# Patient Record
Sex: Female | Born: 1945 | Race: Black or African American | Hispanic: No | Marital: Single | Smoking: Never smoker
Health system: Southern US, Community
[De-identification: ages and names within clinical notes are randomized; demographics above are authoritative.]

## PROBLEM LIST (undated history)

## (undated) DIAGNOSIS — F419 Anxiety disorder, unspecified: Secondary | ICD-10-CM

## (undated) DIAGNOSIS — G8929 Other chronic pain: Secondary | ICD-10-CM

## (undated) DIAGNOSIS — C679 Malignant neoplasm of bladder, unspecified: Secondary | ICD-10-CM

## (undated) DIAGNOSIS — I10 Essential (primary) hypertension: Secondary | ICD-10-CM

## (undated) DIAGNOSIS — R519 Headache, unspecified: Secondary | ICD-10-CM

## (undated) DIAGNOSIS — F32A Depression, unspecified: Secondary | ICD-10-CM

## (undated) DIAGNOSIS — E7800 Pure hypercholesterolemia, unspecified: Secondary | ICD-10-CM

## (undated) DIAGNOSIS — H9192 Unspecified hearing loss, left ear: Secondary | ICD-10-CM

## (undated) DIAGNOSIS — L300 Nummular dermatitis: Secondary | ICD-10-CM

## (undated) DIAGNOSIS — J42 Unspecified chronic bronchitis: Secondary | ICD-10-CM

## (undated) DIAGNOSIS — R51 Headache: Secondary | ICD-10-CM

## (undated) DIAGNOSIS — M545 Low back pain, unspecified: Secondary | ICD-10-CM

## (undated) DIAGNOSIS — F329 Major depressive disorder, single episode, unspecified: Secondary | ICD-10-CM

## (undated) DIAGNOSIS — C4490 Unspecified malignant neoplasm of skin, unspecified: Secondary | ICD-10-CM

## (undated) DIAGNOSIS — Z87442 Personal history of urinary calculi: Secondary | ICD-10-CM

## (undated) DIAGNOSIS — Z9889 Other specified postprocedural states: Secondary | ICD-10-CM

## (undated) DIAGNOSIS — I82409 Acute embolism and thrombosis of unspecified deep veins of unspecified lower extremity: Secondary | ICD-10-CM

## (undated) DIAGNOSIS — J45909 Unspecified asthma, uncomplicated: Secondary | ICD-10-CM

## (undated) DIAGNOSIS — E785 Hyperlipidemia, unspecified: Secondary | ICD-10-CM

## (undated) DIAGNOSIS — M1990 Unspecified osteoarthritis, unspecified site: Secondary | ICD-10-CM

## (undated) DIAGNOSIS — K219 Gastro-esophageal reflux disease without esophagitis: Secondary | ICD-10-CM

## (undated) HISTORY — PX: BLADDER TUMOR EXCISION: SHX238

## (undated) HISTORY — PX: REPLACEMENT TOTAL KNEE BILATERAL: SUR1225

## (undated) HISTORY — DX: Other specified postprocedural states: Z98.890

## (undated) HISTORY — PX: APPENDECTOMY: SHX54

## (undated) HISTORY — PX: JOINT REPLACEMENT: SHX530

## (undated) HISTORY — PX: ABDOMINAL HYSTERECTOMY: SHX81

## (undated) HISTORY — PX: KNEE ARTHROSCOPY: SHX127

## (undated) HISTORY — PX: SKIN CANCER EXCISION: SHX779

## (undated) HISTORY — PX: BACK SURGERY: SHX140

## (undated) HISTORY — DX: Hyperlipidemia, unspecified: E78.5

## (undated) HISTORY — PX: BREAST EXCISIONAL BIOPSY: SUR124

## (undated) HISTORY — PX: BREAST BIOPSY: SHX20

## (undated) HISTORY — PX: TRANSTHORACIC ECHOCARDIOGRAM: SHX275

## (undated) HISTORY — PX: LUMBAR DISC SURGERY: SHX700

## (undated) HISTORY — PX: LEFT HEART CATH AND CORONARY ANGIOGRAPHY: CATH118249

## (undated) HISTORY — PX: CATARACT EXTRACTION: SUR2

## (undated) HISTORY — PX: REVISION TOTAL KNEE ARTHROPLASTY: SUR1280

---

## 1998-02-02 ENCOUNTER — Ambulatory Visit (HOSPITAL_COMMUNITY): Admission: RE | Admit: 1998-02-02 | Discharge: 1998-02-02 | Payer: Self-pay | Admitting: Urology

## 1998-04-22 ENCOUNTER — Ambulatory Visit (HOSPITAL_COMMUNITY): Admission: RE | Admit: 1998-04-22 | Discharge: 1998-04-22 | Payer: Self-pay | Admitting: Cardiology

## 1998-06-07 ENCOUNTER — Ambulatory Visit (HOSPITAL_COMMUNITY): Admission: RE | Admit: 1998-06-07 | Discharge: 1998-06-07 | Payer: Self-pay | Admitting: Urology

## 1998-08-04 ENCOUNTER — Encounter: Admission: RE | Admit: 1998-08-04 | Discharge: 1998-08-04 | Payer: Self-pay | Admitting: Family Medicine

## 1998-08-05 ENCOUNTER — Encounter: Admission: RE | Admit: 1998-08-05 | Discharge: 1998-08-05 | Payer: Self-pay | Admitting: Family Medicine

## 1998-08-12 ENCOUNTER — Other Ambulatory Visit: Admission: RE | Admit: 1998-08-12 | Discharge: 1998-08-12 | Payer: Self-pay | Admitting: Nephrology

## 1998-10-08 ENCOUNTER — Encounter: Payer: Self-pay | Admitting: Urology

## 1998-10-12 ENCOUNTER — Ambulatory Visit (HOSPITAL_COMMUNITY): Admission: RE | Admit: 1998-10-12 | Discharge: 1998-10-12 | Payer: Self-pay | Admitting: Urology

## 1999-01-24 ENCOUNTER — Ambulatory Visit (HOSPITAL_COMMUNITY): Admission: RE | Admit: 1999-01-24 | Discharge: 1999-01-24 | Payer: Self-pay | Admitting: Nephrology

## 1999-01-24 ENCOUNTER — Encounter: Payer: Self-pay | Admitting: Nephrology

## 1999-03-29 ENCOUNTER — Ambulatory Visit (HOSPITAL_COMMUNITY): Admission: RE | Admit: 1999-03-29 | Discharge: 1999-03-29 | Payer: Self-pay | Admitting: Gastroenterology

## 1999-04-15 ENCOUNTER — Ambulatory Visit (HOSPITAL_COMMUNITY): Admission: RE | Admit: 1999-04-15 | Discharge: 1999-04-15 | Payer: Self-pay | Admitting: Urology

## 1999-07-15 ENCOUNTER — Encounter (INDEPENDENT_AMBULATORY_CARE_PROVIDER_SITE_OTHER): Payer: Self-pay | Admitting: *Deleted

## 1999-07-15 ENCOUNTER — Ambulatory Visit (HOSPITAL_COMMUNITY): Admission: RE | Admit: 1999-07-15 | Discharge: 1999-07-15 | Payer: Self-pay | Admitting: Urology

## 1999-08-18 ENCOUNTER — Other Ambulatory Visit: Admission: RE | Admit: 1999-08-18 | Discharge: 1999-08-18 | Payer: Self-pay | Admitting: Nephrology

## 1999-08-25 ENCOUNTER — Encounter: Admission: RE | Admit: 1999-08-25 | Discharge: 1999-08-25 | Payer: Self-pay | Admitting: Nephrology

## 1999-08-25 ENCOUNTER — Encounter: Payer: Self-pay | Admitting: Nephrology

## 1999-09-05 HISTORY — PX: MEDIAL PARTIAL KNEE REPLACEMENT: SHX5965

## 1999-09-15 ENCOUNTER — Encounter: Admission: RE | Admit: 1999-09-15 | Discharge: 1999-09-15 | Payer: Self-pay | Admitting: Nephrology

## 1999-09-15 ENCOUNTER — Encounter: Payer: Self-pay | Admitting: Nephrology

## 1999-10-14 ENCOUNTER — Ambulatory Visit: Admission: RE | Admit: 1999-10-14 | Discharge: 1999-10-14 | Payer: Self-pay | Admitting: Urology

## 1999-10-14 ENCOUNTER — Encounter: Payer: Self-pay | Admitting: Urology

## 1999-11-08 ENCOUNTER — Emergency Department (HOSPITAL_COMMUNITY): Admission: EM | Admit: 1999-11-08 | Discharge: 1999-11-08 | Payer: Self-pay | Admitting: Emergency Medicine

## 1999-11-08 ENCOUNTER — Encounter (INDEPENDENT_AMBULATORY_CARE_PROVIDER_SITE_OTHER): Payer: Self-pay | Admitting: Specialist

## 1999-11-08 ENCOUNTER — Ambulatory Visit (HOSPITAL_COMMUNITY): Admission: RE | Admit: 1999-11-08 | Discharge: 1999-11-08 | Payer: Self-pay | Admitting: Urology

## 1999-11-18 ENCOUNTER — Encounter: Admission: RE | Admit: 1999-11-18 | Discharge: 1999-11-18 | Payer: Self-pay | Admitting: Gastroenterology

## 1999-11-18 ENCOUNTER — Encounter: Payer: Self-pay | Admitting: Gastroenterology

## 2000-01-12 ENCOUNTER — Ambulatory Visit (HOSPITAL_COMMUNITY): Admission: RE | Admit: 2000-01-12 | Discharge: 2000-01-12 | Payer: Self-pay | Admitting: Gastroenterology

## 2000-01-12 ENCOUNTER — Encounter: Payer: Self-pay | Admitting: Gastroenterology

## 2000-02-20 ENCOUNTER — Encounter: Payer: Self-pay | Admitting: Orthopedic Surgery

## 2000-02-27 ENCOUNTER — Encounter: Admission: RE | Admit: 2000-02-27 | Discharge: 2000-02-27 | Payer: Self-pay | Admitting: Nephrology

## 2000-02-27 ENCOUNTER — Encounter: Payer: Self-pay | Admitting: Nephrology

## 2000-02-28 ENCOUNTER — Inpatient Hospital Stay (HOSPITAL_COMMUNITY): Admission: RE | Admit: 2000-02-28 | Discharge: 2000-03-04 | Payer: Self-pay | Admitting: Orthopedic Surgery

## 2000-04-02 ENCOUNTER — Encounter: Admission: RE | Admit: 2000-04-02 | Discharge: 2000-07-01 | Payer: Self-pay | Admitting: Orthopedic Surgery

## 2000-05-04 ENCOUNTER — Encounter: Admission: RE | Admit: 2000-05-04 | Discharge: 2000-05-04 | Payer: Self-pay | Admitting: Nephrology

## 2000-05-04 ENCOUNTER — Encounter: Payer: Self-pay | Admitting: Nephrology

## 2000-06-12 ENCOUNTER — Encounter (INDEPENDENT_AMBULATORY_CARE_PROVIDER_SITE_OTHER): Payer: Self-pay | Admitting: *Deleted

## 2000-06-12 ENCOUNTER — Ambulatory Visit (HOSPITAL_COMMUNITY): Admission: RE | Admit: 2000-06-12 | Discharge: 2000-06-12 | Payer: Self-pay | Admitting: Urology

## 2000-06-12 ENCOUNTER — Encounter (INDEPENDENT_AMBULATORY_CARE_PROVIDER_SITE_OTHER): Payer: Self-pay | Admitting: Specialist

## 2000-08-30 ENCOUNTER — Other Ambulatory Visit: Admission: RE | Admit: 2000-08-30 | Discharge: 2000-08-30 | Payer: Self-pay | Admitting: Nephrology

## 2000-09-11 ENCOUNTER — Emergency Department (HOSPITAL_COMMUNITY): Admission: EM | Admit: 2000-09-11 | Discharge: 2000-09-11 | Payer: Self-pay | Admitting: Emergency Medicine

## 2000-09-11 ENCOUNTER — Encounter: Payer: Self-pay | Admitting: Emergency Medicine

## 2001-01-02 ENCOUNTER — Encounter: Payer: Self-pay | Admitting: Emergency Medicine

## 2001-01-02 ENCOUNTER — Emergency Department (HOSPITAL_COMMUNITY): Admission: EM | Admit: 2001-01-02 | Discharge: 2001-01-02 | Payer: Self-pay | Admitting: Emergency Medicine

## 2001-02-27 ENCOUNTER — Encounter: Payer: Self-pay | Admitting: Orthopedic Surgery

## 2001-02-27 ENCOUNTER — Observation Stay (HOSPITAL_COMMUNITY): Admission: RE | Admit: 2001-02-27 | Discharge: 2001-02-28 | Payer: Self-pay | Admitting: Orthopedic Surgery

## 2001-02-27 ENCOUNTER — Encounter (INDEPENDENT_AMBULATORY_CARE_PROVIDER_SITE_OTHER): Payer: Self-pay | Admitting: Specialist

## 2001-04-08 ENCOUNTER — Inpatient Hospital Stay (HOSPITAL_COMMUNITY): Admission: EM | Admit: 2001-04-08 | Discharge: 2001-04-10 | Payer: Self-pay | Admitting: Emergency Medicine

## 2001-04-08 ENCOUNTER — Encounter: Payer: Self-pay | Admitting: Emergency Medicine

## 2001-04-15 ENCOUNTER — Encounter: Payer: Self-pay | Admitting: Urology

## 2001-04-15 ENCOUNTER — Encounter: Admission: RE | Admit: 2001-04-15 | Discharge: 2001-04-15 | Payer: Self-pay | Admitting: Urology

## 2001-05-02 ENCOUNTER — Ambulatory Visit (HOSPITAL_BASED_OUTPATIENT_CLINIC_OR_DEPARTMENT_OTHER): Admission: RE | Admit: 2001-05-02 | Discharge: 2001-05-02 | Payer: Self-pay | Admitting: Urology

## 2001-05-02 ENCOUNTER — Encounter (INDEPENDENT_AMBULATORY_CARE_PROVIDER_SITE_OTHER): Payer: Self-pay | Admitting: *Deleted

## 2001-05-07 ENCOUNTER — Encounter: Payer: Self-pay | Admitting: Nephrology

## 2001-05-07 ENCOUNTER — Encounter: Admission: RE | Admit: 2001-05-07 | Discharge: 2001-05-07 | Payer: Self-pay | Admitting: Nephrology

## 2001-07-30 ENCOUNTER — Encounter: Payer: Self-pay | Admitting: Gastroenterology

## 2001-07-30 ENCOUNTER — Encounter: Admission: RE | Admit: 2001-07-30 | Discharge: 2001-07-30 | Payer: Self-pay | Admitting: Gastroenterology

## 2001-08-15 ENCOUNTER — Encounter: Payer: Self-pay | Admitting: Emergency Medicine

## 2001-08-15 ENCOUNTER — Emergency Department (HOSPITAL_COMMUNITY): Admission: EM | Admit: 2001-08-15 | Discharge: 2001-08-15 | Payer: Self-pay | Admitting: Emergency Medicine

## 2001-10-30 ENCOUNTER — Ambulatory Visit (HOSPITAL_COMMUNITY): Admission: RE | Admit: 2001-10-30 | Discharge: 2001-10-30 | Payer: Self-pay | Admitting: Gastroenterology

## 2001-11-18 ENCOUNTER — Encounter: Admission: RE | Admit: 2001-11-18 | Discharge: 2001-11-18 | Payer: Self-pay | Admitting: Nephrology

## 2001-11-18 ENCOUNTER — Encounter: Payer: Self-pay | Admitting: Nephrology

## 2001-11-21 ENCOUNTER — Other Ambulatory Visit: Admission: RE | Admit: 2001-11-21 | Discharge: 2001-11-21 | Payer: Self-pay | Admitting: Nephrology

## 2002-04-26 ENCOUNTER — Encounter: Payer: Self-pay | Admitting: Emergency Medicine

## 2002-04-26 ENCOUNTER — Emergency Department (HOSPITAL_COMMUNITY): Admission: EM | Admit: 2002-04-26 | Discharge: 2002-04-26 | Payer: Self-pay | Admitting: Emergency Medicine

## 2002-05-08 ENCOUNTER — Encounter: Payer: Self-pay | Admitting: Nephrology

## 2002-05-08 ENCOUNTER — Encounter: Admission: RE | Admit: 2002-05-08 | Discharge: 2002-05-08 | Payer: Self-pay | Admitting: Nephrology

## 2002-05-09 ENCOUNTER — Ambulatory Visit (HOSPITAL_COMMUNITY): Admission: RE | Admit: 2002-05-09 | Discharge: 2002-05-09 | Payer: Self-pay | Admitting: Orthopedic Surgery

## 2002-05-09 ENCOUNTER — Ambulatory Visit (HOSPITAL_COMMUNITY): Admission: RE | Admit: 2002-05-09 | Discharge: 2002-05-09 | Payer: Self-pay | Admitting: Urology

## 2002-05-09 ENCOUNTER — Encounter (INDEPENDENT_AMBULATORY_CARE_PROVIDER_SITE_OTHER): Payer: Self-pay | Admitting: Specialist

## 2002-06-03 ENCOUNTER — Encounter: Payer: Self-pay | Admitting: Gastroenterology

## 2002-06-03 ENCOUNTER — Encounter: Admission: RE | Admit: 2002-06-03 | Discharge: 2002-06-03 | Payer: Self-pay | Admitting: Gastroenterology

## 2002-06-17 ENCOUNTER — Encounter: Payer: Self-pay | Admitting: Orthopedic Surgery

## 2002-06-24 ENCOUNTER — Inpatient Hospital Stay (HOSPITAL_COMMUNITY): Admission: RE | Admit: 2002-06-24 | Discharge: 2002-06-26 | Payer: Self-pay | Admitting: Orthopedic Surgery

## 2002-06-24 ENCOUNTER — Encounter: Payer: Self-pay | Admitting: Orthopedic Surgery

## 2002-07-09 ENCOUNTER — Encounter: Admission: RE | Admit: 2002-07-09 | Discharge: 2002-10-07 | Payer: Self-pay | Admitting: Orthopedic Surgery

## 2003-05-12 ENCOUNTER — Encounter (INDEPENDENT_AMBULATORY_CARE_PROVIDER_SITE_OTHER): Payer: Self-pay

## 2003-05-12 ENCOUNTER — Ambulatory Visit (HOSPITAL_COMMUNITY): Admission: RE | Admit: 2003-05-12 | Discharge: 2003-05-12 | Payer: Self-pay | Admitting: Urology

## 2003-05-18 ENCOUNTER — Encounter: Payer: Self-pay | Admitting: Nephrology

## 2003-05-18 ENCOUNTER — Encounter: Admission: RE | Admit: 2003-05-18 | Discharge: 2003-05-18 | Payer: Self-pay | Admitting: Nephrology

## 2003-06-05 ENCOUNTER — Encounter: Payer: Self-pay | Admitting: Nephrology

## 2003-06-05 ENCOUNTER — Encounter: Admission: RE | Admit: 2003-06-05 | Discharge: 2003-06-05 | Payer: Self-pay | Admitting: Nephrology

## 2003-09-05 DIAGNOSIS — I2510 Atherosclerotic heart disease of native coronary artery without angina pectoris: Secondary | ICD-10-CM | POA: Insufficient documentation

## 2003-09-05 HISTORY — DX: Atherosclerotic heart disease of native coronary artery without angina pectoris: I25.10

## 2003-10-15 ENCOUNTER — Encounter: Admission: RE | Admit: 2003-10-15 | Discharge: 2003-10-15 | Payer: Self-pay | Admitting: Nephrology

## 2004-01-29 ENCOUNTER — Encounter (INDEPENDENT_AMBULATORY_CARE_PROVIDER_SITE_OTHER): Payer: Self-pay | Admitting: Specialist

## 2004-01-29 ENCOUNTER — Ambulatory Visit (HOSPITAL_COMMUNITY): Admission: RE | Admit: 2004-01-29 | Discharge: 2004-01-29 | Payer: Self-pay | Admitting: Urology

## 2004-05-25 ENCOUNTER — Encounter: Admission: RE | Admit: 2004-05-25 | Discharge: 2004-05-25 | Payer: Self-pay | Admitting: Nephrology

## 2004-06-08 ENCOUNTER — Ambulatory Visit (HOSPITAL_COMMUNITY): Admission: RE | Admit: 2004-06-08 | Discharge: 2004-06-08 | Payer: Self-pay | Admitting: Nephrology

## 2004-07-18 ENCOUNTER — Encounter (INDEPENDENT_AMBULATORY_CARE_PROVIDER_SITE_OTHER): Payer: Self-pay | Admitting: *Deleted

## 2004-07-18 ENCOUNTER — Ambulatory Visit (HOSPITAL_COMMUNITY): Admission: RE | Admit: 2004-07-18 | Discharge: 2004-07-18 | Payer: Self-pay | Admitting: Urology

## 2004-08-03 ENCOUNTER — Ambulatory Visit (HOSPITAL_COMMUNITY): Admission: RE | Admit: 2004-08-03 | Discharge: 2004-08-03 | Payer: Self-pay | Admitting: Cardiology

## 2005-01-16 ENCOUNTER — Encounter (INDEPENDENT_AMBULATORY_CARE_PROVIDER_SITE_OTHER): Payer: Self-pay | Admitting: *Deleted

## 2005-01-16 ENCOUNTER — Ambulatory Visit (HOSPITAL_COMMUNITY): Admission: RE | Admit: 2005-01-16 | Discharge: 2005-01-16 | Payer: Self-pay | Admitting: Urology

## 2005-01-19 ENCOUNTER — Encounter: Admission: RE | Admit: 2005-01-19 | Discharge: 2005-01-19 | Payer: Self-pay | Admitting: Nephrology

## 2005-02-21 ENCOUNTER — Encounter: Admission: RE | Admit: 2005-02-21 | Discharge: 2005-02-21 | Payer: Self-pay | Admitting: Nephrology

## 2005-05-14 ENCOUNTER — Emergency Department (HOSPITAL_COMMUNITY): Admission: EM | Admit: 2005-05-14 | Discharge: 2005-05-14 | Payer: Self-pay | Admitting: Emergency Medicine

## 2005-05-18 ENCOUNTER — Encounter: Admission: RE | Admit: 2005-05-18 | Discharge: 2005-05-18 | Payer: Self-pay | Admitting: Nephrology

## 2005-05-19 ENCOUNTER — Encounter: Admission: RE | Admit: 2005-05-19 | Discharge: 2005-05-19 | Payer: Self-pay | Admitting: Nephrology

## 2005-05-30 ENCOUNTER — Encounter: Admission: RE | Admit: 2005-05-30 | Discharge: 2005-05-30 | Payer: Self-pay | Admitting: Nephrology

## 2005-06-03 ENCOUNTER — Emergency Department (HOSPITAL_COMMUNITY): Admission: EM | Admit: 2005-06-03 | Discharge: 2005-06-03 | Payer: Self-pay | Admitting: Emergency Medicine

## 2005-06-08 ENCOUNTER — Emergency Department (HOSPITAL_COMMUNITY): Admission: EM | Admit: 2005-06-08 | Discharge: 2005-06-08 | Payer: Self-pay | Admitting: Emergency Medicine

## 2005-08-02 ENCOUNTER — Ambulatory Visit (HOSPITAL_COMMUNITY): Admission: RE | Admit: 2005-08-02 | Discharge: 2005-08-02 | Payer: Self-pay | Admitting: Neurology

## 2005-10-16 ENCOUNTER — Encounter: Admission: RE | Admit: 2005-10-16 | Discharge: 2005-10-16 | Payer: Self-pay | Admitting: Nephrology

## 2005-11-07 ENCOUNTER — Inpatient Hospital Stay (HOSPITAL_COMMUNITY): Admission: RE | Admit: 2005-11-07 | Discharge: 2005-11-12 | Payer: Self-pay | Admitting: Orthopedic Surgery

## 2005-12-11 ENCOUNTER — Encounter: Admission: RE | Admit: 2005-12-11 | Discharge: 2006-03-11 | Payer: Self-pay | Admitting: Orthopedic Surgery

## 2006-03-12 ENCOUNTER — Encounter: Admission: RE | Admit: 2006-03-12 | Discharge: 2006-06-10 | Payer: Self-pay | Admitting: Orthopedic Surgery

## 2006-04-27 ENCOUNTER — Encounter: Admission: RE | Admit: 2006-04-27 | Discharge: 2006-04-27 | Payer: Self-pay | Admitting: Nephrology

## 2006-06-01 ENCOUNTER — Encounter: Admission: RE | Admit: 2006-06-01 | Discharge: 2006-06-01 | Payer: Self-pay | Admitting: Nephrology

## 2006-06-14 ENCOUNTER — Encounter: Admission: RE | Admit: 2006-06-14 | Discharge: 2006-06-14 | Payer: Self-pay | Admitting: *Deleted

## 2006-06-18 ENCOUNTER — Encounter: Admission: RE | Admit: 2006-06-18 | Discharge: 2006-06-18 | Payer: Self-pay | Admitting: *Deleted

## 2006-06-25 ENCOUNTER — Other Ambulatory Visit: Admission: RE | Admit: 2006-06-25 | Discharge: 2006-06-25 | Payer: Self-pay | Admitting: Nephrology

## 2006-07-23 ENCOUNTER — Encounter: Admission: RE | Admit: 2006-07-23 | Discharge: 2006-07-23 | Payer: Self-pay | Admitting: Orthopedic Surgery

## 2006-08-08 ENCOUNTER — Ambulatory Visit (HOSPITAL_COMMUNITY): Admission: RE | Admit: 2006-08-08 | Discharge: 2006-08-08 | Payer: Self-pay | Admitting: Gastroenterology

## 2006-08-08 ENCOUNTER — Encounter (INDEPENDENT_AMBULATORY_CARE_PROVIDER_SITE_OTHER): Payer: Self-pay | Admitting: Specialist

## 2006-10-22 ENCOUNTER — Encounter (INDEPENDENT_AMBULATORY_CARE_PROVIDER_SITE_OTHER): Payer: Self-pay | Admitting: *Deleted

## 2006-10-22 ENCOUNTER — Ambulatory Visit (HOSPITAL_COMMUNITY): Admission: RE | Admit: 2006-10-22 | Discharge: 2006-10-22 | Payer: Self-pay | Admitting: Urology

## 2007-03-22 ENCOUNTER — Encounter: Admission: RE | Admit: 2007-03-22 | Discharge: 2007-03-22 | Payer: Self-pay | Admitting: Gastroenterology

## 2007-04-01 ENCOUNTER — Ambulatory Visit (HOSPITAL_COMMUNITY): Admission: RE | Admit: 2007-04-01 | Discharge: 2007-04-01 | Payer: Self-pay | Admitting: Gastroenterology

## 2007-06-04 ENCOUNTER — Encounter: Admission: RE | Admit: 2007-06-04 | Discharge: 2007-06-04 | Payer: Self-pay | Admitting: Nephrology

## 2007-07-04 ENCOUNTER — Other Ambulatory Visit: Admission: RE | Admit: 2007-07-04 | Discharge: 2007-07-04 | Payer: Self-pay | Admitting: Nephrology

## 2007-09-05 HISTORY — PX: PARTIAL KNEE ARTHROPLASTY: SHX2174

## 2007-10-11 ENCOUNTER — Encounter: Admission: RE | Admit: 2007-10-11 | Discharge: 2007-10-11 | Payer: Self-pay | Admitting: Nephrology

## 2007-11-14 ENCOUNTER — Ambulatory Visit (HOSPITAL_COMMUNITY): Admission: RE | Admit: 2007-11-14 | Discharge: 2007-11-14 | Payer: Self-pay | Admitting: Gastroenterology

## 2008-02-11 ENCOUNTER — Encounter: Admission: RE | Admit: 2008-02-11 | Discharge: 2008-02-11 | Payer: Self-pay | Admitting: Nephrology

## 2008-05-25 ENCOUNTER — Ambulatory Visit (HOSPITAL_COMMUNITY): Admission: RE | Admit: 2008-05-25 | Discharge: 2008-05-25 | Payer: Self-pay | Admitting: Orthopedic Surgery

## 2008-06-05 ENCOUNTER — Encounter: Admission: RE | Admit: 2008-06-05 | Discharge: 2008-06-05 | Payer: Self-pay | Admitting: Nephrology

## 2008-08-13 ENCOUNTER — Inpatient Hospital Stay (HOSPITAL_COMMUNITY): Admission: RE | Admit: 2008-08-13 | Discharge: 2008-08-15 | Payer: Self-pay | Admitting: Orthopedic Surgery

## 2008-10-26 ENCOUNTER — Encounter: Admission: RE | Admit: 2008-10-26 | Discharge: 2008-10-26 | Payer: Self-pay | Admitting: Nephrology

## 2008-10-27 ENCOUNTER — Encounter: Admission: RE | Admit: 2008-10-27 | Discharge: 2008-10-27 | Payer: Self-pay | Admitting: Nephrology

## 2008-11-20 ENCOUNTER — Encounter (INDEPENDENT_AMBULATORY_CARE_PROVIDER_SITE_OTHER): Payer: Self-pay | Admitting: Urology

## 2008-11-20 ENCOUNTER — Ambulatory Visit (HOSPITAL_COMMUNITY): Admission: RE | Admit: 2008-11-20 | Discharge: 2008-11-20 | Payer: Self-pay | Admitting: Urology

## 2009-02-03 ENCOUNTER — Emergency Department (HOSPITAL_COMMUNITY): Admission: EM | Admit: 2009-02-03 | Discharge: 2009-02-03 | Payer: Self-pay | Admitting: Emergency Medicine

## 2009-02-05 ENCOUNTER — Encounter: Admission: RE | Admit: 2009-02-05 | Discharge: 2009-02-05 | Payer: Self-pay | Admitting: Orthopedic Surgery

## 2009-05-12 ENCOUNTER — Observation Stay (HOSPITAL_COMMUNITY): Admission: AD | Admit: 2009-05-12 | Discharge: 2009-05-15 | Payer: Self-pay | Admitting: Nephrology

## 2009-06-15 ENCOUNTER — Encounter: Admission: RE | Admit: 2009-06-15 | Discharge: 2009-06-15 | Payer: Self-pay | Admitting: Nephrology

## 2009-09-23 ENCOUNTER — Encounter: Admission: RE | Admit: 2009-09-23 | Discharge: 2009-09-23 | Payer: Self-pay | Admitting: Nephrology

## 2009-12-20 ENCOUNTER — Encounter: Admission: RE | Admit: 2009-12-20 | Discharge: 2009-12-20 | Payer: Self-pay | Admitting: Gastroenterology

## 2010-04-14 ENCOUNTER — Encounter: Admission: RE | Admit: 2010-04-14 | Discharge: 2010-04-14 | Payer: Self-pay | Admitting: Nephrology

## 2010-05-13 ENCOUNTER — Emergency Department (HOSPITAL_COMMUNITY): Admission: EM | Admit: 2010-05-13 | Discharge: 2010-05-13 | Payer: Self-pay | Admitting: Emergency Medicine

## 2010-06-17 ENCOUNTER — Encounter: Admission: RE | Admit: 2010-06-17 | Discharge: 2010-06-17 | Payer: Self-pay | Admitting: Nephrology

## 2010-09-25 ENCOUNTER — Encounter: Payer: Self-pay | Admitting: Nephrology

## 2010-10-12 ENCOUNTER — Ambulatory Visit (HOSPITAL_COMMUNITY)
Admission: RE | Admit: 2010-10-12 | Discharge: 2010-10-12 | Disposition: A | Discharge status: Home / Self Care | Payer: Medicare Other | Source: Ambulatory Visit | Attending: Cardiology | Admitting: Cardiology

## 2010-10-12 DIAGNOSIS — R9439 Abnormal result of other cardiovascular function study: Secondary | ICD-10-CM | POA: Insufficient documentation

## 2010-10-12 DIAGNOSIS — R079 Chest pain, unspecified: Secondary | ICD-10-CM | POA: Insufficient documentation

## 2010-10-12 LAB — BASIC METABOLIC PANEL
BUN: 8 mg/dL (ref 6–23)
CO2: 28 mEq/L (ref 19–32)
Chloride: 107 mEq/L (ref 96–112)
Creatinine, Ser: 0.79 mg/dL (ref 0.4–1.2)

## 2010-10-12 LAB — CBC
MCH: 29.6 pg (ref 26.0–34.0)
MCV: 90 fL (ref 78.0–100.0)
Platelets: 335 10*3/uL (ref 150–400)
RDW: 13.6 % (ref 11.5–15.5)

## 2010-10-19 NOTE — Procedures (Signed)
  NAMEAREANNA, GENGLER NO.:  [id number]  MEDICAL RECORD NO.:  [id number]           PATIENT TYPE:  O  LOCATION:  [facility]                         FACILITY:  [facility]  PHYSICIAN:  Ritta Slot, MD     DATE OF BIRTH:  [birth year]  DATE OF PROCEDURE:  10/12/2010 DATE OF DISCHARGE:  10/12/2010                           CARDIAC CATHETERIZATION   The is a left heart catheterization.  INDICATIONS:  [name] is a very pleasant 65 year old African American female who presents with chest discomfort and heaviness in the left chest radiating down the left with an abnormal nuclear perfusion stress test.  She was therefore brought to the cardiac cath lab for further evaluation of her coronary anatomy.  After informed consent, the patient was placed in supine position.  Her right wrist was prepped and draped in the usual sterile fashion.  Using the modified Seldinger technique, the right radial artery was accessed using a 5-French sheath.  She received 1 mg of Versed and 50 mcg of fentanyl and a cocktail of 400 mcg of nitroglycerin, 4 mg of Versed, 2 mL of lidocaine diluted down with 2 mL of saline to 10 mL cocktail. Through the 5-French sheath was placed a JL-3.5 catheter.  JL-3.5 catheter was actually used to engage into both the RCA and LCA without difficulty.  Visualization of the RCA and the LCA was obtained in AP cranial, AP caudal, RAO cranial, RAO caudal views.  The JL-3.5 catheter was then exchanged over exchange wire for a straight pigtail.  The straight pigtail was placed in LV without difficulty.  Visualization of the LV was performed using 36 mL of contrast at 12 mL per second.  FINDINGS:  AO pressure 130/75, mean 97.  LV pressure 160/-5, LVEDP of 12.  LV pullback 155/9, LVEDP of 14.  AO pullback 154/78, mean of 110. There is no gradient across the aortic valve by pullback technique.  The right coronary artery was found to be dominant giving rise to PDA  and PLA.  There is no significant disease.  The left main bifurcated into an LAD and circumflex.  There is no significant disease in the left main LAD territory or left circumflex territory.  Left ventricular function was found to be 65%, no regional wall motion abnormalities.  No mitral regurgitation.  FINAL CONCLUSIONS: 1. Normal coronary arteries. 2. Normal left ventriculogram.  Continue medical therapy, discharge home, follow with me in 2 weeks.     Ritta Slot, MD     HS/MEDQ  D:  10/12/2010  T:  10/13/2010  Job:  [id number]  Electronically Signed by Ritta Slot MD on 10/19/2010 01:47:30 PM

## 2010-12-09 LAB — URINALYSIS, ROUTINE W REFLEX MICROSCOPIC
Bilirubin Urine: NEGATIVE
Bilirubin Urine: NEGATIVE
Glucose, UA: NEGATIVE mg/dL
Specific Gravity, Urine: 1.005 (ref 1.005–1.030)
Specific Gravity, Urine: 1.011 (ref 1.005–1.030)
pH: 6.5 (ref 5.0–8.0)
pH: 7 (ref 5.0–8.0)

## 2010-12-09 LAB — LIPID PANEL
Cholesterol: 128 mg/dL (ref 0–200)
LDL Cholesterol: 69 mg/dL (ref 0–99)
Total CHOL/HDL Ratio: 2.9 RATIO

## 2010-12-09 LAB — URINE MICROSCOPIC-ADD ON

## 2010-12-09 LAB — GLUCOSE, CAPILLARY
Glucose-Capillary: 105 mg/dL — ABNORMAL HIGH (ref 70–99)
Glucose-Capillary: 132 mg/dL — ABNORMAL HIGH (ref 70–99)
Glucose-Capillary: 85 mg/dL (ref 70–99)
Glucose-Capillary: 87 mg/dL (ref 70–99)
Glucose-Capillary: 88 mg/dL (ref 70–99)
Glucose-Capillary: 89 mg/dL (ref 70–99)

## 2010-12-09 LAB — CBC
HCT: 34.4 % — ABNORMAL LOW (ref 36.0–46.0)
Hemoglobin: 11.4 g/dL — ABNORMAL LOW (ref 12.0–15.0)
MCHC: 33.3 g/dL (ref 30.0–36.0)
MCV: 92.2 fL (ref 78.0–100.0)
MCV: 92.6 fL (ref 78.0–100.0)
Platelets: 350 10*3/uL (ref 150–400)
Platelets: 356 10*3/uL (ref 150–400)
RBC: 3.73 MIL/uL — ABNORMAL LOW (ref 3.87–5.11)
WBC: 11.3 10*3/uL — ABNORMAL HIGH (ref 4.0–10.5)
WBC: 9.1 10*3/uL (ref 4.0–10.5)

## 2010-12-09 LAB — DIFFERENTIAL
Eosinophils Absolute: 0.5 10*3/uL (ref 0.0–0.7)
Eosinophils Relative: 4 % (ref 0–5)
Eosinophils Relative: 5 % (ref 0–5)
Lymphocytes Relative: 30 % (ref 12–46)
Lymphocytes Relative: 32 % (ref 12–46)
Lymphocytes Relative: 38 % (ref 12–46)
Lymphs Abs: 2.9 10*3/uL (ref 0.7–4.0)
Lymphs Abs: 3.8 10*3/uL (ref 0.7–4.0)
Lymphs Abs: 4.3 10*3/uL — ABNORMAL HIGH (ref 0.7–4.0)
Monocytes Absolute: 0.7 10*3/uL (ref 0.1–1.0)
Monocytes Relative: 6 % (ref 3–12)
Neutro Abs: 7.6 10*3/uL (ref 1.7–7.7)
Neutrophils Relative %: 53 % (ref 43–77)

## 2010-12-09 LAB — HEMOGLOBIN A1C
Hgb A1c MFr Bld: 5.4 % (ref 4.6–6.1)
Mean Plasma Glucose: 108 mg/dL

## 2010-12-09 LAB — COMPREHENSIVE METABOLIC PANEL
AST: 28 U/L (ref 0–37)
Albumin: 3.7 g/dL (ref 3.5–5.2)
CO2: 28 mEq/L (ref 19–32)
Calcium: 9.1 mg/dL (ref 8.4–10.5)
Creatinine, Ser: 0.78 mg/dL (ref 0.4–1.2)
GFR calc Af Amer: >60 mL/min (ref >60)
GFR calc non Af Amer: >60 mL/min (ref >60)

## 2010-12-09 LAB — IRON AND TIBC
Iron: 32 ug/dL — ABNORMAL LOW (ref 42–135)
Saturation Ratios: 10 % — ABNORMAL LOW (ref 20–55)
TIBC: 308 ug/dL (ref 250–470)
UIBC: 276 ug/dL

## 2010-12-09 LAB — URINE CULTURE

## 2010-12-09 LAB — RENAL FUNCTION PANEL
Albumin: 3.3 g/dL — ABNORMAL LOW (ref 3.5–5.2)
BUN: 6 mg/dL (ref 6–23)
BUN: 6 mg/dL (ref 6–23)
CO2: 29 mEq/L (ref 19–32)
Calcium: 8.6 mg/dL (ref 8.4–10.5)
Chloride: 103 mEq/L (ref 96–112)
Creatinine, Ser: 0.79 mg/dL (ref 0.4–1.2)
Phosphorus: 3.7 mg/dL (ref 2.3–4.6)
Potassium: 2.7 mEq/L — CL (ref 3.5–5.1)
Potassium: 3.1 mEq/L — ABNORMAL LOW (ref 3.5–5.1)

## 2010-12-09 LAB — FERRITIN: Ferritin: 137 ng/mL (ref 10–291)

## 2010-12-09 LAB — APTT: aPTT: 24 seconds (ref 24–37)

## 2010-12-09 LAB — PROTIME-INR
INR: 1 (ref 0.00–1.49)
Prothrombin Time: 13.5 seconds (ref 11.6–15.2)

## 2010-12-15 LAB — BASIC METABOLIC PANEL
CO2: 29 mEq/L (ref 19–32)
CO2: 29 mEq/L (ref 19–32)
Calcium: 9.6 mg/dL (ref 8.4–10.5)
Chloride: 103 mEq/L (ref 96–112)
Creatinine, Ser: 0.66 mg/dL (ref 0.4–1.2)
GFR calc Af Amer: >60 mL/min (ref >60)
GFR calc Af Amer: >60 mL/min (ref >60)
GFR calc non Af Amer: >60 mL/min (ref >60)
Glucose, Bld: 104 mg/dL — ABNORMAL HIGH (ref 70–99)
Potassium: 4.6 mEq/L (ref 3.5–5.1)
Sodium: 141 mEq/L (ref 135–145)
Sodium: 139 mEq/L (ref 135–145)

## 2010-12-15 LAB — HEMOGLOBIN AND HEMATOCRIT, BLOOD
HCT: 37.5 % (ref 36.0–46.0)
HCT: 36.2 % (ref 36.0–46.0)
Hemoglobin: 11.7 g/dL — ABNORMAL LOW (ref 12.0–15.0)

## 2011-01-17 NOTE — Op Note (Signed)
Peggy [name], Peggy NO.:  [id number]   MEDICAL RECORD NO.:  [id number]          PATIENT TYPE:  INP   LOCATION:  5025                         FACILITY:  [facility]   PHYSICIAN:  Madlyn Frankel. Charlann Boxer, M.D.  DATE OF BIRTH:  12-01-1945   DATE OF PROCEDURE:  08/13/2008  DATE OF DISCHARGE:                               OPERATIVE REPORT   PREOPERATIVE DIAGNOSIS:  Failed left total knee replacement.   POSTOPERATIVE DIAGNOSIS:  Failed left total knee replacement.   FINDINGS:  Intraoperatively, her joint fluid appeared to be completely  normal, it was sent off for stat Gram-stain culture as well as cell  count.  At the time of the procedure, Gram stain came back negative with  rear mononucleotide seen.   PROCEDURE:  Revision, left total knee replacement.   COMPONENTS USED:  DePuy knee system with size 2.5 standard femur for the  left with a 4-mm posterior medial augment.  I used a size 3 MBT revision  tray with a 5-mm medial augment.  I used a 15-mm standard posterior  stabilized insert to match the 2.5 femur.   SURGEON:  Madlyn Frankel. Charlann Boxer, MD   ASSISTANT:  Yetta Glassman. Mann, PA   ANESTHESIA:  General plus preoperative femoral regional block.   BLOOD LOSS:  Approximately 300 mL.   TOURNIQUET TIME:  Only 39 minutes at 250 mmHg.  The initial tourniquet  time was complicated by venous tourniquet.  I let the tourniquet down  for the vast majority of the case other than the initial 20 minutes and  then cementing the components into place.   DRAINS:  One Hemovac.   COMPLICATIONS:  None.   INDICATIONS OF PROCEDURE:  [name] is a 66 year old female referred  for surgical consideration of a failed left knee.  She had an index  unicompartmental knee replacement that failed and subsequent revision to  a total knee replacement.  This has subsequently failed with loosening  at the proximal tibia.  Work  up for infection, and the results were  fairly equivocal with a normal sed  rate, slightly elevated CRP, and a  bone scan that indicated concerns for loosening, not infection.  Given  these findings of the persistent pain, she wished to proceed with  arthroplasty.  I reviewed the risks and potential recurrence of  infection, the potential for infection or findings, and conversion into  a resected arthroplasty, and replacement of the joint at a later date  versus the standard risks of DVT, component failure, stiffness, and need  for revision surgery.  Consent was obtained for this procedure.   PROCEDURE IN DETAIL:  The patient was brought to the operative theater.  Once adequate anesthesia and preoperative antibiotics initially  administered, which was clindamycin, the patient was positioned supine.  Proximal thigh tourniquet was placed.  The left lower extremity was then  breached, prepped and draped in a sterile fashion.  Time-out was  performed identifying the patient and the extremity.   The leg was initially exsanguinated and the tourniquet elevated.  Midline incision was made through the patient's old  incision excising  old scar.  Sharp tissue planes created.  Arthrotomy was created.  At  this point, normal joint fluid was encountered and sent it out for  evaluation.   Following initial debridement, examination of the tibial compartment  revealed that it was obviously loose.  At this point, I exposed the  proximal tibia enough for the femoral component in place to make sure I  was going to be able to get it out without difficulty.  I then attended  to the femur.  The femoral component was removed using a combination of  oscillating saw and osteotomes.  Minimal bone  loss was encountered with  this.   Following removal of the femoral component, I subluxated the tibia  anteriorly and removed the tibial component without difficulty.  At this  point, it was noted to have a varus placement of the femur and the  tibia.  Using an oscillating saw, I created a  flatter surface,  identifying a perpendicular plane with a new alignment rod.  This  required use of a 5-mm augment.  The bone was prepared medial to this.  I hand reamed to a 15 cancellous and used a 13-mm stem.   Once I was very comfortable that the cut surface now with a 5-mm augment  and was perpendicular in both planes, I went ahead and placed a trial  tray and drilled for a MBT revision tray.  At this point, I placed an  MBT revision tray with 5-mm medial augment.   Attention was now directed to the femur.  I went ahead and placed an  intramedullary rod with a cylindrical piece with the aperture at the  distal femur.  I checked to make sure there was no bone that need to be  resected at 3 degrees of valgus and there was none.  I then placed the  anterior and posterior cutting block for a size 2.5 femur, as this was  what was removed before.  Based on my determination to maintain external  rotation, I needed a 4-mm augment.  A freshen-up cut was made medially,  none laterally.   Based on this and final box cut, I went ahead and did a trial reduction  and 2.5 femur with this posterior medial augment, a size 3 tibial tray.  It was noted that the knee came out to full extension with 12.5, may be  a little bit hyperextension.  This is important for the final component  choice.  With the knee in full extension, I went ahead and debrided  around the patella maintaining patellar component.   At this point following debridement with trial reduction, I went ahead  and re-exsanguinated the leg and elevated the tourniquet.  We removed  the trial components and irrigated the knee copiously with normal saline  solution.   At that time, we noted that the culture results were negative for any  active or obvious infection.  Final components were opened and cement  mixed.  I cemented the components into position and brought the knee up  in extension with actually 15-mm insert.  Extruded cement  was removed  with cement fully had cured.  I removed the remaining cement throughout  the knee and placed a final 15-mm insert to match the 2.5 femur.  The  patella was noted to track without application of pressure.  Knee came  to extension and had good flexion.  We re-irrigated the knee with normal  saline solution.  I  then reapproximated the extensor mechanism with the  knee in flexion, then  reapproximated remainder of the portion of the wound with 2-0 Vicryl,  and staples on the skin.  Skin was cleaned, dried, and dressed sterilely  with sterile bulky Jones dressing.  She was brought to the recovery room  in stable condition.  Tolerated the procedure well.      Madlyn Frankel Charlann Boxer, M.D.  Electronically Signed     MDO/MEDQ  D:  08/13/2008  T:  08/14/2008  Job:  [id number]

## 2011-01-17 NOTE — H&P (Signed)
Peggy [name], Peggy [name] NO.:  [id number]   MEDICAL RECORD NO.:  [id number]          PATIENT TYPE:  INP   LOCATION:                               FACILITY:  [facility]   PHYSICIAN:  Madlyn Frankel. Charlann Boxer, M.D.  DATE OF BIRTH:  1946/06/10   DATE OF ADMISSION:  08/13/2008  DATE OF DISCHARGE:                              HISTORY & PHYSICAL   PROCEDURE:  Revision resection of the left total knee replacement.   CHIEF COMPLAINTS:  Left knee pain with a failed left total knee  replacement.   HISTORY OF PRESENT ILLNESS:  A 65 year old female with a history of left  knee pain with a failed loose left total knee replacement which has been  refractory to all conservative treat.  There is also high clinical  suspicion for infection.   PAST MEDICAL HISTORY:  Significant for  1. Osteoarthritis.  2. Hypertension.  3. Hypercholesteremia.  4. Reflux disease.  5. Bladder cancer.  6. Skin cancer.  7. Degenerative disk disease.   PREVIOUS SURGERIES:  Right knee replacement 2001, back surgery 2002,  left knee replacement as well as revision surgery in 2003 and then  subsequently 2007.   FAMILY HISTORY:  Heart disease and cancer.   SOCIAL HISTORY:  She is disabled, does have a caregiver lined up  postoperatively in the home.   DRUG ALLERGIES:  No known drug allergies.   PRIMARY CARE PHYSICIAN:  Jarome Matin, MD   OTHER PHYSICIANS:  Lindaann Slough, MD   CURRENT MEDICATIONS:  1. Lipitor 40 mg p.o. daily.  2. Bethanechol 50 mg q.i.d.  3. Mavik 2 mg p.o. daily.  4. Aspirin 81 mg p.o. daily.   REVIEW OF SYSTEMS:  HEENT: She has hearing loss.  She wears dentures.  CARDIOVASCULAR:  Last reported EKG is July 07, 2008.  GENITOURINARY:  She has urinary retention.  Otherwise see HPI.   PHYSICAL EXAMINATION:  VITAL SIGNS:  Pulse 72, respirations 16, blood  pressure 126/68.  GENERAL:  Awake, alert and oriented, well-developed, well-nourished in  no acute distress.  HEENT:  Normocephalic.  NECK:  Supple.  No carotid bruits.  CHEST/LUNGS:  Clear to auscultation bilaterally.  BREASTS:  Deferred.  HEART:  Regular rate and rhythm.  S1-S2 distinct.  ABDOMEN:  Soft, nontender, and nondistended.  Bowel sounds present.  PELVIS:  Stable.  GENITOURINARY:  Deferred.  EXTREMITIES:  Left knee is painful and swollen.  SKIN:  No cellulitis.  NEUROLOGIC:  Intact distal sensibilities.   RADIOLOGY:  Previous bone scan showed abnormal left knee with probable  prosthetic loosening with potential suspicion for infection.   LABORATORY DATA:  Previous sed rates was 31 and CRP was 6.   All other laboratory data is pending presurgical testing.   IMPRESSION:  Failed left total knee replacement with evidence of  loosening and clinical suspicion for infection.   PLAN OF ACTION:  Revision resection left total knee replacement at [facility] by surgeon Dr. Durene Romans.  Risks and complications were  discussed.   Postoperative medications were provided at the time of history  and  physical including aspirin, Robaxin, iron, Colace, MiraLax and Celebrex.  Pain medicines to be dispensed at the time of surgery.     ______________________________  Yetta Glassman [address], [place]      Madlyn Frankel. Charlann Boxer, M.D.  Electronically Signed    BLM/MEDQ  D:  08/12/2008  T:  08/13/2008  Job:  [id number]   cc:   Jarome Matin, M.D.  Lindaann Slough, M.D.

## 2011-01-17 NOTE — Op Note (Signed)
Peggy [name], Peggy [name]                 ACCOUNT NO.:  [id number]   MEDICAL RECORD NO.:  [id number]          PATIENT TYPE:  AMB   LOCATION:  DAY                          FACILITY:  [facility]   PHYSICIAN:  Lindaann Slough, M.D.  DATE OF BIRTH:  Jul 18, 1946   DATE OF PROCEDURE:  11/20/2008  DATE OF DISCHARGE:                               OPERATIVE REPORT   PREOPERATIVE DIAGNOSES:  Rule out recurrent bladder tumor.   POSTPROCEDURE DIAGNOSES:  No recurrent bladder tumor.   PROCEDURE:  Cystoscopy, bladder biopsy.   SURGEON:  Dr. Brunilda Payor.   ANESTHESIA:  General.   INDICATIONS:  The patient is a 65 year old female who had TUR bladder  tumor in 1997, and she has not had any recurrence since.  She last had  cystoscopy in February 2008.  She is scheduled today for cystoscopy and  bladder biopsy.   DESCRIPTION OF PROCEDURE:  The patient was identified by her wrist band  and proper timeout was taken.   Under general anesthesia, she was prepped and draped and placed in the  dorsal lithotomy position.  A panendoscope was inserted in the bladder.  The bladder mucosa is normal.  There is no stone or tumor in the  bladder.  The ureteral orifices are in normal position and shape with  clear efflux.  Cystoscopy was done with the 12 degree and 70 degree  lenses.  Then a random bladder biopsy was done.  The areas of biopsy  where then fulgurated with the Bugbee electrode.  There was no evidence  of bleeding at the end of the procedure.  The cystoscope was removed.  A  #16 Foley catheter was inserted in the bladder.   The patient tolerated the procedure well and left the OR in satisfactory  condition to postanesthesia care unit.      Lindaann Slough, M.D.  Electronically Signed     MN/MEDQ  D:  11/20/2008  T:  11/20/2008  Job:  [id number]   cc:   Jarome Matin, M.D.  Fax: [phone]

## 2011-01-20 NOTE — Discharge Summary (Signed)
[facility]  Patient:    Peggy [name], Peggy [name]                        MRN: [id number] Adm. Date:  [id number] Disc. Date: [id number] Attending:  Skip Mayer Dictator:   Alexzandrew L. Perkins, P.A.-C.                           Discharge Summary  ADMISSION DIAGNOSIS: 1. End-stage osteoarthritis, right knee. 2. Hypercholesterolemia. 3. History of bronchitis. 4. History of migraine headaches. 5. History of kidney stones. 6. Malignant bladder tumors.  DISCHARGE DIAGNOSES: 1. Severe degenerative arthritis, right knee, status post right total knee    replacement arthroplasty. 2. Postoperative hemorrhagic anemia. 3. Status post transfusion without sequelae. 4. Urinary retention postoperatively. 5. Hypercholesterolemia. 6. History of bronchitis. 7. History of migraine headaches. 8. History of kidney stones. 9. Malignant bladder tumors.  PROCEDURE:  The patient was taken to the OR on February 28, 2000, and underwent right total knee replacement arthroplasty.  Surgeon: Dr. Worthy Rancher. Assistant: Dr. Debria Garret.  Components used were Osteonics posterior cruciate sacrificing system.  Sizes were a 26 mm patella, size 9 right posterior stabilizer femoral component with a size 7 tibial tray and a 12 mm tibial bearing insert.  All components were cemented.  Surgery done under general anesthesia.  BRIEF HISTORY:  The patient is a 65 year old female with a longstanding history of bilateral knee pain, right worse than left.  She has undergone conservative treatment in the past with multiple steroid injection, dosepaks, activity modification, as well as arthroscopic debridement. X-rays in the office show severe osteoarthritis of the right knee.  Pain has started to interfere with her daily activities, and she wishes to proceed with surgery at this time.  The risks and benefits of the procedure were discussed with the patient, and she would like to undergo  surgery.  LABORATORY DATA:  CBC on admission: Hemoglobin 9.2, hematocrit 30.5, white cell count 8.6, red cell count 3.46.  Hemoglobin and hematocrit were followed very closely.  Hemoglobin dropped to a level of 8.0, hematocrit to 25.9.  The patient was transfused with 4 units of blood.  Post transfusion hemoglobin was back up to 10.3, hematocrit 29.6.  Differential showed neutrophils 42, lymphs 49, monos 4, eos 3, basophils 1, leukocyte 1.  PT/PTT on admission were 13.8 and 24, respectively, with an INR of 1.1.  Serial pro times were followed up with Coumadin protocol.  Last reported PT/INR 19.0 and 2.1, therapeutic level. Chemistry panel on admission: Slightly low albumin of 3.4, remaining chemistry panel all within normal limits.  Urinalysis on admission showed trace ketones. Followup UA on March 01, 2000, was all negative.  Blood group type A positive.  Knee films taken on February 20, 2000: Medial compartment narrowing consistent with osteoarthritis.  Chest x-ray: Thoracic osteophytosis, no active disease.  I do not see an EKG report on this chart at this time.  HOSPITAL COURSE:  The patient was admitted to [facility], taken to the OR, and underwent the above-stated procedure without complication. Hemovac drain was placed at the time of surgery.  This was pulled on postop day #1 without difficulty.  Hemoglobin and hematocrit were followed very closely.  Postoperative hemoglobin was noted at a level of 8.0.  She was transfused 2 units of packed cells.  Hemoglobin came back up to a level of 8.9, dropped back down to  8.0.  Throughout the hospital course, she was given 2 further units of blood.  Hemoglobin came back up to 10.3, hematocrit 29.6. The patient was placed on PCA analgesics for pain control following surgery. She was weaned off PCA analgesics by postop day #2.  Foley was also discontinued.  She had difficulty voiding and required in and out catheterization.  Urecholine  was started on postop day #3.  PCA and IV analgesics were discontinued.  Physical therapy and occupational therapy were consulted to assist with gait training ambulation. The patient was initially slow to progress with physical therapy, only ambulating 25 feet by postop day #2; however, she started to progress well and started ambulating 160 feet by postop day #3 and greater than 200 feet by postop day #4.  Wound care and wound observation were followed throughout the hospital course.  The incision was healing well.  She was weightbearing as tolerated to the right lower extremity.  She continued to progress.  She did have some mild serosanguineous drainage noted from the incision, however, no acute signs of infection.  She continued to progress well with physical therapy, and by March 04, 2000, she was stable for discharge. Due to the fact that she still had some serous drainage, she was placed on Keflex at time of discharge.  DISCHARGE PLAN:  The patient was discharged home on March 04, 2000.  DISCHARGE DIAGNOSES:  Same as above.  DISCHARGE MEDICATIONS: 1. Percocet p.r.n. pain. 2. Coumadin as per pharmacy protocol. 3. Keflex q.i.d.  DIET:  As tolerated.  ACTIVITY:  Weightbearing as tolerated to the right lower extremity.  Continue with gait training ambulation physical therapy.  FOLLOWUP:  Two weeks from date of surgery.  DISPOSITION:  Home.  CONDITION ON DISCHARGE:  Improving. DD:  03/28/00 TD:  03/30/00 Job: [id number]

## 2011-01-20 NOTE — Op Note (Signed)
[facility]  Patient:    Peggy [name], Peggy [name]                        MRN: [id number] Proc. Date: 02/28/00 Adm. Date:  [id number] Attending:  Skip Mayer Dictator:   Georges Lynch Darrelyn Hillock, M.D. CC:         Georges Lynch. Darrelyn Hillock, M.D.                           Operative Report  SURGEON:  Dr. Darrelyn Hillock.  ASSISTANT:  Dr. Ronnell Guadalajara.  PREOPERATIVE DIAGNOSIS:  Severe degenerative arthritis of the right knee.  POSTOPERATIVE DIAGNOSIS;  Severe degenerative arthritis of the right knee.  OPERATION:  Right total knee arthroplasty utilizing the Osteonics system.  I utilized the posterior cruciate sacrificing system.  The sizes used were as follows:  A 26 patella, a size 9 right posterior stabilized femoral component, a size 7 tibial tray with a size 12 mm thickness posterior stabilized tibial insert.  All three components were cemented.  DESCRIPTION OF PROCEDURE:  Under general anesthesia, routine orthopedic prepping and draping of the right lower extremity was carried out.  The leg was exsanguinated and Esmarch tourniquet was elevated at 350 mmHg.  The patient had 0.5 g of vancomycin intravenously preoperatively.  At this time an incision was made down the anterior aspect of the right leg, bleeders identified and cauterized.  Two flaps were created.  I then carried out a median parapatellar approach, reflected the patella laterally.  The knee was flexed, and I carried out excisions of the anterior and posterior cruciate ligaments.  I also excised the medial and lateral menisci.  I then made the initial drill hole in the intercondylar notch.  A #1 jig was inserted, utilizing the intramedullary guide.  I removed a 12 mm thickness ______ out the distal femur.  We then inserted a #2 jig and carried out anterior and posterior ______ tied to a size 9 right posterior stabilized femoral component.  After this femoral cut was made, we then made a tibial cut.   I removed 4 mm thickness off the affected medial tibial side.  Intramedullary guides were used.  Following this, we then carried out our box cut.  We removed the box cut from the intercondylar groove of the femur.  We then inserted our trial prosthesis.  I utilized a size 9 right femur, and I utilized a size 7 tibial tray with a 12 mm thickness insert, which gave [place] the most stability.  Following this, I went ahead and prepared the patella.  I removed 10 mm thickness from the articular surface of the patella.  Three drill holes were made in the patella.  We then cut our Kiel cut in the tibial metaphysis.  I thoroughly water picked out the knee, dried the knee out, and cemented all three components in simultaneously.  Before inserting our permanent tibial insert I made sure we had no loose pieces of cement.  We removed all of those and irrigated the knee, dried the knee out, and inserted our 12 mm thickness size 7 tibial insert.  After the knee was thoroughly irrigated again we did a lateral release.  Note, the patella was also cemented in.  We utilized the burr to burr down the exterior part of the patella to make it flush with the prosthesis.  We thoroughly irrigated out the knee again, as I  said a lateral release was carried out.  The wound was closed in layers in the usual fashion over a Hemovac drain.  Sterile Neosporin bundle dressings were applied.  The patient left the operating room in satisfactory condition. DD:  02/28/00 TD:  02/29/00 Job: [id number]

## 2011-01-20 NOTE — Op Note (Signed)
   NAME:  Peggy [name], Peggy [name]                           ACCOUNT NO.:  [id number]   MEDICAL RECORD NO.:  [id number]                   PATIENT TYPE:  AMB   LOCATION:  DAY                                  FACILITY:  [facility]   PHYSICIAN:  Lindaann Slough, M.D.               DATE OF BIRTH:  23-Nov-1945   DATE OF PROCEDURE:  05/12/2003  DATE OF DISCHARGE:                                 OPERATIVE REPORT   PREOPERATIVE DIAGNOSIS:  Rule out recurrent bladder tumor.   POSTPROCEDURE DIAGNOSIS:  Rule out recurrent bladder tumor.   PROCEDURE DONE:  Cystoscopy.   INDICATION:  The patient is a 65 year old female who had a TUR bladder tumor  in 1997.  She has not had any recurrence since.  However, she had an  abnormal cytology in 2001.  She is scheduled today for surveillance  cystoscopy.   DESCRIPTION OF PROCEDURE:  Under general anesthesia, the patient was prepped  and draped and placed in the dorsal lithotomy position.  A #22 Wappler  cystoscope was inserted in the bladder.  The bladder mucosa is normal.  There is no stone or tumor in the bladder.  The ureteral orifices are in  normal position and shape with clear efflux.  The bladder was then emptied,  and the bladder was irrigated with normal saline, and bladder washings were  sent for cytology.   The cystoscope was then removed.   The patient tolerated the procedure well and left the OR in satisfactory  condition to postanesthesia care unit.                                               Lindaann Slough, M.D.    MN/MEDQ  D:  05/12/2003  T:  05/12/2003  Job:  [id number]   cc:   Jarome Matin, M.D.  [address] [place]  [place] [address]  Fax: [phone]

## 2011-01-20 NOTE — Op Note (Signed)
NAMETERRIONA, Peggy [name] NO.:  [id number]   MEDICAL RECORD NO.:  [id number]          PATIENT TYPE:  INP   LOCATION:  1513                         FACILITY:  [facility]   PHYSICIAN:  Georges Lynch. Gioffre, M.D.DATE OF BIRTH:  06-Jan-1946   DATE OF PROCEDURE:  11/07/2005  DATE OF DISCHARGE:                                 OPERATIVE REPORT   SURGEON:  Georges Lynch. Darrelyn Hillock, M.D.   ASSISTANT:  Jamelle Rushing, P.A.   PREOP DIAGNOSIS:  Painful loosened Uni-knee arthroplasty left knee.   POSTOP DIAGNOSIS:  Painful loosened Uni-knee arthroplasty left knee.   OPERATION:  1.  Revision of the left total knee arthroplasty.  2.  Removal of the Uni-knee arthroplasty.  3.  Insertion of a left total knee DePuy-type rotating platform. The size      used for the femur was a size 2.5 left femur, tibial tray was a size 3      tibial tray, with a size 2.5 rotating platform insert which measured 10      mm in thickness.   PROCEDURE:  Under general anesthesia the patient first had 600 mg of  clindamycin IV. Following that, sterile prep and drape in the left lower  extremity was carried out. An incision was made over the anterior aspect of  the left knee, utilizing her old incision site. Two flaps were created and  reflected medial laterally. I then carried out a medial patellar approach,  reflected the patella laterally, and then flexed the knee. We then debrided  all the synovium we did a definite synovectomy, at this time, and we removed  the Uni-knee arthroplasty.   Great care was taken to remove the uni-knee without destruction of the bone.  After this was done, we then proceeded with our usual procedure for a total  knee.  A #1 jig was inserted.  We removed 12-mm thickness off the distal  femur. Following this, a #2 jig was inserted for a size 2.5 femur and the  appropriate anterior-posterior chamfer cuts were carried out. We also  excised the anterior and posterior cruciate ligaments and  did medial and  lateral meniscectomies, because he already had a medial meniscectomy.  Following that, we curetted out the cement from the medial tibial plateau  and debrided the area. We then prepared our tibia for a size 3.5 tibial  tray. We first removed 4-mm thickness off of the unaffected side and then,  again, removed another 4-mm thickness until we were down nice and even and  with the medial plateau.   We then cut our keel cut in the usual fashion, in the tibial plateau that  is. Following that, we then went ahead and cut our groove cut out of the  distal femur for a size 2.5 left femur. At this particular time, we then  withdrew the utilizing spacers in flexion and extension to make sure that we  had appropriate tension on our medial and lateral collateral ligaments. We  then prepared our patella in the usual fashion by removing the appropriate  amount of bone from the  patella. We did a resurfacing patella for a size 32  patella and 3 drill holes were made in the patella.   Once the knee was prepared, we thoroughly Waterpiked out the knee, dried the  knee out, and cemented all 3 components in simultaneously. Note, some drill  holes were made in the medial tibial plateau prior to inserting the cement.  After the trial components were inserted; and the cement was dried. We then  removed the tibial trial component and searched for loose pieces of cement  and removed those. After this was done, we then went on and Waterpiked out  the knee again and inserted our permanent 10 mm thickness, size 2.5 rotating  platform insert.  We reduced the knee, took the knee through motion; and we  had excellent stability. We had good flexion and extension.   We then Waterpiked the knee out, again, irrigated it with antibiotic  solution.  We then inserted the Hemovac drain, closed the knee in layers in  the usual fashion. Staples were placed in the skin. Sterile Neosporin  dressings were applied.  The patient left the operating room in satisfactory  condition.           ______________________________  Georges Lynch Darrelyn Hillock, M.D.     RAG/MEDQ  D:  11/07/2005  T:  11/08/2005  Job:  [id number]

## 2011-01-20 NOTE — Op Note (Signed)
Peggy [name], Peggy [name]                 ACCOUNT NO.:  [id number]   MEDICAL RECORD NO.:  [id number]          PATIENT TYPE:  AMB   LOCATION:  ENDO                         FACILITY:  [facility]   PHYSICIAN:  Anselmo Rod, M.D.  DATE OF BIRTH:  [birth year]   DATE OF PROCEDURE:  08/08/2006  DATE OF DISCHARGE:                               OPERATIVE REPORT   PROCEDURE PERFORMED:  Colonoscopy with cold biopsies x1.   ENDOSCOPIST:  Anselmo Rod, M.D.   INSTRUMENT USED:  Olympus video colonoscope.   INDICATIONS FOR PROCEDURE:  65 year old African American female  undergoing screening colonoscopy.  The patient has a history of blood in  stool and a personal history of bladder cancer.  Rule out colonic  polyps, masses, etc.   PREPROCEDURE PREPARATION:  Informed consent was procured from the  patient.  The patient fasted for 4 hours prior to the procedure and  prepped with 12 OsmoPrep pills the morning of and 20 OsmoPrep the night  before the procedure.  The risks and benefits of the procedure including  a 10% miss rate of cancer and polyp were discussed with the patient, as  well.   PREPROCEDURE PHYSICAL:  The patient had stable vital signs.  Neck  supple.  Chest clear to auscultation.  Heart regular.  Abdomen soft with  normal bowel sounds.   DESCRIPTION OF PROCEDURE:  The patient was placed in the left lateral  decubitus position and sedated with 125 mcg of fentanyl and 12.5 mg of  Versed given intravenously in slow incremental doses.  Once the patient  was adequately sedated and maintained on low flow oxygen and continuous  cardiac monitoring, the Olympus video colonoscope was advanced from the  rectum to the cecum.  The patient had a significant amount of residual  stool in the colon.  Multiple washes were done.  A flat polyp was  biopsied from the cecum (cold biopsies x1).  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  These were not ablated as they did not seem to be bleeding.  The  terminal  ileum appeared normal.  Retroflexion in the rectum revealed no  abnormalities.  The patient tolerated the procedure well without  complications.   IMPRESSION:  1. Flat polyp biopsied from the cecum.  2. 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  3. Normal terminal ileum.  4. No evidence of diverticulosis.   RECOMMENDATIONS:  1. Await pathology results.  2. Avoid all nonsteroidals including aspirin for the next two weeks.  3. Repeat colonoscopy depending on pathology results.  4. Outpatient follow up as need arises in the future.      Anselmo Rod, M.D.  Electronically Signed     JNM/MEDQ  D:  08/08/2006  T:  08/09/2006  Job:  [id number]   cc:   Jarome Matin, M.D.

## 2011-01-20 NOTE — Op Note (Signed)
NAMECARLISS, Peggy [name]                 ACCOUNT NO.:  [id number]   MEDICAL RECORD NO.:  [id number]          PATIENT TYPE:  AMB   LOCATION:  DAY                          FACILITY:  [facility]   PHYSICIAN:  Lindaann Slough, M.D.  DATE OF BIRTH:  1945/11/08   DATE OF PROCEDURE:  01/16/2005  DATE OF DISCHARGE:                                 OPERATIVE REPORT   PREOPERATIVE DIAGNOSIS:  Rule out recurrent bladder tumor.   POSTOPERATIVE DIAGNOSIS:  No recurrent bladder tumor.   PROCEDURE:  Cystoscopy and urine cytology.   SURGEON:  Lindaann Slough, M.D.   ANESTHESIA:  General.   INDICATIONS:  The patient is a 65 year old female who had a TUR bladder  tumor in 1997.  She has not had any recurrence since.  She had abnormal  cytology in 2001, and cytology has been negative since.  She is scheduled  today for surveillance cystoscopy.   DESCRIPTION OF PROCEDURE:  Under general anesthesia, the patient was prepped  and draped and placed in the dorsal lithotomy position.  A #22 Wappler  cystoscope was inserted into the bladder.  The bladder mucosa is normal.  There is no stone or tumor in the bladder.  The ureteral orifices are in  normal position and shape with clear efflux.  There was a small  diverticulum on the left lateral wall of the bladder, and there is no stone  or tumor in the diverticulum either.  The bladder has been emptied, and the  bladder was irrigated with normal saline and bladder washings were sent for  cytology.   The cystoscope was removed.   She tolerated the procedure well.      MN/MEDQ  D:  01/16/2005  T:  01/16/2005  Job:  [id number]   cc:   Jarome Matin, M.D.  [address] [place]  [place] [address]  Fax: [phone]

## 2011-01-20 NOTE — Op Note (Signed)
Peggy [name], Peggy [name]                 ACCOUNT NO.:  [id number]   MEDICAL RECORD NO.:  [id number]          PATIENT TYPE:  AMB   LOCATION:  DAY                          FACILITY:  [facility]   PHYSICIAN:  Lindaann Slough, M.D.  DATE OF BIRTH:  21-Sep-1945   DATE OF PROCEDURE:  10/22/2006  DATE OF DISCHARGE:                               OPERATIVE REPORT   PREPROCEDURE DIAGNOSIS:  Rule out recurrent bladder tumor.   POSTPROCEDURE DIAGNOSIS:  No recurrent bladder tumor.   PROCEDURE:  Cystoscopy.   INDICATION:  The patient is a 65 year old female who had TUR bladder  tumor in 1997.  She has not had any recurrence since.  She is now  scheduled for surveillance cystoscopy.   DESCRIPTION OF PROCEDURE:  Under general anesthesia, the patient was  prepped and draped and placed in the dorsal lithotomy position.  A #22  panendoscope was inserted in the bladder.  The bladder mucosa is normal.  There is no stone or tumor in the bladder.  The ureteral orifices are in  normal position and shape with clear efflux.  The bladder was examined  with both Foroblique and a right angle lenses.  The bladder was then  irrigated with normal saline and bladder washings were sent for  cytology.   The patient tolerated the procedure well and left the OR in satisfactory  condition to the postanesthesia care unit.      Lindaann Slough, M.D.  Electronically Signed     MN/MEDQ  D:  10/22/2006  T:  10/22/2006  Job:  [id number]   cc:   Jarome Matin, M.D.  Fax: [phone]

## 2011-01-20 NOTE — Op Note (Signed)
[place]  Patient:    Peggy [name], Peggy [name] Visit Number: [id number] MRN: [id number]          Service Type: DSU Location: [facility] Attending Physician:  Lindaann Slough Proc. Date: 05/02/01 Adm. Date:  05/02/2001   CC:         Jarome Matin, M.D.   Operative Report  PREOPERATIVE DIAGNOSIS:  Rule out recurrent bladder tumor.  POSTOPERATIVE DIAGNOSIS:  No recurrent bladder tumor.  PROCEDURE:  Cystoscopy.  SURGEON:  Lindaann Slough, M.D.  ANESTHESIA:  General.  INDICATIONS:  The patient is a 65 year old female who had a TUR of a bladder tumor in 1997.  She had a positive cytology in 1998.  Cytologies and cystoscopies have been negative since.  She had gross hematuria about 2 months ago.  An IVP showed normal upper tracts.  She is scheduled today for surveillance cystoscopy.  Under general anesthesia the patient was prepped and draped and placed in the dorsal lithotomy position.  A 22 Wappler cystoscope was inserted in the bladder. The bladder mucosa is normal.  There is no stone or tumor in the bladder.  The ureteral orifices are in normal position and shape with clear efflux.  The bladder was then irrigated with normal saline and bladder washings were sent for cytology.  The cystoscope was then removed.  The patient tolerated the procedure well and left the operating room in satisfactory condition to PACU. Attending Physician:  Lindaann Slough DD:  05/02/01 TD:  05/02/01 Job: [id number]

## 2011-01-20 NOTE — Op Note (Signed)
NAME:  Peggy [name], Peggy [name]                           ACCOUNT NO.:  [id number]   MEDICAL RECORD NO.:  [id number]                   PATIENT TYPE:  INP   LOCATION:  0001                                 FACILITY:  [facility]   PHYSICIAN:  Georges Lynch. Darrelyn Hillock, M.D.             DATE OF BIRTH:  Jan 17, 1946   DATE OF PROCEDURE:  06/24/2002  DATE OF DISCHARGE:                                 OPERATIVE REPORT   SURGEON:  Georges Lynch. Darrelyn Hillock, M.D.   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   OPERATION:  Repicci Uni Knee arthroplasty.  The sized used were as follows:  I utilized a 37 mm x 6.5 mm tibial insert and a 45 mm left femoral  component.  Both components were cemented, and I utilized vancomycin  antibiotic mixed in with the cement.   DESCRIPTION OF PROCEDURE:  Under general anesthesia, a routine orthopedic  prep and draping of the left lower extremity was carried out.  The patient's  knee was placed in the knee holder exactly like we do doing knee  arthroscopy.  At this time, the leg was exsanguinated with Esmarch, and the  tourniquet was elevated to 350 mmHg.  An incision was made over the medial  aspect of the patella, carried down to the proximal tibia.  I then carried  the incision down into the knee joint.  I removed a small portion of the fat  pad.  I then undermined the anterior aspect of the patella for about 5 mm  and then utilized the oscillating saw and did an osteectomy of the inner  border of the patella.  Once this was done, we then utilized the bur to even  the area out.  I then flexed the knee, went down and did a medial  meniscectomy and them removed 5 mm thickness of the distal femur off with  the oscillating saw.  At this particular time, the distraction  device was  applied in the usual fashion; the knee was distracted, and I did bur the  articular surface of the tibia for an inlay-type prosthesis.  We did go  through the various trial tibial components and selected the 37 x 6.5 mm  tibial insert.  Once the tibia was prepared, we then prepared the femur in  the usual fashion for a size 45 mm femoral component.  We made our initial  drill hole in the femur and then went through the routine for the femoral  component preparation.  Once this was done, we then went through trials, and  we had excellent stability with the trials in place.  The trials were stable  during flexion and extension, and the knee was stable in lateral medial  plane.  Thoroughly irrigated out the area.  Then the vancomycin was mixed  with the cement; cement was inserted into the tibia and into the femur, and  both components were cemented in place.  First the tibial component then the  femur.  Note, sponges were packed in the suprapatellar pouch as well as  posteriorly to avoid any leakage of cement.  Prior to cement drying, we then  removed the sponges, thoroughly irrigated the knee, and removed all loose  pieces of cement.  We made multiple attempts to make sure there were no  loose pieces of cement present.  The tibia and femoral components were held  in compression.  Then the knee was flexed again, and we then further  searched for cement.  We thoroughly irrigated out the area and then held the  knee in extension until the cement was dry.  Following this, we took the  knee through motion, and knee was stable.  We then thoroughly  irrigated out the knee, inserted a Hemovac, and then we reapproximated our  SNIP that we did on the vastus medialis.  We the closed the remaining part  of the knee in layers in the usual fashion.  Sterile dressings were applied.  The patient left the operating room in satisfactory condition.  Prior to  surgery, she had 500 mg of vancomycin  IV.                                               Ronald A. Darrelyn Hillock, M.D.    RAG/MEDQ  D:  06/24/2002  T:  06/24/2002  Job:  [id number]

## 2011-01-20 NOTE — Op Note (Signed)
NAMEWILLYE, JAVIER                 ACCOUNT NO.:  [id number]   MEDICAL RECORD NO.:  [id number]          PATIENT TYPE:  AMB   LOCATION:  DAY                          FACILITY:  [facility]   PHYSICIAN:  Lindaann Slough, M.D.  DATE OF BIRTH:  10/15/1945   DATE OF PROCEDURE:  07/18/2004  DATE OF DISCHARGE:                                 OPERATIVE REPORT   PREOPERATIVE DIAGNOSIS:  Rule out recurrent bladder tumor.   POSTOPERATIVE DIAGNOSIS:  No recurrent bladder tumor.   PROCEDURE:  Cystoscopy.   INDICATIONS:  The patient is a 65 year old female who had a TUR bladder  tumor in 1997. She has not had any recurrence since; however, in 2001, she  had an abnormal cytology. She is scheduled today for surveillance  cystoscopy.   Under general anesthesia, the patient was prepped and draped and placed in  the dorsal lithotomy position. A #22 Wappler cystoscope was inserted in the  bladder. The bladder mucosa is normal. No stone or tumor in the bladder. The  ureteral orifices are in the normal position and shape with clear efflux.  The bladder was examined with both ___________ oblique and right angle  lenses. Cystoscope was then removed. The bladder was irrigated with normal  saline and bladder washings and was sent for cytology.   The cystoscope was then removed.   The patient tolerated the procedure well and left the OR in satisfactory  condition to post anesthesia care unit.      MN/MEDQ  D:  07/18/2004  T:  07/18/2004  Job:  [id number]   cc:   Jarome Matin, M.D.  [address] [place]  [place] [address]  Fax: [phone]

## 2011-01-20 NOTE — Discharge Summary (Signed)
Peggy [name] NO.:  [id number]   MEDICAL RECORD NO.:  [id number]          PATIENT TYPE:  INP   LOCATION:  5025                         FACILITY:  [facility]   PHYSICIAN:  Madlyn Frankel. Charlann Boxer, M.D.  DATE OF BIRTH:  Dec 30, 1945   DATE OF ADMISSION:  08/13/2008  DATE OF DISCHARGE:  08/15/2008                               DISCHARGE SUMMARY   ADMITTING DIAGNOSIS:  Failed left total knee replacement.   SECONDARY DIAGNOSES:  1. Hypertension.  2. Reflux disease.  3. Chronic back pain.  4. Depression.   PROCEDURES PERFORMED:  On August 13, 2008, the patient underwent  revision of left total knee replacement without complication.  See  dictated operative note for full details of the procedure.   BRIEF ADMITTING HISTORY:  Peggy [name] is a 65 year old female who had  presented to the office for evaluation of her knee.  She had had a  history of partial knee replacement in 2003 that was failed and revised  to a total knee replacement in 2007.  She has had persistent pain and  problems since that time.  We had worked up extensively in the office to  rule out infection.  Given the persistent discomfort that was refractory  and nonoperative measures, she had at this point wished to have a re-  revision of her left knee.  Risks, benefits, and persistent discomfort  were all reviewed prior to admission.   HOSPITAL COURSE:  The patient was admitted on same day surgery on  August 13, 2008, for left total knee revision.  Please see dictated  operative note for the procedure.  She did have a revision knee surgery  and post resection, her workup for infection was negative.   The procedure went without complication.   She was transferred to the orthopedic floor where she remained for just  2 days in the hospital.  Her hospital course was unremarkable.  She was  seen and evaluated by physical therapy and was mobile.  Postop day 2,  she had cleared therapies and recommended  guidelines.  Plan was to be  discharged home.  Arrangements were made for home health therapy.  On  August 15, 2008, postop day #2, her hematocrit was 25.6 and she  remained neurovascularly stable with no evidence of any significant  hypotensive episode.   Her wound was intact.   DISCHARGE INSTRUCTIONS:  The patient was discharged home with home  health physical therapy.  She is to keep her wound dry until followup in  the next 2 weeks.  She will be seen and evaluated by physical therapy on  an outpatient basis to work on strength and range of motion.   DISCHARGE MEDICATIONS:  In the preoperative planning, postoperative  medications were prescribed which include her home medications of  Lipitor, bethanechol, chloride, Mavik, and aspirin every morning.  She  will be discharged with pain medicines which include Norco.  Discharge  medications would include Norco for pain 7.5 mg 1-2 tablets every 4-6  hours as needed, Robaxin 500 mg q.6 h. p.r.n. for pain.  In addition,  medications recommend include Colace over-the-counter b.i.d. for stool  softening, MiraLax 17 g daily for constipation while on pain medicines,  in addition to iron 325 mg t.i.d..   She is noted to contact our office at [phone] to set up a followup  appointment and also to call with any questions and concerns.   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   DISCHARGE DIAGNOSES:  1. 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  2. Acute perioperative blood loss anemia.     ______________________________  Peggy [name]. Peggy [name], [place]      Madlyn Frankel. Charlann Boxer, M.D.  Electronically Signed    BLM/MEDQ  D:  11/24/2008  T:  11/25/2008  Job:  [id number]

## 2011-01-20 NOTE — Procedures (Signed)
[place]  Patient:    Peggy [name], Peggy [name] Visit Number: [id number] MRN: [id number]          Service Type: END Location: ENDO Attending Physician:  Charna Elizabeth Dictated by:   Anselmo Rod, M.D. Proc. Date: 10/30/01 Admit Date:  10/30/2001   CC:         Jarome Matin, M.D.   Procedure Report  DATE OF BIRTH:  November 30, 1945  REFERRING PHYSICIAN:  Jarome Matin, M.D.  PROCEDURE PERFORMED:  Colonoscopy.  ENDOSCOPIST:  Anselmo Rod, M.D.  INSTRUMENT USED:  Olympus video colonoscope.  INDICATIONS FOR PROCEDURE:  Rectal bleeding and a personal history of bladder cancer in a 65 year old African-American female.  Rule out colonic polyps, masses, hemorrhoids, etc.  PREPROCEDURE PREPARATION:  Informed consent was procured from the patient. The patient was fasted for eight hours prior to the procedure and prepped with a bottle of magnesium citrate and a gallon of NuLytely the night prior to the procedure.  PREPROCEDURE PHYSICAL:  The patient had stable vital signs.  Neck supple. Chest clear to auscultation.  S1, S2 regular.  Abdomen soft with normal abdominal bowel sounds.  DESCRIPTION OF PROCEDURE:  The patient was placed in the left lateral decubitus position and sedated with 100 mg of Demerol and 10 mg of Versed intravenously.  Once the patient was adequately sedated and maintained on low-flow oxygen and continuous cardiac monitoring, the Olympus video colonoscope was advanced from the rectum to the cecum with slight difficulty. There was a large amount of residual stool in the colon.  Small internal hemorrhoids were appreciated on retroflexion.  No masses or polyps were seen and the patient tolerated the procedure well without complication.  IMPRESSION: 1. Normal colonoscopy up to the cecum except for a small nonbleeding internal    hemorrhoid. 2. Large amount of residual stool in the colon.  Small lesion could have been    missed.  RECOMMENDATIONS: 1. A high fiber diet has been recommended for the patient with liberal fluid    intake. 2. Further recommendation made in follow-up in the next three weeks.Dictated by:   Anselmo Rod, M.D. Attending Physician:  Charna Elizabeth DD:  10/30/01 TD:  10/30/01 Job: [id number]

## 2011-01-20 NOTE — Cardiovascular Report (Signed)
[name], [name] NO.:  [id number]   MEDICAL RECORD NO.:  [id number]          PATIENT TYPE:  OIB   LOCATION:  2853                         FACILITY:  [facility]   PHYSICIAN:  Peggy [name], M.D.DATE OF BIRTH:  [birth year]   DATE OF PROCEDURE:  08/03/2004  DATE OF DISCHARGE:                              CARDIAC CATHETERIZATION   PROCEDURE PERFORMED:  1.  Selective coronary angiography by Dr. Blondell [name] technique.  2.  Retrograde left heart catheterization.  3.  Left ventricular angiography.  4.  Abdominal aortography.   COMPLICATIONS:  None.   ENTRY SITE:  Right femoral.   DYE USED:  Omnipaque.   CATHETER USED:  6-French Judkins catheter, right femoral AngioSeal, 6-French-  -completed without complications.   PATIENT PROFILE:  The patient is a 65 year old female who is a medical  patient of Dr. Jeri [name] who has diabetes mellitus and substernal and  epigastric chest pain that is worse with exertion. There is also some left  arm pain with it and occasionally some nausea, diaphoresis, and shortness of  breath. The patient does not have nitroglycerin to use for the pain. She is  unsure if anything else helps relieve it. It is typically relieved with  rest. The patient weighs 207 pounds and had a cardiac catheterization 10  years ago that was said to be normal. She has a history of degenerative  joint disease, two back surgeries, a total knee replacement on the right,  and treated hypertension and hyperlipidemia. She entered the cath lab today  based on her symptoms alone. We had not done a Cardiolite stress test on  her. Kidney function showed a creatinine of 0.4, BUN 1, hemoglobin 10.4.   RESULTS:   PRESSURES:  The left ventricular pressure was 180/7, end-diastolic pressure  19, central aortic pressure was 180/80 with a mean of 15. No aortic valve  gradient by pull-back technique.   ANGIOGRAPHIC RESULTS:  The patient had normal coronary arteries with  a right  coronary dominant system and normal LV systolic function. Abdominal aorta  showed no evidence of abdominal aorta pathology and normal renal arteries.   FINAL DIAGNOSES:  1.  Symptoms of classic angina in a diabetic, obese woman with multiple risk      factors.  2.  Angiographic patent coronary arteries.  3.  Normal left ventricular systolic function.   PLAN:  Reassurance, continuation of current medications, and further workup  as deemed necessary by Dr. Jeri [name].       WHG/MEDQ  D:  08/03/2004  T:  08/03/2004  Job:  [id number]   cc:   Peggy [name], M.D.  [address] [place]  [place] [address]  Fax: [phone]   [facility]

## 2011-01-20 NOTE — H&P (Signed)
NAME:  Peggy [name], Peggy [name]                           ACCOUNT NO.:  [id number]   MEDICAL RECORD NO.:  [id number]                   PATIENT TYPE:  INP   LOCATION:  NA                                   FACILITY:  [facility]   PHYSICIAN:  Georges Lynch. Darrelyn Hillock, M.D.             DATE OF BIRTH:  June 17, 1946   DATE OF ADMISSION:  05/21/2002  DATE OF DISCHARGE:                                HISTORY & PHYSICAL   CHIEF COMPLAINT:  Left knee pain.   HISTORY OF PRESENT ILLNESS:  The patient has history of left knee pain over  the past several years which she has previous arthroscope surgery on the  right knee.  The patient has come to see Dr. Darrelyn Hillock in regards to left knee  pain medial joint line, pain with weightbearing activities.  The patient  ambulates with a cane.  All conservative treatment has failed at this point.   ALLERGIES:  CEPHALOSPORIN, ERYTHROMYCIN, SULFA, PENICILLIN, TETRACYCLINE -  these antibiotics cause rash, itching, and swelling.   CURRENT MEDICATIONS:  1. Tarka 4 mg/240 mg once q.d.  2. Triamterine/HCTZ 37.5 mg/25 mg q.d.  3. Ambien 5 mg q.h.s.  4. Diazepam 2 mg t.i.d. as needed.  5. Premarin 0.625 mg.  6. Pravachol 20 mg q.d.  7. Bextra 20 mg q.d.   PAST MEDICAL HISTORY:  1. Bladder cancer.  2. Non-insulin-dependent diabetes.  3. Osteoarthritis.  4. Hypertension.   SURGICAL HISTORY:  1. Right knee arthroscope in 2001.  2. Back surgery in 2002 for herniated disks.  3. Cysto in September 2003.   SOCIAL HISTORY:  The patient is nonsmoker and nonalcohol.  The patient lives  with son in an apartment with four steps leading into the apartment.   FAMILY HISTORY:  Positive for heart disease, hypertension, diabetes, cancer,  and osteoarthritis.   REVIEW OF SYSTEMS:  CNS:  No seizure disorder, paralysis, numbness, double  vision.  The patient is deaf in the left ear.  RESPIRATORY:  No productive  cough, no hemoptysis, no shortness of breath.  CARDIOVASCULAR:  No chest  pain,  no angina, no orthopnea.  GASTROINTESTINAL:  No nausea, vomiting,  melena, or bloody stools.  GENITOURINARY:  No discharge, dysuria, hematuria.  MUSCULOSKELETAL:  Left knee pain with weightbearing activities, passive and  active range of motion, decreased range of motion of left knee secondary to  pain.  Ligaments are stable.  Patella is well-localized in the midline.  The  patient does ambulate with a cane, has a limp secondary to pain.  The  patient also has a positive straight leg raise on the left side.   PHYSICAL EXAMINATION:  GENERAL:  The patient is a well-nourished, well-  developed female, 65 years of age.  HEENT:  The patient is deaf in the left ear.  Pupils are equal, round, and  reactive to light.  NECK:  Supple.  No bruits, no adenopathy.  CHEST:  Bilateral lungs are clear to auscultation.  No wheezes, no rales.  BREASTS:  Not pertinent to procedure, not examined.  HEART:  Regular rate and rhythm, no murmurs.  ABDOMEN:  Nontender to palpation, soft, positive bowel sounds.  GENITOURINARY:  Not pertinent to procedure, not examined.  EXTREMITIES:  As above in history of present illness.  SKIN:  No rashes, warm, dry.   LABORATORY DATA:  X-ray:  On x-ray, shows left knee with collapsed medial  joint space.   IMPRESSIONS:  1. Degenerative arthritis with medial joint space collapsed.  2. Bladder cancer.  3. Non-insulin-dependent diabetes.  4. Osteoarthritis.  5. Hypertension.   PLAN:  Repicci Uniknee.     Shawna Orleans, PA                    Georges Lynch. Darrelyn Hillock, M.D.    MEC/MEDQ  D:  05/14/2002  T:  05/14/2002  Job:  [id number]

## 2011-01-20 NOTE — H&P (Signed)
NAME:  Peggy [name], Peggy [name]                           ACCOUNT NO.:  [id number]   MEDICAL RECORD NO.:  [id number]                   PATIENT TYPE:  INP   LOCATION:  NA                                   [facility]ACILITY:  [facility]   PHYSICIAN:  Georges Lynch. Darrelyn Hillock, M.D.             DATE OF BIRTH:  [birth year]   DATE OF ADMISSION:  06/24/2002  DATE OF DISCHARGE:                                HISTORY & PHYSICAL   CHIEF COMPLAINT:  Left knee pain.   HISTORY OF PRESENT ILLNESS:  The patient is a 65 year old female with a  several year history of left knee pain.  She complains that she has some  occasional locking and swelling in her knee.  She has had previous  arthroscopic surgery on her right knee by Dr. Myrtie Neither in the past.  Conservative treatment has failed and the risks and benefits of the surgery  have been discussed with the patient and the patient wishes to proceed with  left repeat to union knee.   PAST MEDICAL HISTORY:  1. Hypertension.  2. Diabetes mellitus, type 2.  3. Osteoarthritis.  4. Bladder cancer.  5. Migraine headaches.   PAST SURGICAL HISTORY:  1. Right knee scope.  2. Lumbar surgery for herniated nucleus pulposus (diskectomy).  3. Cysto.   MEDICATIONS:  1. Mavik 4 mg one p.o. q.d.  2. Premarin 0.625 mg one p.o. q.d.  3. Pravachol 20 mg one p.o. q.d.  4. Bextra 20 mg one p.o. q.d.   ALLERGIES:  1. CEPHALOSPORINS.  2. ERYTHROMYCIN.  3. SULFA.  4. PENICILLIN.  5. TETRACYCLINE.  All these cause rash, itching, and swelling.   SOCIAL HISTORY:  The patient denies any tobacco or alcohol use.  She lives  in her son in a one story apartment with four steps into the apartment.   FAMILY HISTORY:  Father:  Coronary artery disease with hypertension.  Sisters:  Diabetes mellitus.   REVIEW OF SYSTEMS:  GENERAL:  Denies fevers, chills, night sweats, bleeding  tendencies.  CENTRAL NERVOUS SYSTEM:  Denies blurry or double  , vision,  seizures, headache, paralysis.  RESPIRATORY:   Denies shortness of breath,  productive cough, hemoptysis.  CARDIOVASCULAR:  Denies chest pain.  There is  no orthopnea.  GASTROINTESTINAL:  Denies nausea, vomiting, diarrhea,  constipation, melena, bloody stools.  GENITOURINARY:  Denies dysuria,  hematuria, discharge.  MUSCULOSKELETAL:  Pertinent to HPI.   PHYSICAL EXAMINATION:  VITAL SIGNS:  Blood pressure 138/70, pulse 72,  respirations 16.  GENERAL:  Well-developed, well-nourished, 65 year old female.  HEENT:  Normocephalic, atraumatic.  Pupils equal, round, and reactive to  light.  NECK:  Supple.  No carotid bruit noted.  CHEST:  Clear to auscultation bilaterally.  No wheezes or crackles.  HEART:  Regular rate and rhythm.  No murmurs, rubs, or gallops.  ABDOMEN:  Soft, nontender, nondistended.  Positive bowel sounds x4.  EXTREMITIES:  She has  minimal knee fusion and there is painful range of  motion and she walks with antalgic gait and uses a cane.  SKIN:  No rashes or lesions.   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  Extensive degeneration of the mid body  and posterior horn of the medial meniscus with an oblique tear of the  posterior horn extending to the undersurface.   IMPRESSION:  1. Osteoarthritis, left knee.  2. Hypertension.  3. Diabetes mellitus, type 2.  4. Osteoarthritis.  5. Bladder cancer.  6. Migraine headaches.   PLAN:  The patient will be admitted to the [facility] on June 24, 2002, and undergo repeat to union knee on the left by Dr. Ranee Gosselin.        Clarene Reamer, P.A.-C.                   Ronald A. Darrelyn Hillock, M.D.    SW/MEDQ  D:  06/17/2002  T:  06/17/2002  Job:  [id number]

## 2011-01-20 NOTE — Op Note (Signed)
[facility]  Patient:    Peggy [name], Peggy [name]                        MRN: [id number] Proc. Date: 02/27/01 Adm. Date:  [id number] Disc. 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 Note that she had previous microdiskectomy at this level in [place] in 1980 by another Careers adviser.  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 Note that she had previous microdiskectomy at this level in [place] in 1980 by another Careers adviser.  OPERATION: 1. Facetectomy of the inferior facet at L4-5. 2. Foraminotomy at L4-5 on the left. 3. Lateral recess decompression for recess stenosis at L4-5 on the left. 4. Microdiskectomy at L4-5 on the left.  SURGEON:  Georges Lynch. Darrelyn Hillock, M.D.  ASSISTANT:  Javier Docker, M.D.  ANESTHESIA:  General.  DESCRIPTION OF PROCEDURE:  Under general anesthesia, routine orthopedic prep and drape of lower back carried out.  She had 500 mg of vancomycin preop.  At this time, two needles were placed in the back for localization purposes. X-ray was taken to verify the position.  We then carried the incision down to the L4-5 interspace.  Another x-ray was taken to further verify our position. We then went down and noted there was a marked amount of scar tissue at L4-5. She had marked overgrowth of bone in that area.  We had to utilize the bur to bur down the lateral recess as well as the inferior and superior laminectomy site.  After this was done, we curetted out the scar tissue.  We then carried out our hemilaminectomy in the usual fashion.  In fact, we removed the entire lamina of below because a fragment did migrate distally.  So actually, the lamina of L5 was removed on the left, but there was a very small amount of it left in the first place from her previous surgery.  We then did a  partial facetectomy, went out lateral, decompressed the lateral recess.  She had a marked amount of scar tissue there.  Once this was decompressed, we easily identified the root at 4-5 and then went down and removed a large piece of disk that was actually up under the roof.  It migrated from the space caudalward.  We removed that.  The root now was quite free.  We were able to examine the root both proximally and distally.  We were able to easily get up under the root.  We did a foraminotomy as well and freed the root.  We thoroughly irrigated out the area.  Good hemostasis was maintained with thrombin-soaked Gelfoam.  The wound was closed in layers in the usual fashion. Sterile dressings were applied. DD:  02/27/01 TD:  02/27/01 Job: [id number]

## 2011-01-20 NOTE — Discharge Summary (Signed)
NAMEMARGIE, [name] NO.:  [id number]   MEDICAL RECORD NO.:  [id number]          PATIENT TYPE:  INP   LOCATION:  1513                         [facility]   PHYSICIAN:  Georges Lynch. Gioffre, M.D.DATE OF BIRTH:  Dec 23, 1945   DATE OF ADMISSION:  11/07/2005  DATE OF DISCHARGE:  11/12/2005                                 DISCHARGE SUMMARY   DISPOSITION:  To home.   ADMISSION DIAGNOSES:  1.  Failed left medial unicompartmental arthroplasty.  2.  Hypertension.  3.  Diet-controlled diabetes.  4.  Reflux disease.  5.  History of chronic bronchitis.  6.  History of migraines.  7.  History of anxiety.  8.  History of urinary retention, status post bladder and skin cancer.   DISCHARGE DIAGNOSES:  1.  Revision of left medial unicompartmental arthroplasty to a total knee      arthroplasty.  2.  Postoperative blood loss anemia, asymptomatic, allowed to self-correct      with oral supplements, tolerating it well.  3.  History of hypertension.  4.  History of diet-controlled diabetes.  5.  History of reflux disease.  6.  History of chronic bronchitis.  7.  History of migraines.  8.  History of anxiety.  9.  History of urinary retention.   HISTORY OF PRESENT ILLNESS:  The patient is a 65 year old female who had a  left knee unicompartmental medial arthroplasty in 2003 and is having  continued pain and question whether the arthroplasty is loose.  The patient  and Dr. Darrelyn Hillock, after evaluation and treatment, elected to proceed with  revision to a total knee arthroplasty.   ALLERGIES:  PENICILLIN, SULFA, TETRACYCLINE, ERYTHROMYCIN, CEPHALOSPORINS.   CURRENT MEDICATIONS:  1.  Bethanechol chloride 50 mg one every six hours p.r.n.  2.  Pyridium 150 mg three times a day p.r.n.  3.  Pravachol 40 mg a day.  4.  Ambien CR one tablet nightly p.r.n.  5.  Mavik 2 mg a day.  6.  Nexium one tablet a day.  7.  Tylenol Arthritis p.r.n.   SURGICAL PROCEDURE:  On November 07, 2005, the patient was taken to the  operating room by Dr. Ranee Gosselin, assisted by Oneida Alar, P.A.-C.  Under  general anesthesia, the patient underwent removal of the left knee medial  unicompartmental arthroplasty and converted to a left total knee  arthroplasty with DePuy components.  The patient had the following  components implanted:  A size 2.5 femoral component, a size 3 keel tibial  tray, a size 32-mm 3-peg patella, a size 10-mm 2.5 polyethylene components  and all components were implanted with polymethyl methacrylate with  vancomycin mixed in.  The patient tolerated the procedure well and was  transferred to the recovery room and then to the orthopedic floor in good  condition on intravenous pain medicines, antibiotics and heparin subcu and  starting Coumadin for deep venous thrombosis prophylaxis.   HOSPITAL CONSULTS:  Routine OT, PT and Case Management consults.   HOSPITAL COURSE:  On November 07, 2005, the patient was admitted to [facility]  under the care of Dr. Ranee Gosselin.  The patient was taken to the  OR, where a left knee unicompartmental arthroplasty was converted to a total  knee arthroplasty without any complications.  The patient tolerated this  procedure well and was transferred to the recovery room and then to the  orthopedic floor in good condition.  The patient had some postoperative  blood loss anemia; the patient tolerated it well with physical therapy, so  this was allowed to self-correct with just p.o. supplement replacement.  The  patient was able to transition from the IV medications to p.o. medicines  well without any issues.  The patient worked well with Physical Therapy on a  daily basis.  The patient's wound remained benign for any signs of  infection.  She did have a moderate amount of ecchymosis about the wound.  There were no complications, so it was felt on postop day #5 the patient was  ready for discharge home for a routine outpatient  total knee protocol and  she was discharged in good condition.   LABORATORY DATA:  CBC on November 12, 2005:  WBC 12.5, down from a high of 15.2  with just routine postop antibiotics, hemoglobin 8.4, hematocrit 24.7,  platelets at 320,000.  The patient tolerated the hemoglobin level at that  level as well with Physical Therapy without any complications.  INR on November 12, 2005 was 1.4, on Coumadin.  Routine chemistries on November 10, 2005:  Sodium of 138, potassium of 3.1, glucose of 102, BUN 5, creatinine 1.0.  Routine urinalysis on admission was normal.   EKG on admission was normal sinus rhythm with sinus arrhythmia and/or PAC at  81 beats per minute.   MEDICATIONS UPON DISCHARGE FROM ORTHOPEDIC FLOOR:  1.  The patient received clindamycin IV during her postop stay.  2.  Coumadin 7.5 mg a day.  3.  Heparin subcu 5000 units b.i.d. until date of discharge.  4.  The patient was followed on routine insulin sliding-scale protocol.  5.  Percocet one or two tablets every four to six hours p.r.n. pain.  6.  Reglan 10 mg p.o. q.8 h. p.r.n.  7.  Phenergan 25 mg p.o. q.6 h. p.r.n.  8.  Robaxin 500 mg p.o. q.6 h. p.r.n.  9.  Urecholine 50 mg q.6 h. p.r.n.  10. Ferrous sulfate 325 mg p.o. t.i.d.  11. Pyridium 20 mg p.o. t.i.d. p.r.n.  12. Zocor 20 mg a day.  13. Mavik 2 mg a day.  14. Ambien 10 mg p.o. nightly p.r.n.  15. Esomeprazole 40 mg a day.  16. Tylenol 650 mg p.o. q.6 h. p.r.n.  17. Dulcolax suppository p.r.n.   DISCHARGE INSTRUCTIONS:   DIET:  No restrictions.   ACTIVITY:  The patient is to walk with the assistance of a walker as  directed by Physical Therapy.   WOUND CARE:  The patient is to change dressing daily.   MEDICATIONS:  The patient is to continue his medications as dispensed on  orthopedic floor with the inclusion of:  1.  Coumadin 5 mg one and a half tablets a day until changed by the pharmacy  2.  Trinsicon for iron as directed. 3.  Percocet as written one or two  tablets every four to six hours for pain.  4.  Robaxin 500 mg one tablet every eight hours for muscle spasms if needed.   FOLLOWUP:  1.  Follow up with Dr. Darrelyn Hillock in 2 weeks; please call [phone] for a  followup appointment.  2.  Home health physical therapy provided by [facility] per routine protocol.   CONDITION UPON DISCHARGE:  The patient's condition upon discharge to home is  listed as improved and good.      Jamelle Rushing, P.A.    ______________________________  Georges Lynch Darrelyn Hillock, M.D.    RWK/MEDQ  D:  12/19/2005  T:  12/20/2005  Job:  [id number]

## 2011-01-20 NOTE — H&P (Signed)
Peggy [name], Peggy [name]                 ACCOUNT NO.:  [id number]   MEDICAL RECORD NO.:  [id number]          PATIENT TYPE:  INP   LOCATION:  NA                           FACILITY:  [facility]   PHYSICIAN:  Georges Lynch. Gioffre, M.D.DATE OF BIRTH:  1946/05/24   DATE OF ADMISSION:  DATE OF DISCHARGE:                                HISTORY & PHYSICAL   CHIEF COMPLAINT:  Painful left knee.   HISTORY OF PRESENT ILLNESS:  The patient is a 65 year old female who had a  left knee unicompartmental arthroplasty medial compartment in 2003, is  having continued pain in that knee. The patient has been evaluated x-rays.  There is a question of loosening of the component. Dr. Darrelyn Hillock and the  patient discussed this further and the patient would like to proceed with  having this revised to a total knee arthroplasty.   ALLERGIES:  1.  PENICILLIN.  2.  SULFAS.  3.  TETRACYCLINE.  4.  ERYTHROMYCIN.  5.  CEPHALOSPORINS.   CURRENT MEDICATIONS:  1.  Bethanechol chloride 50 mg one every 6 hours p.r.n.  2.  Pyridium 150 mg three times a day p.r.n.  3.  Pravachol 40 mg a day.  4.  Ambien CR one tablet q.h.s. p.r.n.  5.  Mavik 2 mg a day.  6.  Nexium one tablet a day.  7.  Tylenol Arthritis p.r.n.   PAST MEDICAL HISTORY:  1.  Migraines.  2.  Anxiety, for which the patient reports taking diazepam, unknown dose.  3.  History of chronic bronchitis.  4.  Hypertension.  5.  Reflux disease.  6.  Urinary retention status post a bladder cancer.  7.  Diet-controlled diabetes.   PAST SURGICAL HISTORY:  1.  Right total knee arthroplasty in 2001.  2.  Multiple back surgeries in the past.  3.  Left knee unicompartmental arthroplasty in 2003.   The patient denies any complications with the above-mentioned surgical  procedures.   FAMILY MEDICAL HISTORY:  Mother, father, and sisters all have heart disease.  Sisters also have hypertension and diabetes.   SOCIAL HISTORY:  The patient is single and she is disabled. She  does not  smoke or use alcohol. She has got four children. She is going to have her  sister take care of her after surgery. She lives in a townhouse.   PHYSICAL EXAMINATION:  GENERAL:  The patient is a 65 year old black female,  heavyset, ambulates with a cane, has difficulty getting on and off the exam  table.  VITAL SIGNS:  Height is 5 feet 4 inches, weight is 215 pounds, blood  pressure is 152/78, pulse of 84, respirations 12, the patient is afebrile.  HEENT:  Head was normocephalic. Pupils equal, round, and reactive.  Extraocular motion intact.  NECK:  Supple. No palpable lymphadenopathy, good range of motion.  CHEST:  Lung sounds were clear and equal bilaterally. No wheezes, rales, or  rhonchi.  ABDOMEN:  Soft, nontender. Bowel sounds present. She did have some CVA  region soreness she attributes to her chronic back pain on the right.  EXTREMITIES:  Upper  extremities were symmetrically sized and shaped with  full range of motion of her shoulders, elbows, and writs.  LOWER EXTREMITIES:  Right and left hip had full extension, flexion up to 120  degrees. Internal and external rotation did cause posterior buttocks pain on  the right, no pain on the left. Right knee had a well-healed midline  incision. She had full extension, flexion back to 100 degrees. No  instability about the knee. The calf was nontender. Left knee had a medial  short incision across the anterior part of the knee, no effusion. She was  generally sore throughout. She was able to fully extend, flex it back to 100  degrees. The calf was soft and nontender. The ankles were symmetric with  good dorsi-plantar flexion.  PERIPHERAL VASCULAR:  Carotid pulses were 2+, no bruits. Radial pulses were  2+. Dorsalis pedis pulses were not palpable. Posterior tibial pulses were 2+  on the right, 1+ on the left, and no lower extremity edema.  NEUROLOGIC:  The patient was conscious, alert, and appropriate, held an easy  conversation  with the examiner. Cranial nerves II-XII were grossly intact.  She had no gross neurologic defects noted.  BREAST, RECTAL, GENITOURINARY:  Deferred at this time.   IMPRESSION:  1.  Failed left medial compartment unicompartmental arthroplasty,      questionable loosening, with chronic pain.  2.  Hypertension.  3.  Diet-controlled diabetes.  4.  Reflux disease.  5.  History of chronic bronchitis.  6.  History of migraines.  7.  History of anxiety.  8.  History of urinary retention status post bladder and skin cancer.   PLAN:  The patient will undergo all routine labs and tests prior to having  revision of her left knee medial compartment unicompartmental arthroplasty  to a left total knee arthroplasty by Dr. Darrelyn Hillock at [facility] on  November 07, 2005. The patient has been evaluated by Dr. Bascom Levels and is cleared  for this procedure.      Jamelle Rushing, P.A.    ______________________________  Georges Lynch Darrelyn Hillock, M.D.    RWK/MEDQ  D:  11/02/2005  T:  11/02/2005  Job:  [id number]

## 2011-01-20 NOTE — Discharge Summary (Signed)
[place]  Patient:    Peggy [name], Peggy [name] Visit Number: [id number] MRN: [id number]          Service Type: Attending:  Jarome Matin, M.D. Dictated by:   Jarome Matin, M.D. Adm. Date:  04/08/01 Disc. Date: 04/10/01                             Discharge Summary  ADMISSION DIAGNOSIS:  Chest pain.  DISCHARGE DIAGNOSES: 1. Chest pain, probably secondary to reflux esophagitis. 2. Knee replacement. 3. Type 2 diabetes. 4. Allergic rhinitis. 5. Hyperlipidemia. 6. Some depression.  PAST SURGICAL HISTORY:  History of surgery for bladder cancer.  HISTORY OF PRESENT ILLNESS:  This is a 65 year old African-American female with multiple medical problems.  She states that she has been having chest pain off and on for the past two weeks.  She has had surgery for bladder cancer in 1997.  The patient has also had spinal stenosis at L3 and 4, degenerative joint disease at all three levels.  She has also had a knee replacement.  Complains of a swollen left knee.  She has recurrent allergies, rhinitis.  The patient has GERD.  She is on Nexium 40 mg a day.  She started having chest tightness.  She thought it might be her GI tract.  However, the pressure and pain got worse.  She had some sweating, so she sat up all night before admission in a chair.  Some shortness of breath for which she came to the [facility] ER, and we admitted her.  PHYSICAL EXAMINATION:  VITAL SIGNS:  Temperature 99.6, pulse 90, respirations 20, blood pressure 160/90.  GENERAL:  Alert, lying in bed, stating that the pain had improved but still some pressure.  CHEST:  Clear to auscultation and percussion.  HEART:  Regular sinus rhythm.  ABDOMEN:  Soft.  No masses, no organomegaly.  Active bowel sounds.  EXTREMITIES:  She could move all of her extremities.  She had minimal ankle edema.  She had a painful, swollen left knee that was a replaced knee.  She was to see the orthopedic  surgeon, Dr. Darrelyn Hillock, this week.  LABORATORY DATA:  EKG was normal.  Hemoglobin 11.4, hematocrit 34.5.  CK was 633, CK-MB was 7.0, troponin I was 0.58.  Chest x-ray was negative.  CT of the chest was negative.  Extremities were negative for deep vein thrombosis or pulmonary embolus.  Right knee prosthesis was noted.  ALLERGIES:  She has many allergies.  Allergic to ERYTHROMYCIN, PENICILLIN, SULFA, TETRACYCLINE.  MEDICATIONS:  The patient is on Premarin, Pravachol, Nexium, Prozac, Depakote, Indocin.  She had chest pain at 1889, and she had catheterization by Dr. Lavonne Chick. Dr. Brunilda Payor did the bladder surgery, and Dr. Darrelyn Hillock did her knee at that time. The patient also is obese, and we have been trying to get her to lose weight. Last month in the office she weighed 204 pounds.  HOSPITAL COURSE:  Cardiology saw the patient and thought she was stable from a cardiac point of view.  She had noncardiac cause of chest pain so we decided to continue to treat her GERD by adding Reglan to the Nexium which seemed to improve things.  The patient was discharged home and will use Nexium as an outpatient and Protonix as an inpatient.  Put on Reglan 10 mg a.c. and h.s. Dr. Brunilda Payor wants to see her in his office for follow-up of her bladder  and any bladder problems.  I will see her in about two weeks.  The patient was fairly stable on her medications. Dictated by:   Jarome Matin, M.D. Attending:  Jarome Matin, M.D. DD:  05/09/01 TD:  05/10/01 Job: [id number]

## 2011-01-20 NOTE — Op Note (Signed)
NAME:  Peggy [name], Peggy [name]                           ACCOUNT NO.:  [id number]   MEDICAL RECORD NO.:  [id number]                   PATIENT TYPE:  AMB   LOCATION:  DAY                                  FACILITY:  [facility]   PHYSICIAN:  Lindaann Slough, M.D.               DATE OF BIRTH:  1946-08-02   DATE OF PROCEDURE:  01/29/2004  DATE OF DISCHARGE:                                 OPERATIVE REPORT   PREOPERATIVE DIAGNOSIS:  Rule out recurrent bladder tumor.   POSTOPERATIVE DIAGNOSIS:  No recurrent bladder tumor.   PROCEDURE:  Surveillance cystoscopy, bladder biopsy.   SURGEON:  Lindaann Slough, M.D.   ANESTHESIA:  General.   INDICATIONS FOR PROCEDURE:  The patient is a 65 year old female who had a  TUR of bladder tumor in 1997.  She has not had any recurrence since.  However, she had abnormal cytology in 2001.  Her last cystoscopy in 9/04 was  negative.  She is scheduled today for surveillance cystoscopy and bladder  biopsy.   DESCRIPTION OF PROCEDURE:  Under general anesthesia, the patient was prepped  and draped and placed in the dorsal lithotomy position.  A #22 Wappler  cystoscope was inserted in the bladder.  The bladder mucosa is normal.  There is no stone or tumor in the bladder.  The ureteral orifices are in  normal position and shape with clear efflux.  In bladder biopsy was done.  Then the area of biopsy was fulgurated with Bugbee electrode.  There was no  evidence of bleeding at the end of the procedure.  Then, the bladder was  irrigated with normal saline and bladder washings were sent to cytology.   The patient tolerated the procedure well and left the OR in satisfactory  condition to the post-anesthesia care unit.                                               Lindaann Slough, M.D.    MN/MEDQ  D:  01/29/2004  T:  01/30/2004  Job:  [id number]   cc:   Jarome Matin, M.D.  [address] [place]  [place] [address]  Fax: [phone]

## 2011-01-20 NOTE — Op Note (Signed)
[place]  Patient:    Peggy [name], Peggy [name]                        MRN: [id number] Proc. Date: 11/08/99 Adm. Date:  [id number] Attending:  Sandi Raveling CC:         Jarome Matin, M.D.                           Operative Report  PREOPERATIVE DIAGNOSIS:  Rule out recurrent bladder tumor.  POSTOPERATIVE DIAGNOSIS:  No recurrent bladder tumor.  PROCEDURE PERFORMED:  Cystoscopy and bladder biopsy.  SURGEON:  Lindaann Slough, M.D.  ANESTHESIA:  General.  INDICATIONS:  The patient is a 65 year old female who had a TUR of bladder tumor done about four years ago.  She has not had any recurrence since.  However, her  urine cytology showed atypical cells three months ago.  She is scheduled today or cystoscopy and bladder biopsy.  DESCRIPTION OF PROCEDURE:  Under general anesthesia, the patient was prepped and draped, and placed in the dorsal lithotomy position.  A #23 Wappler cystoscope as then inserted into the bladder.  The bladder mucosa is normal.  There is no stone or tumor in the bladder.  The ureteral orifices are in normal position and shape with clear efflux.  Then, a random bladder biopsy was done.  The area of the biopsy was then fulgurated with the Bugbee electrode.  There was no evidence of bleeding at the end of the procedure.  The bladder was then irrigated with normal saline, and bladder washings were sent for cytology.  The patient tolerated the procedure well and left the OR in satisfactory condition to the postanesthesia care unit. DD:  11/08/99 TD:  11/09/99 Job: [id number]

## 2011-01-20 NOTE — Op Note (Signed)
   Peggy [name], Peggy [name]                          ACCOUNT NO.:  [id number]   MEDICAL RECORD NO.:  [id number]                   PATIENT TYPE:  AMB   LOCATION:  DAY                                  FACILITY:  [facility]   PHYSICIAN:  Lindaann Slough, M.D.               DATE OF BIRTH:  Jan 03, 1946   DATE OF PROCEDURE:  05/09/2002  DATE OF DISCHARGE:                                 OPERATIVE REPORT   PREOPERATIVE DIAGNOSIS:  Rule out recurrent bladder tumor.   POSTOPERATIVE DIAGNOSIS:  No recurrent bladder tumor.   PROCEDURE DONE:  Cystoscopy.   SURGEON:  Lindaann Slough, M.D.   INDICATIONS:  The patient is a 65 year old female, who had a TUR bladder  tumor in October 1997.  She had a positive cytology in 1998.  Cystoscopy and  cytology have been negative since.  CT scan of the abdomen and pelvis  December 2002, showed no renal tumor and no filling defect in the bladder.  She is scheduled today for surveillance cystoscopy.   DESCRIPTION OF PROCEDURE:  Under general anesthesia, the patient was prepped  and draped and placed in the dorsal lithotomy position.  A #22 Wappler  cystoscope was inserted in the bladder.  There is no stone or tumor in the  bladder.  The ureteral orifices are in normal position and shape with clear  efflux.  The bladder was then irrigated with normal saline, and bladder  washings were sent for cytology.   The cystoscope was then removed.  A #16 Foley catheter was then inserted in  the bladder.   The patient tolerated the procedure well and left the OR in satisfactory  condition to the postanesthesia care unit.                                               Lindaann Slough, M.D.    MN/MEDQ  D:  05/09/2002  T:  05/09/2002  Job:  [id number]   cc:   Jarome Matin, M.D.  [address] [place]  [place] [address]  Fax: [phone]

## 2011-01-20 NOTE — Op Note (Signed)
[place]  Patient:    Peggy [name], Peggy [name]                        MRN: [id number] Proc. Date: 06/12/00 Adm. Date:  [id number] Disc. Date: [id number] Attending:  Lindaann Slough CC:         Jarome Matin, M.D.   Operative Report  PREOPERATIVE DIAGNOSIS:  Rule out recurrent bladder tumor.  POSTOPERATIVE DIAGNOSIS:  No recurrent bladder tumor.  OPERATION PERFORMED:  Cystoscopy, bladder biopsy.  SURGEON:  Lindaann Slough, M.D.  ANESTHESIA:  General.  INDICATIONS FOR PROCEDURE:  The patient is a 65 year old female who had a transurethral resection of bladder tumor in January of 1997.  She has not had any recurrence of the bladder tumor.  However, she had had abnormal urine cytology in November 2000.  She is scheduled today for surveillance cystoscopy.  DESCRIPTION OF PROCEDURE:  Under general anesthesia, the patient was prepped and draped and placed in the dorsal lithotomy position.  A #21 Wappler cystoscope was inserted in the bladder.  The bladder mucosa is normal.  There is no stone or tumor in the bladder.  The ureteral orifices in normal position and shape with clear efflux.  A random bladder biopsy was done.  The areas of biopsy were fulgurated with a Bugbee electrode.  Then the bladder was irrigated with normal saline and bladder washings were sent for cytology.  The cystoscope was then removed.  A #16 Foley catheter was then inserted in the bladder.  The patient tolerated the procedure well and left the operating room in satisfactory condition to post anesthesia care unit. DD:  06/12/00 TD:  06/13/00 Job: [id number]

## 2011-03-01 ENCOUNTER — Other Ambulatory Visit: Payer: Self-pay | Admitting: Nephrology

## 2011-03-01 ENCOUNTER — Ambulatory Visit
Admission: RE | Admit: 2011-03-01 | Discharge: 2011-03-01 | Disposition: A | Discharge status: Home / Self Care | Payer: Medicare Other | Source: Ambulatory Visit | Attending: Nephrology | Admitting: Nephrology

## 2011-03-01 DIAGNOSIS — R05 Cough: Secondary | ICD-10-CM

## 2011-03-01 DIAGNOSIS — R059 Cough, unspecified: Secondary | ICD-10-CM

## 2011-05-17 ENCOUNTER — Other Ambulatory Visit: Payer: Self-pay | Admitting: Nephrology

## 2011-05-17 DIAGNOSIS — Z1231 Encounter for screening mammogram for malignant neoplasm of breast: Secondary | ICD-10-CM

## 2011-05-22 ENCOUNTER — Emergency Department (HOSPITAL_COMMUNITY): Payer: Medicare Other

## 2011-05-22 ENCOUNTER — Emergency Department (HOSPITAL_COMMUNITY)
Admission: EM | Admit: 2011-05-22 | Discharge: 2011-05-22 | Disposition: A | Discharge status: Home / Self Care | Payer: Medicare Other | Attending: Emergency Medicine | Admitting: Emergency Medicine

## 2011-05-22 DIAGNOSIS — I10 Essential (primary) hypertension: Secondary | ICD-10-CM | POA: Insufficient documentation

## 2011-05-22 DIAGNOSIS — R0789 Other chest pain: Secondary | ICD-10-CM | POA: Insufficient documentation

## 2011-05-22 DIAGNOSIS — E7800 Pure hypercholesterolemia, unspecified: Secondary | ICD-10-CM | POA: Insufficient documentation

## 2011-05-22 LAB — COMPREHENSIVE METABOLIC PANEL
AST: 19 U/L (ref 0–37)
Albumin: 3.6 g/dL (ref 3.5–5.2)
Alkaline Phosphatase: 132 U/L — ABNORMAL HIGH (ref 39–117)
Chloride: 101 mEq/L (ref 96–112)
Potassium: 3.4 mEq/L — ABNORMAL LOW (ref 3.5–5.1)
Total Bilirubin: 0.5 mg/dL (ref 0.3–1.2)

## 2011-05-22 LAB — DIFFERENTIAL
Basophils Absolute: 0.1 10*3/uL (ref 0.0–0.1)
Lymphocytes Relative: 46 % (ref 12–46)
Lymphs Abs: 3.9 10*3/uL (ref 0.7–4.0)
Monocytes Absolute: 0.5 10*3/uL (ref 0.1–1.0)
Neutro Abs: 3.6 10*3/uL (ref 1.7–7.7)

## 2011-05-22 LAB — CK TOTAL AND CKMB (NOT AT ARMC)
CK, MB: 3.8 ng/mL (ref 0.3–4.0)
Relative Index: 1.3 (ref 0.0–2.5)

## 2011-05-22 LAB — POCT I-STAT TROPONIN I: Troponin i, poc: 0 ng/mL (ref 0.00–0.08)

## 2011-05-22 LAB — CBC
HCT: 37.1 % (ref 36.0–46.0)
Hemoglobin: 12.6 g/dL (ref 12.0–15.0)
MCHC: 34 g/dL (ref 30.0–36.0)
MCV: 89.8 fL (ref 78.0–100.0)

## 2011-06-09 LAB — BASIC METABOLIC PANEL
BUN: 12 mg/dL (ref 6–23)
CO2: 27 mEq/L (ref 19–32)
CO2: 30 mEq/L (ref 19–32)
CO2: 30 mEq/L (ref 19–32)
Calcium: 7.9 mg/dL — ABNORMAL LOW (ref 8.4–10.5)
Calcium: 9.9 mg/dL (ref 8.4–10.5)
Chloride: 104 mEq/L (ref 96–112)
Chloride: 99 mEq/L (ref 96–112)
Creatinine, Ser: 0.81 mg/dL (ref 0.4–1.2)
Creatinine, Ser: 0.84 mg/dL (ref 0.4–1.2)
GFR calc Af Amer: >60 mL/min (ref >60)
Glucose, Bld: 115 mg/dL — ABNORMAL HIGH (ref 70–99)
Glucose, Bld: 88 mg/dL (ref 70–99)
Sodium: 132 mEq/L — ABNORMAL LOW (ref 135–145)
Sodium: 137 mEq/L (ref 135–145)

## 2011-06-09 LAB — CBC
HCT: 25.6 % — ABNORMAL LOW (ref 36.0–46.0)
Hemoglobin: 8.8 g/dL — ABNORMAL LOW (ref 12.0–15.0)
MCHC: 32.7 g/dL (ref 30.0–36.0)
MCHC: 33.4 g/dL (ref 30.0–36.0)
MCV: 90.6 fL (ref 78.0–100.0)
Platelets: 184 10*3/uL (ref 150–400)
RBC: 2.91 MIL/uL — ABNORMAL LOW (ref 3.87–5.11)
RDW: 13.9 % (ref 11.5–15.5)
RDW: 13.9 % (ref 11.5–15.5)
WBC: 10.5 10*3/uL (ref 4.0–10.5)

## 2011-06-09 LAB — SYNOVIAL CELL COUNT + DIFF, W/ CRYSTALS
Eosinophils-Synovial: 0 % (ref 0–1)
Lymphocytes-Synovial Fld: 14 % (ref 0–20)
Monocyte-Macrophage-Synovial Fluid: 84 % (ref 50–90)
Neutrophil, Synovial: 2 % (ref 0–25)
WBC, Synovial: 18 /mm3 (ref 0–200)

## 2011-06-09 LAB — DIFFERENTIAL
Basophils Absolute: 0.1 10*3/uL (ref 0.0–0.1)
Basophils Relative: 1 % (ref 0–1)
Monocytes Absolute: 0.7 10*3/uL (ref 0.1–1.0)
Neutro Abs: 4.7 10*3/uL (ref 1.7–7.7)
Neutrophils Relative %: 51 % (ref 43–77)

## 2011-06-09 LAB — URINALYSIS, ROUTINE W REFLEX MICROSCOPIC
Bilirubin Urine: NEGATIVE
Ketones, ur: NEGATIVE mg/dL
Nitrite: NEGATIVE
pH: 6.5 (ref 5.0–8.0)

## 2011-06-09 LAB — GRAM STAIN

## 2011-06-09 LAB — BODY FLUID CULTURE: Culture: NO GROWTH

## 2011-06-09 LAB — TYPE AND SCREEN: Antibody Screen: NEGATIVE

## 2011-06-09 LAB — ABO/RH: ABO/RH(D): A POS

## 2011-06-09 LAB — URINE MICROSCOPIC-ADD ON

## 2011-06-09 LAB — PROTIME-INR: INR: 1 (ref 0.00–1.49)

## 2011-06-09 LAB — ANAEROBIC CULTURE

## 2011-06-09 LAB — APTT: aPTT: 24 seconds (ref 24–37)

## 2011-06-21 ENCOUNTER — Ambulatory Visit
Admission: RE | Admit: 2011-06-21 | Discharge: 2011-06-21 | Disposition: A | Discharge status: Home / Self Care | Payer: Medicare Other | Source: Ambulatory Visit | Attending: Nephrology | Admitting: Nephrology

## 2011-06-21 DIAGNOSIS — Z1231 Encounter for screening mammogram for malignant neoplasm of breast: Secondary | ICD-10-CM

## 2011-08-25 ENCOUNTER — Other Ambulatory Visit (HOSPITAL_COMMUNITY): Payer: Self-pay | Admitting: Orthopedic Surgery

## 2011-08-25 DIAGNOSIS — Z96659 Presence of unspecified artificial knee joint: Secondary | ICD-10-CM

## 2011-08-25 DIAGNOSIS — M25562 Pain in left knee: Secondary | ICD-10-CM

## 2011-08-25 DIAGNOSIS — T84038A Mechanical loosening of other internal prosthetic joint, initial encounter: Secondary | ICD-10-CM

## 2011-09-07 ENCOUNTER — Ambulatory Visit (HOSPITAL_COMMUNITY)
Admission: RE | Admit: 2011-09-07 | Discharge: 2011-09-07 | Disposition: A | Discharge status: Home / Self Care | Payer: Medicare Other | Source: Ambulatory Visit | Attending: Orthopedic Surgery | Admitting: Orthopedic Surgery

## 2011-09-07 ENCOUNTER — Encounter (HOSPITAL_COMMUNITY)
Admission: RE | Admit: 2011-09-07 | Discharge: 2011-09-07 | Disposition: A | Discharge status: Home / Self Care | Payer: Medicare Other | Source: Ambulatory Visit | Attending: Orthopedic Surgery | Admitting: Orthopedic Surgery

## 2011-09-07 DIAGNOSIS — Z96659 Presence of unspecified artificial knee joint: Secondary | ICD-10-CM

## 2011-09-07 DIAGNOSIS — M25562 Pain in left knee: Secondary | ICD-10-CM

## 2011-09-07 DIAGNOSIS — T84038A Mechanical loosening of other internal prosthetic joint, initial encounter: Secondary | ICD-10-CM

## 2011-09-07 DIAGNOSIS — M25569 Pain in unspecified knee: Secondary | ICD-10-CM | POA: Insufficient documentation

## 2011-09-07 MED ORDER — TECHNETIUM TC 99M MEDRONATE IV KIT
25.0000 | PACK | Freq: Once | INTRAVENOUS | Status: AC | PRN
  Administered 2011-09-07: 25 via INTRAVENOUS

## 2011-12-15 ENCOUNTER — Other Ambulatory Visit: Payer: Self-pay | Admitting: Nephrology

## 2011-12-15 ENCOUNTER — Ambulatory Visit
Admission: RE | Admit: 2011-12-15 | Discharge: 2011-12-15 | Disposition: A | Discharge status: Home / Self Care | Payer: Medicare Other | Source: Ambulatory Visit | Attending: Nephrology | Admitting: Nephrology

## 2011-12-15 DIAGNOSIS — R519 Headache, unspecified: Secondary | ICD-10-CM

## 2012-04-12 ENCOUNTER — Ambulatory Visit
Admission: RE | Admit: 2012-04-12 | Discharge: 2012-04-12 | Disposition: A | Discharge status: Home / Self Care | Payer: Medicare Other | Source: Ambulatory Visit | Attending: Nephrology | Admitting: Nephrology

## 2012-04-12 ENCOUNTER — Other Ambulatory Visit: Payer: Self-pay | Admitting: Nephrology

## 2012-04-12 DIAGNOSIS — R059 Cough, unspecified: Secondary | ICD-10-CM

## 2012-04-12 DIAGNOSIS — R05 Cough: Secondary | ICD-10-CM

## 2012-04-15 ENCOUNTER — Emergency Department (HOSPITAL_COMMUNITY): Payer: Medicare Other

## 2012-04-15 ENCOUNTER — Emergency Department (HOSPITAL_COMMUNITY)
Admit: 2012-04-15 | Discharge: 2012-04-15 | Disposition: A | Discharge status: Home / Self Care | Payer: Medicare Other | Attending: Emergency Medicine | Admitting: Emergency Medicine

## 2012-04-15 ENCOUNTER — Encounter (HOSPITAL_COMMUNITY): Payer: Self-pay | Admitting: Emergency Medicine

## 2012-04-15 DIAGNOSIS — R0602 Shortness of breath: Secondary | ICD-10-CM | POA: Insufficient documentation

## 2012-04-15 DIAGNOSIS — IMO0001 Reserved for inherently not codable concepts without codable children: Secondary | ICD-10-CM | POA: Insufficient documentation

## 2012-04-15 DIAGNOSIS — R509 Fever, unspecified: Secondary | ICD-10-CM | POA: Insufficient documentation

## 2012-04-15 DIAGNOSIS — R079 Chest pain, unspecified: Secondary | ICD-10-CM | POA: Insufficient documentation

## 2012-04-15 HISTORY — DX: Essential (primary) hypertension: I10

## 2012-04-15 HISTORY — DX: Pure hypercholesterolemia, unspecified: E78.00

## 2012-04-15 LAB — CBC
Hemoglobin: 12.2 g/dL (ref 12.0–15.0)
MCHC: 32.4 g/dL (ref 30.0–36.0)
RDW: 15.6 % — ABNORMAL HIGH (ref 11.5–15.5)

## 2012-04-15 LAB — COMPREHENSIVE METABOLIC PANEL
ALT: 16 U/L (ref 0–35)
AST: 22 U/L (ref 0–37)
Albumin: 4 g/dL (ref 3.5–5.2)
Alkaline Phosphatase: 131 U/L — ABNORMAL HIGH (ref 39–117)
Glucose, Bld: 94 mg/dL (ref 70–99)
Potassium: 4 mEq/L (ref 3.5–5.1)
Sodium: 140 mEq/L (ref 135–145)
Total Protein: 8 g/dL (ref 6.0–8.3)

## 2012-04-15 NOTE — ED Notes (Addendum)
Pt c/o generalized CP that started on right side and moved across starting today with SOB; pt c/o generalized aches with fever x 1 week

## 2012-05-21 ENCOUNTER — Other Ambulatory Visit: Payer: Self-pay | Admitting: Nephrology

## 2012-05-21 DIAGNOSIS — Z1231 Encounter for screening mammogram for malignant neoplasm of breast: Secondary | ICD-10-CM

## 2012-06-05 ENCOUNTER — Ambulatory Visit
Admission: RE | Admit: 2012-06-05 | Discharge: 2012-06-05 | Disposition: A | Discharge status: Home / Self Care | Payer: Medicare Other | Source: Ambulatory Visit | Attending: Nephrology | Admitting: Nephrology

## 2012-06-05 ENCOUNTER — Other Ambulatory Visit: Payer: Self-pay | Admitting: Nephrology

## 2012-06-05 DIAGNOSIS — R52 Pain, unspecified: Secondary | ICD-10-CM

## 2012-06-24 ENCOUNTER — Ambulatory Visit
Admission: RE | Admit: 2012-06-24 | Discharge: 2012-06-24 | Disposition: A | Discharge status: Home / Self Care | Payer: Medicare Other | Source: Ambulatory Visit | Attending: Nephrology | Admitting: Nephrology

## 2012-06-24 DIAGNOSIS — Z1231 Encounter for screening mammogram for malignant neoplasm of breast: Secondary | ICD-10-CM

## 2012-11-13 ENCOUNTER — Emergency Department (HOSPITAL_COMMUNITY)
Admission: EM | Admit: 2012-11-13 | Discharge: 2012-11-13 | Disposition: A | Discharge status: Home / Self Care | Payer: Medicare Other | Attending: Emergency Medicine | Admitting: Emergency Medicine

## 2012-11-13 ENCOUNTER — Encounter (HOSPITAL_COMMUNITY): Payer: Self-pay | Admitting: *Deleted

## 2012-11-13 ENCOUNTER — Emergency Department (HOSPITAL_COMMUNITY): Payer: Medicare Other

## 2012-11-13 DIAGNOSIS — R509 Fever, unspecified: Secondary | ICD-10-CM | POA: Insufficient documentation

## 2012-11-13 DIAGNOSIS — Z7982 Long term (current) use of aspirin: Secondary | ICD-10-CM | POA: Insufficient documentation

## 2012-11-13 DIAGNOSIS — N390 Urinary tract infection, site not specified: Secondary | ICD-10-CM | POA: Insufficient documentation

## 2012-11-13 DIAGNOSIS — Z79899 Other long term (current) drug therapy: Secondary | ICD-10-CM | POA: Insufficient documentation

## 2012-11-13 DIAGNOSIS — Z9889 Other specified postprocedural states: Secondary | ICD-10-CM | POA: Insufficient documentation

## 2012-11-13 DIAGNOSIS — E7800 Pure hypercholesterolemia, unspecified: Secondary | ICD-10-CM | POA: Insufficient documentation

## 2012-11-13 DIAGNOSIS — M549 Dorsalgia, unspecified: Secondary | ICD-10-CM

## 2012-11-13 DIAGNOSIS — I10 Essential (primary) hypertension: Secondary | ICD-10-CM | POA: Insufficient documentation

## 2012-11-13 DIAGNOSIS — R3915 Urgency of urination: Secondary | ICD-10-CM | POA: Insufficient documentation

## 2012-11-13 DIAGNOSIS — M545 Low back pain, unspecified: Secondary | ICD-10-CM | POA: Insufficient documentation

## 2012-11-13 DIAGNOSIS — R109 Unspecified abdominal pain: Secondary | ICD-10-CM | POA: Insufficient documentation

## 2012-11-13 DIAGNOSIS — R000 Tachycardia, unspecified: Secondary | ICD-10-CM | POA: Insufficient documentation

## 2012-11-13 DIAGNOSIS — R300 Dysuria: Secondary | ICD-10-CM | POA: Insufficient documentation

## 2012-11-13 DIAGNOSIS — R350 Frequency of micturition: Secondary | ICD-10-CM | POA: Insufficient documentation

## 2012-11-13 DIAGNOSIS — R209 Unspecified disturbances of skin sensation: Secondary | ICD-10-CM | POA: Insufficient documentation

## 2012-11-13 LAB — URINALYSIS, ROUTINE W REFLEX MICROSCOPIC
Hgb urine dipstick: NEGATIVE
Protein, ur: NEGATIVE mg/dL
Specific Gravity, Urine: 1.026 (ref 1.005–1.030)
Urobilinogen, UA: 0.2 mg/dL (ref 0.0–1.0)

## 2012-11-13 LAB — COMPREHENSIVE METABOLIC PANEL
AST: 24 U/L (ref 0–37)
Albumin: 3.6 g/dL (ref 3.5–5.2)
BUN: 13 mg/dL (ref 6–23)
CO2: 25 mEq/L (ref 19–32)
Calcium: 9.5 mg/dL (ref 8.4–10.5)
Creatinine, Ser: 0.79 mg/dL (ref 0.50–1.10)
GFR calc non Af Amer: 83 mL/min — ABNORMAL LOW (ref >90)
Total Bilirubin: 0.4 mg/dL (ref 0.3–1.2)

## 2012-11-13 LAB — CBC WITH DIFFERENTIAL/PLATELET
Basophils Absolute: 0.1 10*3/uL (ref 0.0–0.1)
Basophils Relative: 2 % — ABNORMAL HIGH (ref 0–1)
Eosinophils Relative: 2 % (ref 0–5)
HCT: 33.3 % — ABNORMAL LOW (ref 36.0–46.0)
Hemoglobin: 11 g/dL — ABNORMAL LOW (ref 12.0–15.0)
MCHC: 33 g/dL (ref 30.0–36.0)
MCV: 86.3 fL (ref 78.0–100.0)
Monocytes Absolute: 0.4 10*3/uL (ref 0.1–1.0)
Monocytes Relative: 5 % (ref 3–12)
RDW: 14.9 % (ref 11.5–15.5)

## 2012-11-13 LAB — URINE MICROSCOPIC-ADD ON

## 2012-11-13 MED ORDER — CIPROFLOXACIN HCL 500 MG PO TABS: 500.0000 mg | ORAL_TABLET | Freq: Two times a day (BID) | ORAL | Status: DC

## 2012-11-13 MED ORDER — DIAZEPAM 5 MG PO TABS: 5.0000 mg | ORAL_TABLET | Freq: Four times a day (QID) | ORAL | Status: DC | PRN

## 2012-11-13 MED ORDER — ONDANSETRON HCL 4 MG/2ML IJ SOLN
4.0000 mg | Freq: Once | INTRAMUSCULAR | Status: AC
  Administered 2012-11-13: 4 mg via INTRAVENOUS
  Filled 2012-11-13: qty 2

## 2012-11-13 MED ORDER — SODIUM CHLORIDE 0.9 % IV BOLUS (SEPSIS)
1000.0000 mL | Freq: Once | INTRAVENOUS | Status: AC
  Administered 2012-11-13: 1000 mL via INTRAVENOUS

## 2012-11-13 MED ORDER — MORPHINE SULFATE 4 MG/ML IJ SOLN
4.0000 mg | Freq: Once | INTRAMUSCULAR | Status: AC
  Administered 2012-11-13: 4 mg via INTRAVENOUS
  Filled 2012-11-13: qty 1

## 2012-11-13 MED ORDER — HYDROCODONE-ACETAMINOPHEN 5-325 MG PO TABS: 0.5000 | ORAL_TABLET | Freq: Four times a day (QID) | ORAL | Status: DC | PRN

## 2012-11-13 NOTE — ED Provider Notes (Signed)
History     CSN: [id number]  Arrival date & time 11/13/12  1056   First MD Initiated Contact with Patient 11/13/12 1150      Chief Complaint  Patient presents with  . Back Pain    (Consider location/radiation/quality/duration/timing/severity/associated sxs/prior treatment) Patient is a 67 y.o. female presenting with back pain. The history is provided by the patient.  Back Pain Location:  Lumbar spine Quality:  Aching, cramping and shooting Radiates to: was radiating down the left leg but that has gone away. Pain severity:  Severe Pain is:  Same all the time Onset quality:  Gradual Duration:  1 week Timing:  Constant Progression:  Worsening Chronicity:  New Context comment:  States started this last week while her power was out Relieved by:  Nothing Worsened by:  Twisting, urination, movement and coughing Ineffective treatments:  Ibuprofen and OTC medications Associated symptoms: abdominal pain, dysuria, fever, leg pain and numbness   Associated symptoms: no bladder incontinence, no bowel incontinence, no paresthesias, no perianal numbness and no weakness   Associated symptoms comment:  Recent urinary urgency, frequency and foul smell to urine.  Intermittent N/V which ceased on Sunday. Risk factors: no recent surgery   Risk factors comment:  Prior hx of back surgery.  also bladder reconstruction and ongoing bladder issues.   Past Medical History  Diagnosis Date  . Hypertension   . Hypercholesteremia     Past Surgical History  Procedure Laterality Date  . Back surgery    . Replacement total knee bilateral      History reviewed. No pertinent family history.  History  Substance Use Topics  . Smoking status: Never Smoker   . Smokeless tobacco: Not on file  . Alcohol Use: No    OB History   Grav Para Term Preterm Abortions TAB SAB Ect Mult Living                  Review of Systems  Constitutional: Positive for fever.  Gastrointestinal: Positive for  abdominal pain. Negative for bowel incontinence.  Genitourinary: Positive for dysuria. Negative for bladder incontinence.  Musculoskeletal: Positive for back pain.  Neurological: Positive for numbness. Negative for weakness and paresthesias.  All other systems reviewed and are negative.    Allergies  Clindamycin/lincomycin; Erythromycin base; Keflex; Penicillins; Sulfa antibiotics; and Tetracyclines & related  Home Medications   Current Outpatient Rx  Name  Route  Sig  Dispense  Refill  . aspirin 325 MG tablet   Oral   Take 325 mg by mouth daily as needed. For chest pain         . divalproex (DEPAKOTE ER) 500 MG 24 hr tablet   Oral   Take 500 mg by mouth at bedtime.         Marland Kitchen FLUoxetine (PROZAC) 40 MG capsule   Oral   Take 40 mg by mouth daily.         . simvastatin (ZOCOR) 80 MG tablet   Oral   Take 80 mg by mouth at bedtime.           BP 163/102  Pulse 111  Temp(Src) 98.4 F (36.9 C) (Oral)  Resp 16  SpO2 99%  Physical Exam  Nursing note and vitals reviewed. Constitutional: She is oriented to person, place, and time. She appears well-developed and well-nourished. No distress.  HENT:  Head: Normocephalic and atraumatic.  Mouth/Throat: Oropharynx is clear and moist.  Eyes: Conjunctivae and EOM are normal. Pupils are equal, round, and reactive  to light.  Neck: Normal range of motion. Neck supple.  Cardiovascular: Regular rhythm and intact distal pulses.  Tachycardia present.   No murmur heard. Pulmonary/Chest: Effort normal and breath sounds normal. No respiratory distress. She has no wheezes. She has no rales.  Abdominal: Soft. Normal appearance. She exhibits no distension. There is tenderness in the suprapubic area. There is CVA tenderness. There is no rebound and no guarding.  Musculoskeletal: Normal range of motion. She exhibits no edema and no tenderness.       Lumbar back: She exhibits pain and spasm.       Back:  Neurological: She is alert and  oriented to person, place, and time. She has normal strength. No sensory deficit.  Reflex Scores:      Patellar reflexes are 2+ on the right side and 2+ on the left side. Skin: Skin is warm and dry. No rash noted. No erythema.  Psychiatric: She has a normal mood and affect. Her behavior is normal.    ED Course  Procedures (including critical care time)  Labs Reviewed  CBC WITH DIFFERENTIAL - Abnormal; Notable for the following:    RBC 3.86 (*)    Hemoglobin 11.0 (*)    HCT 33.3 (*)    Basophils Relative 2 (*)    All other components within normal limits  COMPREHENSIVE METABOLIC PANEL - Abnormal; Notable for the following:    GFR calc non Af Amer 83 (*)    All other components within normal limits  URINALYSIS, ROUTINE W REFLEX MICROSCOPIC - Abnormal; Notable for the following:    APPearance HAZY (*)    Bilirubin Urine SMALL (*)    Ketones, ur 15 (*)    Leukocytes, UA SMALL (*)    All other components within normal limits  URINE MICROSCOPIC-ADD ON - Abnormal; Notable for the following:    Casts HYALINE CASTS (*)    All other components within normal limits   Dg Lumbar Spine Complete  11/13/2012  *RADIOLOGY REPORT*  Clinical Data: Back pain with radiation into legs. No trauma history submitted.  LUMBAR SPINE - COMPLETE 4+ VIEW  Comparison: 06/05/2012  Findings: Five lumbar type vertebral bodies.  Sacroiliac joints are symmetric.  Maintenance of vertebral body height and alignment. Multilevel spondylosis again identified.  Degenerative disc disease is most marked at the L5-S1 level.  There is also lower thoracic spondylosis.  IMPRESSION: Multilevel spondylosis, without acute osseous finding.   Original Report Authenticated By: Jeronimo Greaves, M.D.      No diagnosis found.    MDM   Patient complaining of bilateral back pain in addition dysuria, fever, nausea and intermittent diarrhea. She has a history of back surgery and prior lumbar issues and earlier was having pain radiating down  the left leg which has resolved. She has no bladder or bowel incontinence. She has a long history of urinary issues do to a bladder reconstruction. She was seen by Dr. Brunilda Payor last week and had a urine done which was normal. Today she is mildly tachycardic but tender in the CVA region as well as the suprapubic region. She is neurovascularly intact and feel this is more urinary related versus lumbar radiculopathy. CBC, CMP, UA and lumbar films pending. Patient denied any trauma prior to this occurring.  Pt able to ambulate without difficulty with her cane.   2:24 PM Labs wnl except for mild UTI and given hx will treat with cipro.  Plain film unremarkable.  Will d/c home with pain control.  Gwyneth Sprout, MD 11/13/12 1442

## 2012-11-13 NOTE — ED Notes (Signed)
Pt reports having back pain radiating down bilateral legs that started Thursday and has worsening since that time.  Pt denies fall, injury.  Denies incontinence.  Pt reports having hx of ruptured disc and states that it feels the same.  Pt ambulatory with a cane.

## 2012-11-29 ENCOUNTER — Encounter (HOSPITAL_COMMUNITY): Payer: Self-pay | Admitting: *Deleted

## 2012-11-29 ENCOUNTER — Emergency Department (INDEPENDENT_AMBULATORY_CARE_PROVIDER_SITE_OTHER)
Admission: EM | Admit: 2012-11-29 | Discharge: 2012-11-29 | Disposition: A | Discharge status: Home / Self Care | Payer: Medicaid Other | Source: Home / Self Care | Attending: Family Medicine | Admitting: Family Medicine

## 2012-11-29 ENCOUNTER — Emergency Department (INDEPENDENT_AMBULATORY_CARE_PROVIDER_SITE_OTHER): Payer: Medicaid Other

## 2012-11-29 DIAGNOSIS — S7010XA Contusion of unspecified thigh, initial encounter: Secondary | ICD-10-CM

## 2012-11-29 DIAGNOSIS — S7002XA Contusion of left hip, initial encounter: Secondary | ICD-10-CM

## 2012-11-29 MED ORDER — TRAMADOL HCL 50 MG PO TABS: 50.0000 mg | ORAL_TABLET | Freq: Four times a day (QID) | ORAL | Status: DC | PRN

## 2012-11-29 NOTE — ED Provider Notes (Signed)
History     CSN: [id number]  Arrival date & time 11/29/12  1131   First MD Initiated Contact with Patient 11/29/12 1144      Chief Complaint  Patient presents with  . Fall    (Consider location/radiation/quality/duration/timing/severity/associated sxs/prior treatment) Patient is a 67 y.o. female presenting with fall. The history is provided by the patient.  Fall The accident occurred 3 to 5 hours ago. The fall occurred while walking (pt reports falling in house this am , pain in left leg, was seen 3/12 for back pain with x-rays done, spondylosis., walking with cane.). She landed on carpet. The point of impact was the left hip. The pain is present in the left hip. The pain is mild. She was not ambulatory at the scene. Pertinent negatives include no loss of consciousness. The symptoms are aggravated by ambulation.    Past Medical History  Diagnosis Date  . Hypertension   . Hypercholesteremia     Past Surgical History  Procedure Laterality Date  . Back surgery    . Replacement total knee bilateral      No family history on file.  History  Substance Use Topics  . Smoking status: Never Smoker   . Smokeless tobacco: Not on file  . Alcohol Use: No    OB History   Grav Para Term Preterm Abortions TAB SAB Ect Mult Living                  Review of Systems  Constitutional: Negative.   Gastrointestinal: Negative.   Genitourinary: Negative.   Musculoskeletal: Positive for back pain and gait problem. Negative for joint swelling.  Skin: Negative.   Neurological: Negative for loss of consciousness.    Allergies  Clindamycin/lincomycin; Erythromycin base; Keflex; Penicillins; Sulfa antibiotics; and Tetracyclines & related  Home Medications   Current Outpatient Rx  Name  Route  Sig  Dispense  Refill  . aspirin 325 MG tablet   Oral   Take 325 mg by mouth daily as needed. For chest pain         . ciprofloxacin (CIPRO) 500 MG tablet   Oral   Take 1 tablet (500 mg  total) by mouth every 12 (twelve) hours.   10 tablet   0   . divalproex (DEPAKOTE ER) 500 MG 24 hr tablet   Oral   Take 1,000 mg by mouth at bedtime.          Marland Kitchen FLUoxetine (PROZAC) 40 MG capsule   Oral   Take 40 mg by mouth daily.         Marland Kitchen omeprazole (PRILOSEC) 40 MG capsule   Oral   Take 40 mg by mouth daily.         . simvastatin (ZOCOR) 80 MG tablet   Oral   Take 80 mg by mouth at bedtime.         . traMADol (ULTRAM) 50 MG tablet   Oral   Take 1 tablet (50 mg total) by mouth every 6 (six) hours as needed for pain.   15 tablet   0   . trandolapril (MAVIK) 2 MG tablet   Oral   Take 2 mg by mouth daily.           BP 140/64  Pulse 76  Temp(Src) 98.3 F (36.8 C) (Oral)  Resp 16  SpO2 98%  Physical Exam  Nursing note and vitals reviewed. Constitutional: She is oriented to person, place, and time. She appears well-developed and well-nourished.  Abdominal: Soft. Bowel sounds are normal.  Musculoskeletal: She exhibits tenderness.       Left hip: She exhibits tenderness. She exhibits no swelling and no deformity.       Legs: Neurological: She is alert and oriented to person, place, and time.  Skin: Skin is warm and dry.  No visible trauma,    ED Course  Procedures (including critical care time)  Labs Reviewed - No data to display Dg Hip Complete Left  11/29/2012  *RADIOLOGY REPORT*  Clinical Data: Fall, left hip pain  LEFT HIP - COMPLETE 2+ VIEW  Comparison: None.  Findings: Left hip appears intact.  No displaced fracture or malalignment.  Trabecular pattern symmetric.  Femoral head contours are unremarkable.  Degenerative changes of the lower lumbar spine. Bony pelvis appears intact.  IMPRESSION: No acute osseous finding   Original Report Authenticated By: Judie Petit. Shick, M.D.      1. Contusion, hip and thigh, left, initial encounter       MDM  X-rays reviewed and report per radiologist.        Linna Hoff, MD 11/29/12 [phone]

## 2012-11-29 NOTE — ED Notes (Signed)
Pt  Reports   She  Larey Seat  This  Am    She  inj  Her  l  Hip  From  The  Hip  Down       She  Ambulated  To  Room  Slowly  With  The  Help  Of a  Bobby Rumpf e reports  Pain is  Worse  On  movement She  Did  Not  Black    Out

## 2012-12-10 ENCOUNTER — Emergency Department (INDEPENDENT_AMBULATORY_CARE_PROVIDER_SITE_OTHER)
Admission: EM | Admit: 2012-12-10 | Discharge: 2012-12-10 | Disposition: A | Discharge status: Home / Self Care | Payer: Medicare Other | Source: Home / Self Care | Attending: Family Medicine | Admitting: Family Medicine

## 2012-12-10 ENCOUNTER — Encounter (HOSPITAL_COMMUNITY): Payer: Self-pay

## 2012-12-10 DIAGNOSIS — I10 Essential (primary) hypertension: Secondary | ICD-10-CM | POA: Diagnosis present

## 2012-12-10 DIAGNOSIS — M545 Low back pain, unspecified: Secondary | ICD-10-CM | POA: Diagnosis present

## 2012-12-10 DIAGNOSIS — M4800 Spinal stenosis, site unspecified: Secondary | ICD-10-CM | POA: Diagnosis present

## 2012-12-10 DIAGNOSIS — S7010XA Contusion of unspecified thigh, initial encounter: Secondary | ICD-10-CM

## 2012-12-10 DIAGNOSIS — M5430 Sciatica, unspecified side: Secondary | ICD-10-CM | POA: Diagnosis present

## 2012-12-10 DIAGNOSIS — E785 Hyperlipidemia, unspecified: Secondary | ICD-10-CM

## 2012-12-10 DIAGNOSIS — M51369 Other intervertebral disc degeneration, lumbar region without mention of lumbar back pain or lower extremity pain: Secondary | ICD-10-CM | POA: Diagnosis present

## 2012-12-10 DIAGNOSIS — M5136 Other intervertebral disc degeneration, lumbar region: Secondary | ICD-10-CM | POA: Diagnosis present

## 2012-12-10 DIAGNOSIS — M5432 Sciatica, left side: Secondary | ICD-10-CM

## 2012-12-10 HISTORY — DX: Gastro-esophageal reflux disease without esophagitis: K21.9

## 2012-12-10 HISTORY — DX: Nummular dermatitis: L30.0

## 2012-12-10 HISTORY — DX: Malignant neoplasm of bladder, unspecified: C67.9

## 2012-12-10 HISTORY — DX: Sciatica, unspecified side: M54.30

## 2012-12-10 HISTORY — DX: Unspecified malignant neoplasm of skin, unspecified: C44.90

## 2012-12-10 LAB — COMPREHENSIVE METABOLIC PANEL
Albumin: 4 g/dL (ref 3.5–5.2)
Alkaline Phosphatase: 129 U/L — ABNORMAL HIGH (ref 39–117)
BUN: 12 mg/dL (ref 6–23)
Calcium: 9.9 mg/dL (ref 8.4–10.5)
Creatinine, Ser: 0.74 mg/dL (ref 0.50–1.10)
GFR calc Af Amer: >90 mL/min (ref >90)
Glucose, Bld: 87 mg/dL (ref 70–99)
Total Protein: 8.4 g/dL — ABNORMAL HIGH (ref 6.0–8.3)

## 2012-12-10 LAB — LIPID PANEL
Cholesterol: 245 mg/dL — ABNORMAL HIGH (ref 0–200)
HDL: 58 mg/dL (ref >39)
Total CHOL/HDL Ratio: 4.2 RATIO
Triglycerides: 173 mg/dL — ABNORMAL HIGH (ref <150)
VLDL: 35 mg/dL (ref 0–40)

## 2012-12-10 LAB — CBC
HCT: 35.8 % — ABNORMAL LOW (ref 36.0–46.0)
Hemoglobin: 11.7 g/dL — ABNORMAL LOW (ref 12.0–15.0)
MCH: 28.3 pg (ref 26.0–34.0)
MCHC: 32.7 g/dL (ref 30.0–36.0)
MCV: 86.7 fL (ref 78.0–100.0)
RDW: 15.3 % (ref 11.5–15.5)

## 2012-12-10 LAB — HEMOGLOBIN A1C: Mean Plasma Glucose: 97 mg/dL (ref <117)

## 2012-12-10 MED ORDER — METHYLPREDNISOLONE SODIUM SUCC 125 MG IJ SOLR
125.0000 mg | Freq: Once | INTRAMUSCULAR | Status: AC
  Administered 2012-12-10: 125 mg via INTRAMUSCULAR

## 2012-12-10 MED ORDER — METHYLPREDNISOLONE SODIUM SUCC 125 MG IJ SOLR
INTRAMUSCULAR | Status: AC
  Filled 2012-12-10: qty 2

## 2012-12-10 NOTE — ED Notes (Signed)
Left leg and left hip pain.

## 2012-12-10 NOTE — ED Provider Notes (Addendum)
History     CSN: [id number]  Arrival date & time 12/10/12  1014   First MD Initiated Contact with Patient 12/10/12 1052     CC:  Establish   HPI Pt says that she is here to establish for primary care.  She has HTN and HLE and reports stable depression and chronic bronchitis.  She has GERD that has been stable. . She says that she is having controlled GERD symptoms.  Pt says that she had only been getting labs annually and I explained to her that she needed to have it done more often on   Past Medical History  Diagnosis Date  . Hypertension   . Hypercholesteremia        Arthritis       Chronic Bronchitis       Depression       GERD   Past Surgical History  Procedure Laterality Date  . Back surgery    . Replacement total knee bilateral      History reviewed. No pertinent family history.  History  Substance Use Topics  . Smoking status: Never Smoker   . Smokeless tobacco: Not on file  . Alcohol Use: No    OB History   Grav Para Term Preterm Abortions TAB SAB Ect Mult Living                 Review of Systems Constitutional: Negative.  HENT: Negative.  Respiratory: Negative.  Cardiovascular: Negative.  Gastrointestinal: Negative.  Endocrine: Negative.  Genitourinary: Negative.  Musculoskeletal: Negative.  Skin: Negative.  Allergic/Immunologic: Negative.  Neurological: Negative.  Hematological: Negative.  Psychiatric/Behavioral: Negative.  All other systems reviewed and are negative   Allergies  Clindamycin/lincomycin; Erythromycin base; Keflex; Penicillins; Sulfa antibiotics; and Tetracyclines & related  Home Medications   Current Outpatient Rx  Name  Route  Sig  Dispense  Refill  . aspirin 325 MG tablet   Oral   Take 325 mg by mouth daily as needed. For chest pain         . ciprofloxacin (CIPRO) 500 MG tablet   Oral   Take 1 tablet (500 mg total) by mouth every 12 (twelve) hours.   10 tablet   0   . divalproex (DEPAKOTE ER) 500 MG 24 hr tablet    Oral   Take 1,000 mg by mouth at bedtime.          Marland Kitchen FLUoxetine (PROZAC) 40 MG capsule   Oral   Take 40 mg by mouth daily.         Marland Kitchen omeprazole (PRILOSEC) 40 MG capsule   Oral   Take 40 mg by mouth daily.         . simvastatin (ZOCOR) 80 MG tablet   Oral   Take 80 mg by mouth at bedtime.         . traMADol (ULTRAM) 50 MG tablet   Oral   Take 1 tablet (50 mg total) by mouth every 6 (six) hours as needed for pain.   15 tablet   0   . trandolapril (MAVIK) 2 MG tablet   Oral   Take 2 mg by mouth daily.           BP 139/69  Pulse 82  Temp(Src) 99 F (37.2 C) (Oral)  Resp 18  Physical Exam Nursing note and vitals reviewed.  Constitutional: She is oriented to person, place, and time. She appears well-developed and well-nourished. No distress.  HENT:  Head: Normocephalic and atraumatic.  Eyes: Conjunctivae and EOM are normal. Pupils are equal, round, and reactive to light.  Neck: Normal range of motion. Neck supple. No JVD present. No tracheal deviation present. No thyromegaly present.  Cardiovascular: Normal rate, regular rhythm and normal heart sounds.  Pulmonary/Chest: Effort normal and breath sounds normal. No respiratory distress. She has no wheezes.  Abdominal: Soft. Bowel sounds are normal.  Musculoskeletal: pronounced limp noted.   She exhibits tenderness of left buttock and lleft hip joint and knee joint.    Lymphadenopathy:  She has no cervical adenopathy.  Neurological: She is alert and oriented to person, place, and time. She has normal reflexes.  Skin: Skin is warm and dry.  Psychiatric: She has a normal mood and affect. Her behavior is normal. Judgment and thought content normal.    ED Course  Procedures (including critical care time)  Labs Reviewed - No data to display No results found.  No diagnosis found.  MDM  IMPRESSION  Hypertension  Hyperlipidemia  Osteoarthritis  Hip Pain left   Sciatica on left   RECOMMENDATIONS /  PLAN Pt doesn't want to take any more pills. Trial of solumedrol 125 mg IM x 1 dose  CHeck labs today Send for MRI L- spine  Follow up - will need referral to neurosurg if indicated  FOLLOW UP 2 weeks   The patient was given clear instructions to go to ER or return to medical center if symptoms don't improve, worsen or new problems develop.  The patient verbalized understanding.  The patient was told to call to get lab results if they haven't heard anything in the next week.            Cleora Fleet, MD 12/10/12 1131  Marna Weniger Cyndie Mull, MD 12/10/12 1134

## 2012-12-11 ENCOUNTER — Telehealth (HOSPITAL_COMMUNITY): Payer: Self-pay

## 2012-12-11 NOTE — ED Notes (Signed)
Patient returned our call- lab results given Also gave her her son allens lab results - she is his guardian

## 2012-12-11 NOTE — Progress Notes (Signed)
Quick Note:  Please inform patient that her cholesterol levels were elevated. Recommend low fat low cholesterol diet, her vit D was a little low, Recommend that she start taking OTC calcium and Vit D supplement daily, her hemoglobin was a little low, Please ask her if she has had a colonoscopy done? I recommend that she get a screening colonoscopy if she has not had one done. Also, need to recheck labs in 4 months.   Rodney Langton, MD, CDE, FAAFP [facility] [place], [place]   ______

## 2012-12-13 ENCOUNTER — Telehealth (HOSPITAL_COMMUNITY): Payer: Self-pay

## 2012-12-13 NOTE — ED Notes (Signed)
Spoke with patient - has an appt for MRI Monday 12/16/12 at 10 am Pt is aware and she is to arrive at 9:45 am [facility]

## 2012-12-16 ENCOUNTER — Ambulatory Visit (HOSPITAL_COMMUNITY)
Admission: RE | Admit: 2012-12-16 | Discharge: 2012-12-16 | Disposition: A | Discharge status: Home / Self Care | Payer: Medicare Other | Source: Ambulatory Visit | Attending: Family Medicine | Admitting: Family Medicine

## 2012-12-16 DIAGNOSIS — M4310 Spondylolisthesis, site unspecified: Secondary | ICD-10-CM | POA: Insufficient documentation

## 2012-12-16 DIAGNOSIS — M5126 Other intervertebral disc displacement, lumbar region: Secondary | ICD-10-CM | POA: Insufficient documentation

## 2012-12-16 MED ORDER — GADOBENATE DIMEGLUMINE 529 MG/ML IV SOLN
20.0000 mL | Freq: Once | INTRAVENOUS | Status: AC
  Administered 2012-12-16: 20 mL via INTRAVENOUS

## 2012-12-24 ENCOUNTER — Emergency Department (INDEPENDENT_AMBULATORY_CARE_PROVIDER_SITE_OTHER)
Admission: EM | Admit: 2012-12-24 | Discharge: 2012-12-24 | Disposition: A | Discharge status: Home Health | Payer: Medicare Other | Source: Home / Self Care

## 2012-12-24 ENCOUNTER — Encounter (HOSPITAL_COMMUNITY): Payer: Self-pay

## 2012-12-24 DIAGNOSIS — M4800 Spinal stenosis, site unspecified: Secondary | ICD-10-CM

## 2012-12-24 DIAGNOSIS — I10 Essential (primary) hypertension: Secondary | ICD-10-CM

## 2012-12-24 DIAGNOSIS — M5432 Sciatica, left side: Secondary | ICD-10-CM

## 2012-12-24 DIAGNOSIS — M545 Low back pain, unspecified: Secondary | ICD-10-CM

## 2012-12-24 DIAGNOSIS — M5430 Sciatica, unspecified side: Secondary | ICD-10-CM

## 2012-12-24 DIAGNOSIS — M533 Sacrococcygeal disorders, not elsewhere classified: Secondary | ICD-10-CM

## 2012-12-24 MED ORDER — TRAMADOL HCL 50 MG PO TABS: 50.0000 mg | ORAL_TABLET | Freq: Four times a day (QID) | ORAL | Status: DC | PRN

## 2012-12-24 NOTE — ED Provider Notes (Signed)
History     CSN: [id number]  Arrival date & time 12/24/12  1204   First MD Initiated Contact with Patient 12/24/12 1221      Chief Complaint  Patient presents with  . Follow-up     HPI 67 Y/O Obese female with hx of HTN and recent fall over left hip with pain over left hip which radiates to the left thigh. She also informs occasional urinary incontinence. She was seen in the clinic few weeks back to establish care and an MRI of LS spine was ordered. She comes for a follow up and informs severe pain over left hip worsens with minimal movement and pressure over the side.also informs of occasional urinary incontinence since the event. She has hx of choronic back and knee  Pain with hx of knee sx  and uses cane . Denies radiation of pain to left thigh prior to recent fall.   x ray of left hip recently was negative for fracture. MRI lumbar spine commented as showeed severe/ critical L4-L5 spinal stenosis. Denies any bowel symptoms. Denies tingling or numbness of extremities.  on reviewing her imaging dated back to 2001 she has severe L4-L5 stenosis with disc bulging which seems to have unchanged on recent MRI.  Vision denies any chest pain, palpitations, shortness of breath, bone pain, nausea, vomiting, bowel symptoms.  Past Medical History  Diagnosis Date  . Hypertension   . Hypercholesteremia   . Bladder cancer   . Skin cancer   . GERD (gastroesophageal reflux disease)   . Nummular eczema     Past Surgical History  Procedure Laterality Date  . Back surgery    . Replacement total knee bilateral    . Bladder tumor excision    . Lumpectomy right      Family History  Problem Relation Age of Onset  . Heart failure Mother   . Cancer Mother   . Hypertension Mother   . Hypertension Father     History  Substance Use Topics  . Smoking status: Never Smoker   . Smokeless tobacco: Not on file  . Alcohol Use: No    OB History   Grav Para Term Preterm Abortions TAB SAB Ect Mult  Living                  Review of Systems Outlined in history of present illness Allergies  Clindamycin/lincomycin; Erythromycin base; Keflex; Penicillins; Sulfa antibiotics; and Tetracyclines & related  Home Medications   Current Outpatient Rx  Name  Route  Sig  Dispense  Refill  . aspirin 325 MG tablet   Oral   Take 325 mg by mouth daily as needed. For chest pain         . divalproex (DEPAKOTE ER) 500 MG 24 hr tablet   Oral   Take 1,000 mg by mouth at bedtime.          Marland Kitchen FLUoxetine (PROZAC) 40 MG capsule   Oral   Take 40 mg by mouth daily.         Marland Kitchen omeprazole (PRILOSEC) 40 MG capsule   Oral   Take 40 mg by mouth daily.         . simvastatin (ZOCOR) 80 MG tablet   Oral   Take 80 mg by mouth at bedtime.         . traMADol (ULTRAM) 50 MG tablet   Oral   Take 1 tablet (50 mg total) by mouth every 6 (six) hours as needed for  pain.   15 tablet   0   . trandolapril (MAVIK) 2 MG tablet   Oral   Take 2 mg by mouth daily.           BP 131/58  Pulse 85  Temp(Src) 98.4 F (36.9 C) (Oral)  Resp 18  SpO2 96%  Physical Exam Elderly female in no acute distress HEENT: No pallor, stool mucosa Chest: Clear to auscultation bilaterally, no added sounds CVS: Normal S1-S2, no murmur rub or gallop Abdomen: Soft, nontender, nondistended, bowel sounds present Extremities: Warm, no edema, CVA tenderness to palpation over the lower back wit positive SLTR, normal sensations CNS: AAO x3 ED Course  Procedures (including critical care time)  Labs Reviewed - No data to display No results found.   No diagnosis found. Assessment/plan  Followup on low back pain A recent MRI showing severe stenosis at the level of L4/L5 secondary to disc bulging. On reviewing previous MRIs dated on the back of 2001 it appears that she has chronic L4-L5 stenosis secondary to anterior disc bulging. She is on tramadol off and on and I will give her a prescription for this. I will make a  referral to neurosurgery clinic for evaluation.  Hypertension Depression stable and not on any medication.  Hyperlipidemia Continue simvastatin   MDM  Follow up in 1 month        Letzy Gullickson, MD 12/24/12 1328

## 2012-12-24 NOTE — ED Notes (Signed)
Follow up- back pain Would like results of MRI that she recently had done

## 2012-12-31 NOTE — ED Notes (Signed)
Referral faxed to [facility]

## 2013-05-20 ENCOUNTER — Ambulatory Visit
Admission: RE | Admit: 2013-05-20 | Discharge: 2013-05-20 | Disposition: A | Discharge status: Home / Self Care | Payer: Medicare Other | Source: Ambulatory Visit | Attending: Nurse Practitioner | Admitting: Nurse Practitioner

## 2013-05-20 ENCOUNTER — Other Ambulatory Visit: Payer: Self-pay | Admitting: Nurse Practitioner

## 2013-05-20 DIAGNOSIS — T1490XA Injury, unspecified, initial encounter: Secondary | ICD-10-CM

## 2013-05-20 DIAGNOSIS — R609 Edema, unspecified: Secondary | ICD-10-CM

## 2013-05-20 DIAGNOSIS — R52 Pain, unspecified: Secondary | ICD-10-CM

## 2013-05-21 ENCOUNTER — Other Ambulatory Visit: Payer: Self-pay

## 2013-05-21 DIAGNOSIS — Z1231 Encounter for screening mammogram for malignant neoplasm of breast: Secondary | ICD-10-CM

## 2013-06-26 ENCOUNTER — Ambulatory Visit
Admission: RE | Admit: 2013-06-26 | Discharge: 2013-06-26 | Disposition: A | Discharge status: Home / Self Care | Payer: Medicare Other | Source: Ambulatory Visit

## 2013-06-26 DIAGNOSIS — Z1231 Encounter for screening mammogram for malignant neoplasm of breast: Secondary | ICD-10-CM

## 2014-05-26 ENCOUNTER — Other Ambulatory Visit: Payer: Self-pay

## 2014-05-26 DIAGNOSIS — Z1231 Encounter for screening mammogram for malignant neoplasm of breast: Secondary | ICD-10-CM

## 2014-06-29 ENCOUNTER — Ambulatory Visit
Admission: RE | Admit: 2014-06-29 | Discharge: 2014-06-29 | Disposition: A | Discharge status: Home / Self Care | Payer: Medicare Other | Source: Ambulatory Visit

## 2014-06-29 ENCOUNTER — Encounter (INDEPENDENT_AMBULATORY_CARE_PROVIDER_SITE_OTHER): Payer: Self-pay

## 2014-06-29 DIAGNOSIS — Z1231 Encounter for screening mammogram for malignant neoplasm of breast: Secondary | ICD-10-CM

## 2015-02-03 HISTORY — PX: NM MYOVIEW LTD: HXRAD82

## 2015-02-18 ENCOUNTER — Observation Stay (HOSPITAL_COMMUNITY)
Admission: EM | Admit: 2015-02-18 | Discharge: 2015-02-20 | Disposition: A | Discharge status: Home / Self Care | Payer: Medicare Other | Attending: Internal Medicine | Admitting: Internal Medicine

## 2015-02-18 ENCOUNTER — Emergency Department (HOSPITAL_COMMUNITY): Payer: Medicare Other

## 2015-02-18 ENCOUNTER — Encounter (HOSPITAL_COMMUNITY): Payer: Self-pay

## 2015-02-18 DIAGNOSIS — R0602 Shortness of breath: Secondary | ICD-10-CM | POA: Insufficient documentation

## 2015-02-18 DIAGNOSIS — E785 Hyperlipidemia, unspecified: Secondary | ICD-10-CM | POA: Insufficient documentation

## 2015-02-18 DIAGNOSIS — I2510 Atherosclerotic heart disease of native coronary artery without angina pectoris: Secondary | ICD-10-CM | POA: Diagnosis not present

## 2015-02-18 DIAGNOSIS — J069 Acute upper respiratory infection, unspecified: Secondary | ICD-10-CM | POA: Diagnosis not present

## 2015-02-18 DIAGNOSIS — E669 Obesity, unspecified: Secondary | ICD-10-CM | POA: Diagnosis not present

## 2015-02-18 DIAGNOSIS — L300 Nummular dermatitis: Secondary | ICD-10-CM | POA: Diagnosis not present

## 2015-02-18 DIAGNOSIS — R079 Chest pain, unspecified: Secondary | ICD-10-CM

## 2015-02-18 DIAGNOSIS — R0789 Other chest pain: Secondary | ICD-10-CM | POA: Diagnosis not present

## 2015-02-18 DIAGNOSIS — Z79899 Other long term (current) drug therapy: Secondary | ICD-10-CM | POA: Insufficient documentation

## 2015-02-18 DIAGNOSIS — I10 Essential (primary) hypertension: Secondary | ICD-10-CM | POA: Insufficient documentation

## 2015-02-18 DIAGNOSIS — K625 Hemorrhage of anus and rectum: Secondary | ICD-10-CM | POA: Insufficient documentation

## 2015-02-18 DIAGNOSIS — Z7952 Long term (current) use of systemic steroids: Secondary | ICD-10-CM | POA: Diagnosis not present

## 2015-02-18 DIAGNOSIS — Z6836 Body mass index (BMI) 36.0-36.9, adult: Secondary | ICD-10-CM | POA: Diagnosis not present

## 2015-02-18 DIAGNOSIS — R000 Tachycardia, unspecified: Secondary | ICD-10-CM | POA: Insufficient documentation

## 2015-02-18 DIAGNOSIS — D649 Anemia, unspecified: Secondary | ICD-10-CM | POA: Diagnosis not present

## 2015-02-18 DIAGNOSIS — E782 Mixed hyperlipidemia: Secondary | ICD-10-CM | POA: Diagnosis present

## 2015-02-18 DIAGNOSIS — K219 Gastro-esophageal reflux disease without esophagitis: Secondary | ICD-10-CM | POA: Insufficient documentation

## 2015-02-18 DIAGNOSIS — Z86718 Personal history of other venous thrombosis and embolism: Secondary | ICD-10-CM | POA: Diagnosis not present

## 2015-02-18 DIAGNOSIS — E780 Pure hypercholesterolemia: Secondary | ICD-10-CM | POA: Insufficient documentation

## 2015-02-18 DIAGNOSIS — F329 Major depressive disorder, single episode, unspecified: Secondary | ICD-10-CM | POA: Diagnosis not present

## 2015-02-18 DIAGNOSIS — Z8551 Personal history of malignant neoplasm of bladder: Secondary | ICD-10-CM | POA: Insufficient documentation

## 2015-02-18 DIAGNOSIS — I2584 Coronary atherosclerosis due to calcified coronary lesion: Secondary | ICD-10-CM | POA: Diagnosis not present

## 2015-02-18 DIAGNOSIS — R042 Hemoptysis: Secondary | ICD-10-CM | POA: Diagnosis not present

## 2015-02-18 DIAGNOSIS — D509 Iron deficiency anemia, unspecified: Secondary | ICD-10-CM | POA: Insufficient documentation

## 2015-02-18 DIAGNOSIS — Z86711 Personal history of pulmonary embolism: Secondary | ICD-10-CM | POA: Diagnosis not present

## 2015-02-18 HISTORY — DX: Unspecified hearing loss, left ear: H91.92

## 2015-02-18 HISTORY — DX: Headache: R51

## 2015-02-18 HISTORY — DX: Headache, unspecified: R51.9

## 2015-02-18 HISTORY — DX: Low back pain, unspecified: M54.50

## 2015-02-18 HISTORY — DX: Depression, unspecified: F32.A

## 2015-02-18 HISTORY — DX: Unspecified osteoarthritis, unspecified site: M19.90

## 2015-02-18 HISTORY — DX: Unspecified chronic bronchitis: J42

## 2015-02-18 HISTORY — DX: Low back pain: M54.5

## 2015-02-18 HISTORY — DX: Acute embolism and thrombosis of unspecified deep veins of unspecified lower extremity: I82.409

## 2015-02-18 HISTORY — DX: Chest pain, unspecified: R07.9

## 2015-02-18 HISTORY — DX: Anxiety disorder, unspecified: F41.9

## 2015-02-18 HISTORY — DX: Other chronic pain: G89.29

## 2015-02-18 HISTORY — DX: Major depressive disorder, single episode, unspecified: F32.9

## 2015-02-18 LAB — IRON AND TIBC
IRON: 19 ug/dL — AB (ref 28–170)
Saturation Ratios: 3 % — ABNORMAL LOW (ref 10.4–31.8)
TIBC: 545 ug/dL — ABNORMAL HIGH (ref 250–450)
UIBC: 526 ug/dL

## 2015-02-18 LAB — TROPONIN I

## 2015-02-18 LAB — TSH: TSH: 1.256 u[IU]/mL (ref 0.350–4.500)

## 2015-02-18 LAB — CBC
HEMATOCRIT: 30.4 % — AB (ref 36.0–46.0)
HEMATOCRIT: 30.8 % — AB (ref 36.0–46.0)
HEMOGLOBIN: 8.8 g/dL — AB (ref 12.0–15.0)
HEMOGLOBIN: 8.8 g/dL — AB (ref 12.0–15.0)
MCH: 20.9 pg — AB (ref 26.0–34.0)
MCH: 20.9 pg — ABNORMAL LOW (ref 26.0–34.0)
MCHC: 28.9 g/dL — ABNORMAL LOW (ref 30.0–36.0)
MCHC: 28.6 g/dL — ABNORMAL LOW (ref 30.0–36.0)
MCV: 72 fL — AB (ref 78.0–100.0)
MCV: 73 fL — ABNORMAL LOW (ref 78.0–100.0)
Platelets: 409 10*3/uL — ABNORMAL HIGH (ref 150–400)
Platelets: 421 10*3/uL — ABNORMAL HIGH (ref 150–400)
RBC: 4.22 MIL/uL (ref 3.87–5.11)
RBC: 4.22 MIL/uL (ref 3.87–5.11)
RDW: 19.5 % — AB (ref 11.5–15.5)
RDW: 19.6 % — ABNORMAL HIGH (ref 11.5–15.5)
WBC: 11.1 10*3/uL — ABNORMAL HIGH (ref 4.0–10.5)
WBC: 11 10*3/uL — AB (ref 4.0–10.5)

## 2015-02-18 LAB — BASIC METABOLIC PANEL
ANION GAP: 8 (ref 5–15)
BUN: 6 mg/dL (ref 6–20)
CO2: 26 mmol/L (ref 22–32)
Calcium: 9 mg/dL (ref 8.9–10.3)
Chloride: 107 mmol/L (ref 101–111)
Creatinine, Ser: 0.83 mg/dL (ref 0.44–1.00)
GFR calc non Af Amer: >60 mL/min (ref >60)
Glucose, Bld: 90 mg/dL (ref 65–99)
POTASSIUM: 3.8 mmol/L (ref 3.5–5.1)
Sodium: 141 mmol/L (ref 135–145)

## 2015-02-18 LAB — PROTIME-INR
INR: 1.06 (ref 0.00–1.49)
Prothrombin Time: 14 seconds (ref 11.6–15.2)

## 2015-02-18 LAB — I-STAT TROPONIN, ED: TROPONIN I, POC: 0 ng/mL (ref 0.00–0.08)

## 2015-02-18 LAB — VITAMIN B12: Vitamin B-12: 337 pg/mL (ref 180–914)

## 2015-02-18 LAB — APTT: aPTT: 27 seconds (ref 24–37)

## 2015-02-18 LAB — BRAIN NATRIURETIC PEPTIDE: B NATRIURETIC PEPTIDE 5: 54 pg/mL (ref 0.0–100.0)

## 2015-02-18 LAB — POC OCCULT BLOOD, ED: FECAL OCCULT BLD: NEGATIVE

## 2015-02-18 MED ORDER — ACETAMINOPHEN 325 MG PO TABS
650.0000 mg | ORAL_TABLET | ORAL | Status: DC | PRN
  Administered 2015-02-19 – 2015-02-20 (×2): 650 mg via ORAL
  Filled 2015-02-18 (×2): qty 2

## 2015-02-18 MED ORDER — METOPROLOL TARTRATE 12.5 MG HALF TABLET
12.5000 mg | ORAL_TABLET | Freq: Two times a day (BID) | ORAL | Status: DC
  Filled 2015-02-18: qty 1

## 2015-02-18 MED ORDER — IOHEXOL 350 MG/ML SOLN
80.0000 mL | Freq: Once | INTRAVENOUS | Status: AC | PRN
  Administered 2015-02-18: 80 mL via INTRAVENOUS

## 2015-02-18 MED ORDER — DIAZEPAM 5 MG PO TABS: 5.0000 mg | ORAL_TABLET | Freq: Four times a day (QID) | ORAL | Status: DC | PRN

## 2015-02-18 MED ORDER — ATORVASTATIN CALCIUM 40 MG PO TABS
40.0000 mg | ORAL_TABLET | Freq: Every day | ORAL | Status: DC
  Filled 2015-02-18: qty 1

## 2015-02-18 MED ORDER — ASPIRIN EC 325 MG PO TBEC
325.0000 mg | DELAYED_RELEASE_TABLET | Freq: Every day | ORAL | Status: DC
  Administered 2015-02-18 – 2015-02-20 (×3): 325 mg via ORAL
  Filled 2015-02-18 (×3): qty 1

## 2015-02-18 MED ORDER — NITROGLYCERIN 0.4 MG SL SUBL: 0.4000 mg | SUBLINGUAL_TABLET | SUBLINGUAL | Status: DC | PRN

## 2015-02-18 MED ORDER — TRANDOLAPRIL 2 MG PO TABS
2.0000 mg | ORAL_TABLET | Freq: Every day | ORAL | Status: DC
  Administered 2015-02-18 – 2015-02-20 (×3): 2 mg via ORAL
  Filled 2015-02-18 (×4): qty 1

## 2015-02-18 MED ORDER — SODIUM CHLORIDE 0.9 % IV BOLUS (SEPSIS)
1000.0000 mL | Freq: Once | INTRAVENOUS | Status: AC
  Administered 2015-02-18: 1000 mL via INTRAVENOUS

## 2015-02-18 MED ORDER — DIVALPROEX SODIUM ER 500 MG PO TB24
1000.0000 mg | ORAL_TABLET | Freq: Every day | ORAL | Status: DC
  Filled 2015-02-18 (×3): qty 2

## 2015-02-18 MED ORDER — VALACYCLOVIR HCL 500 MG PO TABS
1000.0000 mg | ORAL_TABLET | Freq: Two times a day (BID) | ORAL | Status: DC
  Filled 2015-02-18: qty 2

## 2015-02-18 MED ORDER — ENSURE ENLIVE PO LIQD
237.0000 mL | Freq: Two times a day (BID) | ORAL | Status: DC
  Administered 2015-02-18 – 2015-02-20 (×4): 237 mL via ORAL

## 2015-02-18 MED ORDER — FLUOXETINE HCL 20 MG PO CAPS
40.0000 mg | ORAL_CAPSULE | Freq: Every day | ORAL | Status: DC
  Administered 2015-02-19: 40 mg via ORAL
  Filled 2015-02-18 (×4): qty 2

## 2015-02-18 MED ORDER — ONDANSETRON HCL 4 MG/2ML IJ SOLN: 4.0000 mg | Freq: Four times a day (QID) | INTRAMUSCULAR | Status: DC | PRN

## 2015-02-18 NOTE — ED Provider Notes (Signed)
CSN: [id number]     Arrival date & time 02/18/15  1159 History   First MD Initiated Contact with Patient 02/18/15 1205     Chief Complaint  Patient presents with  . Shortness of Breath  . Chest Pain     (Consider location/radiation/quality/duration/timing/severity/associated sxs/prior Treatment) Patient is a 69 y.o. female presenting with shortness of breath and chest pain. The history is provided by the patient.  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 Hypertension   . Hypercholesteremia   . Bladder cancer   . Skin cancer   . GERD (gastroesophageal reflux disease)   . Nummular eczema    Past Surgical History  Procedure Laterality Date  . Back surgery    . Replacement total knee bilateral    . Bladder tumor excision    . Lumpectomy right     Family History  Problem Relation Age of Onset  . Heart failure Mother   . Cancer Mother   . Hypertension Mother   . Hypertension Father    History  Substance Use Topics  . Smoking status: Never Smoker   . Smokeless tobacco: Not on file  . Alcohol Use: No   OB History    No data available     Review of Systems  Constitutional: Negative for fever.  Respiratory: Positive for cough, hemoptysis, sputum production and shortness of breath. Negative for wheezing.   Cardiovascular: Positive for chest pain.  All other systems reviewed and are negative.     Allergies  Clindamycin/lincomycin;  Erythromycin base; Keflex; Penicillins; Sulfa antibiotics; and Tetracyclines & related  Home Medications   Prior to Admission medications   Medication Sig Start Date End Date Taking? Authorizing Provider  aspirin 325 MG tablet Take 325 mg by mouth daily as needed. For chest pain   Yes Historical Provider, MD  diazepam (VALIUM) 5 MG tablet Take 5 mg by mouth every 6 (six) hours as needed for anxiety.  02/05/15  Yes Historical Provider, MD  divalproex (DEPAKOTE ER) 500 MG 24 hr tablet Take 1,000 mg by mouth at bedtime.    Yes Historical Provider, MD  FLUoxetine (PROZAC) 40 MG capsule Take 40 mg by mouth daily.   Yes Historical Provider, MD  simvastatin (ZOCOR) 80 MG tablet Take 80 mg by mouth at bedtime.   Yes Historical Provider, MD  trandolapril (MAVIK) 2 MG tablet Take 2 mg by mouth daily.   Yes Historical Provider, MD  triamcinolone cream (KENALOG) 0.1 % Apply 1 application topically as needed (eczema).   Yes Historical Provider, MD  valACYclovir (VALTREX) 1000 MG tablet Take 1 g by mouth 2 (two) times daily. 02/04/15  Yes Historical Provider, MD   BP 130/51 mmHg  Pulse 91  Temp(Src) 98.6 F (37 C) (Oral)  Resp 17  Ht 5\' 4"  (1.626 m)  Wt 209 lb 8 oz (95.029 kg)  BMI 35.94 kg/m2  SpO2 100% Physical Exam  Constitutional: She is oriented to person,  place, and time. She appears well-developed and well-nourished. No distress.  HENT:  Head: Normocephalic.  Eyes: Conjunctivae are normal.  Neck: Neck supple. No tracheal deviation present.  Cardiovascular: Regular rhythm and normal heart sounds.  Tachycardia present.   Pulmonary/Chest: Effort normal and breath sounds normal. No respiratory distress. She has no wheezes. She has no rales. She exhibits no tenderness.  Abdominal: Soft. She exhibits no distension. There is no tenderness.  Neurological: She is alert and oriented to person, place, and time.  Skin: Skin is warm and dry.  Psychiatric: She has a normal mood and affect.    ED Course   Procedures (including critical care time) Labs Review Labs Reviewed  CBC - Abnormal; Notable for the following:    WBC 11.1 (*)    Hemoglobin 8.8 (*)    HCT 30.4 (*)    MCV 72.0 (*)    MCH 20.9 (*)    MCHC 28.9 (*)    RDW 19.5 (*)    Platelets 409 (*)    All other components within normal limits  BASIC METABOLIC PANEL  BRAIN NATRIURETIC PEPTIDE  APTT  PROTIME-INR  I-STAT TROPOININ, ED  POC OCCULT BLOOD, ED    Imaging Review Ct Angio Chest Pe W/cm &/or Wo Cm  02/18/2015   CLINICAL DATA:  Chest pain and shortness of breath for 2 days  EXAM: CT ANGIOGRAPHY CHEST WITH CONTRAST  TECHNIQUE: Multidetector CT imaging of the chest was performed using the standard protocol during bolus administration of intravenous contrast. Multiplanar CT image reconstructions and MIPs were obtained to evaluate the vascular anatomy.  CONTRAST:  52mL OMNIPAQUE IOHEXOL 350 MG/ML SOLN  COMPARISON:  Chest radiograph April 15, 2012  FINDINGS: There is no demonstrable pulmonary embolus. There is no thoracic aortic aneurysm or dissection.  There is patchy atelectasis in inferior lingula as well as in the posterior segment of the right lower lobe. There is minimal scarring in the anterior right base. The lungs elsewhere are clear.  Thyroid appears unremarkable. There is no appreciable thoracic adenopathy. The pericardium is not appreciably thickened.  There are foci of coronary artery calcification.  In the visualized upper abdomen, there are small apparent cysts in the inferior liver. There is a cyst arising from the upper pole of the left kidney posteriorly measuring 1.8 x 1.3 cm. The visualized upper abdominal structures otherwise appear unremarkable.  There is degenerative change in the thoracic spine. There are no blastic or lytic bone lesions.  Review of the MIP images confirms the above findings.  IMPRESSION: No demonstrable pulmonary embolus. There is mild atelectasis in the inferior lingula and posterior right  base. No frank consolidation. No apparent adenopathy. There are several areas of coronary artery calcification, primarily in the left anterior descending coronary artery.   Electronically Signed   By: Lowella Grip III M.D.   On: 02/18/2015 15:00     EKG Interpretation   Date/Time:  Thursday February 18 2015 12:04:37 EDT Ventricular Rate:  111 PR Interval:  152 QRS Duration: 70 QT Interval:  354 QTC Calculation: 481 R Axis:   74 Text Interpretation:  Sinus tachycardia Otherwise normal ECG no  significant change since 2013 Confirmed by GOLDSTON  MD, SCOTT ([id number]) on  02/18/2015 12:34:43 PM      MDM   Final diagnoses:  Hemoptysis  Anemia, unspecified anemia type   69 year old feel presents from her primary care physician office concerned for chest pain with shortness of breath and cough that is been ongoing for the  last 2 days. She has a low-grade temperature arrival without fever, she is tachycardic on arrival. She has an S1 every 3 T3 pattern on her EKG and has had some small volume hemoptysis. 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 CT scan for PE was ordered for evaluation of thromboembolism.  Patient is high risk for ACS by heart score of 6 and is anemic compared to her last blood draw here. Unclear if tachycardia and symptoms related to anemia versus atypical ACS presentation so the hospitalist was consulted for evaluation of this patient in the emergency department for admission.    Leo Grosser, MD 02/18/15 [id number]  Sherwood Gambler, MD 02/20/15 [phone]

## 2015-02-18 NOTE — ED Notes (Signed)
Pt placed back on monitor, continuous pulse oximetry and blood pressure cuff

## 2015-02-18 NOTE — ED Notes (Signed)
Pt reports central chest pressure with SOB x 2 days progressively worsening. Worse with exertion and laying flat. Pt also reports NP cough x 2 days with subjective fever/chills. NAD.

## 2015-02-18 NOTE — Progress Notes (Signed)
Admitting MD discussed need for stress test with Dr. Burt Knack.  No consult unless + troponins.  We will plan to do nuc in AM.   He will need to be NPO after MN

## 2015-02-18 NOTE — H&P (Addendum)
[facility] History and Physical  Peggy [name] IRC:[id number] DOB: February 11, 1946 DOA: 02/18/2015  Referring physician:ED PCP: Irwin Brakeman, MD   Chief Complaint:  Chest pain at rest x 2 days  HPI:  69 year old female with history of hypertension, hyperlipidemia and chronic back pain presented to the ED with diffuse anterior chest wall pain for the past 2 days. Patient reports having pressure-like pain over the anterior chest occasionally involving the left shoulder lasting for a few minutes and subsiding on its own. She has pain 2-3 times a day without any aggravating or relieving factors. The pain is 8/10 in severity with associated shortness of breath. Denies lifting heavy weights or any chest trauma. Denies similar chest pain symptoms in the past. She has history of GERD works symptoms to be unrelated to this. Patient also reports shortness of breath for past several weeks worsened with exertion and occasional cough with hemoptysis. She denies any orthopnea, PND or palpitations. Denied any recent illness, sick contacts or recent travel. She has also noticed some leg swellings for an unclear duration. She also reports occasional bright red blood mixed with stool (last episode was 1 week back). Denies use of NSAIDs. Reports having a colonoscopy within the past 5 years (done by Dr. Collene Mares)  Patient denies headache, dizziness, fever, chills, nausea , vomiting,, abdominal pain,  urinary symptoms. Denies change in weight or appetite.  Course in the ED patient had low normal blood pressure on presentation. She was tachycardic and low 100. Remaining vitals were stable. Blood work done showed a specific 11.1, improvement of 8.8 (dropped from her baseline of around 11), normal platelets. Chemistry was unremarkable. Initial troponin was negative. EKG shows sinus tachycardia at 111 without ST-T changes. A CT angiogram of the chest was done given history of DVT and was negative for PE but did show  several areas of coronary calcifications.  Review of Systems:  As outlined in history of present illness, all was 12 point review of systems unremarkable.   Past Medical History  Diagnosis Date  . Hypertension   . Hypercholesteremia   . Bladder cancer   . Skin cancer   . GERD (gastroesophageal reflux disease)   . Nummular eczema    Past Surgical History  Procedure Laterality Date  . Back surgery    . Replacement total knee bilateral    . Bladder tumor excision    . Lumpectomy right     Social History:  reports that she has never smoked. She does not have any smokeless tobacco history on file. She reports that she does not drink alcohol or use illicit drugs. fairly independent at baseline and uses cane for ambulation.  Allergies  Allergen Reactions  . Clindamycin/Lincomycin     hives  . Erythromycin Base     itching  . Keflex [Cephalexin]     rash  . Penicillins     Swelling,rash  . Sulfa Antibiotics     rash  . Tetracyclines & Related     rash    Family History  Problem Relation Age of Onset  . Heart failure Mother   . Cancer Mother   . Hypertension Mother   . Hypertension Father     Prior to Admission medications   Medication Sig Start Date End Date Taking? Authorizing Provider  aspirin 325 MG tablet Take 325 mg by mouth daily as needed. For chest pain   Yes Historical Provider, MD  diazepam (VALIUM) 5 MG tablet Take 5 mg by mouth every 6 (  six) hours as needed for anxiety.  02/05/15  Yes Historical Provider, MD  divalproex (DEPAKOTE ER) 500 MG 24 hr tablet Take 1,000 mg by mouth at bedtime.    Yes Historical Provider, MD  FLUoxetine (PROZAC) 40 MG capsule Take 40 mg by mouth daily.   Yes Historical Provider, MD  simvastatin (ZOCOR) 80 MG tablet Take 80 mg by mouth at bedtime.   Yes Historical Provider, MD  trandolapril (MAVIK) 2 MG tablet Take 2 mg by mouth daily.   Yes Historical Provider, MD  triamcinolone cream (KENALOG) 0.1 % Apply 1 application topically as  needed (eczema).   Yes Historical Provider, MD  valACYclovir (VALTREX) 1000 MG tablet Take 1 g by mouth 2 (two) times daily. 02/04/15  Yes Historical Provider, MD     Physical Exam:  Filed Vitals:   02/18/15 1400 02/18/15 1559 02/18/15 1615 02/18/15 1630  BP: 130/51 169/63 143/66 146/74  Pulse: 91 105 98 102  Temp:      TempSrc:      Resp: 17 18 23 23   Height:      Weight:      SpO2: 100% 96% 99% 97%    Constitutional: Vital signs reviewed.  Elderly female in no acute distress HEENT: Pallor present, no icterus, moist oral mucosa, no cervical lymphadenopathy, no JVD Cardiovascular: RRR, S1 normal, S2 normal, no MRG, pain reproducible upon pressure over anterior chest. Chest: CTAB, no wheezes, rales, or rhonchi Abdominal: Soft. Non-tender, non-distended, bowel sounds are normal, no masses, organomegaly, Ext: warm, trace bilateral pitting edema Neurological: Alert and oriented, non focal  Labs on Admission:  Basic Metabolic Panel:  Recent Labs Lab 02/18/15 1218  NA 141  K 3.8  CL 107  CO2 26  GLUCOSE 90  BUN 6  CREATININE 0.83  CALCIUM 9.0   Liver Function Tests: No results for input(s): AST, ALT, ALKPHOS, BILITOT, PROT, ALBUMIN in the last 168 hours. No results for input(s): LIPASE, AMYLASE in the last 168 hours. No results for input(s): AMMONIA in the last 168 hours. CBC:  Recent Labs Lab 02/18/15 1218  WBC 11.1*  HGB 8.8*  HCT 30.4*  MCV 72.0*  PLT 409*   Cardiac Enzymes: No results for input(s): CKTOTAL, CKMB, CKMBINDEX, TROPONINI in the last 168 hours. BNP: Invalid input(s): POCBNP CBG: No results for input(s): GLUCAP in the last 168 hours.  Radiological Exams on Admission: Ct Angio Chest Pe W/cm &/or Wo Cm  02/18/2015   CLINICAL DATA:  Chest pain and shortness of breath for 2 days  EXAM: CT ANGIOGRAPHY CHEST WITH CONTRAST  TECHNIQUE: Multidetector CT imaging of the chest was performed using the standard protocol during bolus administration of  intravenous contrast. Multiplanar CT image reconstructions and MIPs were obtained to evaluate the vascular anatomy.  CONTRAST:  33mL OMNIPAQUE IOHEXOL 350 MG/ML SOLN  COMPARISON:  Chest radiograph April 15, 2012  FINDINGS: There is no demonstrable pulmonary embolus. There is no thoracic aortic aneurysm or dissection.  There is patchy atelectasis in inferior lingula as well as in the posterior segment of the right lower lobe. There is minimal scarring in the anterior right base. The lungs elsewhere are clear.  Thyroid appears unremarkable. There is no appreciable thoracic adenopathy. The pericardium is not appreciably thickened.  There are foci of coronary artery calcification.  In the visualized upper abdomen, there are small apparent cysts in the inferior liver. There is a cyst arising from the upper pole of the left kidney posteriorly measuring 1.8 x 1.3 cm. The  visualized upper abdominal structures otherwise appear unremarkable.  There is degenerative change in the thoracic spine. There are no blastic or lytic bone lesions.  Review of the MIP images confirms the above findings.  IMPRESSION: No demonstrable pulmonary embolus. There is mild atelectasis in the inferior lingula and posterior right base. No frank consolidation. No apparent adenopathy. There are several areas of coronary artery calcification, primarily in the left anterior descending coronary artery.   Electronically Signed   By: Lowella Grip III M.D.   On: 02/18/2015 15:00    EKG: sinus tachycardia @111 , No ST-T changes  Assessment/Plan   Principal Problem:   Chest pain at rest Admit to telemetry under observation Patient has both typical and atypical symptoms with some reproducible pain on exam. Her heart score is 4-5. She does have worsening anemia that could be contributing to her chest discomfort and shortness of breath. -Rule out ACS with serial cardiac enzymes. -Continue daily aspirin (on full dose aspirin at home). Place on  sublingual nitroglycerin when necessary for chest pain. Will add low dose beta blocker. Place on Lipitor (on 80 mg simvastatin daily at home). -Check 2-D echo to evaluate EF and rule out wall motion abnormality (patient has bilateral pitting edema as well) -If ruled out for ACS will plan on nuclear stress test for tomorrow. Patient has significant coronary calcification on chest CT.( Discussed with Dr Burt Knack  and he agreees with plan.) Cardiology consult as needed.  Active Problems:  Anemia No recent baseline in the system. Was 11-12 about 2 years back. Patient reports occasional bright red blood per rectum. Check stool for occult blood, iron panel, TSH and B12. Reports having colonoscopy (Dr. Collene Mares) in the past 5 years. Consult GI if workup abnormal has persistent anemia. -Monitor H&H in a.m. -Place on daily Protonix.    Hypertension Normal blood pressure on presentation. Currently stable after receiving one later normal saline bolus. Will place on low-dose beta blocker. Resume ARB.     Hyperlipidemia Continue statin  History of depression Resume home medication.     Diet:cardiac. Nothing by mouth after midnight.  DVT prophylaxis: SCD   Code Status: full code Family Communication: None at bedside Disposition Plan: home possibly in 24 hrs if ruled out for ACS and  tress test normal  Raeden Schippers, [place] [facility] Pager [phone]  Total time spent on admission :55 minutes  If 7PM-7AM, please contact night-coverage [email or web address] Password [facility] 02/18/2015, 5:21 PM

## 2015-02-19 ENCOUNTER — Observation Stay (HOSPITAL_COMMUNITY): Payer: Medicare Other

## 2015-02-19 DIAGNOSIS — K625 Hemorrhage of anus and rectum: Secondary | ICD-10-CM | POA: Diagnosis not present

## 2015-02-19 DIAGNOSIS — R0789 Other chest pain: Secondary | ICD-10-CM | POA: Diagnosis not present

## 2015-02-19 DIAGNOSIS — R079 Chest pain, unspecified: Secondary | ICD-10-CM | POA: Diagnosis not present

## 2015-02-19 DIAGNOSIS — R042 Hemoptysis: Secondary | ICD-10-CM | POA: Diagnosis not present

## 2015-02-19 DIAGNOSIS — D509 Iron deficiency anemia, unspecified: Secondary | ICD-10-CM | POA: Diagnosis not present

## 2015-02-19 DIAGNOSIS — R0602 Shortness of breath: Secondary | ICD-10-CM | POA: Diagnosis not present

## 2015-02-19 LAB — CBC
HCT: 26.7 % — ABNORMAL LOW (ref 36.0–46.0)
Hemoglobin: 7.8 g/dL — ABNORMAL LOW (ref 12.0–15.0)
MCH: 21 pg — ABNORMAL LOW (ref 26.0–34.0)
MCHC: 29.2 g/dL — ABNORMAL LOW (ref 30.0–36.0)
MCV: 71.8 fL — ABNORMAL LOW (ref 78.0–100.0)
Platelets: 331 10*3/uL (ref 150–400)
RBC: 3.72 MIL/uL — ABNORMAL LOW (ref 3.87–5.11)
RDW: 19.8 % — ABNORMAL HIGH (ref 11.5–15.5)
WBC: 9.6 10*3/uL (ref 4.0–10.5)

## 2015-02-19 LAB — TROPONIN I: Troponin I: <0.03 ng/mL (ref <0.031)

## 2015-02-19 LAB — PREPARE RBC (CROSSMATCH)

## 2015-02-19 MED ORDER — SODIUM CHLORIDE 0.9 % IV SOLN
Freq: Once | INTRAVENOUS | Status: AC
  Administered 2015-02-19: via INTRAVENOUS

## 2015-02-19 MED ORDER — REGADENOSON 0.4 MG/5ML IV SOLN
INTRAVENOUS | Status: AC
  Administered 2015-02-19: 0.4 mg via INTRAVENOUS
  Filled 2015-02-19: qty 5

## 2015-02-19 MED ORDER — TECHNETIUM TC 99M SESTAMIBI GENERIC - CARDIOLITE
30.0000 | Freq: Once | INTRAVENOUS | Status: AC | PRN
  Administered 2015-02-19: 30 via INTRAVENOUS

## 2015-02-19 MED ORDER — DIPHENHYDRAMINE HCL 25 MG PO CAPS
25.0000 mg | ORAL_CAPSULE | Freq: Once | ORAL | Status: DC
  Filled 2015-02-19: qty 1

## 2015-02-19 MED ORDER — REGADENOSON 0.4 MG/5ML IV SOLN
0.4000 mg | Freq: Once | INTRAVENOUS | Status: AC
  Administered 2015-02-19: 0.4 mg via INTRAVENOUS
  Filled 2015-02-19: qty 5

## 2015-02-19 MED ORDER — TECHNETIUM TC 99M SESTAMIBI GENERIC - CARDIOLITE
10.0000 | Freq: Once | INTRAVENOUS | Status: AC | PRN
  Administered 2015-02-19: 10 via INTRAVENOUS

## 2015-02-19 MED ORDER — PANTOPRAZOLE SODIUM 40 MG IV SOLR
40.0000 mg | Freq: Two times a day (BID) | INTRAVENOUS | Status: DC
  Filled 2015-02-19: qty 40

## 2015-02-19 MED ORDER — DIPHENHYDRAMINE HCL 50 MG/ML IJ SOLN: 12.5000 mg | Freq: Three times a day (TID) | INTRAMUSCULAR | Status: DC | PRN

## 2015-02-19 NOTE — Progress Notes (Signed)
Nutrition Brief Note  Patient identified on the Malnutrition Screening Tool (MST) Report. Pt states her usual wt is 208 - 215 Lb, appetite has been good, admits to eating "more that I should". No recent wt loss. Pt received breakfast tray at time of visit, eating while talking to RD.   Wt Readings from Last 15 Encounters:  02/19/15 210 lb 3.2 oz (95.346 kg)    Body mass index is 36.06 kg/(m^2). Patient meets criteria for obese based on current BMI.   Current diet order is Heart Healthy. Labs and medications reviewed.    No nutrition interventions warranted at this time. If nutrition issues arise, please consult RD.   Zayon Trulson A. [facility] Dietetic Intern Pager: [phone] 02/19/2015 11:15 AM

## 2015-02-19 NOTE — Consult Note (Signed)
Reason for Consult: Iron deficiency anemia. Referring Physician: THP  [name] is an 69 y.o. female.  HPI: 69 year old black female admitted with a 3 day history of chest pain and a cough found to have anemia with a hemoglobin of 7.8 gm/dl on admission. She gives a history of rectal bleeding for the last 1 month. She has a longstanding history of constipation. She has been on PPi for reflux and had esophageal dysmotility on a barium swallow in 2011. At times, she has had lower abdominal pain with constipation but denies having any other GI issues. Her last colonoscopy was done in May 2011, when she was found to have a few scattered sigmoid diverticulosis and a non-bleeding cecal AVM. There was a lot of residual stool in the colon. She takes an Aspirin everyday but denies any NSAID use or abuse.   Past Medical History  Diagnosis Date  . Hypertension   . Hypercholesteremia   . GERD (gastroesophageal reflux disease)   . Nummular eczema   . Kidney stones   . Bladder cancer   . Skin cancer     "right abdomen"  . DVT (deep venous thrombosis) 2009/2010    "probably left leg"   . Chronic bronchitis     'get it q yr"  . Daily headache   . Arthritis     "all over"  . Chronic lower back pain   . Anxiety   . Depression   . Deafness in left ear    Past Surgical History  Procedure Laterality Date  . Back surgery    . Replacement total knee bilateral Bilateral   . Bladder tumor excision    . Lumbar disc surgery  1980's X 1; ~ 2002  . Joint replacement    . Appendectomy Right   . Breast biopsy Right   . Breast lumpectomy    . Knee arthroscopy Right ~ 2000  . Medial partial knee replacement Right 2001  . Partial knee arthroplasty Left 2009  . Revision total knee arthroplasty Left   . Abdominal hysterectomy  1980's  . Skin cancer excision Right     "abdomen"   Family History  Problem Relation Age of Onset  . Heart failure Mother   . Cancer Mother   . Hypertension Mother   .  Hypertension Father    Social History:  reports that she has never smoked. She has never used smokeless tobacco. She reports that she does not drink alcohol or use illicit drugs.  Allergies:  Allergies  Allergen Reactions  . Clindamycin/Lincomycin     hives  . Erythromycin Base     itching  . Keflex [Cephalexin]     rash  . Penicillins     Swelling,rash  . Sulfa Antibiotics     rash  . Tetracyclines & Related     rash   Medications: I have reviewed the patient's current medications.  Results for orders placed or performed during the hospital encounter of 02/18/15 (from the past 48 hour(s))  CBC     Status: Abnormal   Collection Time: 02/18/15 12:18 PM  Result Value Ref Range   WBC 11.1 (H) 4.0 - 10.5 K/uL   RBC 4.22 3.87 - 5.11 MIL/uL   Hemoglobin 8.8 (L) 12.0 - 15.0 g/dL   HCT 30.4 (L) 36.0 - 46.0 %   MCV 72.0 (L) 78.0 - 100.0 fL   MCH 20.9 (L) 26.0 - 34.0 pg   MCHC 28.9 (L) 30.0 - 36.0 g/dL  RDW 19.5 (H) 11.5 - 15.5 %   Platelets 409 (H) 150 - 400 K/uL  Basic metabolic panel     Status: None   Collection Time: 02/18/15 12:18 PM  Result Value Ref Range   Sodium 141 135 - 145 mmol/L   Potassium 3.8 3.5 - 5.1 mmol/L   Chloride 107 101 - 111 mmol/L   CO2 26 22 - 32 mmol/L   Glucose, Bld 90 65 - 99 mg/dL   BUN 6 6 - 20 mg/dL   Creatinine, Ser 0.83 0.44 - 1.00 mg/dL   Calcium 9.0 8.9 - 10.3 mg/dL   GFR calc non Af Amer >60 >60 mL/min   GFR calc Af Amer >60 >60 mL/min    Comment: (NOTE) The eGFR has been calculated using the CKD EPI equation. This calculation has not been validated in all clinical situations. eGFR's persistently <60 mL/min signify possible Chronic Kidney Disease.    Anion gap 8 5 - 15  BNP (order ONLY if patient complains of dyspnea/SOB AND you have documented it for THIS visit)     Status: None   Collection Time: 02/18/15 12:18 PM  Result Value Ref Range   B Natriuretic Peptide 54.0 0.0 - 100.0 pg/mL  I-stat troponin, ED  (not at [facility], [facility])      Status: None   Collection Time: 02/18/15 12:23 PM  Result Value Ref Range   Troponin i, poc 0.00 0.00 - 0.08 ng/mL   Comment 3            Comment: Due to the release kinetics of cTnI, a negative result within the first hours of the onset of symptoms does not rule out myocardial infarction with certainty. If myocardial infarction is still suspected, repeat the test at appropriate intervals.   APTT     Status: None   Collection Time: 02/18/15 12:30 PM  Result Value Ref Range   aPTT 27 24 - 37 seconds  Protime-INR     Status: None   Collection Time: 02/18/15 12:30 PM  Result Value Ref Range   Prothrombin Time 14.0 11.6 - 15.2 seconds   INR 1.06 0.00 - 1.49  POC occult blood, ED RN will collect     Status: None   Collection Time: 02/18/15  3:35 PM  Result Value Ref Range   Fecal Occult Bld NEGATIVE NEGATIVE  Iron and TIBC     Status: Abnormal   Collection Time: 02/18/15  7:00 PM  Result Value Ref Range   Iron 19 (L) 28 - 170 ug/dL   TIBC 545 (H) 250 - 450 ug/dL   Saturation Ratios 3 (L) 10.4 - 31.8 %   UIBC 526 ug/dL  TSH     Status: None   Collection Time: 02/18/15  7:00 PM  Result Value Ref Range   TSH 1.256 0.350 - 4.500 uIU/mL  Vitamin B12     Status: None   Collection Time: 02/18/15  7:00 PM  Result Value Ref Range   Vitamin B-12 337 180 - 914 pg/mL    Comment: (NOTE) This assay is not validated for testing neonatal or myeloproliferative syndrome specimens for Vitamin B12 levels.   CBC     Status: Abnormal   Collection Time: 02/18/15  7:00 PM  Result Value Ref Range   WBC 11.0 (H) 4.0 - 10.5 K/uL   RBC 4.22 3.87 - 5.11 MIL/uL   Hemoglobin 8.8 (L) 12.0 - 15.0 g/dL   HCT 30.8 (L) 36.0 - 46.0 %   MCV  73.0 (L) 78.0 - 100.0 fL   MCH 20.9 (L) 26.0 - 34.0 pg   MCHC 28.6 (L) 30.0 - 36.0 g/dL   RDW 19.6 (H) 11.5 - 15.5 %   Platelets 421 (H) 150 - 400 K/uL  Troponin I     Status: None   Collection Time: 02/18/15  9:55 PM  Result Value Ref Range   Troponin I <0.03  <0.031 ng/mL    Comment:        NO INDICATION OF MYOCARDIAL INJURY.   Troponin I     Status: None   Collection Time: 02/19/15  2:22 AM  Result Value Ref Range   Troponin I <0.03 <0.031 ng/mL    Comment:        NO INDICATION OF MYOCARDIAL INJURY.   Troponin I (q 6hr x 3)     Status: None   Collection Time: 02/19/15  5:35 AM  Result Value Ref Range   Troponin I <0.03 <0.031 ng/mL    Comment:        NO INDICATION OF MYOCARDIAL INJURY.   CBC     Status: Abnormal   Collection Time: 02/19/15  5:35 AM  Result Value Ref Range   WBC 9.6 4.0 - 10.5 K/uL   RBC 3.72 (L) 3.87 - 5.11 MIL/uL   Hemoglobin 7.8 (L) 12.0 - 15.0 g/dL   HCT 26.7 (L) 36.0 - 46.0 %   MCV 71.8 (L) 78.0 - 100.0 fL   MCH 21.0 (L) 26.0 - 34.0 pg   MCHC 29.2 (L) 30.0 - 36.0 g/dL   RDW 19.8 (H) 11.5 - 15.5 %   Platelets 331 150 - 400 K/uL    Comment: REPEATED TO VERIFY PLATELET COUNT CONFIRMED BY SMEAR    Ct Angio Chest Pe W/cm &/or Wo Cm  02/18/2015   CLINICAL DATA:  Chest pain and shortness of breath for 2 days  EXAM: CT ANGIOGRAPHY CHEST WITH CONTRAST  TECHNIQUE: Multidetector CT imaging of the chest was performed using the standard protocol during bolus administration of intravenous contrast. Multiplanar CT image reconstructions and MIPs were obtained to evaluate the vascular anatomy.  CONTRAST:  70m OMNIPAQUE IOHEXOL 350 MG/ML SOLN  COMPARISON:  Chest radiograph April 15, 2012  FINDINGS: There is no demonstrable pulmonary embolus. There is no thoracic aortic aneurysm or dissection.  There is patchy atelectasis in inferior lingula as well as in the posterior segment of the right lower lobe. There is minimal scarring in the anterior right base. The lungs elsewhere are clear.  Thyroid appears unremarkable. There is no appreciable thoracic adenopathy. The pericardium is not appreciably thickened.  There are foci of coronary artery calcification.  In the visualized upper abdomen, there are small apparent cysts in the  inferior liver. There is a cyst arising from the upper pole of the left kidney posteriorly measuring 1.8 x 1.3 cm. The visualized upper abdominal structures otherwise appear unremarkable.  There is degenerative change in the thoracic spine. There are no blastic or lytic bone lesions.  Review of the MIP images confirms the above findings.  IMPRESSION: No demonstrable pulmonary embolus. There is mild atelectasis in the inferior lingula and posterior right base. No frank consolidation. No apparent adenopathy. There are several areas of coronary artery calcification, primarily in the left anterior descending coronary artery.   Electronically Signed   By: WLowella GripIII M.D.   On: 02/18/2015 15:00    Review of Systems  Constitutional: Positive for malaise/fatigue. Negative for fever and chills.  HENT:  Negative.   Eyes: Negative.   Respiratory: Positive for shortness of breath.   Cardiovascular: Positive for chest pain.  Gastrointestinal: Positive for heartburn, abdominal pain, constipation and blood in stool. Negative for nausea, vomiting and diarrhea.  Musculoskeletal: Positive for back pain.  Skin: Negative.   Neurological: Negative.   Endo/Heme/Allergies: Negative.   Psychiatric/Behavioral: Negative.    Blood pressure 154/66, pulse 95, temperature 99.1 F (37.3 C), temperature source Oral, resp. rate 18, height _0  (1.626 m), weight 95.346 kg (210 lb 3.2 oz), SpO2 97 %. Physical Exam  Constitutional: She is oriented to person, place, and time. She appears well-developed and well-nourished.  HENT:  Head: Normocephalic and atraumatic.  Eyes: Conjunctivae and EOM are normal. Pupils are equal, round, and reactive to light.  Neck: Normal range of motion. Neck supple.  Cardiovascular: Normal rate and regular rhythm.   Respiratory: Effort normal and breath sounds normal.  GI: Soft. Bowel sounds are normal.  Musculoskeletal: Normal range of motion.  Neurological: She is alert and oriented  to person, place, and time.  Skin: Skin is warm and dry.  Psychiatric: She has a normal mood and affect. Her behavior is normal. Judgment and thought content normal.   Assessment/Plan: 1) Iron deficiency anemia: will plan an EGD tomorrow. Patient wants to wait and have her colonoscopy on an OP basis after her cardiac workup has been completed.  2) Rectal bleeding x 1 month with occasional constipation.  3) GERD : On PPI. 4) Sigmoid diverticulosis.  5) Cecal AVM.  6) Chest pain-being worked up.  7) Hypertension/Hyperlipidemia.  8) Back pain/spinal stenosis/DDD.  Peggy [name] 02/19/2015, 9:05 AM

## 2015-02-19 NOTE — Progress Notes (Addendum)
[facility] PROGRESS NOTE  Peggy [name] XKG:[id number] DOB: 1946/01/26 DOA: 02/18/2015 PCP: Maximino Greenland, MD     Assessment/Plan: Principal Problem:  Chest pain at rest Serial cardiac enzymes negative. repeat EKG normal. Stable on telemetry. Coronary calcifications noted on CT chest done on admission. Stress and 2D echo results pending. -Continue daily aspirin   sublingual nitroglycerin when necessary for chest pain. added low dose beta blocker. Placed on Lipitor (on 80 mg simvastatin daily at home).   Active Problems:  Anemia No recent baseline in the system. Was 11-12 about 2 years back. Patient reports occasional bright red blood per rectum. Check stool for occult blood negative. , iron panel suggests iron deficiency. , TSH and B12 normal. . Last hb checked at PCP office was 8.7 in September 2015. Reports having colonoscopy (Dr. Collene Mares) in the past 5 years. Consulted GI . Plan on EGD tomorrow ( as per pt.) will follow up with recommendations. -Placed on BID IV Protonix.   Hypertension Added  low-dose beta blocker. Resume ARB.    Hyperlipidemia Continue statin  History of depression Resumed home medication.  Code Status: full code Family Communication: none at bedside Disposition Plan: EGD planned in am. pending stress test results. Possibly home tomorrow if w/up completed.   Consultants:  GI ( Dr Collene Mares)  Procedures:  Echo  stress test  Antibiotics:  none  HPI/Subjective: Seen and examined after returning from stress. Denies further chest pain. No hematochezia. Reports occasional SOB on exertion.  Objective: Filed Vitals:   02/19/15 0859  BP: 154/66  Pulse:   Temp:   Resp:     Intake/Output Summary (Last 24 hours) at 02/19/15 1301 Last data filed at 02/19/15 0800  Gross per 24 hour  Intake    480 ml  Output    700 ml  Net   -220 ml   Filed Weights   02/18/15 1226 02/18/15 1751 02/19/15 0400  Weight: 95.029 kg (209 lb 8 oz) 95.301 kg  (210 lb 1.6 oz) 95.346 kg (210 lb 3.2 oz)    Exam:   General:  elderly female in NAD   HEENT: pallor+, moist mucosa  Chest: clear b/l, no added sounds  CVS: NS1&S2, no murmurs  GI: soft, NT, ND, BS+  Musculoskeletal: warm, trace edema  CNS: alert and oriented.    Data Reviewed: Basic Metabolic Panel:  Recent Labs Lab 02/18/15 1218  NA 141  K 3.8  CL 107  CO2 26  GLUCOSE 90  BUN 6  CREATININE 0.83  CALCIUM 9.0   Liver Function Tests: No results for input(s): AST, ALT, ALKPHOS, BILITOT, PROT, ALBUMIN in the last 168 hours. No results for input(s): LIPASE, AMYLASE in the last 168 hours. No results for input(s): AMMONIA in the last 168 hours. CBC:  Recent Labs Lab 02/18/15 1218 02/18/15 1900 02/19/15 0535  WBC 11.1* 11.0* 9.6  HGB 8.8* 8.8* 7.8*  HCT 30.4* 30.8* 26.7*  MCV 72.0* 73.0* 71.8*  PLT 409* 421* 331   Cardiac Enzymes:  Recent Labs Lab 02/18/15 2155 02/19/15 0222 02/19/15 0535  TROPONINI <0.03 <0.03 <0.03   BNP (last 3 results)  Recent Labs  02/18/15 1218  BNP 54.0    ProBNP (last 3 results) No results for input(s): PROBNP in the last 8760 hours.  CBG: No results for input(s): GLUCAP in the last 168 hours.  No results found for this or any previous visit (from the past 240 hour(s)).   Studies: Ct Angio Chest Pe W/cm &/or Wo Cm  02/18/2015   CLINICAL DATA:  Chest pain and shortness of breath for 2 days  EXAM: CT ANGIOGRAPHY CHEST WITH CONTRAST  TECHNIQUE: Multidetector CT imaging of the chest was performed using the standard protocol during bolus administration of intravenous contrast. Multiplanar CT image reconstructions and MIPs were obtained to evaluate the vascular anatomy.  CONTRAST:  74mL OMNIPAQUE IOHEXOL 350 MG/ML SOLN  COMPARISON:  Chest radiograph April 15, 2012  FINDINGS: There is no demonstrable pulmonary embolus. There is no thoracic aortic aneurysm or dissection.  There is patchy atelectasis in inferior lingula as  well as in the posterior segment of the right lower lobe. There is minimal scarring in the anterior right base. The lungs elsewhere are clear.  Thyroid appears unremarkable. There is no appreciable thoracic adenopathy. The pericardium is not appreciably thickened.  There are foci of coronary artery calcification.  In the visualized upper abdomen, there are small apparent cysts in the inferior liver. There is a cyst arising from the upper pole of the left kidney posteriorly measuring 1.8 x 1.3 cm. The visualized upper abdominal structures otherwise appear unremarkable.  There is degenerative change in the thoracic spine. There are no blastic or lytic bone lesions.  Review of the MIP images confirms the above findings.  IMPRESSION: No demonstrable pulmonary embolus. There is mild atelectasis in the inferior lingula and posterior right base. No frank consolidation. No apparent adenopathy. There are several areas of coronary artery calcification, primarily in the left anterior descending coronary artery.   Electronically Signed   By: Lowella Grip III M.D.   On: 02/18/2015 15:00    Scheduled Meds: . aspirin EC  325 mg Oral Daily  . atorvastatin  40 mg Oral q1800  . divalproex  1,000 mg Oral QHS  . feeding supplement (ENSURE ENLIVE)  237 mL Oral BID BM  . FLUoxetine  40 mg Oral Daily  . metoprolol tartrate  12.5 mg Oral BID  . pantoprazole (PROTONIX) IV  40 mg Intravenous Q12H  . trandolapril  2 mg Oral Daily  . valACYclovir  1,000 mg Oral BID   Continuous Infusions:    Time spent: 25 minutes    Louellen Molder  [facility] Pager [phone] If 7PM-7AM, please contact night-coverage at [email or web address], password [facility] 02/19/2015, 1:01 PM

## 2015-02-19 NOTE — Progress Notes (Signed)
  Echocardiogram 2D Echocardiogram has been performed.  Peggy [name] 02/19/2015, 12:55 PM

## 2015-02-20 ENCOUNTER — Encounter (HOSPITAL_COMMUNITY): Payer: Self-pay | Admitting: *Deleted

## 2015-02-20 ENCOUNTER — Encounter (HOSPITAL_COMMUNITY)
Admission: EM | Disposition: A | Discharge status: Still In Hospital | Payer: Medicare Other | Source: Home / Self Care | Attending: Emergency Medicine

## 2015-02-20 DIAGNOSIS — R0789 Other chest pain: Secondary | ICD-10-CM | POA: Diagnosis not present

## 2015-02-20 DIAGNOSIS — R079 Chest pain, unspecified: Secondary | ICD-10-CM

## 2015-02-20 DIAGNOSIS — D509 Iron deficiency anemia, unspecified: Secondary | ICD-10-CM | POA: Diagnosis present

## 2015-02-20 HISTORY — PX: ESOPHAGOGASTRODUODENOSCOPY: SHX5428

## 2015-02-20 SURGERY — EGD (ESOPHAGOGASTRODUODENOSCOPY)
Anesthesia: Moderate Sedation | Laterality: Left

## 2015-02-20 MED ORDER — MIDAZOLAM HCL 10 MG/2ML IJ SOLN
INTRAMUSCULAR | Status: DC | PRN
  Administered 2015-02-20 (×2): 2 mg via INTRAVENOUS

## 2015-02-20 MED ORDER — DIPHENHYDRAMINE HCL 50 MG/ML IJ SOLN
INTRAMUSCULAR | Status: AC
  Filled 2015-02-20: qty 1

## 2015-02-20 MED ORDER — SODIUM CHLORIDE 0.9 % IV SOLN: INTRAVENOUS | Status: DC

## 2015-02-20 MED ORDER — BUTAMBEN-TETRACAINE-BENZOCAINE 2-2-14 % EX AERO
INHALATION_SPRAY | CUTANEOUS | Status: DC | PRN
  Administered 2015-02-20: 2 via TOPICAL

## 2015-02-20 MED ORDER — MIDAZOLAM HCL 5 MG/ML IJ SOLN
INTRAMUSCULAR | Status: AC
  Filled 2015-02-20: qty 2

## 2015-02-20 MED ORDER — FERROUS SULFATE 325 (65 FE) MG PO TABS: 325.0000 mg | ORAL_TABLET | Freq: Three times a day (TID) | ORAL | Status: DC

## 2015-02-20 MED ORDER — FENTANYL CITRATE (PF) 100 MCG/2ML IJ SOLN
INTRAMUSCULAR | Status: AC
  Filled 2015-02-20: qty 2

## 2015-02-20 MED ORDER — ENSURE ENLIVE PO LIQD: 237.0000 mL | Freq: Two times a day (BID) | ORAL | Status: DC

## 2015-02-20 MED ORDER — FENTANYL CITRATE (PF) 100 MCG/2ML IJ SOLN
INTRAMUSCULAR | Status: DC | PRN
  Administered 2015-02-20: 25 ug via INTRAVENOUS

## 2015-02-20 MED ORDER — PANTOPRAZOLE SODIUM 40 MG PO TBEC: 40.0000 mg | DELAYED_RELEASE_TABLET | Freq: Every day | ORAL | Status: DC

## 2015-02-20 NOTE — Op Note (Signed)
[facility] [place], [id number]   ENDOSCOPY PROCEDURE REPORT  PATIENT: Peggy [name], Peggy [name]  MR#: [id number] BIRTHDATE: [birth year] , 69  yrs. old GENDER: female ENDOSCOPIST: Milus Banister, MD REFERRED BY:  Juanita Craver, M.D. PROCEDURE DATE:  02/20/2015 PROCEDURE:  EGD, diagnostic ASA CLASS:     Class II INDICATIONS:  chest pain, anemia. MEDICATIONS: Fentanyl 25 mcg IV and Versed 4 mg IV TOPICAL ANESTHETIC: none  DESCRIPTION OF PROCEDURE: After the risks benefits and alternatives of the procedure were thoroughly explained, informed consent was obtained.  The Pentax Gastroscope [id number] endoscope was introduced through the mouth and advanced to the second portion of the duodenum , Without limitations.  The instrument was slowly withdrawn as the mucosa was fully examined.   EXAM: The esophagus and gastroesophageal junction were completely normal in appearance.  The stomach was entered and closely examined.The antrum, angularis, and lesser curvature were well visualized, including a retroflexed view of the cardia and fundus. The stomach wall was normally distensable.  The scope passed easily through the pylorus into the duodenum.  Retroflexed views revealed no abnormalities.     The scope was then withdrawn from the patient and the procedure completed. COMPLICATIONS: There were no immediate complications.  ENDOSCOPIC IMPRESSION: Normal appearing esophagus and GE junction, the stomach was well visualized and normal in appearance, normal appearing duodenum  RECOMMENDATIONS: OK to discharge today.  She should contact Dr.  Lorie Apley office about timing of her upcoming outpatient colonoscopy.  eSigned:  Milus Banister, MD 02/20/2015 10:55 AM

## 2015-02-20 NOTE — Discharge Summary (Signed)
Physician Discharge Summary  CRESCENT GOTHAM PFX:[id number] DOB: Aug 18, 1946 DOA: 02/18/2015  PCP: Maximino Greenland, MD  Admit date: 02/18/2015 Discharge date: 02/20/2015  Time spent: <30 minutes  Recommendations for Outpatient Follow-up:  1. Discharge home with outpatient follow-up with PCP in 1-2 weeks. Please recheck hemoglobin during outpatient visit 2. Follow-up with GI Dr. Collene Mares in 2 weeks for colonoscopy  Discharge Diagnoses:  Principal Problem:   Musculoskeletal chest pain  Active Problems:   Hypertension   Chest pain at rest   Hyperlipidemia   Anemia, iron deficiency   Discharge Condition: Fair  Diet recommendation: Low sodium  Filed Weights   02/18/15 1751 02/19/15 0400 02/20/15 0515  Weight: 95.301 kg (210 lb 1.6 oz) 95.346 kg (210 lb 3.2 oz) 95.482 kg (210 lb 8 oz)    History of present illness:  Refer to admission H&P for details, 69 year old female with history of hypertension, hyperlipidemia and chronic back pain presented to the ED with diffuse anterior chest wall pain for the past 2 days. Patient reports having pressure-like pain over the anterior chest occasionally involving the left shoulder lasting for a few minutes and subsiding on its own. She has pain 2-3 times a day without any aggravating or relieving factors. The pain is 8/10 in severity with associated shortness of breath. Denies lifting heavy weights or any chest trauma. Denies similar chest pain symptoms in the past. She has history of GERD works symptoms to be unrelated to this. Patient also reports shortness of breath for past several weeks worsened with exertion and occasional cough with hemoptysis. She denies any orthopnea, PND or palpitations. Denied any recent illness, sick contacts or recent travel. She has also noticed some leg swellings for an unclear duration. She also reports occasional bright red blood mixed with stool (last episode was 1 week back). Denies use of NSAIDs. Reported having a  colonoscopy within the past 5 years (done by Dr. Collene Mares)    Course in the ED  patient had low normal blood pressure on presentation. She was tachycardic and low 100. Remaining vitals were stable. Blood work done showed a specific 11.1, improvement of 8.8 (dropped from her baseline of around 11), normal platelets. Chemistry was unremarkable. Initial troponin was negative. EKG shows sinus tachycardia at 111 without ST-T changes. A CT angiogram of the chest was done given history of DVT and was negative for PE but did show several areas of coronary calcifications. Patient admitted to hospital service on telemetry  Hospital Course:  Chest pain at rest  musculoskeletal pain vs associated exertional  symptoms with underlying anemia.  Serial cardiac enzymes negative.  Stable on telemetry. Coronary calcifications noted on CT chest done on admission 2D echo with normal EF no wall motion abnormality. Lexiscan Myoview negative for ischemia.  -Continued aspirin and statin. Symptoms have now resolved.    Active Problems:  Anemia -  Last hb checked at PCP office was 8.7 in September 2015. -Stool  for occult blood negative.  , iron panel suggests iron deficiency. , TSH and B12 normal. had colonoscopy (Dr. Collene Mares) in the past 5 years. Consulted GI EGD done today which was unremarkable. Patient wishes to have colonoscopy done as outpatient . She was given 2 units PRBC this morning. Will check repeat H&H and discharge her home. She should follow-up with Dr. Collene Mares to schedule colonoscopy.   Hypertension stable.  Resume ARB.    Hyperlipidemia Continue statin  History of depression Resumed home medication.  Code Status: full code  Family Communication:  none at bedside  Disposition :Home    Procedures:  2 D echo  Nuclear stress test  EGD  Consultations:  GI (Dr. Collene Mares)  Discharge Exam: Filed Vitals:   02/20/15 1100  BP: 153/63  Pulse: 88  Temp:   Resp: 19    General: Elderly female  in no acute distress HEENT: Pallor present, moist oral mucosa, supple neck  chest: Clear to auscultation bilaterally, no added sounds CVS: Normal S1 and S2, no murmurs rub or gallop  GI: Soft, nondistended, nontender, bowel sounds present Musculoskeletal: Warm, no edema CNS: Alert and oriented   Discharge Instructions    Current Discharge Medication List    START taking these medications   Details  feeding supplement, ENSURE ENLIVE, (ENSURE ENLIVE) LIQD Take 237 mLs by mouth 2 (two) times daily between meals. Qty: 237 mL, Refills: 12    ferrous sulfate 325 (65 FE) MG tablet Take 1 tablet (325 mg total) by mouth 3 (three) times daily with meals. Qty: 90 tablet, Refills: 0      CONTINUE these medications which have NOT CHANGED   Details  aspirin 325 MG tablet Take 325 mg by mouth daily as needed. For chest pain    diazepam (VALIUM) 5 MG tablet Take 5 mg by mouth every 6 (six) hours as needed for anxiety.  Refills: 0    divalproex (DEPAKOTE ER) 500 MG 24 hr tablet Take 1,000 mg by mouth at bedtime.     FLUoxetine (PROZAC) 40 MG capsule Take 40 mg by mouth daily.    simvastatin (ZOCOR) 80 MG tablet Take 80 mg by mouth at bedtime.    trandolapril (MAVIK) 2 MG tablet Take 2 mg by mouth daily.    triamcinolone cream (KENALOG) 0.1 % Apply 1 application topically as needed (eczema).    valACYclovir (VALTREX) 1000 MG tablet Take 1 g by mouth 2 (two) times daily. Refills: 0       Allergies  Allergen Reactions  . Clindamycin/Lincomycin     hives  . Erythromycin Base     itching  . Keflex [Cephalexin]     rash  . Penicillins     Swelling,rash  . Sulfa Antibiotics     rash  . Tetracyclines & Related     rash   Follow-up Information    Follow up with Maximino Greenland, MD. Schedule an appointment as soon as possible for a visit in 1 week.   Specialty:  Internal Medicine   Contact information:   [address] [place] [address] [phone]        Follow up with MANN,JYOTHI, MD. Schedule an appointment as soon as possible for a visit in 2 weeks.   Specialty:  Gastroenterology   Contact information:   [address] [place] [address] [phone]        The results of significant diagnostics from this hospitalization (including imaging, microbiology, ancillary and laboratory) are listed below for reference.    Significant Diagnostic Studies: Ct Angio Chest Pe W/cm &/or Wo Cm  02/18/2015   CLINICAL DATA:  Chest pain and shortness of breath for 2 days  EXAM: CT ANGIOGRAPHY CHEST WITH CONTRAST  TECHNIQUE: Multidetector CT imaging of the chest was performed using the standard protocol during bolus administration of intravenous contrast. Multiplanar CT image reconstructions and MIPs were obtained to evaluate the vascular anatomy.  CONTRAST:  11mL OMNIPAQUE IOHEXOL 350 MG/ML SOLN  COMPARISON:  Chest radiograph April 15, 2012  FINDINGS: There is no  demonstrable pulmonary embolus. There is no thoracic aortic aneurysm or dissection.  There is patchy atelectasis in inferior lingula as well as in the posterior segment of the right lower lobe. There is minimal scarring in the anterior right base. The lungs elsewhere are clear.  Thyroid appears unremarkable. There is no appreciable thoracic adenopathy. The pericardium is not appreciably thickened.  There are foci of coronary artery calcification.  In the visualized upper abdomen, there are small apparent cysts in the inferior liver. There is a cyst arising from the upper pole of the left kidney posteriorly measuring 1.8 x 1.3 cm. The visualized upper abdominal structures otherwise appear unremarkable.  There is degenerative change in the thoracic spine. There are no blastic or lytic bone lesions.  Review of the MIP images confirms the above findings.  IMPRESSION: No demonstrable pulmonary embolus. There is mild atelectasis in the inferior lingula and posterior right base. No frank  consolidation. No apparent adenopathy. There are several areas of coronary artery calcification, primarily in the left anterior descending coronary artery.   Electronically Signed   By: Lowella Grip III M.D.   On: 02/18/2015 15:00   Nm Myocar Multi W/spect W/wall Motion / Ef  02/19/2015   CLINICAL DATA:  Chest pain.  Hypertension.  EXAM: MYOCARDIAL IMAGING WITH SPECT (REST AND PHARMACOLOGIC-STRESS)  GATED LEFT VENTRICULAR WALL MOTION STUDY  LEFT VENTRICULAR EJECTION FRACTION  TECHNIQUE: Standard myocardial SPECT imaging was performed after resting intravenous injection of 10 mCi Tc-55m sestamibi. Subsequently, intravenous infusion of Lexiscan was performed under the supervision of the Cardiology staff. At peak effect of the drug, 30 mCi Tc-63m sestamibi was injected intravenously and standard myocardial SPECT imaging was performed. Quantitative gated imaging was also performed to evaluate left ventricular wall motion, and estimate left ventricular ejection fraction.  COMPARISON:  None.  FINDINGS: Perfusion: Mild severity medium sized defect the cardiac apex and anterior septal wall near the apex noted on stress and rest images. No inducible ischemia.  Wall Motion: Normal left ventricular wall motion. No left ventricular dilation.  Left Ventricular Ejection Fraction: 65 %  End diastolic volume 95 ml  End systolic volume 33 ml  IMPRESSION: 1. No reversible ischemia. Suspected scar along the cardiac apex and anteroseptal wall.  2. Normal left ventricular wall motion.  3. Left ventricular ejection fraction 65%  4. Low-risk stress test findings*.  *2012 Appropriate Use Criteria for Coronary Revascularization Focused Update: J Am Coll Cardiol. 1610;96(0):454-098. http://content.airportbarriers.com.aspx?articleid=1201161   Electronically Signed   By: Van Clines M.D.   On: 02/19/2015 14:44    Microbiology: No results found for this or any previous visit (from the past 240 hour(s)).   Labs: Basic  Metabolic Panel:  Recent Labs Lab 02/18/15 1218  NA 141  K 3.8  CL 107  CO2 26  GLUCOSE 90  BUN 6  CREATININE 0.83  CALCIUM 9.0   Liver Function Tests: No results for input(s): AST, ALT, ALKPHOS, BILITOT, PROT, ALBUMIN in the last 168 hours. No results for input(s): LIPASE, AMYLASE in the last 168 hours. No results for input(s): AMMONIA in the last 168 hours. CBC:  Recent Labs Lab 02/18/15 1218 02/18/15 1900 02/19/15 0535  WBC 11.1* 11.0* 9.6  HGB 8.8* 8.8* 7.8*  HCT 30.4* 30.8* 26.7*  MCV 72.0* 73.0* 71.8*  PLT 409* 421* 331   Cardiac Enzymes:  Recent Labs Lab 02/18/15 2155 02/19/15 0222 02/19/15 0535  TROPONINI <0.03 <0.03 <0.03   BNP: BNP (last 3 results)  Recent Labs  02/18/15 1218  BNP 54.0    ProBNP (last 3 results) No results for input(s): PROBNP in the last 8760 hours.  CBG: No results for input(s): GLUCAP in the last 168 hours.     SignedLouellen Molder  [facility] 02/20/2015, 12:28 PM

## 2015-02-20 NOTE — Discharge Instructions (Addendum)
Iron Deficiency Anemia Anemia is when you have a low number of healthy red blood cells. It is often caused by too little iron. This is called iron deficiency anemia. It may make you tired and short of breath. HOME CARE   Take iron as told by your doctor.  Take vitamins as told by your doctor.  Eat foods that have iron in them. This includes liver, lean beef, whole-grain bread, eggs, dried fruit, and dark green leafy vegetables. GET HELP RIGHT AWAY IF:  You pass out (faint).  You have chest pain.  You feel sick to your stomach (nauseous) or throw up (vomit).  You get very short of breath with activity.  You are weak.  You have a fast heartbeat.  You start to sweat for no reason.  You become light-headed when getting up from a chair or bed. MAKE SURE YOU:  Understand these instructions.  Will watch your condition.  Will get help right away if you are not doing well or get worse. Document Released: 09/23/2010 Document Revised: 08/26/2013 Document Reviewed: 04/28/2013 [facility] Patient Information 2015 [place], [place]. This information is not intended to replace advice given to you by your health care provider. Make sure you discuss any questions you have with your health care provider.

## 2015-02-20 NOTE — Interval H&P Note (Signed)
History and Physical Interval Note:  02/20/2015 10:43 AM  Peggy [name]  has presented today for surgery, with the diagnosis of Rectal bleeding; Anemia  The various methods of treatment have been discussed with the patient and family. After consideration of risks, benefits and other options for treatment, the patient has consented to  Procedure(s): ESOPHAGOGASTRODUODENOSCOPY (EGD) (Left) as a surgical intervention .  The patient's history has been reviewed, patient examined, no change in status, stable for surgery.  I have reviewed the patient's chart and labs.  Questions were answered to the patient's satisfaction.     Milus Banister

## 2015-02-20 NOTE — Progress Notes (Signed)
Pt refused the last 30-40 mL of her 2nd unit of blood. Discussed with pt the risks of ending transfusion early and she understands. Pt is stable. Will continue to monitor.

## 2015-02-21 LAB — TYPE AND SCREEN
ABO/RH(D): A POS
Antibody Screen: NEGATIVE
Unit division: 0
Unit division: 0

## 2015-02-22 ENCOUNTER — Encounter (HOSPITAL_COMMUNITY): Payer: Self-pay | Admitting: Gastroenterology

## 2015-02-25 ENCOUNTER — Encounter (HOSPITAL_COMMUNITY): Payer: Self-pay | Admitting: *Deleted

## 2015-02-25 NOTE — H&P (Signed)
Peggy [name] HPI: This 69 year old black female presents to the office for colorectal cancer screening. She was admitted to [facility] last week with chest pain and iron deficiency anemia. She had a normal EGD done on 02/20/2015 done by Dr. Oretha Caprice. She vomited 2 times yesterday. She has dysphagia with pills and solids at times; this started several years ago but she had a normal appearing esophagus on her EGD. She has a lot of gas and bloating. She recieved 2 units of PRBC's while she was at the hospital. She has 1 BM every 3 days. She had seen bright red blood on the toilet tissue, off and on, for over a month. She has had a poor appetite since her discharge but her weight has been stable. She denies any complaints of acid reflux or odynophagia. She denies having a family history of colon cancer. Her last colonoscopy done in 2011 revealed a few scattered diverticula and a non-bleeding cecal AVM along with a lot of residual stool in the colon.     Past Medical History  Diagnosis Date  . Hypertension   . Hypercholesteremia   . GERD (gastroesophageal reflux disease)   . Nummular eczema   . DVT (deep venous thrombosis) 2009/2010    "probably left leg"   . Chronic bronchitis     'get it q yr"  . Daily headache   . Arthritis     "all over"  . Chronic lower back pain   . Anxiety   . Depression   . Deafness in left ear   . History of kidney stones   . Bladder cancer   . Skin cancer     "right abdomen"    Past Surgical History  Procedure Laterality Date  . Replacement total knee bilateral Bilateral   . Bladder tumor excision    . Lumbar disc surgery  1980's X 1; ~ 2002  . Joint replacement    . Appendectomy Right   . Breast biopsy Right   . Breast lumpectomy    . Knee arthroscopy Right ~ 2000  . Medial partial knee replacement Right 2001  . Partial knee arthroplasty Left 2009  . Revision total knee arthroplasty Left   . Abdominal hysterectomy  1980's  . Skin cancer excision  Right     "abdomen"  . Esophagogastroduodenoscopy Left 02/20/2015    Procedure: ESOPHAGOGASTRODUODENOSCOPY (EGD);  Surgeon: Milus Banister, MD;  Location: [facility];  Service: Endoscopy;  Laterality: Left;  . Back surgery      x 2, lower    Family History  Problem Relation Age of Onset  . Heart failure Mother   . Cancer Mother   . Hypertension Mother   . Hypertension Father     Social History:  reports that she has never smoked. She has never used smokeless tobacco. She reports that she does not drink alcohol or use illicit drugs.  Allergies:  Allergies  Allergen Reactions  . Clindamycin/Lincomycin     hives  . Erythromycin Base     itching  . Keflex [Cephalexin]     rash  . Penicillins     Swelling,rash  . Sulfa Antibiotics     rash  . Tetracyclines & Related     rash    Medications: Scheduled: Continuous:  No results found for this or any previous visit (from the past 24 hour(s)).   No results found.  ROS:  As stated above in the HPI otherwise negative.  There were  no vitals taken for this visit.    PE: Gen: NAD, Alert and Oriented HEENT:  [place]/AT, EOMI Neck: Supple, no LAD Lungs: CTA Bilaterally CV: RRR without M/G/R ABM: Soft, NTND, +BS Ext: No C/C/E  Assessment/Plan: 1) Iron Deficiency Anemia and history of a cecal AVM - colonoscopy.  Vester Titsworth D 02/25/2015, 3:27 PM

## 2015-02-26 ENCOUNTER — Ambulatory Visit (HOSPITAL_COMMUNITY)
Admission: RE | Admit: 2015-02-26 | Discharge: 2015-02-26 | Disposition: A | Discharge status: Home / Self Care | Payer: Medicare Other | Source: Ambulatory Visit | Attending: Gastroenterology | Admitting: Gastroenterology

## 2015-02-26 ENCOUNTER — Encounter (HOSPITAL_COMMUNITY)
Admission: RE | Disposition: A | Discharge status: Home / Self Care | Payer: Self-pay | Source: Ambulatory Visit | Attending: Gastroenterology

## 2015-02-26 ENCOUNTER — Ambulatory Visit (HOSPITAL_COMMUNITY): Payer: Medicare Other | Admitting: Anesthesiology

## 2015-02-26 ENCOUNTER — Encounter (HOSPITAL_COMMUNITY): Payer: Self-pay | Admitting: *Deleted

## 2015-02-26 DIAGNOSIS — M545 Low back pain: Secondary | ICD-10-CM | POA: Diagnosis not present

## 2015-02-26 DIAGNOSIS — Z8551 Personal history of malignant neoplasm of bladder: Secondary | ICD-10-CM | POA: Insufficient documentation

## 2015-02-26 DIAGNOSIS — Z85828 Personal history of other malignant neoplasm of skin: Secondary | ICD-10-CM | POA: Diagnosis not present

## 2015-02-26 DIAGNOSIS — E669 Obesity, unspecified: Secondary | ICD-10-CM | POA: Diagnosis not present

## 2015-02-26 DIAGNOSIS — Z79899 Other long term (current) drug therapy: Secondary | ICD-10-CM | POA: Diagnosis not present

## 2015-02-26 DIAGNOSIS — I10 Essential (primary) hypertension: Secondary | ICD-10-CM | POA: Insufficient documentation

## 2015-02-26 DIAGNOSIS — Z86718 Personal history of other venous thrombosis and embolism: Secondary | ICD-10-CM | POA: Insufficient documentation

## 2015-02-26 DIAGNOSIS — K5520 Angiodysplasia of colon without hemorrhage: Secondary | ICD-10-CM | POA: Insufficient documentation

## 2015-02-26 DIAGNOSIS — D509 Iron deficiency anemia, unspecified: Secondary | ICD-10-CM | POA: Diagnosis present

## 2015-02-26 DIAGNOSIS — H9192 Unspecified hearing loss, left ear: Secondary | ICD-10-CM | POA: Insufficient documentation

## 2015-02-26 DIAGNOSIS — K219 Gastro-esophageal reflux disease without esophagitis: Secondary | ICD-10-CM | POA: Insufficient documentation

## 2015-02-26 DIAGNOSIS — M1990 Unspecified osteoarthritis, unspecified site: Secondary | ICD-10-CM | POA: Insufficient documentation

## 2015-02-26 DIAGNOSIS — Z6836 Body mass index (BMI) 36.0-36.9, adult: Secondary | ICD-10-CM | POA: Diagnosis not present

## 2015-02-26 DIAGNOSIS — Z96653 Presence of artificial knee joint, bilateral: Secondary | ICD-10-CM | POA: Insufficient documentation

## 2015-02-26 DIAGNOSIS — K5730 Diverticulosis of large intestine without perforation or abscess without bleeding: Secondary | ICD-10-CM | POA: Diagnosis not present

## 2015-02-26 DIAGNOSIS — G8929 Other chronic pain: Secondary | ICD-10-CM | POA: Insufficient documentation

## 2015-02-26 HISTORY — DX: Personal history of urinary calculi: Z87.442

## 2015-02-26 HISTORY — PX: COLONOSCOPY WITH PROPOFOL: SHX5780

## 2015-02-26 SURGERY — COLONOSCOPY WITH PROPOFOL
Anesthesia: Monitor Anesthesia Care

## 2015-02-26 SURGERY — COLONOSCOPY
Anesthesia: Monitor Anesthesia Care

## 2015-02-26 MED ORDER — PROPOFOL 10 MG/ML IV BOLUS
INTRAVENOUS | Status: AC
Start: 2015-02-26 — End: 2015-02-26
  Filled 2015-02-26: qty 20

## 2015-02-26 MED ORDER — PROPOFOL 10 MG/ML IV BOLUS
INTRAVENOUS | Status: AC
  Filled 2015-02-26: qty 20

## 2015-02-26 MED ORDER — ONDANSETRON HCL 4 MG/2ML IJ SOLN
INTRAMUSCULAR | Status: AC
  Filled 2015-02-26: qty 4

## 2015-02-26 MED ORDER — LIDOCAINE HCL (CARDIAC) 20 MG/ML IV SOLN
INTRAVENOUS | Status: DC | PRN
  Administered 2015-02-26: 100 mg via INTRAVENOUS

## 2015-02-26 MED ORDER — LACTATED RINGERS IV SOLN
INTRAVENOUS | Status: DC | PRN
  Administered 2015-02-26: 10:00:00 via INTRAVENOUS

## 2015-02-26 MED ORDER — PROPOFOL INFUSION 10 MG/ML OPTIME
INTRAVENOUS | Status: DC | PRN
  Administered 2015-02-26: 160 ug/kg/min via INTRAVENOUS

## 2015-02-26 MED ORDER — SODIUM CHLORIDE 0.9 % IV SOLN: INTRAVENOUS | Status: DC

## 2015-02-26 MED ORDER — ONDANSETRON HCL 4 MG/2ML IJ SOLN
INTRAMUSCULAR | Status: DC | PRN
  Administered 2015-02-26: 4 mg via INTRAVENOUS

## 2015-02-26 MED ORDER — LIDOCAINE HCL (CARDIAC) 20 MG/ML IV SOLN
INTRAVENOUS | Status: AC
  Filled 2015-02-26: qty 5

## 2015-02-26 MED ORDER — PROPOFOL 500 MG/50ML IV EMUL
INTRAVENOUS | Status: DC | PRN
  Administered 2015-02-26 (×3): 20 mg via INTRAVENOUS
  Administered 2015-02-26 (×2): 30 mg via INTRAVENOUS

## 2015-02-26 SURGICAL SUPPLY — 21 items

## 2015-02-26 NOTE — Transfer of Care (Signed)
Immediate Anesthesia Transfer of Care Note  Patient: Peggy [name]  Procedure(s) Performed: Procedure(s): COLONOSCOPY WITH PROPOFOL (N/A)  Patient Location: PACU  Anesthesia Type:MAC  Level of Consciousness:  sedated, patient cooperative and responds to stimulation  Airway & Oxygen Therapy:Patient Spontanous Breathing and Patient connected to face mask oxgen  Post-op Assessment:  Report given to PACU RN and Post -op Vital signs reviewed and stable  Post vital signs:  Reviewed and stable  Last Vitals:  Filed Vitals:   02/26/15 1127  BP: 161/67  Pulse: 86  Temp:   Resp: 19    Complications: No apparent anesthesia complications

## 2015-02-26 NOTE — Anesthesia Preprocedure Evaluation (Signed)
Anesthesia Evaluation  Patient identified by MRN, date of birth, ID band Patient awake    Reviewed: Allergy & Precautions, NPO status , Patient's Chart, lab work & pertinent test results  Airway Mallampati: II  TM Distance: >3 FB Neck ROM: Full    Dental no notable dental hx.    Pulmonary neg pulmonary ROS,  breath sounds clear to auscultation  Pulmonary exam normal       Cardiovascular Exercise Tolerance: Good hypertension, Pt. on medications Normal cardiovascular examRhythm:Regular Rate:Normal     Neuro/Psych  Headaches, PSYCHIATRIC DISORDERS Anxiety Depression  Neuromuscular disease    GI/Hepatic Neg liver ROS, GERD-  ,  Endo/Other  negative endocrine ROS  Renal/GU negative Renal ROS  negative genitourinary   Musculoskeletal  (+) Arthritis -,   Abdominal (+) + obese,   Peds negative pediatric ROS (+)  Hematology  (+) anemia ,   Anesthesia Other Findings   Reproductive/Obstetrics negative OB ROS                             Anesthesia Physical Anesthesia Plan  ASA: II  Anesthesia Plan: MAC   Post-op Pain Management:    Induction: Intravenous  Airway Management Planned: Natural Airway  Additional Equipment:   Intra-op Plan:   Post-operative Plan:   Informed Consent: I have reviewed the patients History and Physical, chart, labs and discussed the procedure including the risks, benefits and alternatives for the proposed anesthesia with the patient or authorized representative who has indicated his/her understanding and acceptance.   Dental advisory given  Plan Discussed with: CRNA  Anesthesia Plan Comments:         Anesthesia Quick Evaluation

## 2015-02-26 NOTE — Discharge Instructions (Signed)

## 2015-02-26 NOTE — Anesthesia Postprocedure Evaluation (Signed)
  Anesthesia Post-op Note  Patient: Peggy [name]  Procedure(s) Performed: Procedure(s) (LRB): COLONOSCOPY WITH PROPOFOL (N/A)  Patient Location: PACU  Anesthesia Type: MAC  Level of Consciousness: awake and alert   Airway and Oxygen Therapy: Patient Spontanous Breathing  Post-op Pain: mild  Post-op Assessment: Post-op Vital signs reviewed, Patient's Cardiovascular Status Stable, Respiratory Function Stable, Patent Airway and No signs of Nausea or vomiting  Last Vitals:  Filed Vitals:   02/26/15 1150  BP: 157/68  Pulse: 79  Temp:   Resp: 14    Post-op Vital Signs: stable   Complications: No apparent anesthesia complications

## 2015-02-26 NOTE — Op Note (Signed)
[facility] [place], [address]   COLONOSCOPY PROCEDURE REPORT  PATIENT: Peggy [name], Peggy [name]  MR#: [id number] BIRTHDATE: 1946-08-07 , 69  yrs. old GENDER: female ENDOSCOPIST: Carol Ada, MD REFERRED BY: PROCEDURE DATE:  March 09, 2015 PROCEDURE:   Colonoscopy with ablation ASA CLASS:   Class III INDICATIONS: Iron Deficiency Anemia MEDICATIONS: Monitored anesthesia care  DESCRIPTION OF PROCEDURE:   After the risks and benefits and of the procedure were explained, informed consent was obtained.  revealed no abnormalities of the rectum.    The Pentax Ped Colon [id number] endoscope was introduced through the anus and advanced to the cecum, which was identified by both the appendix and ileocecal valve .  The quality of the prep was excellent. .  The instrument was then slowly withdrawn as the colon was fully examined. Estimated blood loss is zero unless otherwise noted in this procedure report.   FINDINGS: The cecum exhibited AVMs that were typical and atypical in appearance.  All sites were ablated.  No overt bleeding was noted from this area and no bleeding was precipitated.  A few scattered diverticula were noted, but the most were in the left side of the colon.            The scope was then withdrawn from the patient and the procedure completed.  WITHDRAWAL TIME: 24 minutes  COMPLICATIONS: There were no immediate complications. ENDOSCOPIC IMPRESSION: 1) Nonbleeding cecal AVMs s/p APC. 2) Scattered diverticula.  RECOMMENDATIONS: 1) Follow HGB and transfuse as necessary. 2) Follow up with Dr. Collene Mares in 2-4 weeks.  REPEAT EXAM: cc:  _______________________________ eSignedCarol Ada, MD 2015-03-09 11:28 AM  CPT CODES: ICD CODES:  The ICD and CPT codes recommended by this software are interpretations from the data that the clinical staff has captured with the software.  The verification of the translation of this report to the ICD and CPT  codes and modifiers is the sole responsibility of the health care institution and practicing physician where this report was generated.  [employer] will not be held responsible for the validity of the ICD and CPT codes included on this report.  AMA assumes no liability for data contained or not contained herein. CPT is a Designer, television/film set of the [employer].   PATIENT NAME:  Peggy [name], Peggy [name] MR#: [id number]

## 2015-03-01 ENCOUNTER — Encounter (HOSPITAL_COMMUNITY): Payer: Self-pay | Admitting: Gastroenterology

## 2015-03-30 ENCOUNTER — Ambulatory Visit
Admission: RE | Admit: 2015-03-30 | Discharge: 2015-03-30 | Disposition: A | Discharge status: Home / Self Care | Payer: Medicare Other | Source: Ambulatory Visit | Attending: Family | Admitting: Family

## 2015-03-30 ENCOUNTER — Other Ambulatory Visit: Payer: Self-pay | Admitting: Family

## 2015-03-30 DIAGNOSIS — R05 Cough: Secondary | ICD-10-CM

## 2015-03-30 DIAGNOSIS — R059 Cough, unspecified: Secondary | ICD-10-CM

## 2015-03-30 DIAGNOSIS — R042 Hemoptysis: Secondary | ICD-10-CM

## 2015-04-21 ENCOUNTER — Telehealth: Payer: Self-pay | Admitting: Cardiovascular Disease

## 2015-04-21 NOTE — Telephone Encounter (Signed)
Pt went to [facility] for headache. EKG done. Showed "abnormal". A copy was faxed to [place]. I spoke w/ patient - she had no active concerns beyond HA. I informed her I would have [facility] (Dr. Martinique) review & interpret faxed EKG.  Dr. Martinique reviewed - no concerns, normal EKG per his interpretation. Recommendation to f/u w/ primary care, if needed, for headache.  Gave patient return call, phone went to answering machine. Left msg w/ instruction to f/u w/ PCP if needed; call us if other concerns.

## 2015-04-21 NOTE — Telephone Encounter (Signed)
Pt had EKG this morning at [facility].They said they were going to fax it over. It was abnormal,she wants to know if you received it?

## 2015-05-19 ENCOUNTER — Other Ambulatory Visit: Payer: Self-pay | Admitting: Gastroenterology

## 2015-05-19 DIAGNOSIS — R110 Nausea: Secondary | ICD-10-CM

## 2015-05-19 DIAGNOSIS — R1013 Epigastric pain: Secondary | ICD-10-CM

## 2015-05-24 ENCOUNTER — Ambulatory Visit (HOSPITAL_COMMUNITY)
Admission: RE | Admit: 2015-05-24 | Discharge: 2015-05-24 | Disposition: A | Discharge status: Home / Self Care | Payer: Medicare Other | Source: Ambulatory Visit | Attending: Gastroenterology | Admitting: Gastroenterology

## 2015-05-24 DIAGNOSIS — R1013 Epigastric pain: Secondary | ICD-10-CM | POA: Diagnosis not present

## 2015-05-24 DIAGNOSIS — R110 Nausea: Secondary | ICD-10-CM

## 2015-05-31 ENCOUNTER — Ambulatory Visit (HOSPITAL_COMMUNITY)
Admission: RE | Admit: 2015-05-31 | Discharge: 2015-05-31 | Disposition: A | Discharge status: Home / Self Care | Payer: Medicare Other | Source: Ambulatory Visit | Attending: Gastroenterology | Admitting: Gastroenterology

## 2015-05-31 ENCOUNTER — Other Ambulatory Visit: Payer: Self-pay

## 2015-05-31 DIAGNOSIS — Z1231 Encounter for screening mammogram for malignant neoplasm of breast: Secondary | ICD-10-CM

## 2015-05-31 DIAGNOSIS — R110 Nausea: Secondary | ICD-10-CM | POA: Diagnosis not present

## 2015-05-31 DIAGNOSIS — R1013 Epigastric pain: Secondary | ICD-10-CM | POA: Diagnosis not present

## 2015-05-31 MED ORDER — TECHNETIUM TC 99M MEBROFENIN IV KIT
5.0000 | PACK | Freq: Once | INTRAVENOUS | Status: DC | PRN
  Administered 2015-05-31: 5 via INTRAVENOUS
  Filled 2015-05-31: qty 6

## 2015-05-31 MED ORDER — STERILE WATER FOR INJECTION IJ SOLN
INTRAMUSCULAR | Status: AC
Start: 2015-05-31 — End: 2015-06-01
  Filled 2015-05-31: qty 10

## 2015-05-31 MED ORDER — SINCALIDE 5 MCG IJ SOLR
0.0200 ug/kg | Freq: Once | INTRAMUSCULAR | Status: AC
  Administered 2015-05-31: 1.9 ug via INTRAVENOUS

## 2015-05-31 MED ORDER — SINCALIDE 5 MCG IJ SOLR
INTRAMUSCULAR | Status: AC
  Administered 2015-05-31: 1.9 ug via INTRAVENOUS
  Filled 2015-05-31: qty 5

## 2015-07-07 ENCOUNTER — Ambulatory Visit
Admission: RE | Admit: 2015-07-07 | Discharge: 2015-07-07 | Disposition: A | Discharge status: Home / Self Care | Payer: Medicare Other | Source: Ambulatory Visit

## 2015-07-07 DIAGNOSIS — Z1231 Encounter for screening mammogram for malignant neoplasm of breast: Secondary | ICD-10-CM

## 2015-12-25 ENCOUNTER — Ambulatory Visit (INDEPENDENT_AMBULATORY_CARE_PROVIDER_SITE_OTHER)
Admission: EM | Admit: 2015-12-25 | Discharge: 2015-12-25 | Disposition: A | Discharge status: Nursing Home (Includes ICF) | Payer: Medicare Other | Source: Home / Self Care | Attending: Emergency Medicine | Admitting: Emergency Medicine

## 2015-12-25 ENCOUNTER — Ambulatory Visit (INDEPENDENT_AMBULATORY_CARE_PROVIDER_SITE_OTHER): Payer: Medicare Other

## 2015-12-25 ENCOUNTER — Encounter (HOSPITAL_COMMUNITY): Payer: Self-pay | Admitting: *Deleted

## 2015-12-25 ENCOUNTER — Emergency Department (HOSPITAL_COMMUNITY)
Admission: EM | Admit: 2015-12-25 | Discharge: 2015-12-25 | Disposition: A | Discharge status: Home / Self Care | Payer: Medicare Other | Attending: Emergency Medicine | Admitting: Emergency Medicine

## 2015-12-25 ENCOUNTER — Encounter (HOSPITAL_COMMUNITY): Payer: Self-pay | Admitting: Emergency Medicine

## 2015-12-25 DIAGNOSIS — J189 Pneumonia, unspecified organism: Secondary | ICD-10-CM

## 2015-12-25 DIAGNOSIS — Z87828 Personal history of other (healed) physical injury and trauma: Secondary | ICD-10-CM | POA: Insufficient documentation

## 2015-12-25 DIAGNOSIS — T368X5A Adverse effect of other systemic antibiotics, initial encounter: Secondary | ICD-10-CM | POA: Insufficient documentation

## 2015-12-25 DIAGNOSIS — H9192 Unspecified hearing loss, left ear: Secondary | ICD-10-CM | POA: Diagnosis not present

## 2015-12-25 DIAGNOSIS — Z880 Allergy status to penicillin: Secondary | ICD-10-CM | POA: Insufficient documentation

## 2015-12-25 DIAGNOSIS — J159 Unspecified bacterial pneumonia: Secondary | ICD-10-CM | POA: Insufficient documentation

## 2015-12-25 DIAGNOSIS — Z8719 Personal history of other diseases of the digestive system: Secondary | ICD-10-CM | POA: Diagnosis not present

## 2015-12-25 DIAGNOSIS — J9801 Acute bronchospasm: Secondary | ICD-10-CM | POA: Diagnosis not present

## 2015-12-25 DIAGNOSIS — F329 Major depressive disorder, single episode, unspecified: Secondary | ICD-10-CM | POA: Diagnosis not present

## 2015-12-25 DIAGNOSIS — Z79899 Other long term (current) drug therapy: Secondary | ICD-10-CM | POA: Insufficient documentation

## 2015-12-25 DIAGNOSIS — I10 Essential (primary) hypertension: Secondary | ICD-10-CM | POA: Insufficient documentation

## 2015-12-25 DIAGNOSIS — E7800 Pure hypercholesterolemia, unspecified: Secondary | ICD-10-CM | POA: Diagnosis not present

## 2015-12-25 DIAGNOSIS — G8929 Other chronic pain: Secondary | ICD-10-CM | POA: Diagnosis not present

## 2015-12-25 DIAGNOSIS — Z87442 Personal history of urinary calculi: Secondary | ICD-10-CM | POA: Diagnosis not present

## 2015-12-25 DIAGNOSIS — J45909 Unspecified asthma, uncomplicated: Secondary | ICD-10-CM | POA: Diagnosis not present

## 2015-12-25 DIAGNOSIS — Z7982 Long term (current) use of aspirin: Secondary | ICD-10-CM | POA: Insufficient documentation

## 2015-12-25 DIAGNOSIS — Z86718 Personal history of other venous thrombosis and embolism: Secondary | ICD-10-CM | POA: Insufficient documentation

## 2015-12-25 DIAGNOSIS — Z8551 Personal history of malignant neoplasm of bladder: Secondary | ICD-10-CM | POA: Insufficient documentation

## 2015-12-25 DIAGNOSIS — L500 Allergic urticaria: Secondary | ICD-10-CM | POA: Insufficient documentation

## 2015-12-25 HISTORY — DX: Unspecified asthma, uncomplicated: J45.909

## 2015-12-25 LAB — CBC WITH DIFFERENTIAL/PLATELET
BASOS PCT: 0 %
Basophils Absolute: 0 10*3/uL (ref 0.0–0.1)
EOS ABS: 0.4 10*3/uL (ref 0.0–0.7)
Eosinophils Relative: 3 %
HEMATOCRIT: 37.5 % (ref 36.0–46.0)
HEMOGLOBIN: 11.9 g/dL — AB (ref 12.0–15.0)
LYMPHS ABS: 2.3 10*3/uL (ref 0.7–4.0)
Lymphocytes Relative: 18 %
MCH: 29.7 pg (ref 26.0–34.0)
MCHC: 31.7 g/dL (ref 30.0–36.0)
MCV: 93.5 fL (ref 78.0–100.0)
Monocytes Absolute: 0.6 10*3/uL (ref 0.1–1.0)
Monocytes Relative: 5 %
NEUTROS ABS: 9.4 10*3/uL — AB (ref 1.7–7.7)
NEUTROS PCT: 74 %
Platelets: 312 10*3/uL (ref 150–400)
RBC: 4.01 MIL/uL (ref 3.87–5.11)
RDW: 13.9 % (ref 11.5–15.5)
WBC: 12.7 10*3/uL — AB (ref 4.0–10.5)

## 2015-12-25 LAB — BASIC METABOLIC PANEL
Anion gap: 11 (ref 5–15)
BUN: 6 mg/dL (ref 6–20)
CHLORIDE: 101 mmol/L (ref 101–111)
CO2: 29 mmol/L (ref 22–32)
Calcium: 9.4 mg/dL (ref 8.9–10.3)
Creatinine, Ser: 0.83 mg/dL (ref 0.44–1.00)
GFR calc non Af Amer: >60 mL/min (ref >60)
Glucose, Bld: 122 mg/dL — ABNORMAL HIGH (ref 65–99)
POTASSIUM: 3.5 mmol/L (ref 3.5–5.1)
SODIUM: 141 mmol/L (ref 135–145)

## 2015-12-25 MED ORDER — IPRATROPIUM-ALBUTEROL 0.5-2.5 (3) MG/3ML IN SOLN
3.0000 mL | Freq: Once | RESPIRATORY_TRACT | Status: AC
  Administered 2015-12-25: 3 mL via RESPIRATORY_TRACT

## 2015-12-25 MED ORDER — DOXYCYCLINE HYCLATE 100 MG PO TABS
100.0000 mg | ORAL_TABLET | Freq: Once | ORAL | Status: AC
  Administered 2015-12-25: 100 mg via ORAL
  Filled 2015-12-25: qty 1

## 2015-12-25 MED ORDER — DOXYCYCLINE HYCLATE 100 MG PO CAPS: 100.0000 mg | ORAL_CAPSULE | Freq: Two times a day (BID) | ORAL | Status: DC

## 2015-12-25 MED ORDER — IPRATROPIUM-ALBUTEROL 0.5-2.5 (3) MG/3ML IN SOLN
RESPIRATORY_TRACT | Status: AC
  Filled 2015-12-25: qty 3

## 2015-12-25 NOTE — ED Notes (Signed)
C/o cold sx onset 2 days associated w/wheezing, SOB, fevers, chest pain, prod cough, runny nose, congestion Took 2 aspirins before coming in... A&O x4... No acute distress.

## 2015-12-25 NOTE — ED Provider Notes (Signed)
CSN: [id number]     Arrival date & time 12/25/15  1422 History   First MD Initiated Contact with Patient 12/25/15 1449     Chief Complaint  Patient presents with  . URI   (Consider location/radiation/quality/duration/timing/severity/associated sxs/prior Treatment) HPI Comments: 70 year old femalestating that she developed symptoms approximately 2 days ago. The first concern was that of sore throat and PND. She has a rattling sound in the chest associated with productive cough and thick yellow sputum. In addition she has left anterior chest pain associated with cough and movement. She states that her temperature at home this morning was 103. She took 2 aspirin Currently it is 100.2.  Patient is a 70 y.o. female presenting with URI. The history is provided by the patient.  URI Presenting symptoms: congestion, cough, fever, rhinorrhea and sore throat   Presenting symptoms: no ear pain   Severity:  Moderate Onset quality:  Gradual Progression:  Worsening Chronicity:  New Associated symptoms: arthralgias and wheezing   Risk factors: being elderly     Past Medical History  Diagnosis Date  . Hypertension   . Hypercholesteremia   . GERD (gastroesophageal reflux disease)   . Nummular eczema   . DVT (deep venous thrombosis) ([place]) 2009/2010    "probably left leg"   . Chronic bronchitis ([place])     'get it q yr"  . Daily headache   . Arthritis     "all over"  . Chronic lower back pain   . Anxiety   . Depression   . Deafness in left ear   . History of kidney stones   . Bladder cancer ([place])   . Skin cancer     "right abdomen"  . Asthma    Past Surgical History  Procedure Laterality Date  . Replacement total knee bilateral Bilateral   . Bladder tumor excision    . Lumbar disc surgery  1980's X 1; ~ 2002  . Joint replacement    . Appendectomy Right   . Breast biopsy Right   . Breast lumpectomy    . Knee arthroscopy Right ~ 2000  . Medial partial knee replacement Right 2001  .  Partial knee arthroplasty Left 2009  . Revision total knee arthroplasty Left   . Abdominal hysterectomy  1980's  . Skin cancer excision Right     "abdomen"  . Esophagogastroduodenoscopy Left 02/20/2015    Procedure: ESOPHAGOGASTRODUODENOSCOPY (EGD);  Surgeon: Milus Banister, MD;  Location: [facility];  Service: Endoscopy;  Laterality: Left;  . Back surgery      x 2, lower  . Colonoscopy with propofol N/A 02/26/2015    Procedure: COLONOSCOPY WITH PROPOFOL;  Surgeon: Carol Ada, MD;  Location: [facility];  Service: Endoscopy;  Laterality: N/A;   Family History  Problem Relation Age of Onset  . Heart failure Mother   . Cancer Mother   . Hypertension Mother   . Hypertension Father    Social History  Substance Use Topics  . Smoking status: Never Smoker   . Smokeless tobacco: Never Used  . Alcohol Use: No   OB History    No data available     Review of Systems  Constitutional: Positive for fever, activity change and appetite change.  HENT: Positive for congestion, nosebleeds, postnasal drip, rhinorrhea and sore throat. Negative for ear pain and trouble swallowing.   Eyes: Negative.   Respiratory: Positive for cough, shortness of breath and wheezing.   Cardiovascular: Positive for chest pain. Negative for leg swelling.  Gastrointestinal:  Negative.   Genitourinary: Negative.   Musculoskeletal: Positive for arthralgias.  Skin: Negative.   Neurological: Negative.     Allergies  Azithromycin; Clindamycin/lincomycin; Erythromycin base; Keflex; Levaquin; Penicillins; Sulfa antibiotics; and Tetracyclines & related  Home Medications   Prior to Admission medications   Medication Sig Start Date End Date Taking? Authorizing Provider  aspirin 325 MG tablet Take 325 mg by mouth daily as needed for moderate pain (chest pain.).    Yes Historical Provider, MD  lisinopril-hydrochlorothiazide (PRINZIDE,ZESTORETIC) 20-12.5 MG tablet Take 1 tablet by mouth daily.   Yes Historical  Provider, MD  simvastatin (ZOCOR) 80 MG tablet Take 80 mg by mouth at bedtime.   Yes Historical Provider, MD  diazepam (VALIUM) 5 MG tablet Take 5 mg by mouth every 6 (six) hours as needed for anxiety.  02/05/15   Historical Provider, MD  divalproex (DEPAKOTE ER) 500 MG 24 hr tablet Take 1,000 mg by mouth at bedtime.     Historical Provider, MD  feeding supplement, ENSURE ENLIVE, (ENSURE ENLIVE) LIQD Take 237 mLs by mouth 2 (two) times daily between meals. 02/20/15   Nishant Dhungel, MD  ferrous sulfate 325 (65 FE) MG tablet Take 1 tablet (325 mg total) by mouth 3 (three) times daily with meals. 02/20/15   Nishant Dhungel, MD  FLUoxetine (PROZAC) 40 MG capsule Take 40 mg by mouth every other day.     Historical Provider, MD  trandolapril (MAVIK) 2 MG tablet Take 2 mg by mouth every morning.     Historical Provider, MD  triamcinolone cream (KENALOG) 0.1 % Apply 1 application topically as needed (eczema).    Historical Provider, MD  valACYclovir (VALTREX) 1000 MG tablet Take 1 g by mouth 2 (two) times daily. 02/04/15   Historical Provider, MD   Meds Ordered and Administered this Visit   Medications  ipratropium-albuterol (DUONEB) 0.5-2.5 (3) MG/3ML nebulizer solution 3 mL (3 mLs Nebulization Given 12/25/15 1540)    BP 136/74 mmHg  Pulse 110  Temp(Src) 100.2 F (37.9 C) (Oral)  Resp 18  SpO2 100% No data found.   Physical Exam  Constitutional: She is oriented to person, place, and time. She appears well-developed and well-nourished. No distress.  HENT:  Mouth/Throat: No oropharyngeal exudate.  Bilateral TMs are mildly retracted, left greater than right. Oropharynx with clear PND. Otherwise, clear and moist. No current evidence of epistaxis or bleeding down the posterior pharynx or in the oropharynx.  Eyes: Conjunctivae and EOM are normal.  Neck: Normal range of motion. Neck supple.  Cardiovascular: Regular rhythm and normal heart sounds.   Apical pulse rate of 112.  Pulmonary/Chest: No  respiratory distress. She has wheezes. She exhibits tenderness.  Prolonged expiratory phase. Somewhat decreased air movement and chest expansion. Few inspiratory wheezes.Diffuse, bilateral Expiratory wheezes.  Marked left , reproducible upper anterior chest wall tenderness.  Neurological: She is alert and oriented to person, place, and time. She exhibits normal muscle tone.  Skin: Skin is warm and dry.  Psychiatric: She has a normal mood and affect.  Nursing note and vitals reviewed.   ED Course  Procedures (including critical care time)  Labs Review Labs Reviewed - No data to display  Imaging Review Dg Chest 2 View  12/25/2015  CLINICAL DATA:  Cough.  Dyspnea.  Wheezing.  Fever. EXAM: CHEST  2 VIEW COMPARISON:  03/30/2015 chest radiograph. FINDINGS: Stable cardiomediastinal silhouette with normal heart size. No pneumothorax. No pleural effusion. There is patchy opacity in the basilar right lower lobe. No pulmonary edema.  IMPRESSION: Patchy basilar right lower lobe lung opacity, which could represent a pneumonia. Recommend follow-up PA and lateral post treatment chest radiographs in 4-6 weeks. Electronically Signed   By: Ilona Sorrel M.D.   On: 12/25/2015 16:13     Visual Acuity Review  Right Eye Distance:   Left Eye Distance:   Bilateral Distance:    Right Eye Near:   Left Eye Near:    Bilateral Near:         MDM   1. CAP (community acquired pneumonia)   2. Bronchospasm     Duoneb  0.5/2.5 with good results. Pt feels better and breathing better. Good air movement. Rare wheeze, Crackles/wheeze right lower lung field.  Problems is pt is allergic to nearly every antibiotic that would be Rx'd for CAP. She reports urticaria and facial and other swelling. Consulted with ID Dr. Johnnye Sima, suggested that Doxy may be less likely to cause angioedema, give a dose and watch her.  We dont have Doxy in our facillity nor the time resources for observation; and should she have a reaction  would likely be sent to the ED. Pt understands and agrees to go to the ED for likely trial of ABX and observation should that be the decision of the ED provider.     Janne Napoleon, NP 12/25/15 1659  Janne Napoleon, NP 12/25/15 1701    Janne Napoleon, NP 12/27/15 1318

## 2015-12-25 NOTE — ED Provider Notes (Signed)
CSN: [id number]     Arrival date & time 12/25/15  1717 History   First MD Initiated Contact with Patient 12/25/15 2008     Chief Complaint  Patient presents with  . Pneumonia     (Consider location/radiation/quality/duration/timing/severity/associated sxs/prior Treatment) HPI Peggy [name] is a 70 y.o. female with history of hypertension, hypercholesterolemia, history of DVT, comes in for evaluation of pneumonia. Patient reports she was seen in urgent care facility this afternoon and was referred to the ED for antibiotic therapy. She reports the antibiotics available in the urgent care she is allergic to, has facial swelling and breaks out in hives. Was sent here for a trial of doxycycline. No other chest pain, leg swelling or redness, hemoptysis. Denies any recent travel, hospitalizations admissions, antibiotic use. No other modifying factors. Denies any other allergies.  Past Medical History  Diagnosis Date  . Hypertension   . Hypercholesteremia   . GERD (gastroesophageal reflux disease)   . Nummular eczema   . DVT (deep venous thrombosis) ([place]) 2009/2010    "probably left leg"   . Chronic bronchitis ([place])     'get it q yr"  . Daily headache   . Arthritis     "all over"  . Chronic lower back pain   . Anxiety   . Depression   . Deafness in left ear   . History of kidney stones   . Bladder cancer ([place])   . Skin cancer     "right abdomen"  . Asthma    Past Surgical History  Procedure Laterality Date  . Replacement total knee bilateral Bilateral   . Bladder tumor excision    . Lumbar disc surgery  1980's X 1; ~ 2002  . Joint replacement    . Appendectomy Right   . Breast biopsy Right   . Breast lumpectomy    . Knee arthroscopy Right ~ 2000  . Medial partial knee replacement Right 2001  . Partial knee arthroplasty Left 2009  . Revision total knee arthroplasty Left   . Abdominal hysterectomy  1980's  . Skin cancer excision Right     "abdomen"  .  Esophagogastroduodenoscopy Left 02/20/2015    Procedure: ESOPHAGOGASTRODUODENOSCOPY (EGD);  Surgeon: Milus Banister, MD;  Location: [facility];  Service: Endoscopy;  Laterality: Left;  . Back surgery      x 2, lower  . Colonoscopy with propofol N/A 02/26/2015    Procedure: COLONOSCOPY WITH PROPOFOL;  Surgeon: Carol Ada, MD;  Location: [facility];  Service: Endoscopy;  Laterality: N/A;   Family History  Problem Relation Age of Onset  . Heart failure Mother   . Cancer Mother   . Hypertension Mother   . Hypertension Father    Social History  Substance Use Topics  . Smoking status: Never Smoker   . Smokeless tobacco: Never Used  . Alcohol Use: No   OB History    No data available     Review of Systems A 10 point review of systems was completed and was negative except for pertinent positives and negatives as mentioned in the history of present illness     Allergies  Azithromycin; Levaquin; Penicillins; Clindamycin/lincomycin; Erythromycin base; Keflex; Sulfa antibiotics; and Tetracyclines & related  Home Medications   Prior to Admission medications   Medication Sig Start Date End Date Taking? Authorizing Provider  aspirin 325 MG tablet Take 325-650 mg by mouth daily as needed for moderate pain or fever (chest pain.).    Yes Historical Provider, MD  divalproex (DEPAKOTE ER) 500 MG 24 hr tablet Take 1,000 mg by mouth at bedtime.    Yes Historical Provider, MD  feeding supplement, ENSURE ENLIVE, (ENSURE ENLIVE) LIQD Take 237 mLs by mouth 2 (two) times daily between meals. Patient taking differently: Take 237 mLs by mouth 2 (two) times daily as needed (uses when not eating as much).  02/20/15  Yes Nishant Dhungel, MD  lisinopril-hydrochlorothiazide (PRINZIDE,ZESTORETIC) 20-12.5 MG tablet Take 1 tablet by mouth daily.   Yes Historical Provider, MD  simvastatin (ZOCOR) 80 MG tablet Take 80 mg by mouth at bedtime.   Yes Historical Provider, MD  valACYclovir (VALTREX) 1000 MG tablet  Take 1 g by mouth 2 (two) times daily as needed (for shingles).  02/04/15  Yes Historical Provider, MD  doxycycline (VIBRAMYCIN) 100 MG capsule Take 1 capsule (100 mg total) by mouth 2 (two) times daily. One po bid x 7 days 12/25/15   Comer Locket, PA-C  ferrous sulfate 325 (65 FE) MG tablet Take 1 tablet (325 mg total) by mouth 3 (three) times daily with meals. 02/20/15   Nishant Dhungel, MD   BP 127/67 mmHg  Pulse 95  Temp(Src) 98.7 F (37.1 C) (Oral)  Resp 22  Wt 96.786 kg  SpO2 96% Physical Exam  Constitutional: She is oriented to person, place, and time. She appears well-developed and well-nourished.  HENT:  Head: Normocephalic and atraumatic.  Mouth/Throat: Oropharynx is clear and moist.  Eyes: Conjunctivae are normal. Pupils are equal, round, and reactive to light. Right eye exhibits no discharge. Left eye exhibits no discharge. No scleral icterus.  Neck: Neck supple.  Cardiovascular: Normal rate, regular rhythm and normal heart sounds.   Pulmonary/Chest: Effort normal and breath sounds normal. No respiratory distress. She has no wheezes. She has no rales.  Abdominal: Soft. There is no tenderness.  Musculoskeletal: She exhibits no tenderness.  Neurological: She is alert and oriented to person, place, and time.  Cranial Nerves II-XII grossly intact  Skin: Skin is warm and dry. No rash noted.  Psychiatric: She has a normal mood and affect.  Nursing note and vitals reviewed.   ED Course  Procedures (including critical care time) Labs Review Labs Reviewed  CBC WITH DIFFERENTIAL/PLATELET - Abnormal; Notable for the following:    WBC 12.7 (*)    Hemoglobin 11.9 (*)    Neutro Abs 9.4 (*)    All other components within normal limits  BASIC METABOLIC PANEL - Abnormal; Notable for the following:    Glucose, Bld 122 (*)    All other components within normal limits    Imaging Review Dg Chest 2 View  12/25/2015  CLINICAL DATA:  Cough.  Dyspnea.  Wheezing.  Fever. EXAM: CHEST  2  VIEW COMPARISON:  03/30/2015 chest radiograph. FINDINGS: Stable cardiomediastinal silhouette with normal heart size. No pneumothorax. No pleural effusion. There is patchy opacity in the basilar right lower lobe. No pulmonary edema. IMPRESSION: Patchy basilar right lower lobe lung opacity, which could represent a pneumonia. Recommend follow-up PA and lateral post treatment chest radiographs in 4-6 weeks. Electronically Signed   By: Ilona Sorrel M.D.   On: 12/25/2015 16:13   I have personally reviewed and evaluated these images and lab results as part of my medical decision-making.   EKG Interpretation None      Meds given in ED:  Medications  doxycycline (VIBRA-TABS) tablet 100 mg (100 mg Oral Given 12/25/15 2125)    Discharge Medication List as of 12/25/2015 11:02 PM    START  taking these medications   Details  doxycycline (VIBRAMYCIN) 100 MG capsule Take 1 capsule (100 mg total) by mouth 2 (two) times daily. One po bid x 7 days, Starting 12/25/2015, Until Discontinued, Print       Filed Vitals:   12/25/15 2115 12/25/15 2145 12/25/15 2230 12/25/15 2309  BP: 133/76 130/70 127/67   Pulse: 107 96 95   Temp:    98.7 F (37.1 C)  TempSrc:    Oral  Resp: 23 26 22    Weight:      SpO2: 96% 97% 96%      MDM  Peggy [name] is a 70 y.o. female presents for evaluation of previously diagnosed pneumonia earlier today at urgent care. Patient expressed concern for allergies related to pneumonia antibiotics. Sent here for doxycycline trial and observation. She was given doxycycline in emergent department she tolerated this treatment well. We'll discharge with outpatient doxycycline and instructions to follow-up with PCP next week for reevaluation.Low suspicion at this time for other emergent cardiopulmonary pathology such as PE. Prior to patient discharge, I discussed and reviewed this case with Dr.Knott who also saw and evaluated the patient and agrees with above plan.  Final diagnoses:  CAP  (community acquired pneumonia)        Comer Locket, PA-C 12/26/15 [id number]  Leo Grosser, MD 12/26/15 [phone]

## 2015-12-25 NOTE — ED Notes (Signed)
Pt was sent here from ucc, was diagnosed with pneumonia. Was sent here due being allergic to multiple antibiotics. No acute distress noted.

## 2015-12-25 NOTE — Discharge Instructions (Signed)
Due to antibiotic allergies associated with fever, bronchospasm and right lung pneumonia go to the ED now for management . Bronchospasm, Adult A bronchospasm is when the tubes that carry air in and out of your lungs (airways) spasm or tighten. During a bronchospasm it is hard to breathe. This is because the airways get smaller. A bronchospasm can be triggered by:  Allergies. These may be to animals, pollen, food, or mold.  Infection. This is a common cause of bronchospasm.  Exercise.  Irritants. These include pollution, cigarette smoke, strong odors, aerosol sprays, and paint fumes.  Weather changes.  Stress.  Being emotional. HOME CARE   Always have a plan for getting help. Know when to call your doctor and local emergency services (911 in the [place] Know where you can get emergency care.  Only take medicines as told by your doctor.  If you were prescribed an inhaler or nebulizer machine, ask your doctor how to use it correctly. Always use a spacer with your inhaler if you were given one.  Stay calm during an attack. Try to relax and breathe more slowly.  Control your home environment:  Change your heating and air conditioning filter at least once a month.  Limit your use of fireplaces and wood stoves.  Do not  smoke. Do not  allow smoking in your home.  Avoid perfumes and fragrances.  Get rid of pests (such as roaches and mice) and their droppings.  Throw away plants if you see mold on them.  Keep your house clean and dust free.  Replace carpet with wood, tile, or vinyl flooring. Carpet can trap dander and dust.  Use allergy-proof pillows, mattress covers, and box spring covers.  Wash bed sheets and blankets every week in hot water. Dry them in a dryer.  Use blankets that are made of polyester or cotton.  Wash hands frequently. GET HELP IF:  You have muscle aches.  You have chest pain.  The thick spit you spit or cough up (sputum) changes from clear or  white to yellow, green, gray, or bloody.  The thick spit you spit or cough up gets thicker.  There are problems that may be related to the medicine you are given such as:  A rash.  Itching.  Swelling.  Trouble breathing. GET HELP RIGHT AWAY IF:  You feel you cannot breathe or catch your breath.  You cannot stop coughing.  Your treatment is not helping you breathe better.  You have very bad chest pain. MAKE SURE YOU:   Understand these instructions.  Will watch your condition.  Will get help right away if you are not doing well or get worse.   This information is not intended to replace advice given to you by your health care provider. Make sure you discuss any questions you have with your health care provider.   Document Released: 06/18/2009 Document Revised: 09/11/2014 Document Reviewed: 02/11/2013 Elsevier Interactive Patient Education 2016 [place] Pneumonia, Adult Pneumonia is an infection of the lungs. One type of pneumonia can happen while a person is in a hospital. A different type can happen when a person is not in a hospital (community-acquired pneumonia). It is easy for this kind to spread from person to person. It can spread to you if you breathe near an infected person who coughs or sneezes. Some symptoms include:  A dry cough.  A wet (productive) cough.  Fever.  Sweating.  Chest pain. HOME CARE  Take over-the-counter and prescription medicines  only as told by your doctor.  Only take cough medicine if you are losing sleep.  If you were prescribed an antibiotic medicine, take it as told by your doctor. Do not stop taking the antibiotic even if you start to feel better.  Sleep with your head and neck raised (elevated). You can do this by putting a few pillows under your head, or you can sleep in a recliner.  Do not use tobacco products. These include cigarettes, chewing tobacco, and e-cigarettes. If you need help quitting,  ask your doctor.  Drink enough water to keep your pee (urine) clear or pale yellow. A shot (vaccine) can help prevent pneumonia. Shots are often suggested for:  People older than 72 years of age.  People older than 72 years of age:  Who are having cancer treatment.  Who have long-term (chronic) lung disease.  Who have problems with their body's defense system (immune system). You may also prevent pneumonia if you take these actions:  Get the flu (influenza) shot every year.  Go to the dentist as often as told.  Wash your hands often. If soap and water are not available, use hand sanitizer. GET HELP IF:  You have a fever.  You lose sleep because your cough medicine does not help. GET HELP RIGHT AWAY IF:  You are short of breath and it gets worse.  You have more chest pain.  Your sickness gets worse. This is very serious if:  You are an older adult.  Your body's defense system is weak.  You cough up blood.   This information is not intended to replace advice given to you by your health care provider. Make sure you discuss any questions you have with your health care provider.   Document Released: 02/07/2008 Document Revised: 05/12/2015 Document Reviewed: 12/16/2014 Elsevier Interactive Patient Education [employer].

## 2015-12-25 NOTE — Discharge Instructions (Signed)
You were diagnosed with pneumonia. You'll be treated for this with antibiotics. Follow-up with your doctor in the next 2-3 days for reevaluation. Return to ED for new or worsening symptoms as we discussed.  Community-Acquired Pneumonia, Adult Pneumonia is an infection of the lungs. One type of pneumonia can happen while a person is in a hospital. A different type can happen when a person is not in a hospital (community-acquired pneumonia). It is easy for this kind to spread from person to person. It can spread to you if you breathe near an infected person who coughs or sneezes. Some symptoms include:  A dry cough.  A wet (productive) cough.  Fever.  Sweating.  Chest pain. HOME CARE  Take over-the-counter and prescription medicines only as told by your doctor.  Only take cough medicine if you are losing sleep.  If you were prescribed an antibiotic medicine, take it as told by your doctor. Do not stop taking the antibiotic even if you start to feel better.  Sleep with your head and neck raised (elevated). You can do this by putting a few pillows under your head, or you can sleep in a recliner.  Do not use tobacco products. These include cigarettes, chewing tobacco, and e-cigarettes. If you need help quitting, ask your doctor.  Drink enough water to keep your pee (urine) clear or pale yellow. A shot (vaccine) can help prevent pneumonia. Shots are often suggested for:  People older than 72 years of age.  People older than 72 years of age:  Who are having cancer treatment.  Who have long-term (chronic) lung disease.  Who have problems with their body's defense system (immune system). You may also prevent pneumonia if you take these actions:  Get the flu (influenza) shot every year.  Go to the dentist as often as told.  Wash your hands often. If soap and water are not available, use hand sanitizer. GET HELP IF:  You have a fever.  You lose sleep because your cough medicine  does not help. GET HELP RIGHT AWAY IF:  You are short of breath and it gets worse.  You have more chest pain.  Your sickness gets worse. This is very serious if:  You are an older adult.  Your body's defense system is weak.  You cough up blood.   This information is not intended to replace advice given to you by your health care provider. Make sure you discuss any questions you have with your health care provider.   Document Released: 02/07/2008 Document Revised: 05/12/2015 Document Reviewed: 12/16/2014 Elsevier Interactive Patient Education [employer].

## 2016-02-07 ENCOUNTER — Other Ambulatory Visit (HOSPITAL_COMMUNITY): Payer: Self-pay | Admitting: Family Medicine

## 2016-02-07 DIAGNOSIS — M25561 Pain in right knee: Secondary | ICD-10-CM

## 2016-02-07 DIAGNOSIS — Z96653 Presence of artificial knee joint, bilateral: Secondary | ICD-10-CM

## 2016-02-07 DIAGNOSIS — M25562 Pain in left knee: Secondary | ICD-10-CM

## 2016-02-14 ENCOUNTER — Encounter (HOSPITAL_COMMUNITY)
Admission: RE | Admit: 2016-02-14 | Discharge: 2016-02-14 | Disposition: A | Discharge status: Home / Self Care | Payer: Medicare Other | Source: Ambulatory Visit | Attending: Family Medicine | Admitting: Family Medicine

## 2016-02-14 DIAGNOSIS — Z96653 Presence of artificial knee joint, bilateral: Secondary | ICD-10-CM | POA: Diagnosis present

## 2016-02-14 DIAGNOSIS — M25562 Pain in left knee: Secondary | ICD-10-CM | POA: Diagnosis present

## 2016-02-14 DIAGNOSIS — M25561 Pain in right knee: Secondary | ICD-10-CM | POA: Diagnosis present

## 2016-02-14 MED ORDER — TECHNETIUM TC 99M MEDRONATE IV KIT
25.4000 | PACK | Freq: Once | INTRAVENOUS | Status: AC | PRN
  Administered 2016-02-14: 25.4 via INTRAVENOUS

## 2016-05-31 MED FILL — valACYclovir HCL 1 GM TABS: 1 | 60 days supply | Qty: 30 | Fill #0

## 2016-05-31 MED FILL — diazePAM 5 MG TABS: 5 | 8 days supply | Qty: 30 | Fill #0

## 2016-05-31 MED FILL — DIVALPROEX SOD ER 500 MG TA: 500 | 30 days supply | Qty: 60 | Fill #0

## 2016-05-31 MED FILL — FLUoxetine HCL 40 MG CAPS: 40 | 30 days supply | Qty: 30 | Fill #0

## 2016-06-02 ENCOUNTER — Other Ambulatory Visit: Payer: Self-pay | Admitting: Internal Medicine

## 2016-06-02 DIAGNOSIS — Z1231 Encounter for screening mammogram for malignant neoplasm of breast: Secondary | ICD-10-CM

## 2016-06-16 MED FILL — SIMVASTATIN 80 MG TABLET: 80 | 90 days supply | Qty: 45 | Fill #0

## 2016-06-16 MED FILL — TRANDOLAPRIL 2 MG TABLET: 2 | 30 days supply | Qty: 30 | Fill #0

## 2016-07-03 MED FILL — DIVALPROEX SOD ER 500 MG TA: 500 | 30 days supply | Qty: 60 | Fill #1

## 2016-07-03 MED FILL — FLUoxetine HCL 40 MG CAPS: 40 | 30 days supply | Qty: 30 | Fill #1

## 2016-07-03 MED FILL — diazePAM 5 MG TABS: 5 | 8 days supply | Qty: 30 | Fill #1

## 2016-07-10 ENCOUNTER — Ambulatory Visit: Payer: Medicare Other

## 2016-08-01 MED FILL — DIVALPROEX SOD ER 500 MG TA: 500 | 30 days supply | Qty: 60 | Fill #2

## 2016-08-01 MED FILL — valACYclovir HCL 1 GM TABS: 1 | 60 days supply | Qty: 30 | Fill #1

## 2016-08-01 MED FILL — FLUoxetine HCL 40 MG CAPS: 40 | 30 days supply | Qty: 30 | Fill #2

## 2016-08-01 MED FILL — TRANDOLAPRIL 2 MG TABLET: 2 | 30 days supply | Qty: 30 | Fill #1

## 2016-08-02 MED FILL — diazePAM 5 MG TABS: 5 | 8 days supply | Qty: 30 | Fill #0

## 2016-09-05 MED FILL — DIVALPROEX SOD ER 500 MG TA: 500 | 30 days supply | Qty: 60 | Fill #0

## 2016-09-05 MED FILL — FLUoxetine HCL 40 MG CAPS: 40 | 30 days supply | Qty: 30 | Fill #3

## 2016-09-05 MED FILL — TRANDOLAPRIL 2 MG TABLET: 2 | 30 days supply | Qty: 30 | Fill #2

## 2016-09-28 MED FILL — SIMVASTATIN 80 MG TABLET: 80 | 90 days supply | Qty: 45 | Fill #1

## 2016-10-05 MED FILL — DIVALPROEX SOD ER 500 MG TA: 500 | 30 days supply | Qty: 60 | Fill #1

## 2016-10-05 MED FILL — FLUoxetine HCL 40 MG CAPS: 40 | 30 days supply | Qty: 30 | Fill #4

## 2016-10-05 MED FILL — diazePAM 5 MG TABS: 5 | 8 days supply | Qty: 30 | Fill #1

## 2016-10-06 MED FILL — TRANDOLAPRIL 2 MG TABLET: 2 | 30 days supply | Qty: 30 | Fill #0

## 2016-11-01 MED FILL — FLUoxetine HCL 40 MG CAPS: 40 | 30 days supply | Qty: 30 | Fill #0

## 2016-11-01 MED FILL — TRANDOLAPRIL 2 MG TABLET: 2 | 30 days supply | Qty: 30 | Fill #1

## 2016-11-01 MED FILL — DIVALPROEX SOD ER 500 MG TA: 500 | 30 days supply | Qty: 60 | Fill #2

## 2016-11-17 MED FILL — valACYclovir HCL 1 GM TABS: 1 | 30 days supply | Qty: 45 | Fill #0

## 2016-11-17 MED FILL — VENTOLIN HFA 90 MCG INHALER: 108 (90 BAS | 25 days supply | Qty: 18 | Fill #0

## 2016-11-28 MED FILL — ACETAMINOPHEN/COD #3 TABLET: 300-30 | 15 days supply | Qty: 30 | Fill #0

## 2016-12-04 MED FILL — FLUoxetine HCL 40 MG CAPS: 40 | 30 days supply | Qty: 30 | Fill #1

## 2016-12-04 MED FILL — DIVALPROEX SOD ER 500 MG TA: 500 | 30 days supply | Qty: 60 | Fill #3

## 2016-12-04 MED FILL — TRANDOLAPRIL 2 MG TAB: 2 | 30 days supply | Qty: 30 | Fill #0

## 2017-01-03 MED FILL — DIVALPROEX SOD ER 500 MG TA: 500 | 30 days supply | Qty: 60 | Fill #4

## 2017-01-03 MED FILL — TRANDOLAPRIL 2 MG TAB: 2 | 30 days supply | Qty: 30 | Fill #1

## 2017-01-03 MED FILL — FLUoxetine HCL 40 MG CAPS: 40 | 30 days supply | Qty: 30 | Fill #2

## 2017-01-03 MED FILL — valACYclovir HCL 1 GM TABS: 1 | 30 days supply | Qty: 45 | Fill #1

## 2017-01-03 MED FILL — SIMVASTATIN 80 MG TABLET: 80 | 90 days supply | Qty: 45 | Fill #2

## 2017-01-03 MED FILL — diazePAM 5 MG TABS: 5 | 8 days supply | Qty: 30 | Fill #2

## 2017-01-23 ENCOUNTER — Ambulatory Visit
Admission: RE | Admit: 2017-01-23 | Discharge: 2017-01-23 | Disposition: A | Discharge status: Home / Self Care | Payer: Medicare Other | Source: Ambulatory Visit | Attending: Nurse Practitioner | Admitting: Nurse Practitioner

## 2017-01-23 ENCOUNTER — Other Ambulatory Visit: Payer: Self-pay | Admitting: Nurse Practitioner

## 2017-01-23 DIAGNOSIS — R062 Wheezing: Secondary | ICD-10-CM

## 2017-01-23 DIAGNOSIS — R05 Cough: Secondary | ICD-10-CM

## 2017-01-23 DIAGNOSIS — R059 Cough, unspecified: Secondary | ICD-10-CM

## 2017-02-01 MED FILL — FLUoxetine HCL 40 MG CAPS: 40 | 30 days supply | Qty: 30 | Fill #3

## 2017-02-01 MED FILL — TRANDOLAPRIL 2 MG TAB: 2 | 30 days supply | Qty: 30 | Fill #2

## 2017-02-01 MED FILL — DIVALPROEX SOD ER 500 MG TA: 500 | 30 days supply | Qty: 60 | Fill #5

## 2017-02-05 ENCOUNTER — Encounter: Payer: Self-pay | Admitting: Podiatry

## 2017-02-05 ENCOUNTER — Ambulatory Visit (INDEPENDENT_AMBULATORY_CARE_PROVIDER_SITE_OTHER): Payer: Medicare Other | Admitting: Podiatry

## 2017-02-05 VITALS — BP 131/71 | HR 92 | Ht 64.0 in | Wt 205.0 lb

## 2017-02-05 DIAGNOSIS — S99911A Unspecified injury of right ankle, initial encounter: Secondary | ICD-10-CM

## 2017-02-05 DIAGNOSIS — M659 Synovitis and tenosynovitis, unspecified: Secondary | ICD-10-CM | POA: Diagnosis not present

## 2017-02-05 DIAGNOSIS — M216X1 Other acquired deformities of right foot: Secondary | ICD-10-CM | POA: Diagnosis not present

## 2017-02-05 NOTE — Patient Instructions (Signed)
Seen for injured left ankle. No acute changes noted in X-ray. Left foot reveal multiple heel spur with elevated first metatarsal with flat arch. May use ankle brace as needed. Return if pain continues.

## 2017-02-05 NOTE — Progress Notes (Signed)
SUBJECTIVE: 71 y.o. year old female presents stating that she twisted left ankle the other day and is hurting to walk. Patient points medial malleoli area being the source of pain.  Patient also request nails and calluses trimmed. Patient uses cane due to back and knee problem.   REVIEW OF SYSTEMS: A comprehensive review of systems was negative except for: Has had bleeding episode 2 years ago and was hospitalized. Had 2 back surgeries many years ago and the last surgery was in 2002. Positive history of bilateral knee surgery.   OBJECTIVE: DERMATOLOGIC EXAMINATION: Nails: mild hypertrophic nails x 10. Mild plantar ball callus right foot.   VASCULAR EXAMINATION OF LOWER LIMBS: All pedal pulses are palpable with normal pulsation.  Capillary Filling times within 3 seconds in all digits.  No visible edema or erythema noted. Temperature gradient from tibial crest to dorsum of foot is within normal bilateral.  NEUROLOGIC EXAMINATION OF THE LOWER LIMBS: All epicritic and tactile sensations grossly intact. Sharp and Dull discriminatory sensations at the plantar ball of hallux is intact bilateral.  Excess STJ hyperpronation bilateral.  MUSCULOSKELETAL EXAMINATION: Positive of forefoot varus, elevated first ray bilateral, ankle equinus bilateral, flexible flat foot bilateral.  RADIOGRAPHIC STUDY OF THE RIGHT FOOT AND ANKE:  Flattening mid foot with low calcaneal pitch. Elevated first ray right. AP and lateral of ankle view is normal.  No acute changes noted.   ASSESSMENT: Tenosynovitis right ankle. Injury right ankle. STJ hyperpronation bilateral. Ankle equinus bilateral.   PLAN: Reviewed clinical findings and available treatment options. Reviewed proper shoe gear. Ankle brace placed in right ankle. Patient is to use the brace till pain subside.

## 2017-02-15 ENCOUNTER — Other Ambulatory Visit: Payer: Self-pay | Admitting: Gastroenterology

## 2017-02-15 DIAGNOSIS — R110 Nausea: Secondary | ICD-10-CM

## 2017-02-15 DIAGNOSIS — R1011 Right upper quadrant pain: Secondary | ICD-10-CM

## 2017-02-22 ENCOUNTER — Encounter (HOSPITAL_COMMUNITY)
Admission: RE | Admit: 2017-02-22 | Discharge: 2017-02-22 | Disposition: A | Discharge status: Home / Self Care | Payer: Medicare Other | Source: Ambulatory Visit | Attending: Gastroenterology | Admitting: Gastroenterology

## 2017-02-22 ENCOUNTER — Ambulatory Visit (HOSPITAL_COMMUNITY)
Admission: RE | Admit: 2017-02-22 | Discharge: 2017-02-22 | Disposition: A | Discharge status: Home / Self Care | Payer: Medicare Other | Source: Ambulatory Visit | Attending: Gastroenterology | Admitting: Gastroenterology

## 2017-02-22 ENCOUNTER — Encounter (HOSPITAL_COMMUNITY): Payer: Self-pay

## 2017-02-22 DIAGNOSIS — R110 Nausea: Secondary | ICD-10-CM

## 2017-02-22 DIAGNOSIS — K7689 Other specified diseases of liver: Secondary | ICD-10-CM | POA: Insufficient documentation

## 2017-02-22 DIAGNOSIS — N281 Cyst of kidney, acquired: Secondary | ICD-10-CM | POA: Diagnosis not present

## 2017-02-22 DIAGNOSIS — E7800 Pure hypercholesterolemia, unspecified: Secondary | ICD-10-CM | POA: Diagnosis not present

## 2017-02-22 DIAGNOSIS — R1011 Right upper quadrant pain: Secondary | ICD-10-CM | POA: Insufficient documentation

## 2017-03-05 MED FILL — FLUoxetine HCL 40 MG CAPS: 40 | 30 days supply | Qty: 30 | Fill #4

## 2017-03-05 MED FILL — DIVALPROEX SOD ER 500 MG TA: 500 | 30 days supply | Qty: 60 | Fill #0

## 2017-03-05 MED FILL — diazePAM 5 MG TABS: 5 | 8 days supply | Qty: 30 | Fill #0

## 2017-03-08 MED FILL — TRANDOLAPRIL 2 MG TAB: 2 | 30 days supply | Qty: 30 | Fill #0

## 2017-04-05 MED FILL — diazePAM 5 MG TABS: 5 | 8 days supply | Qty: 30 | Fill #1

## 2017-04-05 MED FILL — TRANDOLAPRIL 2 MG TAB: 2 | 30 days supply | Qty: 30 | Fill #1

## 2017-04-05 MED FILL — DIVALPROEX SOD ER 500 MG TA: 500 | 30 days supply | Qty: 60 | Fill #1

## 2017-04-05 MED FILL — FLUoxetine HCL 40 MG CAPS: 40 | 30 days supply | Qty: 30 | Fill #5

## 2017-05-04 MED FILL — diazePAM 5 MG TABS: 5 | 8 days supply | Qty: 30 | Fill #2

## 2017-05-04 MED FILL — DIVALPROEX SOD ER 500 MG TA: 500 | 30 days supply | Qty: 60 | Fill #2

## 2017-05-04 MED FILL — FLUoxetine HCL 40 MG CAPS: 40 | 30 days supply | Qty: 30 | Fill #0

## 2017-05-04 MED FILL — valACYclovir HCL 1 GM TABS: 1 | 30 days supply | Qty: 45 | Fill #2

## 2017-05-04 MED FILL — TRANDOLAPRIL 2 MG TAB: 2 | 30 days supply | Qty: 30 | Fill #2

## 2017-05-21 MED FILL — predniSONE 10 MG TABS: 10 | 6 days supply | Qty: 21 | Fill #0

## 2017-05-21 MED FILL — traMADol HCL 50 MG TABS: 50 | 5 days supply | Qty: 20 | Fill #0

## 2017-05-21 MED FILL — CIPROFLOXACIN HCL 500 MG TA: 500 | 8 days supply | Qty: 16 | Fill #0

## 2017-05-29 MED FILL — valACYclovir HCL 1 GM TABS: 1 | 90 days supply | Qty: 90 | Fill #0

## 2017-05-29 MED FILL — TRANDOLAPRIL 2 MG TAB: 2 | 90 days supply | Qty: 90 | Fill #0

## 2017-06-04 MED FILL — FLUoxetine HCL 40 MG CAPS: 40 | 30 days supply | Qty: 30 | Fill #1

## 2017-06-04 MED FILL — DIVALPROEX SOD ER 500 MG TA: 500 | 30 days supply | Qty: 60 | Fill #3

## 2017-06-19 MED FILL — SIMVASTATIN 80 MG TABLET: 80 | 90 days supply | Qty: 45 | Fill #0

## 2017-07-05 MED FILL — FLUoxetine HCL 40 MG CAPS: 40 | 30 days supply | Qty: 30 | Fill #2

## 2017-07-05 MED FILL — DIVALPROEX SOD ER 500 MG TA: 500 | 30 days supply | Qty: 60 | Fill #4

## 2017-07-10 DIAGNOSIS — F411 Generalized anxiety disorder: Secondary | ICD-10-CM | POA: Diagnosis not present

## 2017-07-10 DIAGNOSIS — F330 Major depressive disorder, recurrent, mild: Secondary | ICD-10-CM | POA: Diagnosis not present

## 2017-08-02 MED FILL — diazePAM 5 MG TABS: 5 | 8 days supply | Qty: 30 | Fill #0

## 2017-08-02 MED FILL — DIVALPROEX SOD ER 500 MG TA: 500 | 30 days supply | Qty: 60 | Fill #5

## 2017-08-02 MED FILL — FLUoxetine HCL 40 MG CAPS: 40 | 30 days supply | Qty: 30 | Fill #3

## 2017-08-30 DIAGNOSIS — I10 Essential (primary) hypertension: Secondary | ICD-10-CM | POA: Diagnosis not present

## 2017-08-30 DIAGNOSIS — Z1389 Encounter for screening for other disorder: Secondary | ICD-10-CM | POA: Diagnosis not present

## 2017-08-30 MED FILL — DIVALPROEX SOD ER 500 MG TA: 500 | 30 days supply | Qty: 60 | Fill #0

## 2017-08-30 MED FILL — TRANDOLAPRIL 2 MG TAB: 2 | 90 days supply | Qty: 90 | Fill #1

## 2017-08-30 MED FILL — traMADol HCL 50 MG TABS: 50 | 7 days supply | Qty: 20 | Fill #0

## 2017-08-30 MED FILL — VENTOLIN HFA 90 MCG INHALER: 108 (90 BAS | 16 days supply | Qty: 18 | Fill #0

## 2017-08-30 MED FILL — FLUoxetine HCL 40 MG CAPS: 40 | 30 days supply | Qty: 30 | Fill #4

## 2017-08-30 MED FILL — diazePAM 5 MG TABS: 5 | 8 days supply | Qty: 30 | Fill #1

## 2017-08-30 MED FILL — valACYclovir HCL 1 GM TABS: 1 | 90 days supply | Qty: 90 | Fill #1

## 2017-09-04 ENCOUNTER — Emergency Department (HOSPITAL_COMMUNITY)
Admission: EM | Admit: 2017-09-04 | Discharge: 2017-09-04 | Disposition: A | Discharge status: Home / Self Care | Payer: Medicare HMO | Attending: Emergency Medicine | Admitting: Emergency Medicine

## 2017-09-04 ENCOUNTER — Encounter (HOSPITAL_COMMUNITY): Payer: Self-pay

## 2017-09-04 ENCOUNTER — Emergency Department (HOSPITAL_COMMUNITY): Payer: Medicare HMO

## 2017-09-04 DIAGNOSIS — I10 Essential (primary) hypertension: Secondary | ICD-10-CM | POA: Diagnosis not present

## 2017-09-04 DIAGNOSIS — J45909 Unspecified asthma, uncomplicated: Secondary | ICD-10-CM | POA: Insufficient documentation

## 2017-09-04 DIAGNOSIS — R079 Chest pain, unspecified: Secondary | ICD-10-CM

## 2017-09-04 DIAGNOSIS — Z7982 Long term (current) use of aspirin: Secondary | ICD-10-CM | POA: Insufficient documentation

## 2017-09-04 DIAGNOSIS — R0789 Other chest pain: Secondary | ICD-10-CM | POA: Diagnosis not present

## 2017-09-04 DIAGNOSIS — R202 Paresthesia of skin: Secondary | ICD-10-CM | POA: Diagnosis not present

## 2017-09-04 DIAGNOSIS — R200 Anesthesia of skin: Secondary | ICD-10-CM | POA: Insufficient documentation

## 2017-09-04 DIAGNOSIS — M79602 Pain in left arm: Secondary | ICD-10-CM | POA: Diagnosis not present

## 2017-09-04 DIAGNOSIS — M25512 Pain in left shoulder: Secondary | ICD-10-CM | POA: Diagnosis not present

## 2017-09-04 DIAGNOSIS — Z85828 Personal history of other malignant neoplasm of skin: Secondary | ICD-10-CM | POA: Insufficient documentation

## 2017-09-04 DIAGNOSIS — Z96653 Presence of artificial knee joint, bilateral: Secondary | ICD-10-CM | POA: Insufficient documentation

## 2017-09-04 LAB — BASIC METABOLIC PANEL
ANION GAP: 7 (ref 5–15)
BUN: 10 mg/dL (ref 6–20)
CO2: 25 mmol/L (ref 22–32)
CREATININE: 1.01 mg/dL — AB (ref 0.44–1.00)
Calcium: 9 mg/dL (ref 8.9–10.3)
Chloride: 108 mmol/L (ref 101–111)
GFR calc Af Amer: >60 mL/min (ref >60)
GFR calc non Af Amer: 54 mL/min — ABNORMAL LOW (ref >60)
GLUCOSE: 89 mg/dL (ref 65–99)
Potassium: 3.9 mmol/L (ref 3.5–5.1)
Sodium: 140 mmol/L (ref 135–145)

## 2017-09-04 LAB — CBC
HCT: 32.6 % — ABNORMAL LOW (ref 36.0–46.0)
HEMOGLOBIN: 9.7 g/dL — AB (ref 12.0–15.0)
MCH: 24.6 pg — AB (ref 26.0–34.0)
MCHC: 29.8 g/dL — AB (ref 30.0–36.0)
MCV: 82.5 fL (ref 78.0–100.0)
Platelets: 371 10*3/uL (ref 150–400)
RBC: 3.95 MIL/uL (ref 3.87–5.11)
RDW: 17.6 % — AB (ref 11.5–15.5)
WBC: 9.7 10*3/uL (ref 4.0–10.5)

## 2017-09-04 LAB — I-STAT TROPONIN, ED: Troponin i, poc: 0 ng/mL (ref 0.00–0.08)

## 2017-09-04 NOTE — Discharge Instructions (Signed)
We have advised that you stay for work up . Please return to the ER at anytime if your symptoms worsen or do not go away.  Your caregiver has diagnosed you as having chest pain that is not specific for one problem, but does not require admission.  You are at low risk for an acute heart condition or other serious illness. Chest pain comes from many different causes.  SEEK IMMEDIATE MEDICAL ATTENTION IF: You have severe chest pain, especially if the pain is crushing or pressure-like and spreads to the arms, back, neck, or jaw, or if you have sweating, nausea (feeling sick to your stomach), or shortness of breath. THIS IS AN EMERGENCY. Don't wait to see if the pain will go away. Get medical help at once. Call 911 or 0 (operator). DO NOT drive yourself to the hospital.  Your chest pain gets worse and does not go away with rest.  You have an attack of chest pain lasting longer than usual, despite rest and treatment with the medications your caregiver has prescribed.  You wake from sleep with chest pain or shortness of breath.  You feel dizzy or faint.  You have chest pain not typical of your usual pain for which you originally saw your caregiver.

## 2017-09-04 NOTE — ED Triage Notes (Signed)
Onset last night around 8pm left chest pain, feels like cramp, constant.  Pt got short of breath, more than her usual shortness of breath.  Talking in complete sentences, breathing unlabored.  Prior to onset of chest pain pt reports dizziness.  Pt was unable to lay flat or on sides d/t chest pain, sat up in chair all night.  No nausea, dizziness, or diaphoresis at this time.

## 2017-09-04 NOTE — ED Provider Notes (Signed)
[facility] EMERGENCY DEPARTMENT Provider Note   CSN: [id number] Arrival date & time: 09/04/17  1115     History   Chief Complaint Chief Complaint  Patient presents with  . Chest Pain    HPI Peggy [name] is a 72 y.o. female.  Who presents the emergency department chief complaint of left-sided chest pain.  Patient states that around 8 PM last night she had onset of a cramping pain on the left side of her chest.  She states that this occurred while she is watching television.  She had associated pain in the left shoulder blade and pain down the left arm with associated numbness and tingling.  She states that last about 20 minutes and then went away.  She has had persistent left-sided chest pain since that time.  She denies shortness of breath, nausea, vomiting, diaphoresis.  She has no known cardiac disease.  She has a history of hypertension hyperlipidemia.  She denies a history of smoking, or diabetes.  Patient states that she does not have pain at this time.  She did not take any medications prior to arrival.  HPI  Past Medical History:  Diagnosis Date  . Anxiety   . Arthritis    "all over"  . Asthma   . Bladder cancer ([place])   . Chronic bronchitis ([place])    'get it q yr"  . Chronic lower back pain   . Daily headache   . Deafness in left ear   . Depression   . DVT (deep venous thrombosis) ([place]) 2009/2010   "probably left leg"   . GERD (gastroesophageal reflux disease)   . History of kidney stones   . Hypercholesteremia   . Hypertension   . Nummular eczema   . Skin cancer    "right abdomen"    Patient Active Problem List   Diagnosis Date Noted  . Anemia, iron deficiency 02/20/2015  . Musculoskeletal chest pain 02/20/2015  . Chest pain at rest 02/18/2015  . Hyperlipidemia 02/18/2015  . Sciatica 12/10/2012  . Back pain, lumbosacral 12/10/2012  . Hypertension 12/10/2012  . Spinal stenosis 12/10/2012  . DDD (degenerative disc disease), lumbar  12/10/2012    Past Surgical History:  Procedure Laterality Date  . ABDOMINAL HYSTERECTOMY  1980's  . APPENDECTOMY Right   . BACK SURGERY     x 2, lower  . BLADDER TUMOR EXCISION    . BREAST BIOPSY Right   . BREAST LUMPECTOMY    . COLONOSCOPY WITH PROPOFOL N/A 02/26/2015   Procedure: COLONOSCOPY WITH PROPOFOL;  Surgeon: Carol Ada, MD;  Location: [facility];  Service: Endoscopy;  Laterality: N/A;  . ESOPHAGOGASTRODUODENOSCOPY Left 02/20/2015   Procedure: ESOPHAGOGASTRODUODENOSCOPY (EGD);  Surgeon: Milus Banister, MD;  Location: [facility];  Service: Endoscopy;  Laterality: Left;  . JOINT REPLACEMENT    . KNEE ARTHROSCOPY Right ~ 2000  . LUMBAR DISC SURGERY  1980's X 1; ~ 2002  . MEDIAL PARTIAL KNEE REPLACEMENT Right 2001  . PARTIAL KNEE ARTHROPLASTY Left 2009  . REPLACEMENT TOTAL KNEE BILATERAL Bilateral   . REVISION TOTAL KNEE ARTHROPLASTY Left   . SKIN CANCER EXCISION Right    "abdomen"    OB History    No data available       Home Medications    Prior to Admission medications   Medication Sig Start Date End Date Taking? Authorizing Provider  aspirin 325 MG tablet Take 325-650 mg by mouth daily as needed for moderate pain or fever (chest  pain.).     [provider]  divalproex (DEPAKOTE ER) 500 MG 24 hr tablet Take 1,000 mg by mouth at bedtime.     [provider]  doxycycline (VIBRAMYCIN) 100 MG capsule Take 1 capsule (100 mg total) by mouth 2 (two) times daily. One po bid x 7 days 12/25/15   Comer Locket, PA-C  feeding supplement, ENSURE ENLIVE, (ENSURE ENLIVE) LIQD Take 237 mLs by mouth 2 (two) times daily between meals. Patient taking differently: Take 237 mLs by mouth 2 (two) times daily as needed (uses when not eating as much).  02/20/15   Dhungel, Flonnie Overman, MD  ferrous sulfate 325 (65 FE) MG tablet Take 1 tablet (325 mg total) by mouth 3 (three) times daily with meals. 02/20/15   Dhungel, Flonnie Overman, MD  lisinopril-hydrochlorothiazide  (PRINZIDE,ZESTORETIC) 20-12.5 MG tablet Take 1 tablet by mouth daily.    [provider]  simvastatin (ZOCOR) 80 MG tablet Take 80 mg by mouth at bedtime.    [provider]  valACYclovir (VALTREX) 1000 MG tablet Take 1 g by mouth 2 (two) times daily as needed (for shingles).  02/04/15   [provider]    Family History Family History  Problem Relation Age of Onset  . Heart failure Mother   . Cancer Mother   . Hypertension Mother   . Hypertension Father     Social History Social History   Tobacco Use  . Smoking status: Never Smoker  . Smokeless tobacco: Never Used  Substance Use Topics  . Alcohol use: No  . Drug use: No     Allergies   Azithromycin; Levaquin [levofloxacin in d5w]; Penicillins; Clindamycin/lincomycin; Erythromycin base; Keflex [cephalexin]; Sulfa antibiotics; and Tetracyclines & related   Review of Systems Review of Systems  Ten systems reviewed and are negative for acute change, except as noted in the HPI.   Physical Exam Updated Vital Signs BP (!) 141/65   Pulse 78   Temp 98.4 F (36.9 C) (Oral)   Resp (!) 21   SpO2 100%   Physical Exam  Constitutional: She is oriented to person, place, and time. She appears well-developed and well-nourished. No distress.  HENT:  Head: Normocephalic and atraumatic.  Eyes: Conjunctivae are normal. No scleral icterus.  Neck: Normal range of motion.  Cardiovascular: Normal rate, regular rhythm, normal heart sounds, intact distal pulses and normal pulses. Exam reveals no gallop and no friction rub.  No murmur heard. Pulmonary/Chest: Effort normal and breath sounds normal. No respiratory distress.  Abdominal: Soft. Bowel sounds are normal. She exhibits no distension and no mass. There is no tenderness. There is no guarding.  Neurological: She is alert and oriented to person, place, and time.  Skin: Skin is warm and dry. She is not diaphoretic.  Psychiatric: Her behavior is normal.    Nursing note and vitals reviewed.    ED Treatments / Results  Labs (all labs ordered are listed, but only abnormal results are displayed) Labs Reviewed  BASIC METABOLIC PANEL - Abnormal; Notable for the following components:      Result Value   Creatinine, Ser 1.01 (*)    GFR calc non Af Amer 54 (*)    All other components within normal limits  CBC - Abnormal; Notable for the following components:   Hemoglobin 9.7 (*)    HCT 32.6 (*)    MCH 24.6 (*)    MCHC 29.8 (*)    RDW 17.6 (*)    All other components within normal limits  I-STAT TROPONIN, ED    EKG  EKG Interpretation  Date/Time:  Tuesday September 04 2017 17:53:50 EST Ventricular Rate:  78 PR Interval:    QRS Duration: 88 QT Interval:  379 QTC Calculation: 432 R Axis:   47 Text Interpretation:  Sinus rhythm Atrial premature complex Low voltage, precordial leads Borderline T abnormalities, anterior leads since last tracing no significant change Confirmed by Noemi Chapel [phone]) on 09/04/2017 6:07:32 PM       Radiology Dg Chest 2 View  Result Date: 09/04/2017 CLINICAL DATA:  Chest pain EXAM: CHEST  2 VIEW COMPARISON:  01/23/2017 FINDINGS: Cardiac shadow is mildly enlarged but stable. The lungs are clear bilaterally. Degenerative changes of the thoracic spine are noted. IMPRESSION: No active cardiopulmonary disease. Electronically Signed   By: Inez Catalina M.D.   On: 09/04/2017 12:15    Procedures Procedures (including critical care time)  Medications Ordered in ED Medications - No data to display   Initial Impression / Assessment and Plan / ED Course  I have reviewed the triage vital signs and the nursing notes.  Pertinent labs & imaging results that were available during my care of the patient were reviewed by me and considered in my medical decision making (see chart for details).  Clinical Course as of Sep 05 1831  Tue Sep 04, 2017  1806 Hemoglobin: Marland Kitchen 9.7 [AH]    Clinical Course User Index [AH]  Margarita Mail, PA-C    Patient without abnormal vital signs.  Patient declines any further workup.  We advised the patient that although she is feeling improved she does have some risk factors which would at least warrant a second troponin if not admission for cardiac workup however patient declines at this time.  She does appear stable and her risk factors are low to moderate.  We have advised the patient that should she should return to the emergency department immediately should she have any worsening or changes in her symptoms.  The patient appears competent to make this decision.  She appears safe for discharge at this time with strong return precautions  Final Clinical Impressions(s) / ED Diagnoses   Final diagnoses:  None    ED Discharge Orders    None       Margarita Mail, PA-C 09/04/17 1843    Noemi Chapel, MD 09/10/17 [phone]

## 2017-09-04 NOTE — ED Provider Notes (Signed)
  The patient is a 72 year old female, known history of hypertension, hypercholesterolemia, she does have a family history of heart disease though it is been in older years.  She presents to the hospital with a complaint of chest discomfort, there was some radiation and some abnormal feelings in the arm, she states that on my exam she has absolutely no symptoms, she has no numbness or weakness no chest pain or shortness of breath and feels back to normal.  She reports having a normal heart catheterization in the past, she has not followed up with a cardiologist in many years.  On exam the patient has clear heart and lung sounds without murmurs rubs gallops wheezing rhonchi or rales, soft abdomen, no edema and is well-appearing.  Her EKG is unremarkable as is her lab work and she requests discharge home.  I have had a discussion with the patient at length that given the length of time she had symptoms and her risk factors that she would benefit from an observational admission but she declines although she does agree to come back should her symptoms worsen.  I have tried to encourage her to stay but she has refused, at this time the patient will go home and come back if her symptoms worsen.   EKG Interpretation  Date/Time:  Tuesday September 04 2017 17:53:50 EST Ventricular Rate:  78 PR Interval:    QRS Duration: 88 QT Interval:  379 QTC Calculation: 432 R Axis:   47 Text Interpretation:  Sinus rhythm Atrial premature complex Low voltage, precordial leads Borderline T abnormalities, anterior leads since last tracing no significant change Confirmed by Noemi Chapel [phone]) on 09/04/2017 6:07:32 PM       Medical screening examination/treatment/procedure(s) were conducted as a shared visit with non-physician practitioner(s) and myself.  I personally evaluated the patient during the encounter.  Clinical Impression:   Final diagnoses:  None         Noemi Chapel, MD 09/10/17 [phone]

## 2017-09-07 DIAGNOSIS — R0600 Dyspnea, unspecified: Secondary | ICD-10-CM | POA: Diagnosis not present

## 2017-09-07 DIAGNOSIS — D649 Anemia, unspecified: Secondary | ICD-10-CM | POA: Diagnosis not present

## 2017-09-07 DIAGNOSIS — Z09 Encounter for follow-up examination after completed treatment for conditions other than malignant neoplasm: Secondary | ICD-10-CM | POA: Diagnosis not present

## 2017-09-07 DIAGNOSIS — R079 Chest pain, unspecified: Secondary | ICD-10-CM | POA: Diagnosis not present

## 2017-09-07 MED FILL — SIMVASTATIN 80 MG TABLET: 80 | 90 days supply | Qty: 90 | Fill #0

## 2017-10-01 MED FILL — FLUoxetine HCL 40 MG CAPS: 40 | 30 days supply | Qty: 30 | Fill #5

## 2017-10-01 MED FILL — DIVALPROEX SOD ER 500 MG TA: 500 | 30 days supply | Qty: 60 | Fill #1

## 2017-10-05 HISTORY — PX: OTHER SURGICAL HISTORY: SHX169

## 2017-10-11 ENCOUNTER — Encounter: Payer: Self-pay | Admitting: Cardiology

## 2017-10-11 ENCOUNTER — Ambulatory Visit (INDEPENDENT_AMBULATORY_CARE_PROVIDER_SITE_OTHER): Payer: Medicare HMO | Admitting: Cardiology

## 2017-10-11 VITALS — BP 130/62 | HR 88 | Ht 64.5 in | Wt 214.0 lb

## 2017-10-11 DIAGNOSIS — E785 Hyperlipidemia, unspecified: Secondary | ICD-10-CM | POA: Diagnosis not present

## 2017-10-11 DIAGNOSIS — R0989 Other specified symptoms and signs involving the circulatory and respiratory systems: Secondary | ICD-10-CM

## 2017-10-11 DIAGNOSIS — Z01818 Encounter for other preprocedural examination: Secondary | ICD-10-CM

## 2017-10-11 DIAGNOSIS — R079 Chest pain, unspecified: Secondary | ICD-10-CM | POA: Diagnosis not present

## 2017-10-11 DIAGNOSIS — I10 Essential (primary) hypertension: Secondary | ICD-10-CM

## 2017-10-11 DIAGNOSIS — R011 Cardiac murmur, unspecified: Secondary | ICD-10-CM

## 2017-10-11 DIAGNOSIS — G8929 Other chronic pain: Secondary | ICD-10-CM | POA: Insufficient documentation

## 2017-10-11 DIAGNOSIS — I2510 Atherosclerotic heart disease of native coronary artery without angina pectoris: Secondary | ICD-10-CM | POA: Diagnosis not present

## 2017-10-11 MED ORDER — METOPROLOL TARTRATE 50 MG PO TABS: 50.0000 mg | ORAL_TABLET | Freq: Once | ORAL | 0 refills | Status: DC

## 2017-10-11 MED FILL — METOPROLOL TARTRATE 50 MG T: 50 | 1 days supply | Qty: 1 | Fill #0

## 2017-10-11 NOTE — Patient Instructions (Addendum)
NO MEDICATION CHANGES AT PRESENT   SCHEDULE   TESTS AT [place] Your physician has requested that you have a carotid duplex. This test is an ultrasound of the carotid arteries in your neck. It looks at blood flow through these arteries that supply the brain with blood. Allow one hour for this exam. There are no restrictions or special instructions.   AT [place] has requested that you have an echocardiogram. Echocardiography is a painless test that uses sound waves to create images of your heart. It provides your doctor with information about the size and shape of your heart and how well your heart's chambers and valves are working. This procedure takes approximately one hour. There are no restrictions for this procedure.  [place] Your physician has requested that you have cardiac CT. Cardiac computed tomography (CT) is a painless test that uses an x-ray machine to take clear, detailed pictures of your heart. For further information please visit HugeFiesta.[email or web address]. Please follow instruction sheet as given.     INSTRUCTIONS FOR  CORONARY CTA    Please arrive at the [facility] main entrance of [facility] at (30-45 minutes prior to test start time)  [facility] [place], [place] [address] [phone]  Proceed to the [facility] Radiology Department (First Floor).  Please follow these instructions carefully (unless otherwise directed):  PLEASE HAVE LABS - BMP  AT LEAST ONE WEEK PRIOR TO TEST  On the Night Before the Test: . Drink plenty of water. . Do not consume any caffeinated/decaffeinated beverages or chocolate 12 hours prior to your test. . Do not take any antihistamines 12 hours prior to your test.    On the Day of the Test: . Drink plenty of water. Do not drink any water within one hour of the test. . Do not eat any food 4 hours prior to the  test. . You may take your regular medications prior to the test. . ITake 50 mg of lopressor (metoprolol) one hour before the test.   After the Test: . Drink plenty of water. . After receiving IV contrast, you may experience a mild flushed feeling. This is normal. . On occasion, you may experience a mild rash up to 24 hours after the test. This is not dangerous. If this occurs, you can take Benadryl 25 mg and increase your fluid intake. . If you experience trouble breathing, this can be serious. If it is severe call 911 IMMEDIATELY. If it is mild, please call our office.

## 2017-10-11 NOTE — Progress Notes (Signed)
PCP: Glendale Chard, MD ; [name], FNP  Clinic Note: Chief Complaint  Patient presents with  . New Patient (Initial Visit)    Prior Cardiac Evaulations - NON-ischemic  . Chest Pain    HPI: Peggy [name] is a 72 y.o. female with a PMH below --  notable for hypertension and hyperlipidemia as well as family history of CAD (although not premature) with prior negative ischemic evaluation to including 2 cardiac catheterizations with normal coronary arteries -- who is being seen today for the evaluation of chest pain and dyspnea at the request of [name], [place].  Peggy [name] was seen on January 26 as part of a ER visit follow-up for evaluation of chest pain.  Initial episode of chest pain led to ER visit on January 1.  She then had another episode several days prior to seeing Ms. [name], NP. Left-sided chest pain without dyspnea, fatigue, nausea or palpitations.  Also noted exertional and resting dyspnea.  (Patient asked for referral to see me).  Recent Hospitalizations:   ER visit September 04, 2017 -she presented with chest discomfort with some radiation to the left arm.  Per her report, her blood pressure had gone up and had some chest pain that lasted about 10 hours.  She said it was starting in the front and went around to the back started below her breasts.  However by the time she was evaluated by the ER physician, symptoms had a abated.  She ruled out for MI.  Studies Personally Reviewed - (if available, images/films reviewed: From Epic Chart or Care Everywhere)  Cardiac Catheterization November 2005: Angiographically normal coronary arteries.  Cardiac Catheterization February 2012 (Dr. Eben Burow): Angiographically normal coronary arteries with normal LV Gram. -Admitted for observation for chest pain in June 2016 -> no formal consultation  2 D Echo June 2016:: Mod Conc LVH. EF 60-65%.  No RWMA. GR 1 D. Mild Ao Stenosis. Mild LA dilation.  Myoview Stress Test June 2016:  EF 65%.  No reversible ischemia.  Possible scar along the cardiac apex and anteroseptal wall.  (Mild severity, medium-sized defect in the apex anteroseptal wall noted on both resting and stress images).  No inducible ischemia.  Read as LOW RISK  Interval History: Peggy [name] presents here today to follow-up her chest pain evaluate lesions in the emergency room and with a new PCP.  She pretty much says she is short of breath all the time and is currently having frequent coughing spells.  She tells me that the episode prior to her ER visit lasted about 10 hours.  She noted her blood pressure was high and the chest discomfort was localized in the epigastric area radiating across the chest/diaphragm area.  She then had an episode prior to seeing her PCP which she described as being a left upper chest discomfort where she felt it after shopping at [place].  She had a similar episode Tuesday of this past week where she had pain beginning in the left upper chest as a sharp stabbing discomfort.  This occurred with sitting down and not doing any activity.  Prior to that she had an episode similar to the previous episode where it radiated across the chest after again shopping --this discomfort was not the sharp stabbing type pain, but more of a cramp 10 sensation..  When she went to go sit down in her car, she felt the symptom.  This episode did not last as long as the preceding 2 episodes. She also  notes that she is been coughing quite a bit off and on and has been having more notable dyspnea.  When she has the chest discomfort, especially the last 2 episodes, she noted her heart rate going up quite a bit.  She also notes something about having pain in the back of her neck radiating up her head and is concerned about carotid disease. Interestingly, none of the chest pain episodes were associated with exertion besides being after exerting herself on one occasion. In addition to the chest discomfort she also notes about 3  times a week having sensation of feeling her heart racing that is not always associated with her chest discomfort.  She oftentimes notes it when she is feeling somewhat tired.  When this happens, she says she feels a little dizzy and lightheaded,, but has not had any syncope or near syncope.  What she notes on occasion also is that she has a sensation of feeling a pounding pulsation in her neck and can hear her heartbeat.   Remainder of cardiac review of symptoms:  No PND, orthopnea or edema. No palpitations, lightheadedness, dizziness, weakness or syncope/near syncope. No TIA/amaurosis fugax symptoms. No claudication.  ROS: A comprehensive was performed. Review of Systems  Constitutional: Negative for malaise/fatigue.  HENT: Positive for congestion. Negative for nosebleeds.   Respiratory: Positive for cough, sputum production (Only whitish sputum.  Nothing like green or yellow) and shortness of breath.   Gastrointestinal: Positive for abdominal pain (Pain described with hand motion is actually epigastric radiating to both sides.). Negative for blood in stool and melena.  Genitourinary: Negative for hematuria.  Musculoskeletal: Positive for joint pain.  Neurological: Positive for dizziness.  Psychiatric/Behavioral: Negative.   All other systems reviewed and are negative.   I have reviewed and (if needed) personally updated the patient's problem list, medications, allergies, past medical and surgical history, social and family history.   Past Medical History:  Diagnosis Date  . Anxiety   . Arthritis    "all over"  . Asthma   . Bladder cancer ([place])   . Chronic bronchitis ([place])    'get it q yr"  . Chronic lower back pain   . Daily headache   . Deafness in left ear   . Depression   . DVT (deep venous thrombosis) ([place]) 2009/2010   "probably left leg"   . GERD (gastroesophageal reflux disease)   . History of cardiac catheterization 2005, 2012   Both catheterizations for "anginal  symptoms "showed angiographically normal coronary arteries. -->  Negative Myoview June 2016.  Coronary calcification noted on chest CT  . History of kidney stones   . Hypercholesteremia   . Hypertension   . Nummular eczema   . Skin cancer    "right abdomen"    Past Surgical History:  Procedure Laterality Date  . ABDOMINAL HYSTERECTOMY  1980's  . APPENDECTOMY Right   . BACK SURGERY     x 2, lower  . BLADDER TUMOR EXCISION    . BREAST BIOPSY Right   . BREAST LUMPECTOMY    . CARDIAC CATHETERIZATION  11/'05; 2/'12   Angiographically normal coronary arteries  . COLONOSCOPY WITH PROPOFOL N/A 02/26/2015   Procedure: COLONOSCOPY WITH PROPOFOL;  Surgeon: Carol Ada, MD;  Location: [facility];  Service: Endoscopy;  Laterality: N/A;  . ESOPHAGOGASTRODUODENOSCOPY Left 02/20/2015   Procedure: ESOPHAGOGASTRODUODENOSCOPY (EGD);  Surgeon: Milus Banister, MD;  Location: [facility];  Service: Endoscopy;  Laterality: Left;  . JOINT REPLACEMENT    . KNEE  ARTHROSCOPY Right ~ 2000  . LUMBAR DISC SURGERY  1980's X 1; ~ 2002  . MEDIAL PARTIAL KNEE REPLACEMENT Right 2001  . NM MYOVIEW LTD  02/2015   EF 65%.  No reversible ischemia.  Possible scar along the cardiac apex and anteroseptal wall.  (Mild severity, medium-sized defect in the apex anteroseptal wall noted on both resting and stress images).  No inducible ischemia.  Read as LOW RISK  . PARTIAL KNEE ARTHROPLASTY Left 2009  . REPLACEMENT TOTAL KNEE BILATERAL Bilateral   . REVISION TOTAL KNEE ARTHROPLASTY Left   . SKIN CANCER EXCISION Right    "abdomen"  . TRANSTHORACIC ECHOCARDIOGRAM  02/2015    Mod Conc LVH. EF 60-65%.  No RWMA. GR 1 D. Mild Ao Stenosis. Mild LA dilation.    Current Meds  Medication Sig  . aspirin 325 MG tablet Take 325-650 mg by mouth daily as needed for moderate pain or fever (chest pain.).   Marland Kitchen divalproex (DEPAKOTE ER) 500 MG 24 hr tablet Take 1,000 mg by mouth at bedtime.   Marland Kitchen doxycycline (VIBRAMYCIN) 100 MG capsule  Take 1 capsule (100 mg total) by mouth 2 (two) times daily. One po bid x 7 days  . feeding supplement, ENSURE ENLIVE, (ENSURE ENLIVE) LIQD Take 237 mLs by mouth 2 (two) times daily between meals. (Patient taking differently: Take 237 mLs by mouth 2 (two) times daily as needed (uses when not eating as much). )  . ferrous sulfate 325 (65 FE) MG tablet Take 1 tablet (325 mg total) by mouth 3 (three) times daily with meals.  Marland Kitchen lisinopril-hydrochlorothiazide (PRINZIDE,ZESTORETIC) 20-12.5 MG tablet Take 1 tablet by mouth daily.  . simvastatin (ZOCOR) 80 MG tablet Take 80 mg by mouth at bedtime.  . valACYclovir (VALTREX) 1000 MG tablet Take 1 g by mouth 2 (two) times daily as needed (for shingles).     Allergies  Allergen Reactions  . Azithromycin Itching, Swelling and Rash    Rash and swelling   . Levaquin [Levofloxacin In D5w] Hives, Itching, Swelling and Rash  . Penicillins Itching, Swelling and Rash  . Clindamycin/Lincomycin Hives  . Erythromycin Base Itching  . Keflex [Cephalexin] Itching and Rash  . Sulfa Antibiotics Itching and Rash  . Tetracyclines & Related Itching and Rash    Social History   Tobacco Use  . Smoking status: Never Smoker  . Smokeless tobacco: Never Used  Substance Use Topics  . Alcohol use: No  . Drug use: No   Social History   Social History Narrative   Relocated from [place]   She is currently "'.  She lives with her son (who is autistic).  She herself is disabled, and not working.   She never smoked and never drank alcohol.   She enjoys walking 7 days a week.    family history includes Cancer in her mother and sister; Heart attack in her father, sister, and sister; Heart failure in her mother, sister, and sister; Hypertension in her father and mother.  Wt Readings from Last 3 Encounters:  10/11/17 214 lb (97.1 kg)  02/05/17 205 lb (93 kg)  12/25/15 213 lb 6 oz (96.8 kg)    PHYSICAL EXAM BP 130/62   Pulse 88   Ht 5' 4.5" (1.638 m)   Wt 214 lb  (97.1 kg)   SpO2 99%   BMI 36.17 kg/m  Physical Exam  Constitutional: She is oriented to person, place, and time. She appears well-developed and well-nourished. No distress.  Moderately obese  HENT:  Head: Normocephalic and atraumatic.  Mouth/Throat: No oropharyngeal exudate.  Eyes: Conjunctivae and EOM are normal. No scleral icterus.  Neck: Normal range of motion. Neck supple. No hepatojugular reflux and no JVD present. Carotid bruit is present (Left bruit). No thyromegaly present.  Cardiovascular: Normal rate, regular rhythm, intact distal pulses and normal pulses.  No extrasystoles are present. PMI is not displaced. Exam reveals no gallop and no friction rub.  Murmur heard.  Medium-pitched harsh crescendo-decrescendo midsystolic murmur is present with a grade of 1/6 at the upper right sternal border radiating to the neck. Pulmonary/Chest: Effort normal and breath sounds normal. No respiratory distress. She has no wheezes. She has no rales (Rhonchi noted.  Cleared with cough). She exhibits tenderness (Left upper chest along left sternal border).  Several coughing spells during exam  Abdominal: Soft. Bowel sounds are normal. She exhibits no distension. There is tenderness (Epigastric). There is no rebound and no guarding.  Musculoskeletal: Normal range of motion. She exhibits no edema.  Neurological: She is alert and oriented to person, place, and time. No cranial nerve deficit.  Skin: Skin is warm and dry.  Psychiatric: She has a normal mood and affect. Her behavior is normal. Judgment and thought content normal.  Nursing note and vitals reviewed.   Adult ECG Report Not checked   Other studies Reviewed: Additional studies/ records that were reviewed today include:  Recent Labs:   Lab Results  Component Value Date   CHOL 245 (H) 12/10/2012   HDL 58 12/10/2012   LDLCALC 152 (H) 12/10/2012   TRIG 173 (H) 12/10/2012   CHOLHDL 4.2 12/10/2012   Lab Results  Component Value Date    CREATININE 1.01 (H) 09/04/2017   BUN 10 09/04/2017   NA 140 09/04/2017   K 3.9 09/04/2017   CL 108 09/04/2017   CO2 25 09/04/2017     ASSESSMENT / PLAN: Problem List Items Addressed This Visit    Chest pain with moderate risk for cardiac etiology - Primary (Chronic)    Several different types of chest discomfort, however she does have risk factors of obesity, hypertension and dyslipidemia with by report from which I am not sure 2 sisters having CAD (she did not but that in her intake form here)  She has had negative ischemic evaluations with 2 cardiac catheterizations and then a Myoview showing no ST questionable inferior infarct.  This may very well of been an artifact and not true infarct. To avoid confusion and to give a more definitive diagnosis, we will evaluate with Coronary Artery CT Angiogram provides anatomic evaluation as well as possible physiologic evaluation with FFR.      Relevant Orders   Basic metabolic panel   CT CORONARY MORPH W/CTA COR W/SCORE W/CA W/CM &/OR WO/CM   CT CORONARY FRACTIONAL FLOW RESERVE DATA PREP   ECHOCARDIOGRAM COMPLETE   Coronary artery calcification seen on CAT scan (Chronic)    She has had a long standing evidence of coronary calcification, but has had 2- cardiac catheterizations and a normal/nonischemic Myoview back in 2016 (this did comment on a possible prior infarct -but was read as low risk) --> with now having several episodes of recurrent chest pain that have some typical and otherwise atypical features, I think we can evaluate for significant coronary disease with a noninvasive method that would not lead to the same confusion seen on nuclear stress test 3 years ago.  Plan: Coronary CT Angiogram with possible CT FFR For now continue aspirin, statin and  ACE inhibitor --> probably has a diagnosis of COPD, therefore we will hold off on beta-blocker until we see for sure that she has evidence of true CAD.      Relevant Orders   Basic metabolic  panel   CT CORONARY MORPH W/CTA COR W/SCORE W/CA W/CM &/OR WO/CM   CT CORONARY FRACTIONAL FLOW RESERVE DATA PREP   CT CORONARY FRACTIONAL FLOW RESERVE FLUID ANALYSIS   ECHOCARDIOGRAM COMPLETE   Essential hypertension (Chronic)    Controlled on ACE inhibitor-HCTZ      Hyperlipidemia with target LDL less than 100 (Chronic)    Last labs in [facility] health system were from 2014.  She remains on high-dose simvastatin, tolerating it relatively well. --Depending upon coronary CT angiogram evaluation, may require closer monitoring of lipids.  For now we will just simply continue on statin.      Left carotid bruit (Chronic)    This may well be the radiated left ear murmur, however cannot exclude carotid disease. Plan: Carotid Dopplers      Relevant Orders   VAS US CAROTID   Systolic ejection murmur (Chronic)    Previously documented mild aortic stenosis.  With chronic dyspnea, will evaluate for any progression of disease with a follow-up echocardiogram.      Relevant Orders   ECHOCARDIOGRAM COMPLETE    Other Visit Diagnoses    Pre-op testing       Relevant Orders   Basic metabolic panel      Current medicines are reviewed at length with the patient today. (+/- concerns) none The following changes have been made:None  Patient Instructions  NO MEDICATION CHANGES AT PRESENT   SCHEDULE   TESTS AT [place] has requested that you have a carotid duplex. This test is an ultrasound of the carotid arteries in your neck. It looks at blood flow through these arteries that supply the brain with blood. Allow one hour for this exam. There are no restrictions or special instructions.   AT [place] has requested that you have an echocardiogram. Echocardiography is a painless test that uses sound waves to create images of your heart. It provides your doctor with information about the size and shape of your heart and how  well your heart's chambers and valves are working. This procedure takes approximately one hour. There are no restrictions for this procedure.  [place] Your physician has requested that you have cardiac CT. Cardiac computed tomography (CT) is a painless test that uses an x-ray machine to take clear, detailed pictures of your heart. For further information please visit [facility]ugeFiesta[facility][email or web address]. Please follow instruction sheet as given.   Studies Ordered:   Orders Placed This Encounter  Procedures  . CT CORONARY MORPH W/CTA COR W/SCORE W/CA W/CM &/OR WO/CM  . CT CORONARY FRACTIONAL FLOW RESERVE DATA PREP  . CT CORONARY FRACTIONAL FLOW RESERVE FLUID ANALYSIS  . Basic metabolic panel  . ECHOCARDIOGRAM COMPLETE      Glenetta Hew, M.D., M.S. Interventional Cardiologist   Pager # [phone] Phone # [phone] [address] [place], [place] [address]   Thank you for choosing Heart[facility] at [facility]!!

## 2017-10-13 ENCOUNTER — Encounter: Payer: Self-pay | Admitting: Cardiology

## 2017-10-13 NOTE — Assessment & Plan Note (Signed)
Several different types of chest discomfort, however she does have risk factors of obesity, hypertension and dyslipidemia with by report from which I am not sure 2 sisters having CAD (she did not but that in her intake form here)  She has had negative ischemic evaluations with 2 cardiac catheterizations and then a Myoview showing no ST questionable inferior infarct.  This may very well of been an artifact and not true infarct. To avoid confusion and to give a more definitive diagnosis, we will evaluate with Coronary Artery CT Angiogram provides anatomic evaluation as well as possible physiologic evaluation with FFR.

## 2017-10-13 NOTE — Assessment & Plan Note (Signed)
Previously documented mild aortic stenosis.  With chronic dyspnea, will evaluate for any progression of disease with a follow-up echocardiogram.

## 2017-10-13 NOTE — Assessment & Plan Note (Signed)
This may well be the radiated left ear murmur, however cannot exclude carotid disease. Plan: Carotid Dopplers

## 2017-10-13 NOTE — Assessment & Plan Note (Signed)
Last labs in [facility] health system were from 2014.  She remains on high-dose simvastatin, tolerating it relatively well. --Depending upon coronary CT angiogram evaluation, may require closer monitoring of lipids.  For now we will just simply continue on statin.

## 2017-10-13 NOTE — Assessment & Plan Note (Signed)
She has had a long standing evidence of coronary calcification, but has had 2- cardiac catheterizations and a normal/nonischemic Myoview back in 2016 (this did comment on a possible prior infarct -but was read as low risk) --> with now having several episodes of recurrent chest pain that have some typical and otherwise atypical features, I think we can evaluate for significant coronary disease with a noninvasive method that would not lead to the same confusion seen on nuclear stress test 3 years ago.  Plan: Coronary CT Angiogram with possible CT FFR For now continue aspirin, statin and ACE inhibitor --> probably has a diagnosis of COPD, therefore we will hold off on beta-blocker until we see for sure that she has evidence of true CAD.

## 2017-10-13 NOTE — Assessment & Plan Note (Signed)
Controlled on ACE inhibitor-HCTZ

## 2017-10-16 DIAGNOSIS — F411 Generalized anxiety disorder: Secondary | ICD-10-CM | POA: Diagnosis not present

## 2017-10-16 DIAGNOSIS — F330 Major depressive disorder, recurrent, mild: Secondary | ICD-10-CM | POA: Diagnosis not present

## 2017-10-18 ENCOUNTER — Ambulatory Visit (HOSPITAL_COMMUNITY)
Admission: RE | Admit: 2017-10-18 | Discharge: 2017-10-18 | Disposition: A | Discharge status: Home / Self Care | Payer: Medicare HMO | Source: Ambulatory Visit | Attending: Cardiology | Admitting: Cardiology

## 2017-10-18 DIAGNOSIS — Z01818 Encounter for other preprocedural examination: Secondary | ICD-10-CM | POA: Diagnosis not present

## 2017-10-18 DIAGNOSIS — R0989 Other specified symptoms and signs involving the circulatory and respiratory systems: Secondary | ICD-10-CM | POA: Diagnosis not present

## 2017-10-18 DIAGNOSIS — R079 Chest pain, unspecified: Secondary | ICD-10-CM | POA: Diagnosis not present

## 2017-10-18 DIAGNOSIS — I6523 Occlusion and stenosis of bilateral carotid arteries: Secondary | ICD-10-CM | POA: Diagnosis not present

## 2017-10-18 DIAGNOSIS — I2510 Atherosclerotic heart disease of native coronary artery without angina pectoris: Secondary | ICD-10-CM | POA: Diagnosis not present

## 2017-10-18 LAB — BASIC METABOLIC PANEL
BUN / CREAT RATIO: 11 — AB (ref 12–28)
BUN: 9 mg/dL (ref 8–27)
CO2: 25 mmol/L (ref 20–29)
Calcium: 9.2 mg/dL (ref 8.7–10.3)
Chloride: 105 mmol/L (ref 96–106)
Creatinine, Ser: 0.81 mg/dL (ref 0.57–1.00)
GFR calc non Af Amer: 72 mL/min/{1.73_m2} (ref >59)
GFR, EST AFRICAN AMERICAN: 83 mL/min/{1.73_m2} (ref >59)
Glucose: 78 mg/dL (ref 65–99)
Potassium: 4.8 mmol/L (ref 3.5–5.2)
SODIUM: 143 mmol/L (ref 134–144)

## 2017-10-19 ENCOUNTER — Encounter (HOSPITAL_COMMUNITY): Payer: Medicare HMO

## 2017-10-19 ENCOUNTER — Ambulatory Visit (HOSPITAL_COMMUNITY): Payer: Medicare HMO | Attending: Cardiology

## 2017-10-19 ENCOUNTER — Other Ambulatory Visit: Payer: Self-pay

## 2017-10-19 DIAGNOSIS — I2510 Atherosclerotic heart disease of native coronary artery without angina pectoris: Secondary | ICD-10-CM | POA: Diagnosis not present

## 2017-10-19 DIAGNOSIS — R011 Cardiac murmur, unspecified: Secondary | ICD-10-CM | POA: Diagnosis not present

## 2017-10-19 DIAGNOSIS — I5030 Unspecified diastolic (congestive) heart failure: Secondary | ICD-10-CM | POA: Diagnosis not present

## 2017-10-19 DIAGNOSIS — R079 Chest pain, unspecified: Secondary | ICD-10-CM | POA: Diagnosis not present

## 2017-10-21 NOTE — Progress Notes (Signed)
Echo results: Good news: Essentially normal echocardiogram and normal pump function and normal valve function. Nothing notable to explain a murmur. EF:60-65%. No regional wall motion abnormalities = No signs to suggest heart attack.  Glenetta Hew, MD  pls fwd to PCP: Glendale Chard, MD

## 2017-10-22 ENCOUNTER — Telehealth: Payer: Self-pay | Admitting: *Deleted

## 2017-10-22 DIAGNOSIS — R0989 Other specified symptoms and signs involving the circulatory and respiratory systems: Secondary | ICD-10-CM

## 2017-10-22 NOTE — Telephone Encounter (Signed)
-----   Message from Leonie Man, MD sent at 10/21/2017  3:34 PM EST ----- Echo results: Good news: Essentially normal echocardiogram and normal pump function and normal valve function. Nothing notable to explain a murmur. EF:60-65%. No regional wall motion abnormalities = No signs to suggest heart attack.  Glenetta Hew, MD  pls fwd to PCP: Glendale Chard, MD

## 2017-10-22 NOTE — Telephone Encounter (Signed)
-----   Message from Leonie Man, MD sent at 10/21/2017  3:50 PM EST ----- Carotid Dopplers: 40-59% stenosis on the right internal carotid, less than 40% stenosis in the left.  Both vertebral arteries show normal flow.  Left subclavian artery is normal, but there is some flow disturbance in the right simply renal artery.  -Both the right subclavian artery flow disturbance and the moderate disease in the right internal carotid could be explanation for the bruit.  Follow-up in 1 year.  Glenetta Hew, MD   pls fwd to PCP: Glendale Chard, MD

## 2017-10-22 NOTE — Telephone Encounter (Signed)
Spoke to patient.  Carotid,ECHO,LABS,Result given . Verbalized understanding. ROUTED results to Dr Baird Cancer' office.  Placed order for carotid for 2020 to be schedule.

## 2017-10-23 ENCOUNTER — Other Ambulatory Visit: Payer: Self-pay | Admitting: Gastroenterology

## 2017-10-23 DIAGNOSIS — D509 Iron deficiency anemia, unspecified: Secondary | ICD-10-CM | POA: Diagnosis not present

## 2017-10-23 DIAGNOSIS — R140 Abdominal distension (gaseous): Secondary | ICD-10-CM | POA: Diagnosis not present

## 2017-10-23 DIAGNOSIS — K5730 Diverticulosis of large intestine without perforation or abscess without bleeding: Secondary | ICD-10-CM | POA: Diagnosis not present

## 2017-10-23 DIAGNOSIS — K5900 Constipation, unspecified: Secondary | ICD-10-CM | POA: Diagnosis not present

## 2017-10-23 DIAGNOSIS — Z86010 Personal history of colonic polyps: Secondary | ICD-10-CM | POA: Diagnosis not present

## 2017-10-30 MED FILL — FLUoxetine HCL 40 MG CAPS: 40 | 60 days supply | Qty: 60 | Fill #0

## 2017-10-30 MED FILL — DIVALPROEX SOD ER 500 MG TA: 500 | 30 days supply | Qty: 60 | Fill #2

## 2017-10-31 MED FILL — GAVILYTE-G SOLUTION: 236 | 1 days supply | Qty: 4000 | Fill #0

## 2017-10-31 MED FILL — diazePAM 5 MG TABS: 5 | 8 days supply | Qty: 30 | Fill #2

## 2017-11-02 HISTORY — PX: OTHER SURGICAL HISTORY: SHX169

## 2017-11-06 ENCOUNTER — Encounter (HOSPITAL_COMMUNITY): Payer: Self-pay | Admitting: Emergency Medicine

## 2017-11-06 ENCOUNTER — Other Ambulatory Visit: Payer: Self-pay

## 2017-11-12 ENCOUNTER — Ambulatory Visit (HOSPITAL_COMMUNITY)
Admission: RE | Admit: 2017-11-12 | Discharge: 2017-11-12 | Disposition: A | Discharge status: Home / Self Care | Payer: Medicare HMO | Source: Ambulatory Visit | Attending: Cardiology | Admitting: Cardiology

## 2017-11-12 ENCOUNTER — Ambulatory Visit (HOSPITAL_COMMUNITY): Payer: Medicare HMO

## 2017-11-12 ENCOUNTER — Encounter (HOSPITAL_COMMUNITY): Payer: Self-pay

## 2017-11-12 DIAGNOSIS — R079 Chest pain, unspecified: Secondary | ICD-10-CM | POA: Diagnosis not present

## 2017-11-12 DIAGNOSIS — I2510 Atherosclerotic heart disease of native coronary artery without angina pectoris: Secondary | ICD-10-CM | POA: Diagnosis not present

## 2017-11-12 MED ORDER — NITROGLYCERIN 0.4 MG SL SUBL
SUBLINGUAL_TABLET | SUBLINGUAL | Status: AC
  Administered 2017-11-12: 0.8 mg via SUBLINGUAL
  Filled 2017-11-12: qty 2

## 2017-11-12 MED ORDER — METOPROLOL TARTRATE 5 MG/5ML IV SOLN
INTRAVENOUS | Status: AC
  Administered 2017-11-12: 5 mg via INTRAVENOUS
  Filled 2017-11-12: qty 15

## 2017-11-12 MED ORDER — IOPAMIDOL (ISOVUE-370) INJECTION 76%
INTRAVENOUS | Status: AC
  Administered 2017-11-12: 100 mL
  Filled 2017-11-12: qty 100

## 2017-11-12 MED ORDER — NITROGLYCERIN 0.4 MG SL SUBL
0.8000 mg | SUBLINGUAL_TABLET | Freq: Once | SUBLINGUAL | Status: AC
  Administered 2017-11-12: 0.8 mg via SUBLINGUAL

## 2017-11-12 MED ORDER — METOPROLOL TARTRATE 5 MG/5ML IV SOLN
5.0000 mg | INTRAVENOUS | Status: DC | PRN
  Administered 2017-11-12 (×2): 5 mg via INTRAVENOUS

## 2017-11-16 DIAGNOSIS — E782 Mixed hyperlipidemia: Secondary | ICD-10-CM | POA: Diagnosis not present

## 2017-11-16 DIAGNOSIS — I10 Essential (primary) hypertension: Secondary | ICD-10-CM | POA: Diagnosis not present

## 2017-11-16 DIAGNOSIS — D649 Anemia, unspecified: Secondary | ICD-10-CM | POA: Diagnosis not present

## 2017-11-16 DIAGNOSIS — F329 Major depressive disorder, single episode, unspecified: Secondary | ICD-10-CM | POA: Diagnosis not present

## 2017-11-16 MED FILL — BOOSTRIX VACCINE SYRINGE: 5-2.5-18.5 | 1 days supply | Qty: 1 | Fill #0

## 2017-11-28 MED FILL — DIVALPROEX SOD ER 500 MG TA: 500 | 30 days supply | Qty: 60 | Fill #3

## 2017-11-28 MED FILL — TRANDOLAPRIL 2 MG TAB: 2 | 90 days supply | Qty: 90 | Fill #0

## 2017-12-05 NOTE — Anesthesia Preprocedure Evaluation (Addendum)
Anesthesia Evaluation  Patient identified by MRN, date of birth, ID band Patient awake    Reviewed: Allergy & Precautions, NPO status , Patient's Chart, lab work & pertinent test results, reviewed documented beta blocker date and time   Airway Mallampati: II  TM Distance: >3 FB Neck ROM: Full    Dental  (+) Partial Lower, Partial Upper   Pulmonary asthma ,    Pulmonary exam normal breath sounds clear to auscultation       Cardiovascular hypertension, Pt. on medications + CAD and + Peripheral Vascular Disease  Normal cardiovascular exam+ Valvular Problems/Murmurs MR  Rhythm:Regular Rate:Normal  Hx/o DVT   Neuro/Psych  Headaches, PSYCHIATRIC DISORDERS Anxiety Depression  Neuromuscular disease    GI/Hepatic Neg liver ROS, GERD  Medicated and Controlled,  Endo/Other  negative endocrine ROS  Renal/GU negative Renal ROSHx/o renal calculi   Hx/o Bladder Ca    Musculoskeletal  (+) Arthritis , Osteoarthritis,  Hx/o Nummular eczema   Abdominal (+) + obese,   Peds  Hematology  (+) anemia , Hyperlipidemia   Anesthesia Other Findings   Reproductive/Obstetrics                           Anesthesia Physical Anesthesia Plan  ASA: III  Anesthesia Plan: MAC   Post-op Pain Management:    Induction: Intravenous  PONV Risk Score and Plan: 2 and Propofol infusion, Treatment may vary due to age or medical condition and Ondansetron  Airway Management Planned: Natural Airway, Nasal Cannula and Simple Face Mask  Additional Equipment:   Intra-op Plan:   Post-operative Plan:   Informed Consent: I have reviewed the patients History and Physical, chart, labs and discussed the procedure including the risks, benefits and alternatives for the proposed anesthesia with the patient or authorized representative who has indicated his/her understanding and acceptance.   Dental advisory given  Plan Discussed with:  CRNA, Anesthesiologist and Surgeon  Anesthesia Plan Comments:        Anesthesia Quick Evaluation

## 2017-12-06 ENCOUNTER — Encounter (HOSPITAL_COMMUNITY)
Admission: RE | Disposition: A | Discharge status: Home / Self Care | Payer: Self-pay | Source: Ambulatory Visit | Attending: Gastroenterology

## 2017-12-06 ENCOUNTER — Ambulatory Visit (HOSPITAL_COMMUNITY)
Admission: RE | Admit: 2017-12-06 | Discharge: 2017-12-06 | Disposition: A | Discharge status: Home / Self Care | Payer: Medicare HMO | Source: Ambulatory Visit | Attending: Gastroenterology | Admitting: Gastroenterology

## 2017-12-06 ENCOUNTER — Ambulatory Visit (HOSPITAL_COMMUNITY): Payer: Medicare HMO | Admitting: Anesthesiology

## 2017-12-06 ENCOUNTER — Other Ambulatory Visit: Payer: Self-pay

## 2017-12-06 ENCOUNTER — Encounter (HOSPITAL_COMMUNITY): Payer: Self-pay | Admitting: *Deleted

## 2017-12-06 DIAGNOSIS — I10 Essential (primary) hypertension: Secondary | ICD-10-CM | POA: Insufficient documentation

## 2017-12-06 DIAGNOSIS — Z8551 Personal history of malignant neoplasm of bladder: Secondary | ICD-10-CM | POA: Diagnosis not present

## 2017-12-06 DIAGNOSIS — K31811 Angiodysplasia of stomach and duodenum with bleeding: Secondary | ICD-10-CM | POA: Diagnosis not present

## 2017-12-06 DIAGNOSIS — Z86010 Personal history of colonic polyps: Secondary | ICD-10-CM | POA: Insufficient documentation

## 2017-12-06 DIAGNOSIS — Z880 Allergy status to penicillin: Secondary | ICD-10-CM | POA: Diagnosis not present

## 2017-12-06 DIAGNOSIS — D509 Iron deficiency anemia, unspecified: Secondary | ICD-10-CM | POA: Diagnosis not present

## 2017-12-06 DIAGNOSIS — Z79899 Other long term (current) drug therapy: Secondary | ICD-10-CM | POA: Diagnosis not present

## 2017-12-06 DIAGNOSIS — E7800 Pure hypercholesterolemia, unspecified: Secondary | ICD-10-CM | POA: Diagnosis not present

## 2017-12-06 DIAGNOSIS — F419 Anxiety disorder, unspecified: Secondary | ICD-10-CM | POA: Insufficient documentation

## 2017-12-06 DIAGNOSIS — Z7982 Long term (current) use of aspirin: Secondary | ICD-10-CM | POA: Diagnosis not present

## 2017-12-06 DIAGNOSIS — K219 Gastro-esophageal reflux disease without esophagitis: Secondary | ICD-10-CM | POA: Insufficient documentation

## 2017-12-06 DIAGNOSIS — Z87442 Personal history of urinary calculi: Secondary | ICD-10-CM | POA: Diagnosis not present

## 2017-12-06 DIAGNOSIS — K921 Melena: Secondary | ICD-10-CM | POA: Diagnosis not present

## 2017-12-06 DIAGNOSIS — Z882 Allergy status to sulfonamides status: Secondary | ICD-10-CM | POA: Insufficient documentation

## 2017-12-06 DIAGNOSIS — D500 Iron deficiency anemia secondary to blood loss (chronic): Secondary | ICD-10-CM | POA: Diagnosis not present

## 2017-12-06 DIAGNOSIS — Z881 Allergy status to other antibiotic agents status: Secondary | ICD-10-CM | POA: Diagnosis not present

## 2017-12-06 DIAGNOSIS — Z1211 Encounter for screening for malignant neoplasm of colon: Secondary | ICD-10-CM | POA: Diagnosis not present

## 2017-12-06 DIAGNOSIS — K5730 Diverticulosis of large intestine without perforation or abscess without bleeding: Secondary | ICD-10-CM | POA: Insufficient documentation

## 2017-12-06 DIAGNOSIS — Q2733 Arteriovenous malformation of digestive system vessel: Secondary | ICD-10-CM | POA: Diagnosis not present

## 2017-12-06 HISTORY — PX: COLONOSCOPY WITH PROPOFOL: SHX5780

## 2017-12-06 HISTORY — PX: ESOPHAGOGASTRODUODENOSCOPY (EGD) WITH PROPOFOL: SHX5813

## 2017-12-06 SURGERY — ESOPHAGOGASTRODUODENOSCOPY (EGD) WITH PROPOFOL
Anesthesia: Monitor Anesthesia Care

## 2017-12-06 MED ORDER — LACTATED RINGERS IV SOLN
INTRAVENOUS | Status: DC
  Administered 2017-12-06: 1000 mL via INTRAVENOUS

## 2017-12-06 MED ORDER — PROPOFOL 10 MG/ML IV BOLUS
INTRAVENOUS | Status: AC
  Filled 2017-12-06: qty 20

## 2017-12-06 MED ORDER — ESMOLOL HCL 100 MG/10ML IV SOLN
INTRAVENOUS | Status: DC | PRN
  Administered 2017-12-06: 20 mg via INTRAVENOUS

## 2017-12-06 MED ORDER — ONDANSETRON HCL 4 MG/2ML IJ SOLN
INTRAMUSCULAR | Status: DC | PRN
  Administered 2017-12-06: 4 mg via INTRAVENOUS

## 2017-12-06 MED ORDER — PROPOFOL 500 MG/50ML IV EMUL
INTRAVENOUS | Status: DC | PRN
  Administered 2017-12-06 (×3): 10 mg via INTRAVENOUS
  Administered 2017-12-06: 50 mg via INTRAVENOUS

## 2017-12-06 MED ORDER — PROPOFOL 10 MG/ML IV BOLUS
INTRAVENOUS | Status: AC
  Filled 2017-12-06: qty 40

## 2017-12-06 MED ORDER — PROPOFOL 500 MG/50ML IV EMUL
INTRAVENOUS | Status: DC | PRN
  Administered 2017-12-06: 125 ug/kg/min via INTRAVENOUS

## 2017-12-06 MED ORDER — SODIUM CHLORIDE 0.9 % IV SOLN: INTRAVENOUS | Status: DC

## 2017-12-06 SURGICAL SUPPLY — 24 items

## 2017-12-06 NOTE — Anesthesia Postprocedure Evaluation (Signed)
Anesthesia Post Note  Patient: Peggy [name]  Procedure(s) Performed: ESOPHAGOGASTRODUODENOSCOPY (EGD) WITH PROPOFOL (N/A ) COLONOSCOPY WITH PROPOFOL (N/A )     Patient location during evaluation: PACU Anesthesia Type: MAC Level of consciousness: awake and alert Pain management: pain level controlled Vital Signs Assessment: post-procedure vital signs reviewed and stable Respiratory status: spontaneous breathing, nonlabored ventilation and respiratory function stable Cardiovascular status: stable and blood pressure returned to baseline Postop Assessment: no apparent nausea or vomiting Anesthetic complications: no    Last Vitals:  Vitals:   12/06/17 0637 12/06/17 0805  BP: (!) 155/71 (!) 127/41  Pulse: 100 85  Resp: 20 20  Temp: 37 C 36.8 C  SpO2: 99% 100%    Last Pain:  Vitals:   12/06/17 0805  TempSrc: Oral  PainSc: 0-No pain                 Caddie Randle A.

## 2017-12-06 NOTE — H&P (Signed)
Peggy [name] is an 72 y.o. female.   Chief Complaint: Iron deficiency anemia. HPI: 72 year old black female here for an EGD/Colonoscopy. See office notes for details.  Past Medical History:  Diagnosis Date  . Anxiety   . Arthritis    "all over"  . Asthma   . Bladder cancer ([place])   . Chronic bronchitis ([place])    'get it q yr"  . Chronic lower back pain   . Daily headache   . Deafness in left ear   . Depression   . DVT (deep venous thrombosis) ([place]) 2009/2010   "probably left leg"   . GERD (gastroesophageal reflux disease)   . History of cardiac catheterization 2005, 2012   Both catheterizations for "anginal symptoms "showed angiographically normal coronary arteries. -->  Negative Myoview June 2016.  Coronary calcification noted on chest CT  . History of kidney stones   . Hypercholesteremia   . Hypertension   . Nummular eczema   . Skin cancer    "right abdomen"   Past Surgical History:  Procedure Laterality Date  . ABDOMINAL HYSTERECTOMY  1980's  . APPENDECTOMY Right   . BACK SURGERY     x 2, lower  . BLADDER TUMOR EXCISION    . BREAST BIOPSY Right   . BREAST LUMPECTOMY    . CARDIAC CATHETERIZATION  11/'05; 2/'12   Angiographically normal coronary arteries  . COLONOSCOPY WITH PROPOFOL N/A 02/26/2015   Procedure: COLONOSCOPY WITH PROPOFOL;  Surgeon: Carol Ada, MD;  Location: [facility];  Service: Endoscopy;  Laterality: N/A;  . ESOPHAGOGASTRODUODENOSCOPY Left 02/20/2015   Procedure: ESOPHAGOGASTRODUODENOSCOPY (EGD);  Surgeon: Milus Banister, MD;  Location: [facility];  Service: Endoscopy;  Laterality: Left;  . JOINT REPLACEMENT    . KNEE ARTHROSCOPY Right ~ 2000  . LUMBAR DISC SURGERY  1980's X 1; ~ 2002  . MEDIAL PARTIAL KNEE REPLACEMENT Right 2001  . NM MYOVIEW LTD  02/2015   EF 65%.  No reversible ischemia.  Possible scar along the cardiac apex and anteroseptal wall.  (Mild severity, medium-sized defect in the apex anteroseptal wall noted on both resting and  stress images).  No inducible ischemia.  Read as LOW RISK  . PARTIAL KNEE ARTHROPLASTY Left 2009  . REPLACEMENT TOTAL KNEE BILATERAL Bilateral   . REVISION TOTAL KNEE ARTHROPLASTY Left   . SKIN CANCER EXCISION Right    "abdomen"  . TRANSTHORACIC ECHOCARDIOGRAM  02/2015    Mod Conc LVH. EF 60-65%.  No RWMA. GR 1 D. Mild Ao Stenosis. Mild LA dilation.   Family History  Problem Relation Age of Onset  . Heart failure Mother   . Cancer Mother   . Hypertension Mother   . Hypertension Father   . Heart attack Father        Noted on cardiology intake form as "massive heart attack" at age 36  . Heart failure Sister   . Heart attack Sister        Not listed on her cardiology intake form  . Heart failure Sister   . Heart attack Sister        Not listed on her cardiology intake  . Cancer Sister    Social History:  reports that she has never smoked. She has never used smokeless tobacco. She reports that she does not drink alcohol or use drugs.  Allergies:  Allergies  Allergen Reactions  . Azithromycin Itching, Swelling and Rash    Rash and swelling   . Levaquin [Levofloxacin In  D5w] Hives, Itching, Swelling and Rash  . Penicillins Itching, Swelling and Rash  . Clindamycin/Lincomycin Hives  . Erythromycin Base Itching  . Keflex [Cephalexin] Itching and Rash  . Sulfa Antibiotics Itching and Rash  . Tetracyclines & Related Itching and Rash   Medications Prior to Admission  Medication Sig Dispense Refill  . acetaminophen (TYLENOL) 500 MG tablet Take 1,000 mg by mouth every 8 (eight) hours as needed for mild pain or moderate pain.    Marland Kitchen aspirin 325 MG tablet Take 325-650 mg by mouth daily as needed for moderate pain or fever (chest pain.).     Marland Kitchen diphenhydrAMINE (ALLERGY RELIEF) 25 mg capsule Take 25 mg by mouth daily.    . divalproex (DEPAKOTE ER) 500 MG 24 hr tablet Take 1,000 mg by mouth at bedtime as needed (depression).     Marland Kitchen FLUoxetine (PROZAC) 40 MG capsule Take 40 mg by mouth daily  as needed (depression).    . perindopril (ACEON) 2 MG tablet Take 2 mg by mouth every morning.    . simvastatin (ZOCOR) 80 MG tablet Take 80 mg by mouth at bedtime.    . valACYclovir (VALTREX) 1000 MG tablet Take 1 g by mouth 2 (two) times daily as needed (for shingles).   0  . diazepam (VALIUM) 5 MG tablet Take 5 mg by mouth every 6 (six) hours as needed for anxiety or muscle spasms.     Review of Systems  Constitutional: Positive for malaise/fatigue and weight loss. Negative for chills, diaphoresis and fever.  HENT: Negative.   Eyes: Negative.   Respiratory: Negative.   Cardiovascular: Negative.   Gastrointestinal: Positive for abdominal pain, blood in stool and heartburn. Negative for constipation, diarrhea, nausea and vomiting.  Musculoskeletal: Positive for joint pain.  Skin: Negative.   Neurological: Negative.   Endo/Heme/Allergies: Negative.   Psychiatric/Behavioral: Negative.    Blood pressure (!) 155/71, pulse 100, temperature 98.6 F (37 C), temperature source Oral, resp. rate 20, height 5\' 5"  (1.651 m), weight 97.1 kg (214 lb), SpO2 99 %. Physical Exam  Constitutional: She is oriented to person, place, and time. She appears well-developed and well-nourished.  HENT:  Head: Normocephalic and atraumatic.  Eyes: Conjunctivae and EOM are normal.  Neck: Normal range of motion. Neck supple.  Cardiovascular: Normal rate and regular rhythm.  Respiratory: Effort normal and breath sounds normal.  GI: Soft. Bowel sounds are normal.  Neurological: She is alert and oriented to person, place, and time.  Skin: Skin is warm and dry.  Psychiatric: She has a normal mood and affect. Her behavior is normal. Judgment and thought content normal.    Assessment/Plan Colorectal cancer screening/blood in stool/iron deficiency anemia/GERD-proceed with an EGD/Colonoscopy at this time.  Janina Trafton, MD 12/06/2017, 7:15 AM

## 2017-12-06 NOTE — Op Note (Signed)
[facility] Patient Name: Peggy [name] Procedure Date: 12/06/2017 MRN: [id number] Attending MD: Juanita Craver , MD Date of Birth: 10/30/1945 CSN: [id number] Age: 72 Admit Type: Outpatient Procedure:                Screening colonoscopy. Indications:              CRC screening for colorectal malignant neoplasm. Providers:                Juanita Craver, MD, Elmer Ramp. Tilden Dome, RN, William Dalton, Technician Referring MD:             Theda Belfast. Baird Cancer, MD Medicines:                Monitored Anesthesia Care Complications:            No immediate complications. Estimated Blood Loss:     Estimated blood loss: none. Procedure:                Pre-anesthesia assessment: - Prior to the                            procedure, a history and physical was performed,                            and patient medications and allergies were                            reviewed. The patient's tolerance of previous                            anesthesia was also reviewed. The risks and                            benefits of the procedure and the sedation options                            and risks were discussed with the patient. All                            questions were answered, and informed consent was                            obtained. Prior anticoagulants: The patient has                            taken aspirin, last dose was 7 days prior to                            procedure. ASA Grade assessment: III - A patient                            with severe systemic disease. After reviewing the  risks and benefits, the patient was deemed in                            satisfactory condition to undergo the procedure.                            After obtaining informed consent, the colonoscope                            was passed under direct vision. Throughout the                            procedure, the patient's blood pressure, pulse, and                             oxygen saturations were monitored continuously. The                            [id number] ([id number]) scope was introduced through                            the anus and advanced to the the cecum, identified                            by appendiceal orifice and ileocecal valve. The                            colonoscopy was performed without difficulty. The                            patient tolerated the procedure well. The quality                            of the bowel preparation was adequate. The                            ileocecal valve, the appendiceal orifice and the                            rectum were photographed. The bowel preparation                            used was GoLYTELY. Scope In: 7:41:42 AM Scope Out: 7:54:55 AM Scope Withdrawal Time: 0 hours 6 minutes 29 seconds  Total Procedure Duration: 0 hours 13 minutes 13 seconds  Findings:      A few small-mouthed diverticula were found in the sigmoid colon.      The exam was otherwise without abnormality on direct and retroflexion       views. Impression:               - Diverticulosis in the sigmoid colon.                           - The examination was otherwise normal on direct  and retroflexion views.                           - No specimens collected. Moderate Sedation:      MAC used. Recommendation:           - Resume regular diet daily.                           - Continue present medications.                           - No Ibuprofen, Naproxen, or other non-steroidal                            anti-inflammatory drugs for now.                           - Repeat colonoscopy in 5 years for surveillance.                           - Return to GI office in 2 weeks.                           - If the patient has any abnormal GI symptoms in                            the interim, she has been advised to call the                            office ASAP for further  recommendations. Procedure Code(s):        --- Professional ---                           [phone], Colonoscopy, flexible; diagnostic, including                            collection of specimen(s) by brushing or washing,                            when performed (separate procedure) Diagnosis Code(s):        --- Professional ---                           K57.30, Diverticulosis of large intestine without                            perforation or abscess without bleeding                           Z12.11, Encounter for screening for malignant                            neoplasm of colon                           Z86.010, Personal history  of colonic polyps CPT copyright 2017 American Medical Association. All rights reserved. The codes documented in this report are preliminary and upon coder review may  be revised to meet current compliance requirements. Juanita Craver, MD Juanita Craver, MD 12/06/2017 8:21:41 AM This report has been signed electronically. Number of Addenda: 0

## 2017-12-06 NOTE — Op Note (Signed)
[facility] Patient Name: Peggy [name] Procedure Date: 12/06/2017 MRN: [id number] Attending MD: Juanita Craver , MD Date of Birth: 08-03-1946 CSN: [id number] Age: 72 Admit Type: Outpatient Procedure:                EGD with ablation of duodenal AVM's Indications:              Iron deficiency anemia, Gastro-esophageal reflux                            disease. Providers:                Juanita Craver, MD, Tory Emerald, RN, William Dalton,                            Technician, Rosario Adie, CRNA Referring MD:             Theda Belfast. Baird Cancer, MD Medicines:                Monitored Anesthesia Care Complications:            No immediate complications. Estimated Blood Loss:      Procedure:                Pre-anesthesia assessment: - Prior to the                            procedure, a history and physical was performed,                            and patient medications and allergies were                            reviewed. The patient's tolerance of previous                            anesthesia was also reviewed. The risks and                            benefits of the procedure and the sedation options                            and risks were discussed with the patient. All                            questions were answered, and informed consent was                            obtained. Prior anticoagulants: The patient has                            taken aspirin, last dose was 7 days prior to                            procedure. ASA Grade Assessment: III - A patient  with severe systemic disease. After reviewing the                            risks and benefits, the patient was deemed in                            satisfactory condition to undergo the procedure.                            After obtaining informed consent, the endoscope was                            passed under direct vision. Throughout the   procedure, the patient's blood pressure, pulse, and                            oxygen saturations were monitored continuously. The                            [id number]) scope was introduced through the                            mouth, and advanced to the second part of duodenum.                            The EGD was accomplished without difficulty. The                            patient tolerated the procedure well. Scope In: Scope Out: Findings:      The examined esophagus and GEJ appeared widely patent and normal.      No gross lesions were noted in the entire examined stomach.      The cardia and gastric fundus were normal on retroflexion.      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 Estimated blood loss was minimal. Impression:               - Normal appearing, widely patent esophagus and GEJ.                           - No gross lesions in the stomach.                           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                           - No specimens collected. Moderate Sedation:      MAC used. Recommendation:           - Resume regular diet daily.                           -  Continue present medications.                           - No Ibuprofen, Naproxen, or other non-steroidal                            anti-inflammatory drugs for now.                           - Perform a small bowel enteroscopy (SBE) in 1 week.                           - Return to my office in 2 weeks. Procedure Code(s):        --- Professional ---                           [phone], Esophagogastroduodenoscopy, flexible,                            transoral; with control of bleeding, any method Diagnosis Code(s):        --- Professional ---                           D50.9, Iron deficiency anemia,  unspecified                           K21.9, Gastro-esophageal reflux disease without                            esophagitis                           K31.811, Angiodysplasia of stomach and duodenum                            with bleeding CPT copyright 2017 American Medical Association. All rights reserved. The codes documented in this report are preliminary and upon coder review may  be revised to meet current compliance requirements. Juanita Craver, MD Juanita Craver, MD 12/06/2017 8:14:39 AM This report has been signed electronically. Number of Addenda: 0

## 2017-12-06 NOTE — Anesthesia Procedure Notes (Signed)
Date/Time: 12/06/2017 7:17 AM Performed by: Glory Buff, CRNA Oxygen Delivery Method: Nasal cannula

## 2017-12-06 NOTE — Discharge Instructions (Signed)

## 2017-12-06 NOTE — Transfer of Care (Signed)
Immediate Anesthesia Transfer of Care Note  Patient: Peggy [name]  Procedure(s) Performed: ESOPHAGOGASTRODUODENOSCOPY (EGD) WITH PROPOFOL (N/A ) COLONOSCOPY WITH PROPOFOL (N/A )  Patient Location: PACU  Anesthesia Type:MAC  Level of Consciousness: drowsy, patient cooperative and responds to stimulation  Airway & Oxygen Therapy: Patient Spontanous Breathing and Patient connected to nasal cannula oxygen  Post-op Assessment: Report given to RN and Post -op Vital signs reviewed and stable  Post vital signs: Reviewed and stable  Last Vitals:  Vitals Value Taken Time  BP    Temp    Pulse    Resp    SpO2      Last Pain:  Vitals:   12/06/17 0637  TempSrc: Oral  PainSc: 0-No pain         Complications: No apparent anesthesia complications

## 2017-12-07 ENCOUNTER — Other Ambulatory Visit: Payer: Self-pay | Admitting: Gastroenterology

## 2017-12-07 ENCOUNTER — Encounter (HOSPITAL_COMMUNITY): Payer: Self-pay | Admitting: Gastroenterology

## 2017-12-12 ENCOUNTER — Ambulatory Visit (INDEPENDENT_AMBULATORY_CARE_PROVIDER_SITE_OTHER): Payer: Medicare HMO | Admitting: Cardiology

## 2017-12-12 ENCOUNTER — Encounter: Payer: Self-pay | Admitting: Cardiology

## 2017-12-12 VITALS — BP 147/73 | HR 104 | Ht 65.0 in | Wt 212.8 lb

## 2017-12-12 DIAGNOSIS — E785 Hyperlipidemia, unspecified: Secondary | ICD-10-CM

## 2017-12-12 DIAGNOSIS — R011 Cardiac murmur, unspecified: Secondary | ICD-10-CM | POA: Diagnosis not present

## 2017-12-12 DIAGNOSIS — I10 Essential (primary) hypertension: Secondary | ICD-10-CM | POA: Diagnosis not present

## 2017-12-12 DIAGNOSIS — R079 Chest pain, unspecified: Secondary | ICD-10-CM

## 2017-12-12 DIAGNOSIS — M7989 Other specified soft tissue disorders: Secondary | ICD-10-CM | POA: Insufficient documentation

## 2017-12-12 DIAGNOSIS — I2510 Atherosclerotic heart disease of native coronary artery without angina pectoris: Secondary | ICD-10-CM | POA: Diagnosis not present

## 2017-12-12 DIAGNOSIS — R0989 Other specified symptoms and signs involving the circulatory and respiratory systems: Secondary | ICD-10-CM | POA: Diagnosis not present

## 2017-12-12 HISTORY — DX: Other specified soft tissue disorders: M79.89

## 2017-12-12 MED ORDER — FUROSEMIDE 20 MG PO TABS: ORAL_TABLET | ORAL | 0 refills | Status: DC

## 2017-12-12 MED FILL — FUROSEMIDE 20 MG TABS: 20 | 30 days supply | Qty: 30 | Fill #0

## 2017-12-12 NOTE — Assessment & Plan Note (Signed)
Follow-up in your carotid Dopplers.  Need to follow the vertebral flow on one side and the moderate carotid disease.  Continue risk factor modification.

## 2017-12-12 NOTE — Assessment & Plan Note (Signed)
she is on high-dose simvastatin and lipids are relatively well controlled, but not as well-controlled as they could be.  May consider switching from simvastatin to atorvastatin or rosuvastatin for better control and allow for lower dosing.

## 2017-12-12 NOTE — Assessment & Plan Note (Signed)
Blood pressure has been relatively well controlled up until today.  Slightly elevated but this is not usually an issue for her.  If she does have persistent borderline or elevated blood pressures, would recommend adding beta-blocker based on her tachycardia, but would use a more beta 1 selective agent.

## 2017-12-12 NOTE — Assessment & Plan Note (Signed)
She commented about having some mild swelling that is off and on.  She does not seem to be very interested in taking any medication, however since she is having some orthopnea symptoms as well, I  offered her a prescription for as needed Lasix 20 mg daily.  We will only give her 1 month supply to see if she uses it.

## 2017-12-12 NOTE — Assessment & Plan Note (Signed)
Previously documented as having mild aortic stenosis, however most recent echo does not say anything about aortic stenosis.  I suspect there is at least aortic sclerosis and may be even potentially mild stenosis, but I think the murmur is more related to her anemia as it is somewhat lower today than it was last visit.

## 2017-12-12 NOTE — Patient Instructions (Addendum)
MEDICATION INSTRUCTION  FOR THE NEXT MONTH ---FUROSEMIDE 20 MG  - TAKE ONE DAILY AS NEEDED IF SWELLING , SHORTNESS OF BREATH. ( ONLY FOR ONE MONTH)   TEST [address] IN FEB 2020 Your physician has requested that you have a carotid duplex. This test is an ultrasound of the carotid arteries in your neck. It looks at blood flow through these arteries that supply the brain with blood. Allow one hour for this exam. There are no restrictions or special instructions.    Your physician wants you to follow-up in [place].You will receive a reminder letter in the mail two months in advance. If you don't receive a letter, please call our office to schedule the follow-up appointment.

## 2017-12-12 NOTE — Assessment & Plan Note (Addendum)
As indicated in my original note, symptoms were variable and most likely atypical for any anginal symptoms.  She has had several ischemic evaluations all of which have been negative. She could have microvascular disease, but more likely this is musculoskeletal versus GERD.

## 2017-12-12 NOTE — Progress Notes (Signed)
PCP: Glendale Chard, MD  Clinic Note: Chief Complaint  Patient presents with  . Follow-up    pt states feeling SOB a lot ; echo, coronary CTA and carotid Doppler follow-up    HPI: Peggy [name] is a 72 y.o. female with a PMH below who presents today for 2 month /fu - HTN, HLD & Fam Hx CAD with Cardiac Murmur.  Normal Coronaries by Cath x 2..  Peggy [name] was last seen on Oct 11 2017 - with c/o CP. Noted to have a murmur   Recent Hospitalizations: n/a  Studies Personally Reviewed - (if available, images/films reviewed: From Epic Chart or Care Everywhere)  2D Echo 10/19/17:  EF 60-65%, mild LVH. No RWMA. NO significant valvular disease.  Carotids  10/2017: Carotid Dopplers: 40-59% stenosis on the right internal carotid, less than 40% stenosis in the left. Both vertebral arteries show normal flow. Left subclavian artery is normal, but there is some flow disturbance in the right subclavian artery. -Both the right subclavian artery flow disturbance and the moderate disease in the right internal carotid could be explanation for the bruit  Coronary Calcium Score & CTA: Coronary calcium score 131 (moderate risk).  Moderate proximal LAD & LCx stenosis --> LOW RISK STUDY.  No evidence to suspect cardiac related chest pain  Interval History: Peggy [name] returns today noting that since I last saw her she has had a GI evaluation where she was found to have some evidence of GI bleed.  She has follow-up enteroscopy scheduled.  She is been told to stop her aspirin which makes sense --she has not however noted any melena or hematochezia..  Interestingly however she has noted off and on chest discomfort which she thought was GERD related but was responsive to aspirin.  This is similar symptoms though she has had prior to this most recent evaluation.  Additionally she notes persistent dyspnea on exertion which she says is persistent but she then gets out and does her exercise.  It is hard to really get a good  story from her.  She seems to just not be forthcoming with yes or no answers.  She says she occasionally feels some palpitations, but nothing that worries her.  She is feels her heart rate going fast.  No dizziness or lightheadedness, wooziness or dyspnea.  She also states that she sleeps with 2 pillows but denies any PND.  Off and on below the knee edema. No weakness or syncope/near syncope. No TIA/amaurosis fugax symptoms. No claudication.  ROS: A comprehensive was performed. Review of Systems  HENT: Negative for congestion and nosebleeds.   Respiratory: Negative for cough and wheezing.   Gastrointestinal: Positive for heartburn.  Genitourinary: Negative for hematuria.  Musculoskeletal: Positive for back pain and joint pain (Knees and shoulders).  Neurological: Positive for dizziness (Sometimes positional).  Psychiatric/Behavioral: Negative.    I have reviewed and (if needed) personally updated the patient's problem list, medications, allergies, past medical and surgical history, social and family history.   Past Medical History:  Diagnosis Date  . Anxiety   . Arthritis    "all over"  . Asthma   . Bladder cancer ([place])   . Chronic bronchitis ([place])    'get it q yr"  . Chronic lower back pain   . Daily headache   . Deafness in left ear   . Depression   . DVT (deep venous thrombosis) ([place]) 2009/2010   "probably left leg"   . GERD (gastroesophageal reflux disease)   .  History of cardiac catheterization 2005, 2012   Both catheterizations for "anginal symptoms "showed angiographically normal coronary arteries. -->  Negative Myoview June 2016.  Coronary calcification noted on chest CT  . History of kidney stones   . Hypercholesteremia   . Hypertension   . Nummular eczema   . Skin cancer    "right abdomen"    Past Surgical History:  Procedure Laterality Date  . ABDOMINAL HYSTERECTOMY  1980's  . APPENDECTOMY Right   . BACK SURGERY     x 2, lower  . BLADDER TUMOR EXCISION      . BREAST BIOPSY Right   . BREAST LUMPECTOMY    . CAROTID ARTERY DOPPLERS   10/2017   Carotid Dopplers: 40-59% stenosis on the Right Internal Carotid, < 40% stenosis in the Left. Both vertebral arteries show normal flow. Left subclavian artery is normal, but there is some flow disturbance in the right subclavian artery.  . COLONOSCOPY WITH PROPOFOL N/A 02/26/2015   Procedure: COLONOSCOPY WITH PROPOFOL;  Surgeon: Carol Ada, MD;  Location: [facility];  Service: Endoscopy;  Laterality: N/A;  . COLONOSCOPY WITH PROPOFOL N/A 12/06/2017   Procedure: COLONOSCOPY WITH PROPOFOL;  Surgeon: Juanita Craver, MD;  Location: [facility];  Service: Endoscopy;  Laterality: N/A;  . CORONARY CALCIUM SCORE & CT ANGIOGRAM  11/2017   Coronary calcium score 131 (moderate risk).  Moderate proximal LAD & LCx stenosis --> LOW RISK STUDY.  No evidence to suspect cardiac related chest pain --> correlates with cardiac catheterizations  . ESOPHAGOGASTRODUODENOSCOPY Left 02/20/2015   Procedure: ESOPHAGOGASTRODUODENOSCOPY (EGD);  Surgeon: Milus Banister, MD;  Location: [facility];  Service: Endoscopy;  Laterality: Left;  . ESOPHAGOGASTRODUODENOSCOPY (EGD) WITH PROPOFOL N/A 12/06/2017   Procedure: ESOPHAGOGASTRODUODENOSCOPY (EGD) WITH PROPOFOL;  Surgeon: Juanita Craver, MD;  Location: [facility];  Service: Endoscopy;  Laterality: N/A;  . JOINT REPLACEMENT    . KNEE ARTHROSCOPY Right ~ 2000  . LEFT HEART CATH AND CORONARY ANGIOGRAPHY  11/'05; 2/'12   Angiographically normal coronary arteries  . LUMBAR DISC SURGERY  1980's X 1; ~ 2002  . MEDIAL PARTIAL KNEE REPLACEMENT Right 2001  . NM MYOVIEW LTD  02/2015   EF 65%.  No reversible ischemia.  Possible scar along the cardiac apex and anteroseptal wall.  (Mild severity, medium-sized defect in the apex anteroseptal wall noted on both resting and stress images).  No inducible ischemia.  Read as LOW RISK  . PARTIAL KNEE ARTHROPLASTY Left 2009  . REPLACEMENT TOTAL KNEE BILATERAL  Bilateral   . REVISION TOTAL KNEE ARTHROPLASTY Left   . SKIN CANCER EXCISION Right    "abdomen"  . TRANSTHORACIC ECHOCARDIOGRAM  06/'16; 2/'19    A) mod Conc LVH. EF 60-65%.  No RWMA. GR 1 D. Mild Ao Stenosis. Mild LA dilation.;  B) EF 60-65%.  Mild LVH.  No or W MA.  No significant valve disease.  Aortic stenosis not noted     Current Meds  Medication Sig  . acetaminophen (TYLENOL) 500 MG tablet Take 1,000 mg by mouth every 8 (eight) hours as needed for moderate pain or headache.   . diazepam (VALIUM) 5 MG tablet Take 5 mg by mouth every 6 (six) hours as needed for anxiety or muscle spasms.  . diphenhydrAMINE (ALLERGY RELIEF) 25 mg capsule Take 25 mg by mouth daily.  . divalproex (DEPAKOTE ER) 500 MG 24 hr tablet Take 1,000 mg by mouth at bedtime as needed (depression).   Marland Kitchen FLUoxetine (PROZAC) 40 MG capsule Take 40 mg  by mouth daily as needed (depression).  . simvastatin (ZOCOR) 80 MG tablet Take 80 mg by mouth at bedtime.  . trandolapril (MAVIK) 2 MG tablet Take 2 mg by mouth daily.  . valACYclovir (VALTREX) 1000 MG tablet Take 500 mg by mouth 3 (three) times daily as needed (for outbreak).     Allergies  Allergen Reactions  . Penicillins Shortness Of Breath, Itching, Swelling, Rash and Other (See Comments)    Has patient had a PCN reaction causing immediate rash, facial/tongue/throat swelling, SOB or lightheadedness with hypotension: Yes Has patient had a PCN reaction causing severe rash involving mucus membranes or skin necrosis: No Has patient had a PCN reaction that required hospitalization: Yes Has patient had a PCN reaction occurring within the last 10 years: No If all of the above answers are "NO", then may proceed with Cephalosporin use.   . Azithromycin Itching, Swelling and Rash  . Levaquin [Levofloxacin In D5w] Hives, Itching, Swelling and Rash  . Clindamycin/Lincomycin Hives  . Erythromycin Base Itching  . Keflex [Cephalexin] Itching and Rash  . Sulfa Antibiotics  Itching and Rash  . Tetracyclines & Related Itching and Rash    Social History   Tobacco Use  . Smoking status: Never Smoker  . Smokeless tobacco: Never Used  Substance Use Topics  . Alcohol use: No  . Drug use: No   Social History   Social History Narrative   Relocated from [place]   She is currently "'.  She lives with her son (who is autistic).  She herself is disabled, and not working.   She never smoked and never drank alcohol.   She enjoys walking 7 days a week.    family history includes Cancer in her mother and sister; Heart attack in her father, sister, and sister; Heart failure in her mother, sister, and sister; Hypertension in her father and mother.  Wt Readings from Last 3 Encounters:  12/12/17 212 lb 12.8 oz (96.5 kg)  12/06/17 214 lb (97.1 kg)  10/11/17 214 lb (97.1 kg)    PHYSICAL EXAM BP (!) 147/73   Pulse (!) 104   Ht 5\' 5"  (1.651 m)   Wt 212 lb 12.8 oz (96.5 kg)   SpO2 97%   BMI 35.41 kg/m  --> By the time of my exam, heart rate was in the 80s Physical Exam  Constitutional: She is oriented to person, place, and time. She appears well-developed and well-nourished. No distress.  Walks with a cane.  Well-groomed  Neck: No hepatojugular reflux and no JVD present. Carotid bruit is present (Bilateral).  Cardiovascular: Normal rate, regular rhythm, intact distal pulses and normal pulses.  Occasional extrasystoles are present. PMI is not displaced. Exam reveals no gallop and no friction rub.  Murmur heard.  Medium-pitched harsh crescendo-decrescendo midsystolic murmur is present with a grade of 1/6 at the upper right sternal border radiating to the neck. Pulmonary/Chest: Effort normal and breath sounds normal. No respiratory distress. She has no wheezes. She has no rales.  Abdominal: Soft. Bowel sounds are normal. She exhibits no distension. There is no tenderness. There is no rebound.  Musculoskeletal: Normal range of motion. She exhibits no edema (Trivial).   Neurological: She is alert and oriented to person, place, and time.  Psychiatric: She has a normal mood and affect. Her behavior is normal. Judgment and thought content normal.  Nursing note and vitals reviewed.   Adult ECG Report n/a  Other studies Reviewed: Additional studies/ records that were reviewed today include:  Recent Labs:    CBC Latest Ref Rng & Units 09/04/2017 12/25/2015 02/19/2015  WBC 4.0 - 10.5 K/uL 9.7 12.7(H) 9.6  Hemoglobin 12.0 - 15.0 g/dL 9.7(L) 11.9(L) 7.8(L)  Hematocrit 36.0 - 46.0 % 32.6(L) 37.5 26.7(L)  Platelets 150 - 400 K/uL 371 312 331    ASSESSMENT / PLAN: Problem List Items Addressed This Visit    Systolic ejection murmur (Chronic)    Previously documented as having mild aortic stenosis, however most recent echo does not say anything about aortic stenosis.  I suspect there is at least aortic sclerosis and may be even potentially mild stenosis, but I think the murmur is more related to her anemia as it is somewhat lower today than it was last visit.      Leg swelling    She commented about having some mild swelling that is off and on.  She does not seem to be very interested in taking any medication, however since she is having some orthopnea symptoms as well, I  offered her a prescription for as needed Lasix 20 mg daily.  We will only give her 1 month supply to see if she uses it.      Hyperlipidemia with target LDL less than 100 (Chronic)     she is on high-dose simvastatin and lipids are relatively well controlled, but not as well-controlled as they could be.  May consider switching from simvastatin to atorvastatin or rosuvastatin for better control and allow for lower dosing.      Relevant Medications   furosemide (LASIX) 20 MG tablet   Essential hypertension (Chronic)    Blood pressure has been relatively well controlled up until today.  Slightly elevated but this is not usually an issue for her.  If she does have persistent borderline or elevated  blood pressures, would recommend adding beta-blocker based on her tachycardia, but would use a more beta 1 selective agent.      Relevant Medications   furosemide (LASIX) 20 MG tablet   Coronary artery calcification seen on CAT scan - Primary (Chronic)    Most likely non-anginal chest pain and no significant lesions noted on coronary CTA (this is in conjunction with a nonischemic Myoview in 2016 and 2 relatively normal cardiac catheterizations in 2012 and 2005.  Justifiably her coronary calcium score is 136 which is intermediate risk, but there is no obstructive lesions. This would simply mean that would recommend LDL potentially closer to 70, but is currently less than 100 which is acceptable on current dose of statin.   Also continue adequate blood pressure and glycemic control.      Relevant Medications   furosemide (LASIX) 20 MG tablet   Chest pain with low risk for cardiac etiology (Chronic)    As indicated in my original note, symptoms were variable and most likely atypical for any anginal symptoms.  She has had several ischemic evaluations all of which have been negative. She could have microvascular disease, but more likely this is musculoskeletal versus GERD.      Bilateral carotid bruits (Chronic)    Follow-up in your carotid Dopplers.  Need to follow the vertebral flow on one side and the moderate carotid disease.  Continue risk factor modification.         I spent a total of 58minutes with the patient and chart review. >  50% of the time was spent in direct patient consultation.   Current medicines are reviewed at length with the patient today.  (+/- concerns) told  to stop ASA by GI MD The following changes have been made:  Good Plan. Added low dose PRN Lasix for ?orhtopnea &mild edema.  Patient Instructions  MEDICATION INSTRUCTION  FOR THE NEXT MONTH ---FUROSEMIDE 20 MG  - TAKE ONE DAILY AS NEEDED IF SWELLING , SHORTNESS OF BREATH. ( ONLY FOR ONE  MONTH)   TEST [address] IN FEB 2020 Your physician has requested that you have a carotid duplex. This test is an ultrasound of the carotid arteries in your neck. It looks at blood flow through these arteries that supply the brain with blood. Allow one hour for this exam. There are no restrictions or special instructions.    Your physician wants you to follow-up in [place].You will receive a reminder letter in the mail two months in advance. If you don't receive a letter, please call our office to schedule the follow-up appointment.      Studies Ordered:   No orders of the defined types were placed in this encounter.     Glenetta Hew, M.D., M.S. Interventional Cardiologist   Pager # [phone] Phone # [phone] [address] [place], [place] [address]   Thank you for choosing [facility]!!

## 2017-12-12 NOTE — Assessment & Plan Note (Addendum)
Most likely non-anginal chest pain and no significant lesions noted on coronary CTA (this is in conjunction with a nonischemic Myoview in 2016 and 2 relatively normal cardiac catheterizations in 2012 and 2005.  Justifiably her coronary calcium score is 136 which is intermediate risk, but there is no obstructive lesions. This would simply mean that would recommend LDL potentially closer to 70, but is currently less than 100 which is acceptable on current dose of statin.   Also continue adequate blood pressure and glycemic control.

## 2017-12-14 ENCOUNTER — Encounter (HOSPITAL_COMMUNITY)
Admission: RE | Disposition: A | Discharge status: Home / Self Care | Payer: Self-pay | Source: Ambulatory Visit | Attending: Gastroenterology

## 2017-12-14 ENCOUNTER — Other Ambulatory Visit: Payer: Self-pay

## 2017-12-14 ENCOUNTER — Encounter (HOSPITAL_COMMUNITY): Payer: Self-pay | Admitting: *Deleted

## 2017-12-14 ENCOUNTER — Ambulatory Visit (HOSPITAL_COMMUNITY)
Admission: RE | Admit: 2017-12-14 | Discharge: 2017-12-14 | Disposition: A | Discharge status: Home / Self Care | Payer: Medicare HMO | Source: Ambulatory Visit | Attending: Gastroenterology | Admitting: Gastroenterology

## 2017-12-14 DIAGNOSIS — Z86010 Personal history of colonic polyps: Secondary | ICD-10-CM | POA: Insufficient documentation

## 2017-12-14 DIAGNOSIS — K5730 Diverticulosis of large intestine without perforation or abscess without bleeding: Secondary | ICD-10-CM | POA: Insufficient documentation

## 2017-12-14 DIAGNOSIS — R1033 Periumbilical pain: Secondary | ICD-10-CM | POA: Diagnosis present

## 2017-12-14 DIAGNOSIS — Z880 Allergy status to penicillin: Secondary | ICD-10-CM | POA: Diagnosis not present

## 2017-12-14 DIAGNOSIS — Z8551 Personal history of malignant neoplasm of bladder: Secondary | ICD-10-CM | POA: Diagnosis not present

## 2017-12-14 DIAGNOSIS — Z882 Allergy status to sulfonamides status: Secondary | ICD-10-CM | POA: Insufficient documentation

## 2017-12-14 DIAGNOSIS — Z96653 Presence of artificial knee joint, bilateral: Secondary | ICD-10-CM | POA: Diagnosis not present

## 2017-12-14 DIAGNOSIS — D509 Iron deficiency anemia, unspecified: Secondary | ICD-10-CM | POA: Diagnosis not present

## 2017-12-14 DIAGNOSIS — K5900 Constipation, unspecified: Secondary | ICD-10-CM | POA: Diagnosis not present

## 2017-12-14 DIAGNOSIS — Z881 Allergy status to other antibiotic agents status: Secondary | ICD-10-CM | POA: Diagnosis not present

## 2017-12-14 DIAGNOSIS — K31819 Angiodysplasia of stomach and duodenum without bleeding: Secondary | ICD-10-CM | POA: Diagnosis not present

## 2017-12-14 DIAGNOSIS — I2510 Atherosclerotic heart disease of native coronary artery without angina pectoris: Secondary | ICD-10-CM | POA: Insufficient documentation

## 2017-12-14 DIAGNOSIS — K269 Duodenal ulcer, unspecified as acute or chronic, without hemorrhage or perforation: Secondary | ICD-10-CM | POA: Diagnosis not present

## 2017-12-14 DIAGNOSIS — Z85828 Personal history of other malignant neoplasm of skin: Secondary | ICD-10-CM | POA: Insufficient documentation

## 2017-12-14 DIAGNOSIS — J45909 Unspecified asthma, uncomplicated: Secondary | ICD-10-CM | POA: Diagnosis not present

## 2017-12-14 DIAGNOSIS — I10 Essential (primary) hypertension: Secondary | ICD-10-CM | POA: Diagnosis not present

## 2017-12-14 HISTORY — PX: ENTEROSCOPY: SHX5533

## 2017-12-14 HISTORY — PX: HOT HEMOSTASIS: SHX5433

## 2017-12-14 SURGERY — ENTEROSCOPY
Anesthesia: Moderate Sedation

## 2017-12-14 MED ORDER — DIPHENHYDRAMINE HCL 50 MG/ML IJ SOLN
INTRAMUSCULAR | Status: AC
  Filled 2017-12-14: qty 1

## 2017-12-14 MED ORDER — MIDAZOLAM HCL 5 MG/ML IJ SOLN
INTRAMUSCULAR | Status: AC
  Filled 2017-12-14: qty 2

## 2017-12-14 MED ORDER — FENTANYL CITRATE (PF) 100 MCG/2ML IJ SOLN
INTRAMUSCULAR | Status: DC | PRN
  Administered 2017-12-14 (×4): 25 ug via INTRAVENOUS

## 2017-12-14 MED ORDER — FENTANYL CITRATE (PF) 100 MCG/2ML IJ SOLN
INTRAMUSCULAR | Status: AC
  Filled 2017-12-14: qty 2

## 2017-12-14 MED ORDER — SODIUM CHLORIDE 0.9 % IV SOLN
INTRAVENOUS | Status: DC
  Administered 2017-12-14: 1000 mL via INTRAVENOUS
  Administered 2017-12-14: 500 mL via INTRAVENOUS

## 2017-12-14 MED ORDER — BUTAMBEN-TETRACAINE-BENZOCAINE 2-2-14 % EX AERO
INHALATION_SPRAY | CUTANEOUS | Status: DC | PRN
  Administered 2017-12-14: 1 via TOPICAL

## 2017-12-14 MED ORDER — MIDAZOLAM HCL 10 MG/2ML IJ SOLN
INTRAMUSCULAR | Status: DC | PRN
  Administered 2017-12-14: 1 mg via INTRAVENOUS
  Administered 2017-12-14: 2 mg via INTRAVENOUS
  Administered 2017-12-14: 1 mg via INTRAVENOUS
  Administered 2017-12-14: 2 mg via INTRAVENOUS
  Administered 2017-12-14: 1 mg via INTRAVENOUS

## 2017-12-14 NOTE — Op Note (Signed)
[facility] Patient Name: Peggy [name] Procedure Date: 12/14/2017 MRN: [id number] Attending MD: Carol Ada , MD Date of Birth: 1946/07/31 CSN: [id number] Age: 72 Admit Type: Outpatient Procedure:                Small bowel enteroscopy Indications:              Iron deficiency anemia Providers:                Carol Ada, MDMarilyn Everhart, Technician,                            Cleda Daub, RN Referring MD:              Medicines:                Fentanyl 100 micrograms IV, Midazolam 7 mg IV Complications:            No immediate complications. Estimated Blood Loss:     Estimated blood loss was minimal. Procedure:                Pre-Anesthesia Assessment:                           - Prior to the procedure, a History and Physical                            was performed, and patient medications and                            allergies were reviewed. The patient's tolerance of                            previous anesthesia was also reviewed. The risks                            and benefits of the procedure and the sedation                            options and risks were discussed with the patient.                            All questions were answered, and informed consent                            was obtained. Prior Anticoagulants: The patient has                            taken no previous anticoagulant or antiplatelet                            agents. ASA Grade Assessment: III - A patient with                            severe systemic disease. After reviewing the risks  and benefits, the patient was deemed in                            satisfactory condition to undergo the procedure.                           - Sedation was administered by an endoscopy nurse.                            The sedation level attained was moderate.                           After obtaining informed consent, the endoscope was   passed under direct vision. Throughout the                            procedure, the patient's blood pressure, pulse, and                            oxygen saturations were monitored continuously. The                            EC-[id number] ([id number]) scope was introduced through                            the mouth and advanced to the proximal jejunum. The                            small bowel enteroscopy was accomplished without                            difficulty. The patient tolerated the procedure                            well. Scope In: Scope Out: Findings:      The esophagus was normal.      The stomach was normal. Biopsies were taken with a cold forceps for       histology.      Multiple angiodysplastic lesions with no bleeding were found in the       second portion of the duodenum and in the third portion of the duodenum.       Coagulation for tissue destruction using monopolar probe was successful.      Multiple non-bleeding cratered duodenal ulcers with no stigmata of       bleeding were found in the duodenal bulb. The largest lesion was 5 mm in       largest dimension.      Multiple angiodysplastic lesions with no bleeding were found in the       proximal jejunum. Coagulation for tissue destruction using monopolar       probe was successful. Estimated blood loss was minimal.      The up/down wheel broke towards the end of the procedure, but using       torque on the scope as well as the left/right wheel allowed for good       control of the scope tip. Impression:               -  Normal esophagus.                           - Normal stomach. Biopsied.                           - Multiple non-bleeding angiodysplastic lesions in                            the duodenum. Treated with a monopolar probe.                           - Multiple non-bleeding duodenal ulcers with no                            stigmata of bleeding.                           - Multiple non-bleeding  angiodysplastic lesions in                            the jejunum. Treated with a monopolar probe. Recommendation:           - Patient has a contact number available for                            emergencies. The signs and symptoms of potential                            delayed complications were discussed with the                            patient. Return to normal activities tomorrow.                            Written discharge instructions were provided to the                            patient.                           - Resume regular diet.                           - Await pathology results.                           - Return to GI clinic in 4 weeks. Procedure Code(s):        --- Professional ---                           [phone], Small intestinal endoscopy, enteroscopy                            beyond second portion of duodenum, not including  ileum; with ablation of tumor(s), polyp(s), or                            other lesion(s) not amenable to removal by hot                            biopsy forceps, bipolar cautery or snare technique                           [id number], Small intestinal endoscopy, enteroscopy                            beyond second portion of duodenum, not including                            ileum; with biopsy, single or multiple Diagnosis Code(s):        --- Professional ---                           K31.819, Angiodysplasia of stomach and duodenum                            without bleeding                           K26.9, Duodenal ulcer, unspecified as acute or                            chronic, without hemorrhage or perforation                           K55.20, Angiodysplasia of colon without hemorrhage                           D50.9, Iron deficiency anemia, unspecified CPT copyright 2017 [employer]. All rights reserved. The codes documented in this report are preliminary and upon coder review may  be revised  to meet current compliance requirements. Carol Ada, MD Carol Ada, MD 12/14/2017 9:10:23 AM This report has been signed electronically. Number of Addenda: 0

## 2017-12-14 NOTE — Discharge Instructions (Signed)

## 2017-12-14 NOTE — H&P (Signed)
Peggy [name] HPI: This 72 year old black female presents to the office for further evaluation of abdominal pain and iron deficiency anemia. She has periumbilical pain that started September 04, 2017. The pain radiates to the LLQ. The severity of the pain has reached 7/10 at its worst. She takes Tylenol to help relieve the pain. She had a CBC with a hemoglobin of 9.7 gm done on 09/04/17. She has a history of iron deficiency anemia but does not take any iron supplements. She has been fatigued and weak. She has problems with constipation and has gone upto 3-4 days without a BM. She takes an laxative on a PRN basis for relief. She usually has 1 BM per day. She had episodes of bright red blood on the toilet tissue and in the stool since the middle of January 2019. She has acid reflux. She has good appetite. She has lost 15 pounds since 02/2017. She denies having any complaints of nausea, vomiting, dysphagia or odynophagia. She denies having a family history of colon cancer, celiac sprue or IBD. Her last colonoscopy done on 02/26/2015 revealed a few scattered diverticula and a cecal arteriovenous malformation; the exam was otherwise normal. She had tubular adenomas removed in 2001 and 2007.   Past Medical History:  Diagnosis Date  . Anxiety   . Arthritis    "all over"  . Asthma   . Bladder cancer ([place])   . Chronic bronchitis ([place])    'get it q yr"  . Chronic lower back pain   . Daily headache   . Deafness in left ear   . Depression   . DVT (deep venous thrombosis) ([place]) 2009/2010   "probably left leg"   . GERD (gastroesophageal reflux disease)   . History of cardiac catheterization 2005, 2012   Both catheterizations for "anginal symptoms "showed angiographically normal coronary arteries. -->  Negative Myoview June 2016.  Coronary calcification noted on chest CT  . History of kidney stones   . Hypercholesteremia   . Hypertension   . Nummular eczema   . Skin cancer    "right abdomen"    Past  Surgical History:  Procedure Laterality Date  . ABDOMINAL HYSTERECTOMY  1980's  . APPENDECTOMY Right   . BACK SURGERY     x 2, lower  . BLADDER TUMOR EXCISION    . BREAST BIOPSY Right   . BREAST LUMPECTOMY    . CAROTID ARTERY DOPPLERS   10/2017   Carotid Dopplers: 40-59% stenosis on the Right Internal Carotid, < 40% stenosis in the Left. Both vertebral arteries show normal flow. Left subclavian artery is normal, but there is some flow disturbance in the right subclavian artery.  . COLONOSCOPY WITH PROPOFOL N/A 02/26/2015   Procedure: COLONOSCOPY WITH PROPOFOL;  Surgeon: Carol Ada, MD;  Location: [facility];  Service: Endoscopy;  Laterality: N/A;  . COLONOSCOPY WITH PROPOFOL N/A 12/06/2017   Procedure: COLONOSCOPY WITH PROPOFOL;  Surgeon: Juanita Craver, MD;  Location: [facility];  Service: Endoscopy;  Laterality: N/A;  . CORONARY CALCIUM SCORE & CT ANGIOGRAM  11/2017   Coronary calcium score 131 (moderate risk).  Moderate proximal LAD & LCx stenosis --> LOW RISK STUDY.  No evidence to suspect cardiac related chest pain --> correlates with cardiac catheterizations  . ESOPHAGOGASTRODUODENOSCOPY Left 02/20/2015   Procedure: ESOPHAGOGASTRODUODENOSCOPY (EGD);  Surgeon: Milus Banister, MD;  Location: [place] [facility];  Service: Endoscopy;  Laterality: Left;  . ESOPHAGOGASTRODUODENOSCOPY (EGD) WITH PROPOFOL N/A 12/06/2017   Procedure: ESOPHAGOGASTRODUODENOSCOPY (EGD) WITH PROPOFOL;  Surgeon: Juanita Craver, MD;  Location: [facility];  Service: Endoscopy;  Laterality: N/A;  . JOINT REPLACEMENT    . KNEE ARTHROSCOPY Right ~ 2000  . LEFT HEART CATH AND CORONARY ANGIOGRAPHY  11/'05; 2/'12   Angiographically normal coronary arteries  . LUMBAR DISC SURGERY  1980's X 1; ~ 2002  . MEDIAL PARTIAL KNEE REPLACEMENT Right 2001  . NM MYOVIEW LTD  02/2015   EF 65%.  No reversible ischemia.  Possible scar along the cardiac apex and anteroseptal wall.  (Mild severity, medium-sized defect in the apex anteroseptal  wall noted on both resting and stress images).  No inducible ischemia.  Read as LOW RISK  . PARTIAL KNEE ARTHROPLASTY Left 2009  . REPLACEMENT TOTAL KNEE BILATERAL Bilateral   . REVISION TOTAL KNEE ARTHROPLASTY Left   . SKIN CANCER EXCISION Right    "abdomen"  . TRANSTHORACIC ECHOCARDIOGRAM  06/'16; 2/'19    A) mod Conc LVH. EF 60-65%.  No RWMA. GR 1 D. Mild Ao Stenosis. Mild LA dilation.;  B) EF 60-65%.  Mild LVH.  No or W MA.  No significant valve disease.  Aortic stenosis not noted    Family History  Problem Relation Age of Onset  . Heart failure Mother   . Cancer Mother   . Hypertension Mother   . Hypertension Father   . Heart attack Father        Noted on cardiology intake form as "massive heart attack" at age 65  . Heart failure Sister   . Heart attack Sister        Not listed on her cardiology intake form  . Heart failure Sister   . Heart attack Sister        Not listed on her cardiology intake  . Cancer Sister     Social History:  reports that she has never smoked. She has never used smokeless tobacco. She reports that she does not drink alcohol or use drugs.  Allergies:  Allergies  Allergen Reactions  . Penicillins Shortness Of Breath, Itching, Swelling, Rash and Other (See Comments)    Has patient had a PCN reaction causing immediate rash, facial/tongue/throat swelling, SOB or lightheadedness with hypotension: Yes Has patient had a PCN reaction causing severe rash involving mucus membranes or skin necrosis: No Has patient had a PCN reaction that required hospitalization: Yes Has patient had a PCN reaction occurring within the last 10 years: No If all of the above answers are "NO", then may proceed with Cephalosporin use.   . Azithromycin Itching, Swelling and Rash  . Levaquin [Levofloxacin In D5w] Hives, Itching, Swelling and Rash  . Clindamycin/Lincomycin Hives  . Erythromycin Base Itching  . Keflex [Cephalexin] Itching and Rash  . Sulfa Antibiotics Itching and  Rash  . Tetracyclines & Related Itching and Rash    Medications:  Scheduled:  Continuous: . sodium chloride 1,000 mL (12/14/17 0742)    No results found for this or any previous visit (from the past 24 hour(s)).   No results found.  ROS:  As stated above in the HPI otherwise negative.  Blood pressure (!) 152/62, pulse 77, temperature 98.9 F (37.2 C), temperature source Oral, resp. rate 15, height 5\' 5"  (1.651 m), weight 96.2 kg (212 lb), SpO2 99 %.    PE: Gen: NAD, Alert and Oriented HEENT:  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  Breandan People D 12/14/2017, 8:10 AM

## 2017-12-15 ENCOUNTER — Encounter (HOSPITAL_COMMUNITY): Payer: Self-pay | Admitting: Gastroenterology

## 2017-12-18 MED FILL — SIMVASTATIN 80 MG TABLET: 80 | 90 days supply | Qty: 90 | Fill #0

## 2017-12-19 MED FILL — valACYclovir HCL 1 GM TABS: 1 | 90 days supply | Qty: 90 | Fill #2

## 2017-12-20 DIAGNOSIS — Q2733 Arteriovenous malformation of digestive system vessel: Secondary | ICD-10-CM | POA: Diagnosis not present

## 2017-12-20 DIAGNOSIS — K625 Hemorrhage of anus and rectum: Secondary | ICD-10-CM | POA: Diagnosis not present

## 2017-12-20 DIAGNOSIS — K5730 Diverticulosis of large intestine without perforation or abscess without bleeding: Secondary | ICD-10-CM | POA: Diagnosis not present

## 2017-12-28 MED FILL — DIVALPROEX SOD ER 500 MG TA: 500 | 30 days supply | Qty: 60 | Fill #4

## 2017-12-28 MED FILL — FLUoxetine HCL 40 MG CAPS: 40 | 60 days supply | Qty: 60 | Fill #1

## 2018-01-14 ENCOUNTER — Ambulatory Visit (INDEPENDENT_AMBULATORY_CARE_PROVIDER_SITE_OTHER): Payer: Medicare HMO

## 2018-01-14 ENCOUNTER — Ambulatory Visit (INDEPENDENT_AMBULATORY_CARE_PROVIDER_SITE_OTHER): Payer: Medicare HMO | Admitting: Podiatry

## 2018-01-14 DIAGNOSIS — S99911A Unspecified injury of right ankle, initial encounter: Secondary | ICD-10-CM | POA: Diagnosis not present

## 2018-01-14 DIAGNOSIS — S9032XA Contusion of left foot, initial encounter: Secondary | ICD-10-CM

## 2018-01-15 DIAGNOSIS — F411 Generalized anxiety disorder: Secondary | ICD-10-CM | POA: Diagnosis not present

## 2018-01-15 DIAGNOSIS — F330 Major depressive disorder, recurrent, mild: Secondary | ICD-10-CM | POA: Diagnosis not present

## 2018-01-15 MED FILL — diazePAM 5 MG TABS: 5 | 10 days supply | Qty: 30 | Fill #0

## 2018-01-16 NOTE — Progress Notes (Signed)
   HPI: 72 year old female presenting today as a new patient with a chief complaint of pain to the left heel that began about two weeks ago secondary to tripping down stairs. She states she hit the left heel very hard and has had pain and swelling since. She has not done anything for treatment. Walking and bearing weight increases the pain. Patient is here for further evaluation and treatment.   Past Medical History:  Diagnosis Date  . Anxiety   . Arthritis    "all over"  . Asthma   . Bladder cancer ([place])   . Chronic bronchitis ([place])    'get it q yr"  . Chronic lower back pain   . Daily headache   . Deafness in left ear   . Depression   . DVT (deep venous thrombosis) ([place]) 2009/2010   "probably left leg"   . GERD (gastroesophageal reflux disease)   . History of cardiac catheterization 2005, 2012   Both catheterizations for "anginal symptoms "showed angiographically normal coronary arteries. -->  Negative Myoview June 2016.  Coronary calcification noted on chest CT  . History of kidney stones   . Hypercholesteremia   . Hypertension   . Nummular eczema   . Skin cancer    "right abdomen"     Physical Exam: General: The patient is alert and oriented x3 in no acute distress.  Dermatology: Skin is warm, dry and supple bilateral lower extremities. Negative for open lesions or macerations.  Vascular: Palpable pedal pulses bilaterally. No edema or erythema noted. Capillary refill within normal limits.  Neurological: Epicritic and protective threshold grossly intact bilaterally.   Musculoskeletal Exam: Pain with palpation and moderate edema noted to the left plantar heel. Range of motion within normal limits to all pedal and ankle joints bilateral. Muscle strength 5/5 in all groups bilateral.   Radiographic Exam:  Normal osseous mineralization. Joint spaces preserved. No fracture/dislocation/boney destruction. Plantar heel spur noted.   Assessment: 1. Left heel contusion    Plan  of Care:  1. Patient evaluated. X-Rays reviewed.  2. Compression anklet dispensed.  3. Short CAM boot dispensed.  4. Prescription for pain cream to be dispensed from [facility].  5. Patient cannot tolerate oral NSAIDs.  6. Return to clinic in 4 weeks.   Wants her autistic son evaluated at next visit.       Edrick Kins, DPM [facility]  Dr. Edrick Kins, DPM    [address], [place] [address]                Office [phone]  Fax [phone]

## 2018-01-29 MED FILL — DIVALPROEX SOD ER 500 MG TA: 500 | 30 days supply | Qty: 60 | Fill #5

## 2018-02-13 ENCOUNTER — Ambulatory Visit (INDEPENDENT_AMBULATORY_CARE_PROVIDER_SITE_OTHER): Payer: Medicare HMO | Admitting: Podiatry

## 2018-02-13 ENCOUNTER — Encounter: Payer: Self-pay | Admitting: Podiatry

## 2018-02-13 DIAGNOSIS — M722 Plantar fascial fibromatosis: Secondary | ICD-10-CM

## 2018-02-14 ENCOUNTER — Other Ambulatory Visit: Payer: Self-pay | Admitting: Internal Medicine

## 2018-02-14 DIAGNOSIS — Z1231 Encounter for screening mammogram for malignant neoplasm of breast: Secondary | ICD-10-CM

## 2018-02-16 MED FILL — FLUoxetine HCL 40 MG CAPS: 40 | 60 days supply | Qty: 60 | Fill #2

## 2018-02-23 NOTE — Progress Notes (Signed)
   Subjective:   72 year old female presenting today for follow up evaluation of left heel pain. She reports continued pain and states it was worse yesterday than it has been. She does admit to excessive walking recently. She has been using the pain cream from [facility] which has helped alleviate the pain. Wearing the anklet and CAM boot increased the pain. Patient is here for further evaluation and treatment.    Past Medical History:  Diagnosis Date  . Anxiety   . Arthritis    "all over"  . Asthma   . Bladder cancer ([place])   . Chronic bronchitis ([place])    'get it q yr"  . Chronic lower back pain   . Daily headache   . Deafness in left ear   . Depression   . DVT (deep venous thrombosis) ([place]) 2009/2010   "probably left leg"   . GERD (gastroesophageal reflux disease)   . History of cardiac catheterization 2005, 2012   Both catheterizations for "anginal symptoms "showed angiographically normal coronary arteries. -->  Negative Myoview June 2016.  Coronary calcification noted on chest CT  . History of kidney stones   . Hypercholesteremia   . Hypertension   . Nummular eczema   . Skin cancer    "right abdomen"     Objective: Physical Exam General: The patient is alert and oriented x3 in no acute distress.  Dermatology: Skin is warm, dry and supple bilateral lower extremities. Negative for open lesions or macerations bilateral.   Vascular: Dorsalis Pedis and Posterior Tibial pulses palpable bilateral.  Capillary fill time is immediate to all digits.  Neurological: Epicritic and protective threshold intact bilateral.   Musculoskeletal: Tenderness to palpation to the plantar aspect of the left heel along the plantar fascia. All other joints range of motion within normal limits bilateral. Strength 5/5 in all groups bilateral.    Assessment: 1. Plantar fasciitis left foot secondary to injury   Plan of Care:  1. Patient evaluated.    2. Injection of 0.5cc Celestone  soluspan injected into the left plantar fascia.  3. Continue using [employer] pain cream daily.  4. Recommended good shoe gear. Discontinue using CAM boot.  5. Patient cannot tolerate oral NSAIDs.  6. Return to clinic in 8 weeks.     Edrick Kins, DPM [facility]  Dr. Edrick Kins, DPM    [address], [place] [address]                Office [phone]  Fax [phone]

## 2018-03-01 MED FILL — DIVALPROEX SOD ER 500 MG TA: 500 | 30 days supply | Qty: 60 | Fill #0

## 2018-03-01 MED FILL — TRANDOLAPRIL 2 MG TAB: 2 | 90 days supply | Qty: 90 | Fill #1

## 2018-03-04 MED FILL — valACYclovir HCL 1 GM TABS: 1 | 90 days supply | Qty: 90 | Fill #0

## 2018-03-06 DIAGNOSIS — L7211 Pilar cyst: Secondary | ICD-10-CM | POA: Diagnosis not present

## 2018-03-11 DIAGNOSIS — R51 Headache: Secondary | ICD-10-CM | POA: Diagnosis not present

## 2018-03-12 DIAGNOSIS — R51 Headache: Secondary | ICD-10-CM | POA: Diagnosis not present

## 2018-03-13 ENCOUNTER — Ambulatory Visit
Admission: RE | Admit: 2018-03-13 | Discharge: 2018-03-13 | Disposition: A | Discharge status: Home / Self Care | Payer: Medicare HMO | Source: Ambulatory Visit | Attending: Internal Medicine | Admitting: Internal Medicine

## 2018-03-13 DIAGNOSIS — Z1231 Encounter for screening mammogram for malignant neoplasm of breast: Secondary | ICD-10-CM

## 2018-03-20 MED FILL — SIMVASTATIN 80 MG TABLET: 80 | 90 days supply | Qty: 90 | Fill #1

## 2018-04-03 MED FILL — DIVALPROEX SOD ER 500 MG TA: 500 | 30 days supply | Qty: 60 | Fill #1

## 2018-04-10 ENCOUNTER — Ambulatory Visit: Payer: Medicare HMO | Admitting: Podiatry

## 2018-04-16 DIAGNOSIS — F411 Generalized anxiety disorder: Secondary | ICD-10-CM | POA: Diagnosis not present

## 2018-04-16 DIAGNOSIS — F330 Major depressive disorder, recurrent, mild: Secondary | ICD-10-CM | POA: Diagnosis not present

## 2018-05-02 MED FILL — FLUoxetine HCL 40 MG CAPS: 40 | 30 days supply | Qty: 30 | Fill #0

## 2018-05-02 MED FILL — DIVALPROEX SOD ER 500 MG TA: 500 | 30 days supply | Qty: 60 | Fill #2

## 2018-05-03 DIAGNOSIS — F419 Anxiety disorder, unspecified: Secondary | ICD-10-CM | POA: Diagnosis not present

## 2018-05-18 ENCOUNTER — Encounter: Payer: Self-pay | Admitting: Nurse Practitioner

## 2018-05-20 DIAGNOSIS — E559 Vitamin D deficiency, unspecified: Secondary | ICD-10-CM | POA: Diagnosis not present

## 2018-05-20 DIAGNOSIS — Z23 Encounter for immunization: Secondary | ICD-10-CM | POA: Diagnosis not present

## 2018-05-20 DIAGNOSIS — E782 Mixed hyperlipidemia: Secondary | ICD-10-CM | POA: Diagnosis not present

## 2018-05-20 DIAGNOSIS — M25562 Pain in left knee: Secondary | ICD-10-CM | POA: Diagnosis not present

## 2018-05-20 DIAGNOSIS — R51 Headache: Secondary | ICD-10-CM | POA: Diagnosis not present

## 2018-05-20 DIAGNOSIS — I10 Essential (primary) hypertension: Secondary | ICD-10-CM | POA: Diagnosis not present

## 2018-05-30 MED FILL — VIT D2 1.25 MG (50,000 UNIT: 1.25 MG | 28 days supply | Qty: 8 | Fill #0

## 2018-05-30 MED FILL — TRANDOLAPRIL 2 MG TAB: 2 | 90 days supply | Qty: 90 | Fill #2

## 2018-05-30 MED FILL — FLUoxetine HCL 40 MG CAPS: 40 | 30 days supply | Qty: 30 | Fill #1

## 2018-05-30 MED FILL — valACYclovir HCL 1 GM TABS: 1 | 60 days supply | Qty: 90 | Fill #1

## 2018-05-30 MED FILL — DIVALPROEX SOD ER 500 MG TA: 500 | 30 days supply | Qty: 60 | Fill #3

## 2018-06-06 ENCOUNTER — Other Ambulatory Visit: Payer: Self-pay | Admitting: Gastroenterology

## 2018-06-06 DIAGNOSIS — D509 Iron deficiency anemia, unspecified: Secondary | ICD-10-CM | POA: Diagnosis not present

## 2018-06-06 DIAGNOSIS — K5900 Constipation, unspecified: Secondary | ICD-10-CM | POA: Diagnosis not present

## 2018-06-06 DIAGNOSIS — R1033 Periumbilical pain: Secondary | ICD-10-CM

## 2018-06-06 DIAGNOSIS — K5730 Diverticulosis of large intestine without perforation or abscess without bleeding: Secondary | ICD-10-CM | POA: Diagnosis not present

## 2018-06-06 DIAGNOSIS — K219 Gastro-esophageal reflux disease without esophagitis: Secondary | ICD-10-CM | POA: Diagnosis not present

## 2018-06-10 ENCOUNTER — Telehealth: Payer: Self-pay | Admitting: Nurse Practitioner

## 2018-06-10 ENCOUNTER — Other Ambulatory Visit: Payer: Self-pay | Admitting: Nurse Practitioner

## 2018-06-10 DIAGNOSIS — M25562 Pain in left knee: Principal | ICD-10-CM

## 2018-06-10 DIAGNOSIS — G8929 Other chronic pain: Secondary | ICD-10-CM

## 2018-06-10 DIAGNOSIS — M25561 Pain in right knee: Principal | ICD-10-CM

## 2018-06-10 MED ORDER — TRAMADOL HCL 50 MG PO TABS: 50.0000 mg | ORAL_TABLET | Freq: Four times a day (QID) | ORAL | 0 refills | Status: DC | PRN

## 2018-06-10 NOTE — Telephone Encounter (Signed)
Call patient and make aware her form is complete, there is a fax number to return.

## 2018-06-11 MED FILL — traMADol HCL 50 MG TABS: 50 | 5 days supply | Qty: 20 | Fill #0

## 2018-06-13 DIAGNOSIS — F419 Anxiety disorder, unspecified: Secondary | ICD-10-CM | POA: Diagnosis not present

## 2018-06-17 NOTE — Telephone Encounter (Signed)
Patient notified and forms have been faxed on 06/10/18. YRL,RMA

## 2018-06-20 ENCOUNTER — Ambulatory Visit (HOSPITAL_COMMUNITY)
Admission: RE | Admit: 2018-06-20 | Discharge: 2018-06-20 | Disposition: A | Discharge status: Home / Self Care | Payer: Medicare HMO | Source: Ambulatory Visit | Attending: Gastroenterology | Admitting: Gastroenterology

## 2018-06-20 DIAGNOSIS — K760 Fatty (change of) liver, not elsewhere classified: Secondary | ICD-10-CM | POA: Diagnosis not present

## 2018-06-20 DIAGNOSIS — K5730 Diverticulosis of large intestine without perforation or abscess without bleeding: Secondary | ICD-10-CM | POA: Diagnosis not present

## 2018-06-20 DIAGNOSIS — K7689 Other specified diseases of liver: Secondary | ICD-10-CM | POA: Insufficient documentation

## 2018-06-20 DIAGNOSIS — R1033 Periumbilical pain: Secondary | ICD-10-CM | POA: Insufficient documentation

## 2018-06-20 MED ORDER — IOHEXOL 300 MG/ML  SOLN
100.0000 mL | Freq: Once | INTRAMUSCULAR | Status: AC | PRN
  Administered 2018-06-20: 100 mL via INTRAVENOUS

## 2018-06-20 MED ORDER — SODIUM CHLORIDE 0.9 % IJ SOLN
INTRAMUSCULAR | Status: AC
  Filled 2018-06-20: qty 50

## 2018-06-24 MED FILL — VIT D2 1.25 MG (50,000 UNIT: 1.25 MG | 28 days supply | Qty: 8 | Fill #1

## 2018-06-26 NOTE — Telephone Encounter (Signed)
Form for deep freezer refaxed with confirmation to [facility].

## 2018-06-27 MED FILL — DIVALPROEX SOD ER 500 MG TA: 500 | 30 days supply | Qty: 60 | Fill #0

## 2018-06-27 MED FILL — FLUoxetine HCL 40 MG CAPS: 40 | 30 days supply | Qty: 30 | Fill #2

## 2018-07-17 ENCOUNTER — Telehealth: Payer: Self-pay | Admitting: Internal Medicine

## 2018-07-17 NOTE — Telephone Encounter (Signed)
I left a message asking the pt to call me at [phone] to schedule AWV before the end of the year. VDM (DD)

## 2018-07-24 MED FILL — VIT D2 1.25 MG (50,000 UNIT: 1.25 MG | 28 days supply | Qty: 8 | Fill #2

## 2018-07-24 MED FILL — SIMVASTATIN 80 MG TABLET: 80 | 90 days supply | Qty: 90 | Fill #2

## 2018-07-27 ENCOUNTER — Telehealth: Payer: Self-pay | Admitting: Internal Medicine

## 2018-07-27 NOTE — Telephone Encounter (Signed)
I left a message asking the pt to come at 8:30 instead of 9:00 on 09/11/2018 for yearly Medicare questionnaire. VDM (DD)

## 2018-07-29 MED FILL — FLUoxetine HCL 40 MG CAPS: 40 | 30 days supply | Qty: 30 | Fill #3

## 2018-07-29 MED FILL — DIVALPROEX SOD ER 500 MG TA: 500 | 30 days supply | Qty: 60 | Fill #1

## 2018-07-29 NOTE — Telephone Encounter (Signed)
The patient called me back, and we scheduled her AWV for tomorrow 07/30/18 [facility] calendar year).  She also mentioned that she left a message on Monday about paperwork that she needed to be completed.  I explained that she can bring it with her tomorrow but she will only be seeing the nurse for her AWV.  I explained that the provider may or may not be able to complete it while she's there. VDM (DD)

## 2018-07-30 ENCOUNTER — Ambulatory Visit (INDEPENDENT_AMBULATORY_CARE_PROVIDER_SITE_OTHER): Payer: Medicare HMO

## 2018-07-30 VITALS — BP 132/76 | HR 83 | Temp 97.8°F | Ht 65.0 in | Wt 213.0 lb

## 2018-07-30 DIAGNOSIS — Z598 Other problems related to housing and economic circumstances: Secondary | ICD-10-CM

## 2018-07-30 DIAGNOSIS — Z0000 Encounter for general adult medical examination without abnormal findings: Secondary | ICD-10-CM | POA: Diagnosis not present

## 2018-07-30 DIAGNOSIS — Z599 Problem related to housing and economic circumstances, unspecified: Secondary | ICD-10-CM

## 2018-07-30 DIAGNOSIS — E2839 Other primary ovarian failure: Secondary | ICD-10-CM

## 2018-07-30 MED ORDER — PNEUMOCOCCAL 13-VAL CONJ VACC IM SUSP: 0.5000 mL | INTRAMUSCULAR | 0 refills | Status: AC

## 2018-07-30 NOTE — Patient Instructions (Addendum)
Peggy [name] , Thank you for taking time to come for your Medicare Wellness Visit. I appreciate your ongoing commitment to your health goals. Please review the following plan we discussed and let me know if I can assist you in the future.   Screening recommendations/referrals: Colonoscopy up to date, due 12/07/2027 Mammogram up to date, due 03/13/2020 Bone Density due, referral sent Recommended yearly ophthalmology/optometry visit for glaucoma screening and checkup Recommended yearly dental visit for hygiene and checkup  Vaccinations: Influenza vaccine up to date Pneumococcal vaccine 13 due, ordered to pharmacy Tdap vaccine up to date, due 11/20/2027 Shingles vaccine up to date, completed    Advanced directives: Need a copy for your chart  Conditions/risks identified: none  Next appointment: Peggy [name] 09/11/2018 @ 9am   Preventive Care 33 Years and Older, Female Preventive care refers to lifestyle choices and visits with your health care provider that can promote health and wellness. What does preventive care include?  A yearly physical exam. This is also called an annual well check.  Dental exams once or twice a year.  Routine eye exams. Ask your health care provider how often you should have your eyes checked.  Personal lifestyle choices, including:  Daily care of your teeth and gums.  Regular physical activity.  Eating a healthy diet.  Avoiding tobacco and drug use.  Limiting alcohol use.  Practicing safe sex.  Taking low-dose aspirin every day.  Taking vitamin and mineral supplements as recommended by your health care provider. What happens during an annual well check? The services and screenings done by your health care provider during your annual well check will depend on your age, overall health, lifestyle risk factors, and family history of disease. Counseling  Your health care provider may ask you questions about your:  Alcohol use.  Tobacco use.  Drug  use.  Emotional well-being.  Home and relationship well-being.  Sexual activity.  Eating habits.  History of falls.  Memory and ability to understand (cognition).  Work and work Statistician.  Reproductive health. Screening  You may have the following tests or measurements:  Height, weight, and BMI.  Blood pressure.  Lipid and cholesterol levels. These may be checked every 5 years, or more frequently if you are over 1 years old.  Skin check.  Lung cancer screening. You may have this screening every year starting at age 3 if you have a 30-pack-year history of smoking and currently smoke or have quit within the past 15 years.  Fecal occult blood test (FOBT) of the stool. You may have this test every year starting at age 1.  Flexible sigmoidoscopy or colonoscopy. You may have a sigmoidoscopy every 5 years or a colonoscopy every 10 years starting at age 14.  Hepatitis C blood test.  Hepatitis B blood test.  Sexually transmitted disease (STD) testing.  Diabetes screening. This is done by checking your blood sugar (glucose) after you have not eaten for a while (fasting). You may have this done every 1-3 years.  Bone density scan. This is done to screen for osteoporosis. You may have this done starting at age 39.  Mammogram. This may be done every 1-2 years. Talk to your health care provider about how often you should have regular mammograms. Talk with your health care provider about your test results, treatment options, and if necessary, the need for more tests. Vaccines  Your health care provider may recommend certain vaccines, such as:  Influenza vaccine. This is recommended every year.  Tetanus, diphtheria, and  acellular pertussis (Tdap, Td) vaccine. You may need a Td booster every 10 years.  Zoster vaccine. You may need this after age 58.  Pneumococcal 13-valent conjugate (PCV13) vaccine. One dose is recommended after age 59.  Pneumococcal polysaccharide  (PPSV23) vaccine. One dose is recommended after age 13. Talk to your health care provider about which screenings and vaccines you need and how often you need them. This information is not intended to replace advice given to you by your health care provider. Make sure you discuss any questions you have with your health care provider. Document Released: 09/17/2015 Document Revised: 05/10/2016 Document Reviewed: 06/22/2015 Elsevier Interactive Patient Education  2017 [place] Prevention in the Home Falls can cause injuries. They can happen to people of all ages. There are many things you can do to make your home safe and to help prevent falls. What can I do on the outside of my home?  Regularly fix the edges of walkways and driveways and fix any cracks.  Remove anything that might make you trip as you walk through a door, such as a raised step or threshold.  Trim any bushes or trees on the path to your home.  Use bright outdoor lighting.  Clear any walking paths of anything that might make someone trip, such as rocks or tools.  Regularly check to see if handrails are loose or broken. Make sure that both sides of any steps have handrails.  Any raised decks and porches should have guardrails on the edges.  Have any leaves, snow, or ice cleared regularly.  Use sand or salt on walking paths during winter.  Clean up any spills in your garage right away. This includes oil or grease spills. What can I do in the bathroom?  Use night lights.  Install grab bars by the toilet and in the tub and shower. Do not use towel bars as grab bars.  Use non-skid mats or decals in the tub or shower.  If you need to sit down in the shower, use a plastic, non-slip stool.  Keep the floor dry. Clean up any water that spills on the floor as soon as it happens.  Remove soap buildup in the tub or shower regularly.  Attach bath mats securely with double-sided non-slip rug tape.  Do not have  throw rugs and other things on the floor that can make you trip. What can I do in the bedroom?  Use night lights.  Make sure that you have a light by your bed that is easy to reach.  Do not use any sheets or blankets that are too big for your bed. They should not hang down onto the floor.  Have a firm chair that has side arms. You can use this for support while you get dressed.  Do not have throw rugs and other things on the floor that can make you trip. What can I do in the kitchen?  Clean up any spills right away.  Avoid walking on wet floors.  Keep items that you use a lot in easy-to-reach places.  If you need to reach something above you, use a strong step stool that has a grab bar.  Keep electrical cords out of the way.  Do not use floor polish or wax that makes floors slippery. If you must use wax, use non-skid floor wax.  Do not have throw rugs and other things on the floor that can make you trip. What can I do with my stairs?  Do not leave any items on the stairs.  Make sure that there are handrails on both sides of the stairs and use them. Fix handrails that are broken or loose. Make sure that handrails are as long as the stairways.  Check any carpeting to make sure that it is firmly attached to the stairs. Fix any carpet that is loose or worn.  Avoid having throw rugs at the top or bottom of the stairs. If you do have throw rugs, attach them to the floor with carpet tape.  Make sure that you have a light switch at the top of the stairs and the bottom of the stairs. If you do not have them, ask someone to add them for you. What else can I do to help prevent falls?  Wear shoes that:  Do not have high heels.  Have rubber bottoms.  Are comfortable and fit you well.  Are closed at the toe. Do not wear sandals.  If you use a stepladder:  Make sure that it is fully opened. Do not climb a closed stepladder.  Make sure that both sides of the stepladder are  locked into place.  Ask someone to hold it for you, if possible.  Clearly mark and make sure that you can see:  Any grab bars or handrails.  First and last steps.  Where the edge of each step is.  Use tools that help you move around (mobility aids) if they are needed. These include:  Canes.  Walkers.  Scooters.  Crutches.  Turn on the lights when you go into a dark area. Replace any light bulbs as soon as they burn out.  Set up your furniture so you have a clear path. Avoid moving your furniture around.  If any of your floors are uneven, fix them.  If there are any pets around you, be aware of where they are.  Review your medicines with your doctor. Some medicines can make you feel dizzy. This can increase your chance of falling. Ask your doctor what other things that you can do to help prevent falls. This information is not intended to replace advice given to you by your health care provider. Make sure you discuss any questions you have with your health care provider. Document Released: 06/17/2009 Document Revised: 01/27/2016 Document Reviewed: 09/25/2014 Elsevier Interactive Patient Education  2017 [employer].

## 2018-07-30 NOTE — Telephone Encounter (Signed)
paperwork

## 2018-07-30 NOTE — Progress Notes (Signed)
Subjective:   Peggy [name] is a 72 y.o. female who presents for Medicare Annual (Subsequent) preventive examination.    Objective:     Vitals: BP 132/76 (BP Location: Right Arm, Patient Position: Sitting)   Pulse 83   Temp 97.8 F (36.6 C) (Oral)   Ht 5\' 5"  (1.651 m)   Wt 213 lb (96.6 kg)   SpO2 98%   BMI 35.45 kg/m   Body mass index is 35.45 kg/m.  Advanced Directives 07/30/2018 12/14/2017 09/04/2017 12/25/2015 02/26/2015 02/25/2015 02/18/2015  Does Patient Have a Medical Advance Directive? No No No No No No No  Would patient like information on creating a medical advance directive? Yes (MAU/Ambulatory/Procedural Areas - Information given) No - Patient declined - - No - patient declined information - No - patient declined information    Tobacco Social History   Tobacco Use  Smoking Status Never Smoker  Smokeless Tobacco Never Used     Counseling given: Not Answered   Clinical Intake:  Pre-visit preparation completed: No  Pain : 0-10 Pain Score: 8  Pain Type: Chronic pain Pain Location: Knee Pain Orientation: Right, Left Pain Descriptors / Indicators: Aching Pain Onset: More than a month ago Pain Frequency: Intermittent     Diabetes: No  How often do you need to have someone help you when you read instructions, pamphlets, or other written materials from your doctor or pharmacy?: 1 - Never  Interpreter Needed?: No  Information entered by :: Tyson Dense, Rn  Past Medical History:  Diagnosis Date  . Anxiety   . Arthritis    "all over"  . Asthma   . Bladder cancer ([place])   . Chronic bronchitis ([place])    'get it q yr"  . Chronic lower back pain   . Daily headache   . Deafness in left ear   . Depression   . DVT (deep venous thrombosis) ([place]) 2009/2010   "probably left leg"   . GERD (gastroesophageal reflux disease)   . History of cardiac catheterization 2005, 2012   Both catheterizations for "anginal symptoms "showed angiographically normal coronary  arteries. -->  Negative Myoview June 2016.  Coronary calcification noted on chest CT  . History of kidney stones   . Hypercholesteremia   . Hypertension   . Nummular eczema   . Skin cancer    "right abdomen"   Past Surgical History:  Procedure Laterality Date  . ABDOMINAL HYSTERECTOMY  1980's  . APPENDECTOMY Right   . BACK SURGERY     x 2, lower  . BLADDER TUMOR EXCISION    . BREAST BIOPSY Right   . BREAST EXCISIONAL BIOPSY Right   . CAROTID ARTERY DOPPLERS   10/2017   Carotid Dopplers: 40-59% stenosis on the Right Internal Carotid, < 40% stenosis in the Left. Both vertebral arteries show normal flow. Left subclavian artery is normal, but there is some flow disturbance in the right subclavian artery.  . COLONOSCOPY WITH PROPOFOL N/A 02/26/2015   Procedure: COLONOSCOPY WITH PROPOFOL;  Surgeon: Carol Ada, MD;  Location: [facility];  Service: Endoscopy;  Laterality: N/A;  . COLONOSCOPY WITH PROPOFOL N/A 12/06/2017   Procedure: COLONOSCOPY WITH PROPOFOL;  Surgeon: Juanita Craver, MD;  Location: [facility];  Service: Endoscopy;  Laterality: N/A;  . CORONARY CALCIUM SCORE & CT ANGIOGRAM  11/2017   Coronary calcium score 131 (moderate risk).  Moderate proximal LAD & LCx stenosis --> LOW RISK STUDY.  No evidence to suspect cardiac related chest pain --> correlates with  cardiac catheterizations  . ENTEROSCOPY N/A 12/14/2017   Procedure: ENTEROSCOPY;  Surgeon: Carol Ada, MD;  Location: [facility];  Service: Endoscopy;  Laterality: N/A;  . ESOPHAGOGASTRODUODENOSCOPY Left 02/20/2015   Procedure: ESOPHAGOGASTRODUODENOSCOPY (EGD);  Surgeon: Milus Banister, MD;  Location: [facility];  Service: Endoscopy;  Laterality: Left;  . ESOPHAGOGASTRODUODENOSCOPY (EGD) WITH PROPOFOL N/A 12/06/2017   Procedure: ESOPHAGOGASTRODUODENOSCOPY (EGD) WITH PROPOFOL;  Surgeon: Juanita Craver, MD;  Location: [facility];  Service: Endoscopy;  Laterality: N/A;  . HOT HEMOSTASIS N/A 12/14/2017   Procedure: HOT  HEMOSTASIS (ARGON PLASMA COAGULATION/BICAP);  Surgeon: Carol Ada, MD;  Location: [facility];  Service: Endoscopy;  Laterality: N/A;  . JOINT REPLACEMENT    . KNEE ARTHROSCOPY Right ~ 2000  . LEFT HEART CATH AND CORONARY ANGIOGRAPHY  11/'05; 2/'12   Angiographically normal coronary arteries  . LUMBAR DISC SURGERY  1980's X 1; ~ 2002  . MEDIAL PARTIAL KNEE REPLACEMENT Right 2001  . NM MYOVIEW LTD  02/2015   EF 65%.  No reversible ischemia.  Possible scar along the cardiac apex and anteroseptal wall.  (Mild severity, medium-sized defect in the apex anteroseptal wall noted on both resting and stress images).  No inducible ischemia.  Read as LOW RISK  . PARTIAL KNEE ARTHROPLASTY Left 2009  . REPLACEMENT TOTAL KNEE BILATERAL Bilateral   . REVISION TOTAL KNEE ARTHROPLASTY Left   . SKIN CANCER EXCISION Right    "abdomen"  . TRANSTHORACIC ECHOCARDIOGRAM  06/'16; 2/'19    A) mod Conc LVH. EF 60-65%.  No RWMA. GR 1 D. Mild Ao Stenosis. Mild LA dilation.;  B) EF 60-65%.  Mild LVH.  No or W MA.  No significant valve disease.  Aortic stenosis not noted   Family History  Problem Relation Age of Onset  . Heart failure Mother   . Cancer Mother   . Hypertension Mother   . Hypertension Father   . Heart attack Father        Noted on cardiology intake form as "massive heart attack" at age 80  . Heart failure Sister   . Heart attack Sister        Not listed on her cardiology intake form  . Breast cancer Sister   . Heart failure Sister   . Heart attack Sister        Not listed on her cardiology intake  . Cancer Sister   . Breast cancer Sister    Social History   Socioeconomic History  . Marital status: Single    Spouse name: Not on file  . Number of children: 1  . Years of education: Not on file  . Highest education level: Not on file  Occupational History  . Occupation: Retired    Fish farm manager: RETIRED    CommentProgrammer, systems  . Financial resource strain: Not hard at all   . Food insecurity:    Worry: Never true    Inability: Never true  . Transportation needs:    Medical: No    Non-medical: No  Tobacco Use  . Smoking status: Never Smoker  . Smokeless tobacco: Never Used  Substance and Sexual Activity  . Alcohol use: No  . Drug use: No  . Sexual activity: Never  Lifestyle  . Physical activity:    Days per week: 0 days    Minutes per session: 0 min  . Stress: Only a little  Relationships  . Social connections:    Talks on phone: Never  Gets together: Never    Attends religious service: More than 4 times per year    Active member of club or organization: No    Attends meetings of clubs or organizations: Never    Relationship status: Never married  Other Topics Concern  . Not on file  Social History Narrative   Relocated from [place]   She is currently "'.  She lives with her son (who is autistic).  She herself is disabled, and not working.   She never smoked and never drank alcohol.   She enjoys walking 7 days a week.    Outpatient Encounter Medications as of 07/30/2018  Medication Sig  . acetaminophen (TYLENOL) 500 MG tablet Take 1,000 mg by mouth every 8 (eight) hours as needed for moderate pain or headache.   . diazepam (VALIUM) 5 MG tablet Take 5 mg by mouth every 6 (six) hours as needed for anxiety or muscle spasms.  . diphenhydrAMINE (ALLERGY RELIEF) 25 mg capsule Take 25 mg by mouth daily.  . divalproex (DEPAKOTE ER) 500 MG 24 hr tablet Take 1,000 mg by mouth at bedtime as needed (depression).   Marland Kitchen FLUoxetine (PROZAC) 40 MG capsule Take 40 mg by mouth daily as needed (depression).  . furosemide (LASIX) 20 MG tablet MAKE TAKE 20 MG  AS NEEDED FOR  SWELLING OR SHORTNESS OF BREATH  . NON FORMULARY [facility]  Achilles Tendonitis Cream- Diclofenac 3%, Baclofen 2%, Bupivacaine 1%, Doxepin 5%, Gabapentin 6%, Ibuprofen 3%, Pentoxifylline 3% Apply 1-2 grams to affected area 3-4 times daily Qty. 120 gm 3 refills  . pneumococcal  13-valent conjugate vaccine (PREVNAR 13) SUSP injection Inject 0.5 mLs into the muscle tomorrow at 10 am for 1 dose.  . simvastatin (ZOCOR) 80 MG tablet Take 80 mg by mouth at bedtime.  . traMADol (ULTRAM) 50 MG tablet Take 1 tablet (50 mg total) by mouth every 6 (six) hours as needed.  . trandolapril (MAVIK) 2 MG tablet Take 2 mg by mouth daily.  . valACYclovir (VALTREX) 1000 MG tablet Take 500 mg by mouth 3 (three) times daily as needed (for outbreak).   . [DISCONTINUED] pneumococcal 13-valent conjugate vaccine (PREVNAR 13) SUSP injection Inject 0.5 mLs into the muscle tomorrow at 10 am.   No facility-administered encounter medications on file as of 07/30/2018.     Activities of Daily Living In your present state of health, do you have any difficulty performing the following activities: 07/30/2018  Hearing? N  Vision? N  Difficulty concentrating or making decisions? N  Walking or climbing stairs? N  Dressing or bathing? N  Doing errands, shopping? N  Preparing Food and eating ? N  Using the Toilet? N  In the past six months, have you accidently leaked urine? Y  Comment stress incontinence  Do you have problems with loss of bowel control? N  Managing your Medications? N  Managing your Finances? N  Housekeeping or managing your Housekeeping? N  Some recent data might be hidden    Patient Care Team: Glendale Chard, MD as PCP - General (Internal Medicine)    Assessment:   This is a routine wellness examination for [employer].  Exercise Activities and Dietary recommendations Current Exercise Habits: The patient does not participate in regular exercise at present, Exercise limited by: None identified  Goals   None     Fall Risk Fall Risk  07/30/2018  Falls in the past year? 0  Number falls in past yr: 0  Injury with Fall? 0  Is the patient's home free of loose throw rugs in walkways, pet beds, electrical cords, etc?   yes      Grab bars in the bathroom? no      Handrails on  the stairs?   yes      Adequate lighting?   yes  Depression Screen PHQ 2/9 Scores 07/30/2018  PHQ - 2 Score 0     Cognitive Function     6CIT Screen 07/30/2018  What Year? 0 points  What month? 0 points  What time? 0 points  Count back from 20 0 points  Months in reverse 0 points  Repeat phrase 0 points  Total Score 0    Immunization History  Administered Date(s) Administered  . Tdap 11/19/2017  . Zoster Recombinat (Shingrix) 09/04/2016, 02/02/2017    Qualifies for Shingles Vaccine?up to date, completed  Screening Tests Health Maintenance  Topic Date Due  . Hepatitis C Screening  October 25, 1945  . DEXA SCAN  09/29/2008  . PNA vac Low Risk Adult (1 of 2 - PCV13) 09/29/2008  . INFLUENZA VACCINE  04/04/2018  . MAMMOGRAM  03/13/2020  . TETANUS/TDAP  11/20/2027  . COLONOSCOPY  12/07/2027    Cancer Screenings: Lung: Low Dose CT Chest recommended if Age 88-80 years, 30 pack-year currently smoking OR have quit w/in 15years. Patient does not qualify. Breast:  Up to date on Mammogram? Yes   Up to date of Bone Density/Dexa? No, referral sent Colorectal: up to date  Additional Screenings: Hepatitis C Screening: declined Prevnar due: ordered    Plan:  I have personally reviewed and addressed the Medicare Annual Wellness questionnaire and have noted the following in the patient's chart:  A. Medical and social history B. Use of alcohol, tobacco or illicit drugs  C. Current medications and supplements D. Functional ability and status E.  Nutritional status F.  Physical activity G. Advance directives H. List of other physicians I.  Hospitalizations, surgeries, and ER visits in previous 12 months J.  [place] to include hearing, vision, cognitive, depression L. Referrals and appointments - none  In addition, I have reviewed and discussed with patient certain preventive protocols, quality metrics, and best practice recommendations. A written personalized care  plan for preventive services as well as general preventive health recommendations were provided to patient.  See attached scanned questionnaire for additional information.   Signed,   Tyson Dense, RN Nurse Health Advisor  Patient concerns: None

## 2018-08-07 ENCOUNTER — Telehealth: Payer: Self-pay | Admitting: Internal Medicine

## 2018-08-07 DIAGNOSIS — H25813 Combined forms of age-related cataract, bilateral: Secondary | ICD-10-CM | POA: Diagnosis not present

## 2018-08-07 DIAGNOSIS — H10413 Chronic giant papillary conjunctivitis, bilateral: Secondary | ICD-10-CM | POA: Diagnosis not present

## 2018-08-07 DIAGNOSIS — H40013 Open angle with borderline findings, low risk, bilateral: Secondary | ICD-10-CM | POA: Diagnosis not present

## 2018-08-07 NOTE — Telephone Encounter (Signed)
Called Pt and LVM regarding Community Resource Referral for dental help.  c/b # [place]

## 2018-08-19 MED FILL — VIT D2 1.25 MG (50,000 UNIT: 1.25 MG | 28 days supply | Qty: 8 | Fill #3

## 2018-08-29 MED FILL — TRANDOLAPRIL 2 MG TAB: 2 | 90 days supply | Qty: 90 | Fill #0

## 2018-08-29 MED FILL — DIVALPROEX SOD ER 500 MG TA: 500 | 30 days supply | Qty: 60 | Fill #2

## 2018-08-29 MED FILL — FLUoxetine HCL 40 MG CAPS: 40 | 30 days supply | Qty: 30 | Fill #0

## 2018-09-03 ENCOUNTER — Telehealth: Payer: Self-pay

## 2018-09-09 NOTE — Telephone Encounter (Signed)
09/03/18 left detailed message (per note in pt chart) about community dental resource with my name and number to call if any questions on VM. MA

## 2018-09-11 ENCOUNTER — Ambulatory Visit (INDEPENDENT_AMBULATORY_CARE_PROVIDER_SITE_OTHER): Payer: Medicare HMO | Admitting: Nurse Practitioner

## 2018-09-11 ENCOUNTER — Encounter: Payer: Self-pay | Admitting: Nurse Practitioner

## 2018-09-11 ENCOUNTER — Ambulatory Visit: Payer: Medicare HMO

## 2018-09-11 VITALS — BP 158/78 | HR 105 | Temp 98.0°F | Ht 62.5 in | Wt 216.0 lb

## 2018-09-11 DIAGNOSIS — I10 Essential (primary) hypertension: Secondary | ICD-10-CM | POA: Diagnosis not present

## 2018-09-11 DIAGNOSIS — G8929 Other chronic pain: Secondary | ICD-10-CM | POA: Diagnosis not present

## 2018-09-11 DIAGNOSIS — M25562 Pain in left knee: Secondary | ICD-10-CM | POA: Diagnosis not present

## 2018-09-11 LAB — BMP8+EGFR
BUN/Creatinine Ratio: 9 — ABNORMAL LOW (ref 12–28)
BUN: 8 mg/dL (ref 8–27)
CALCIUM: 9.2 mg/dL (ref 8.7–10.3)
CO2: 25 mmol/L (ref 20–29)
Chloride: 104 mmol/L (ref 96–106)
Creatinine, Ser: 0.94 mg/dL (ref 0.57–1.00)
GFR calc non Af Amer: 60 mL/min/{1.73_m2} (ref >59)
GFR, EST AFRICAN AMERICAN: 69 mL/min/{1.73_m2} (ref >59)
Glucose: 83 mg/dL (ref 65–99)
POTASSIUM: 4.6 mmol/L (ref 3.5–5.2)
Sodium: 145 mmol/L — ABNORMAL HIGH (ref 134–144)

## 2018-09-11 MED ORDER — TRANDOLAPRIL 2 MG PO TABS: 2.0000 mg | ORAL_TABLET | Freq: Every day | ORAL | 1 refills | Status: DC

## 2018-09-11 MED ORDER — VALACYCLOVIR HCL 1 G PO TABS: 500.0000 mg | ORAL_TABLET | Freq: Three times a day (TID) | ORAL | 3 refills | Status: DC | PRN

## 2018-09-11 MED ORDER — OXYCODONE-ACETAMINOPHEN 7.5-325 MG PO TABS: 1.0000 | ORAL_TABLET | ORAL | 0 refills | Status: DC | PRN

## 2018-09-11 MED ORDER — SIMVASTATIN 80 MG PO TABS
80.0000 mg | ORAL_TABLET | Freq: Every day | ORAL | 1 refills | Status: DC
Start: 2018-09-11 — End: 2018-12-11

## 2018-09-11 MED FILL — valACYclovir HCL 1 GM TABS: 1 | 30 days supply | Qty: 45 | Fill #0

## 2018-09-11 MED FILL — VIT D2 1.25 MG (50,000 UNIT: 1.25 MG | 28 days supply | Qty: 8 | Fill #4

## 2018-09-11 MED FILL — OXYCODON-ACETAMINOPHEN 7.5-: 7.5-325 | 4 days supply | Qty: 20 | Fill #0

## 2018-09-11 NOTE — Progress Notes (Signed)
Subjective:     Patient ID: Peggy [name] , female    DOB: [birth year] , 73 y.o.   MRN: [id number]   Chief Complaint  Patient presents with  . Hypertension    HPI  Hypertension  This is a chronic problem. The current episode started more than 1 year ago. The problem is unchanged. The problem is uncontrolled. Pertinent negatives include no anxiety, chest pain, malaise/fatigue, palpitations or shortness of breath. There are no associated agents to hypertension. Risk factors for coronary artery disease include sedentary lifestyle. Past treatments include ACE inhibitors.     Past Medical History:  Diagnosis Date  . Anxiety   . Arthritis    "all over"  . Asthma   . Bladder cancer ([place])   . Chronic bronchitis ([place])    'get it q yr"  . Chronic lower back pain   . Daily headache   . Deafness in left ear   . Depression   . DVT (deep venous thrombosis) ([place]) 2009/2010   "probably left leg"   . GERD (gastroesophageal reflux disease)   . History of cardiac catheterization 2005, 2012   Both catheterizations for "anginal symptoms "showed angiographically normal coronary arteries. -->  Negative Myoview June 2016.  Coronary calcification noted on chest CT  . History of kidney stones   . Hypercholesteremia   . Hypertension   . Nummular eczema   . Skin cancer    "right abdomen"     Family History  Problem Relation Age of Onset  . Heart failure Mother   . Cancer Mother   . Hypertension Mother   . Hypertension Father   . Heart attack Father        Noted on cardiology intake form as "massive heart attack" at age 43  . Heart failure Sister   . Heart attack Sister        Not listed on her cardiology intake form  . Breast cancer Sister   . Heart failure Sister   . Heart attack Sister        Not listed on her cardiology intake  . Cancer Sister   . Breast cancer Sister      Current Outpatient Medications:  .  acetaminophen (TYLENOL) 500 MG tablet, Take 1,000 mg by mouth every 8  (eight) hours as needed for moderate pain or headache. , Disp: , Rfl:  .  diphenhydrAMINE (ALLERGY RELIEF) 25 mg capsule, Take 25 mg by mouth daily., Disp: , Rfl:  .  divalproex (DEPAKOTE ER) 500 MG 24 hr tablet, Take 1,000 mg by mouth at bedtime as needed (depression). , Disp: , Rfl:  .  FLUoxetine (PROZAC) 40 MG capsule, Take 40 mg by mouth daily as needed (depression)., Disp: , Rfl:  .  simvastatin (ZOCOR) 80 MG tablet, Take 80 mg by mouth at bedtime., Disp: , Rfl:  .  traMADol (ULTRAM) 50 MG tablet, Take 1 tablet (50 mg total) by mouth every 6 (six) hours as needed., Disp: 20 tablet, Rfl: 0 .  trandolapril (MAVIK) 2 MG tablet, Take 2 mg by mouth daily., Disp: , Rfl: 2 .  valACYclovir (VALTREX) 1000 MG tablet, Take 500 mg by mouth 3 (three) times daily as needed (for outbreak). , Disp: , Rfl: 0 .  NON FORMULARY, [facility]  Achilles Tendonitis Cream- Diclofenac 3%, Baclofen 2%, Bupivacaine 1%, Doxepin 5%, Gabapentin 6%, Ibuprofen 3%, Pentoxifylline 3% Apply 1-2 grams to affected area 3-4 times daily Qty. 120 gm 3 refills, Disp: , Rfl:  Allergies  Allergen Reactions  . Penicillins Shortness Of Breath, Itching, Swelling, Rash and Other (See Comments)    Has patient had a PCN reaction causing immediate rash, facial/tongue/throat swelling, SOB or lightheadedness with hypotension: Yes Has patient had a PCN reaction causing severe rash involving mucus membranes or skin necrosis: No Has patient had a PCN reaction that required hospitalization: Yes Has patient had a PCN reaction occurring within the last 10 years: No If all of the above answers are "NO", then may proceed with Cephalosporin use.   . Azithromycin Itching, Swelling and Rash  . Levaquin [Levofloxacin In D5w] Hives, Itching, Swelling and Rash  . Clindamycin/Lincomycin Hives  . Erythromycin Base Itching  . Keflex [Cephalexin] Itching and Rash  . Sulfa Antibiotics Itching and Rash  . Tetracyclines & Related Itching and Rash      Review of Systems  Constitutional: Negative.  Negative for malaise/fatigue.  Respiratory: Negative for shortness of breath.   Cardiovascular: Negative for chest pain and palpitations.  Endocrine: Negative for polydipsia, polyphagia and polyuria.  Genitourinary: Negative.   Musculoskeletal: Positive for arthralgias and joint swelling.  Psychiatric/Behavioral: Negative.      Today's Vitals   09/11/18 0934  BP: (!) 158/78  Pulse: (!) 105  Temp: 98 F (36.7 C)  TempSrc: Oral  SpO2: 95%  Weight: 216 lb (98 kg)  Height: 5' 2.5" (1.588 m)  PainSc: 10-Worst pain ever  PainLoc: Knee   Body mass index is 38.88 kg/m.   Objective:  Physical Exam Constitutional:      Appearance: Normal appearance.  Cardiovascular:     Rate and Rhythm: Normal rate and regular rhythm.     Pulses: Normal pulses.     Heart sounds: Normal heart sounds. No murmur.  Musculoskeletal:        General: Swelling (left knee ) present. Deformity: swelling to left knee tender to touch.  Skin:    General: Skin is warm and dry.  Neurological:     General: No focal deficit present.     Mental Status: She is alert and oriented to person, place, and time.  Psychiatric:        Mood and Affect: Mood normal.         Assessment And Plan:     1. Chronic pain of left knee  Left knee is extremely swollen, tender to touch  I will refer her back to Dr. Early Chars - Ambulatory referral to Orthopedic Surgery  2. Essential hypertension  Chronic  Slightly elevated today likely related to pain  No changes to her medications at this time - [facility]       [name], FNP

## 2018-09-12 DIAGNOSIS — F419 Anxiety disorder, unspecified: Secondary | ICD-10-CM | POA: Diagnosis not present

## 2018-09-12 MED FILL — diazePAM 5 MG TABS: 5 | 8 days supply | Qty: 30 | Fill #0

## 2018-09-20 ENCOUNTER — Encounter: Payer: Self-pay | Admitting: Sports Medicine

## 2018-09-20 ENCOUNTER — Ambulatory Visit (INDEPENDENT_AMBULATORY_CARE_PROVIDER_SITE_OTHER): Payer: Medicare HMO | Admitting: Sports Medicine

## 2018-09-20 VITALS — BP 155/71 | Ht 62.5 in | Wt 216.0 lb

## 2018-09-20 DIAGNOSIS — M2141 Flat foot [pes planus] (acquired), right foot: Secondary | ICD-10-CM | POA: Diagnosis not present

## 2018-09-20 DIAGNOSIS — M2142 Flat foot [pes planus] (acquired), left foot: Secondary | ICD-10-CM

## 2018-09-20 DIAGNOSIS — M722 Plantar fascial fibromatosis: Secondary | ICD-10-CM

## 2018-09-20 NOTE — Patient Instructions (Signed)
Your foot pain is due to having flat feet and some plantar fasciitis We will see how the inserts with arch support helped her feet over the next few weeks.  You can use these inserts and any pair of shoes.  If you put them in a different pair of shoes, remove the sole that came with the shoe. We will have you follow-up in 3 weeks.  If these have been helpful, we can make you a custom pair of orthotics that will be more durable and last longer.

## 2018-09-20 NOTE — Progress Notes (Signed)
  Peggy [name] - 73 y.o. female MRN [id number]  Date of birth: Dec 15, 1945    SUBJECTIVE:      Chief Complaint:/ HPI:  73 year old female presents with bilateral foot pain.  Left is worse than right.  Her pain started a few weeks ago and is located over the plantar surface of the foot from the heel to forefoot.  Patient has a history of plantar fasciitis but states that this is more painful.  She denies any swelling or erythema.  No numbness or tingling.  She takes ibuprofen which does not help with the pain.  She initially tried wearing arch straps which caused more pain.  When she previously saw podiatry for her plantar fasciitis, they performed a steroid injection but this was very limited in its duration of benefit.  She has not tried orthotics previously   ROS:     See HPI  PERTINENT  PMH / PSH FH / / SH:  Past Medical, Surgical, Social, and Family History Reviewed & Updated in the EMR.    OBJECTIVE: BP (!) 155/71   Ht 5' 2.5" (1.588 m)   Wt 216 lb (98 kg)   BMI 38.88 kg/m   Physical Exam:  Vital signs are reviewed.  GEN: Alert and oriented, NAD Pulm: Breathing unlabored PSY: normal mood, congruent affect  MSK: Left foot: Inspection: No swelling, erythema, or bruising.  While seated patient has significant longitudinal arch collapse.  This worsens with standing and there is medial prominence of the navicular.  She also has transverse arch collapse.  Calcaneal valgus. Palpation: There is tenderness along the plantar fascia.  Mild tenderness over the Achilles tendon ROM: Full  ROM of the ankle. Normal midfoot flexibility Strength: 4+5 strength ankle in all planes Neurovascular: N/V intact   Right foot: Inspection: No swelling, erythema, or bruising.  While seated patient has significant longitudinal arch collapse.  This worsens with standing and there is medial prominence of the navicular.  She also has transverse arch collapse.  Calcaneal valgus. Palpation: There is tenderness  along the plantar fascia.  No tenderness over the Achilles tendon ROM: Full  ROM of the ankle. Normal midfoot flexibility Strength: 4+/5 strength ankle in all planes Neurovascular: N/V intact    ASSESSMENT & PLAN:  1.  Bilateral foot pain- patient has severe pes planus and of result symptoms consistent with plantar fasciitis. -Today we fit her with green inserts (size 7.5-8.5W) and scaphoid pad (medium).  Patient noted some medial improvement in her pain after walking with these. -We will see how she does with these over the next 3 weeks.  If this provides relief, she will return and we will treat her custom orthotics -In the meantime she will continue NSAIDs as needed.  Patient seen and evaluated with the sports medicine fellow.  I agree with the above plan of care.  Patient will try a pair of green sports insoles and scaphoid pads and follow-up in 3 to 4 weeks.  If she finds these to be comfortable then we will consider making custom orthotics at that time.

## 2018-09-29 ENCOUNTER — Encounter: Payer: Self-pay | Admitting: Nurse Practitioner

## 2018-10-10 ENCOUNTER — Ambulatory Visit
Admission: RE | Admit: 2018-10-10 | Discharge: 2018-10-10 | Disposition: A | Discharge status: Home / Self Care | Payer: Medicare HMO | Source: Ambulatory Visit | Attending: Internal Medicine | Admitting: Internal Medicine

## 2018-10-10 DIAGNOSIS — Z780 Asymptomatic menopausal state: Secondary | ICD-10-CM | POA: Diagnosis not present

## 2018-10-10 DIAGNOSIS — Z13820 Encounter for screening for osteoporosis: Secondary | ICD-10-CM | POA: Diagnosis not present

## 2018-10-10 DIAGNOSIS — E2839 Other primary ovarian failure: Secondary | ICD-10-CM

## 2018-10-17 ENCOUNTER — Ambulatory Visit: Payer: Medicare HMO | Admitting: Sports Medicine

## 2018-10-18 ENCOUNTER — Ambulatory Visit (HOSPITAL_COMMUNITY)
Admission: RE | Admit: 2018-10-18 | Discharge: 2018-10-18 | Disposition: A | Discharge status: Home / Self Care | Payer: Medicare HMO | Source: Ambulatory Visit | Attending: Cardiology | Admitting: Cardiology

## 2018-10-18 DIAGNOSIS — R0989 Other specified symptoms and signs involving the circulatory and respiratory systems: Secondary | ICD-10-CM | POA: Insufficient documentation

## 2018-10-21 ENCOUNTER — Telehealth: Payer: Self-pay | Admitting: *Deleted

## 2018-10-21 NOTE — Telephone Encounter (Signed)
Left message to call back in regards to carotid doppler report

## 2018-10-21 NOTE — Telephone Encounter (Signed)
-----   Message from Leonie Man, MD sent at 10/20/2018 11:06 PM EST ----- Carotid Doppler results show mild 1-39% stenosis in the right internal carotid and no left carotid stenosis.  Bilateral vertebral and subclavian arteries are normal.  At this point probably would not recheck for another couple years.  Glenetta Hew, MD

## 2018-10-28 MED FILL — FLUoxetine HCL 40 MG CAPS: 40 | 30 days supply | Qty: 30 | Fill #1 | Status: TO

## 2018-10-28 MED FILL — diazePAM 5 MG TABS: 5 | 8 days supply | Qty: 30 | Fill #1

## 2018-10-31 ENCOUNTER — Encounter: Payer: Self-pay | Admitting: Sports Medicine

## 2018-10-31 ENCOUNTER — Ambulatory Visit (INDEPENDENT_AMBULATORY_CARE_PROVIDER_SITE_OTHER): Payer: Medicare HMO | Admitting: Sports Medicine

## 2018-10-31 VITALS — BP 141/72 | Ht 62.5 in | Wt 216.0 lb

## 2018-10-31 DIAGNOSIS — M722 Plantar fascial fibromatosis: Secondary | ICD-10-CM | POA: Diagnosis not present

## 2018-10-31 DIAGNOSIS — M2141 Flat foot [pes planus] (acquired), right foot: Secondary | ICD-10-CM

## 2018-10-31 DIAGNOSIS — M2142 Flat foot [pes planus] (acquired), left foot: Secondary | ICD-10-CM

## 2018-10-31 MED ORDER — METHYLPREDNISOLONE ACETATE 40 MG/ML IJ SUSP
40.0000 mg | Freq: Once | INTRAMUSCULAR | Status: AC
  Administered 2018-10-31: 40 mg via INTRA_ARTICULAR

## 2018-10-31 NOTE — Progress Notes (Signed)
Peggy [name] - 73 y.o. 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 She was previously treated with green inserts and a medium scaphoid pad which she states made her "feet hurt worse". She did not attempt use of these for more than a few days and they are not in her possession today. She utilized NSAIDs minimally and intermittently. She was initially planned for custom orthotics today but due the high cost she forwent the process. The pain continues, worse with walking and standing, worse in the right foot at 9/10 but 6/10 in the left today as well.  Her pain is sharp in nature.  This prohibits her ability to perform her ADL's.    ROS:     See HPI  PERTINENT  PMH / PSH FH / / SH:  Past Medical, Surgical, Social, and Family History Reviewed & Updated in the EMR.  Pertinent findings include:   OBJECTIVE: BP (!) 141/72   Ht 5' 2.5" (1.588 m)   Wt 216 lb (98 kg)   BMI 38.88 kg/m   Physical Exam:  Vital signs are reviewed.  GEN: Alert and oriented, NAD Pulm: Breathing unlabored PSY: normal mood, congruent affect  MSK: Right foot: Inspection: Longitudinal arch collapse.  No swelling or erythema Palpation: Severe tenderness over the medial calcaneal tubercle and along the plantar fascia at the midfoot ROM: Full  ROM of the ankle. Strength: 5/5 strength ankle in all planes Neurovascular: N/V intact distally in the lower extremity   Left foot: Inspection: Longitudinal arch collapse  No swelling or erythema Palpation:  tenderness over the medial calcaneal tubercle, less so than on the right ROM: Full  ROM of the ankle.  Strength: 5/5 strength ankle in all planes Neurovascular: N/V intact distally in the lower extremity  MSK [place]: Right plantar fascia: Marked enlargement of the plantar  fascial plain near the origin at the medial calcaneus measuring approximately 29mm  Left plantar fascia: No significant large mass of the plantar fascia at its insertion on the calcaneus.  Measurement of 0.46 mm   ASSESSMENT & PLAN:  1. Bilateral plantar foot pain secondary to plantar fasciitis.  Right is significantly worse than the left. -Bilateral steroid injections performed today -Arch straps - At her convenience, she will bring the Green insoles to the clinic and we will remove the scaphoid pads. - Reviewed and reinforced home exercises/stretching -Follow-up 4 to 6 weeks   Procedure performed: Plantar fascia steroid injection, palpation guided Consent obtained and verified. Time-out conducted.  RIGHT plantar fascia: Noted no overlying erythema, induration, or other signs of local infection. The  right medial calcaneal tubercle was palpated and marked.  The overlying skin was prepped in a sterile fashion. Topical analgesic spray: Ethyl chloride. Needle: 25-gauge, 1.5 inch Completed without difficulty. Meds: Depo-Medrol 40 mg, lidocaine 2 cc   LEFT  plantar fascia: Noted no overlying erythema, induration, or other signs of local infection. The  right medial calcaneal tubercle was palpated and marked.  The overlying skin was prepped in a sterile fashion. Topical analgesic spray: Ethyl chloride. Needle: 25-gauge, 1.5 inch Completed without difficulty. Meds: Depo-Medrol 40 mg, lidocaine 2 cc     I saw and evaluated the patient with the resident. The injections described above were performed by me. I  agree with the evaluation and treatment plan as discussed and edited the above note as necessary. Coralyn Helling, DO Sports Medicine Fellow  I was the preceptor for this visit and available for immediate consultation Shellia Cleverly, DO

## 2018-10-31 NOTE — Patient Instructions (Signed)
Today performed a steroid injection for your plantar fasciitis. Do the home exercises once daily At your convenience, bring the green insoles to the office and we will adjust them for you. We will have you follow-up in 4 to 6 weeks as needed

## 2018-11-01 ENCOUNTER — Encounter: Payer: Self-pay | Admitting: Nurse Practitioner

## 2018-11-18 ENCOUNTER — Ambulatory Visit: Payer: Medicare HMO | Admitting: Nurse Practitioner

## 2018-11-18 ENCOUNTER — Other Ambulatory Visit: Payer: Self-pay

## 2018-12-06 ENCOUNTER — Telehealth: Payer: Self-pay | Admitting: *Deleted

## 2018-12-06 NOTE — Telephone Encounter (Signed)
SPOKE TO PATIENT SHE STATES SHE HAS BEEN HAVING CRAMPS - IN HER CHEST DURING THE DAY AND AT NIGHT LEG CRAMPS -    PATIENT WOULD A TEL-VISIT  BY TELEPHONE 12/12/18    TELEPHONE CALL NOTE  This patient has been deemed a candidate for follow-up tele-health visit to limit community exposure during the Covid-19 pandemic. I spoke with the patient via phone to discuss instructions.   The patient will receive a phone call 2-3 days prior to their E-Visit at which time consent will be verbally confirmed. A Virtual Office Visit appointment type has been scheduled for 12/12/18 11 AM  with DR HARDING.- patient prefers TELEPHONE  type.  I have either confirmed the patient is active in [place] or offered to send sign-up link to phone/email via Mychart.   PATIENT DECLINE.  Doy Mince, RN 12/06/2018 10:45 AM

## 2018-12-11 ENCOUNTER — Ambulatory Visit (INDEPENDENT_AMBULATORY_CARE_PROVIDER_SITE_OTHER): Payer: Medicare HMO | Admitting: Nurse Practitioner

## 2018-12-11 ENCOUNTER — Other Ambulatory Visit: Payer: Self-pay

## 2018-12-11 ENCOUNTER — Telehealth: Payer: Self-pay | Admitting: Cardiology

## 2018-12-11 ENCOUNTER — Encounter: Payer: Self-pay | Admitting: Nurse Practitioner

## 2018-12-11 VITALS — BP 136/78 | HR 85 | Temp 98.4°F | Ht 58.6 in | Wt 214.0 lb

## 2018-12-11 DIAGNOSIS — T148XXA Other injury of unspecified body region, initial encounter: Secondary | ICD-10-CM | POA: Diagnosis not present

## 2018-12-11 DIAGNOSIS — M25562 Pain in left knee: Secondary | ICD-10-CM

## 2018-12-11 DIAGNOSIS — E782 Mixed hyperlipidemia: Secondary | ICD-10-CM

## 2018-12-11 DIAGNOSIS — Z7189 Other specified counseling: Secondary | ICD-10-CM

## 2018-12-11 DIAGNOSIS — G8929 Other chronic pain: Secondary | ICD-10-CM

## 2018-12-11 DIAGNOSIS — I10 Essential (primary) hypertension: Secondary | ICD-10-CM

## 2018-12-11 LAB — CMP14 + ANION GAP
ALT: 12 IU/L (ref 0–32)
AST: 18 IU/L (ref 0–40)
Albumin/Globulin Ratio: 1.4 (ref 1.2–2.2)
Albumin: 4.2 g/dL (ref 3.7–4.7)
Alkaline Phosphatase: 121 IU/L — ABNORMAL HIGH (ref 39–117)
Anion Gap: 14 mmol/L (ref 10.0–18.0)
BUN/Creatinine Ratio: 12 (ref 12–28)
BUN: 14 mg/dL (ref 8–27)
Bilirubin Total: 0.4 mg/dL (ref 0.0–1.2)
CO2: 26 mmol/L (ref 20–29)
Calcium: 9.6 mg/dL (ref 8.7–10.3)
Chloride: 104 mmol/L (ref 96–106)
Creatinine, Ser: 1.15 mg/dL — ABNORMAL HIGH (ref 0.57–1.00)
GFR calc Af Amer: 54 mL/min/{1.73_m2} — ABNORMAL LOW (ref >59)
GFR calc non Af Amer: 47 mL/min/{1.73_m2} — ABNORMAL LOW (ref >59)
Globulin, Total: 3.1 g/dL (ref 1.5–4.5)
Glucose: 87 mg/dL (ref 65–99)
Potassium: 4.7 mmol/L (ref 3.5–5.2)
Sodium: 144 mmol/L (ref 134–144)
Total Protein: 7.3 g/dL (ref 6.0–8.5)

## 2018-12-11 LAB — CBC
Hematocrit: 33.2 % — ABNORMAL LOW (ref 34.0–46.6)
Hemoglobin: 10.3 g/dL — ABNORMAL LOW (ref 11.1–15.9)
MCH: 26.9 pg (ref 26.6–33.0)
MCHC: 31 g/dL — ABNORMAL LOW (ref 31.5–35.7)
MCV: 87 fL (ref 79–97)
Platelets: 377 10*3/uL (ref 150–450)
RBC: 3.83 x10E6/uL (ref 3.77–5.28)
RDW: 16.2 % — ABNORMAL HIGH (ref 11.7–15.4)
WBC: 6.6 10*3/uL (ref 3.4–10.8)

## 2018-12-11 LAB — LIPID PANEL
Chol/HDL Ratio: 3.7 ratio (ref 0.0–4.4)
Cholesterol, Total: 236 mg/dL — ABNORMAL HIGH (ref 100–199)
HDL: 63 mg/dL (ref >39)
LDL Calculated: 147 mg/dL — ABNORMAL HIGH (ref 0–99)
Triglycerides: 130 mg/dL (ref 0–149)
VLDL Cholesterol Cal: 26 mg/dL (ref 5–40)

## 2018-12-11 MED ORDER — OXYCODONE-ACETAMINOPHEN 7.5-325 MG PO TABS: 1.0000 | ORAL_TABLET | ORAL | 0 refills | Status: DC | PRN

## 2018-12-11 MED ORDER — TRANDOLAPRIL 2 MG PO TABS: 2.0000 mg | ORAL_TABLET | Freq: Every day | ORAL | 1 refills | Status: DC

## 2018-12-11 MED ORDER — SIMVASTATIN 80 MG PO TABS: 80.0000 mg | ORAL_TABLET | Freq: Every day | ORAL | 1 refills | Status: DC

## 2018-12-11 MED ORDER — VALACYCLOVIR HCL 1 G PO TABS: 500.0000 mg | ORAL_TABLET | Freq: Three times a day (TID) | ORAL | 3 refills | Status: DC | PRN

## 2018-12-11 NOTE — Telephone Encounter (Signed)
Spoke to patient . She states someone called  Earlier today and wanted her date of birth  But would not give her any information of why they were calling - she states she hung up and called to confirm with office. RN   Informed her she did the correct thing  And that we are schedule for a telephone visit tomorrow 4/9. She verbalized understanding

## 2018-12-11 NOTE — Telephone Encounter (Signed)
  Someone called Peggy [name] and she was unable to speak with them. She would like a call back from [place] in regards to her appt tomorrow.

## 2018-12-11 NOTE — Progress Notes (Signed)
Subjective:     Patient ID: Peggy [name] , female    DOB: 29-Apr-1946 , 73 y.o.   MRN: [id number]   Chief Complaint  Patient presents with  . Hypertension    HPI  Hyperlipidemia- she does report having muscle aches, tries to avoid fried and fatty foods.    Hypertension  This is a chronic problem. The current episode started more than 1 year ago. The problem is unchanged. The problem is controlled. Pertinent negatives include no anxiety, chest pain, malaise/fatigue, palpitations or shortness of breath. There are no associated agents to hypertension. There are no known risk factors for coronary artery disease. Past treatments include nothing. There are no compliance problems.  There is no history of angina. There is no history of chronic renal disease.     Past Medical History:  Diagnosis Date  . Anxiety   . Arthritis    "all over"  . Asthma   . Bladder cancer ([place])   . Chronic bronchitis ([place])    'get it q yr"  . Chronic lower back pain   . Daily headache   . Deafness in left ear   . Depression   . DVT (deep venous thrombosis) ([place]) 2009/2010   "probably left leg"   . GERD (gastroesophageal reflux disease)   . History of cardiac catheterization 2005, 2012   Both catheterizations for "anginal symptoms "showed angiographically normal coronary arteries. -->  Negative Myoview June 2016.  Coronary calcification noted on chest CT  . History of kidney stones   . Hypercholesteremia   . Hypertension   . Nummular eczema   . Skin cancer    "right abdomen"     Family History  Problem Relation Age of Onset  . Heart failure Mother   . Cancer Mother   . Hypertension Mother   . Hypertension Father   . Heart attack Father        Noted on cardiology intake form as "massive heart attack" at age 68  . Heart failure Sister   . Heart attack Sister        Not listed on her cardiology intake form  . Breast cancer Sister   . Heart failure Sister   . Heart attack Sister        Not  listed on her cardiology intake  . Cancer Sister   . Breast cancer Sister      Current Outpatient Medications:  .  acetaminophen (TYLENOL) 500 MG tablet, Take 1,000 mg by mouth every 8 (eight) hours as needed for moderate pain or headache. , Disp: , Rfl:  .  diphenhydrAMINE (ALLERGY RELIEF) 25 mg capsule, Take 25 mg by mouth daily., Disp: , Rfl:  .  FLUoxetine (PROZAC) 40 MG capsule, Take 40 mg by mouth daily as needed (depression)., Disp: , Rfl:  .  NON FORMULARY, [facility]  [place] Tendonitis Cream- Diclofenac 3%, Baclofen 2%, Bupivacaine 1%, Doxepin 5%, Gabapentin 6%, Ibuprofen 3%, Pentoxifylline 3% Apply 1-2 grams to affected area 3-4 times daily Qty. 120 gm 3 refills, Disp: , Rfl:  .  simvastatin (ZOCOR) 80 MG tablet, Take 1 tablet (80 mg total) by mouth at bedtime., Disp: 90 tablet, Rfl: 1 .  trandolapril (MAVIK) 2 MG tablet, Take 1 tablet (2 mg total) by mouth daily., Disp: 90 tablet, Rfl: 1 .  valACYclovir (VALTREX) 1000 MG tablet, Take 0.5 tablets (500 mg total) by mouth 3 (three) times daily as needed (for outbreak)., Disp: 90 tablet, Rfl: 3 .  oxyCODONE-acetaminophen (PERCOCET)  7.5-325 MG tablet, Take 1 tablet by mouth every 4 (four) hours as needed for severe pain. (Patient not taking: Reported on 12/11/2018), Disp: 20 tablet, Rfl: 0   Allergies  Allergen Reactions  . Penicillins Shortness Of Breath, Itching, Swelling, Rash and Other (See Comments)    Has patient had a PCN reaction causing immediate rash, facial/tongue/throat swelling, SOB or lightheadedness with hypotension: Yes Has patient had a PCN reaction causing severe rash involving mucus membranes or skin necrosis: No Has patient had a PCN reaction that required hospitalization: Yes Has patient had a PCN reaction occurring within the last 10 years: No If all of the above answers are "NO", then may proceed with Cephalosporin use.   . Azithromycin Itching, Swelling and Rash  . Levaquin [Levofloxacin In D5w] Hives,  Itching, Swelling and Rash  . Clindamycin/Lincomycin Hives  . Erythromycin Base Itching  . Keflex [Cephalexin] Itching and Rash  . Sulfa Antibiotics Itching and Rash  . Tetracyclines & Related Itching and Rash     Review of Systems  Constitutional: Negative.  Negative for fatigue and malaise/fatigue.  Respiratory: Negative for shortness of breath.   Cardiovascular: Negative for chest pain and palpitations.  Endocrine: Negative for polydipsia, polyphagia and polyuria.  Genitourinary: Negative.   Musculoskeletal: Positive for arthralgias and joint swelling.  Psychiatric/Behavioral: Negative.     Today's Vitals   12/11/18 0941  BP: 136/78  Pulse: 85  Temp: 98.4 F (36.9 C)  TempSrc: Oral  SpO2: 97%  Weight: 214 lb (97.1 kg)  Height: 4' 10.6" (1.488 m)   Body mass index is 43.81 kg/m.   Objective:  Physical Exam Vitals signs reviewed.  Constitutional:      Appearance: Normal appearance.  Cardiovascular:     Rate and Rhythm: Normal rate and regular rhythm.     Pulses: Normal pulses.     Heart sounds: Normal heart sounds. No murmur.  Musculoskeletal:        General: Swelling (left knee ) present. Deformity: swelling to left knee tender to touch.  Skin:    General: Skin is warm and dry.  Neurological:     General: No focal deficit present.     Mental Status: She is alert and oriented to person, place, and time.  Psychiatric:        Mood and Affect: Mood normal.        Behavior: Behavior normal.        Thought Content: Thought content normal.        Judgment: Judgment normal.        Assessment And Plan:   1. Essential hypertension B/P is controlled.  CMP ordered to check renal function.  The importance of regular exercise and dietary modification was stressed to the patient.  Stressed importance of losing ten percent of her body weight to help with B/P control.  The weight loss would help with decreasing cardiac and cancer risk as well.  - CMP14 + Anion  Gap  2. Mixed hyperlipidemia  Chronic, controlled  Continue with current medications  Encouraged to take magnesium regularly and to take CoQ10 daily to help with muscle aches/cramps and stay well hydrated. - Lipid Profile  3. Chronic pain of left knee  She has not been to Dr. Early Chars due to the COVID-19   Continues to have swelling to left knee  I will provide a limited supply of percocet as she awaits her appt with orthopedics  COVID-19 Education: The signs and symptoms of COVID-19 were discussed with the  patient and how to seek care for testing (follow up with PCP or arrange E-visit).  The importance of social distancing was discussed today.   Patient Risk:   After full review of this patients clinical status, I feel that they are at least moderate risk at this time.     [name], FNP

## 2018-12-12 ENCOUNTER — Telehealth (INDEPENDENT_AMBULATORY_CARE_PROVIDER_SITE_OTHER): Payer: Medicare HMO | Admitting: Cardiology

## 2018-12-12 ENCOUNTER — Encounter: Payer: Self-pay | Admitting: Cardiology

## 2018-12-12 VITALS — BP 134/70 | Ht 58.6 in | Wt 213.0 lb

## 2018-12-12 DIAGNOSIS — R011 Cardiac murmur, unspecified: Secondary | ICD-10-CM

## 2018-12-12 DIAGNOSIS — R079 Chest pain, unspecified: Secondary | ICD-10-CM | POA: Diagnosis not present

## 2018-12-12 DIAGNOSIS — I10 Essential (primary) hypertension: Secondary | ICD-10-CM | POA: Diagnosis not present

## 2018-12-12 DIAGNOSIS — G8929 Other chronic pain: Secondary | ICD-10-CM | POA: Diagnosis not present

## 2018-12-12 DIAGNOSIS — I2510 Atherosclerotic heart disease of native coronary artery without angina pectoris: Secondary | ICD-10-CM | POA: Diagnosis not present

## 2018-12-12 DIAGNOSIS — E782 Mixed hyperlipidemia: Secondary | ICD-10-CM

## 2018-12-12 DIAGNOSIS — R0989 Other specified symptoms and signs involving the circulatory and respiratory systems: Secondary | ICD-10-CM

## 2018-12-12 DIAGNOSIS — F419 Anxiety disorder, unspecified: Secondary | ICD-10-CM | POA: Diagnosis not present

## 2018-12-12 MED ORDER — FUROSEMIDE 20 MG PO TABS: 20.0000 mg | ORAL_TABLET | ORAL | 11 refills | Status: DC | PRN

## 2018-12-12 NOTE — Progress Notes (Signed)
Virtual Visit via Telephone Note   This visit type was conducted due to national recommendations for restrictions regarding the COVID-19 Pandemic (e.g. social distancing) in an effort to limit this patient's exposure and mitigate transmission in our community.  Due to her co-morbid illnesses, this patient is at least at moderate risk for complications without adequate follow up.  This format is felt to be most appropriate for this patient at this time.  The patient did not have access to video technology/had technical difficulties with video requiring transitioning to audio format only (telephone).  All issues noted in this document were discussed and addressed.  No physical exam could be performed with this format.  Please refer to the patient's chart for her  consent to telehealth for [facility].   Evaluation Performed:  Follow-up visit  Date:  12/13/2018   ID:  Peggy [name], DOB Jan 27, 1946, MRN [id number]  Patient Location: Home  Provider Location: Home  PCP:  [name], FNP  Cardiologist:  No primary care provider on file. Glenetta Hew, MD Electrophysiologist:  None   Chief Complaint: Annual follow-up  History of Present Illness:    Peggy [name] is a 73 y.o. female who presents via audio/video conferencing for a telehealth visit today.  This is an annual follow-up visit for  HTN, HLD & Fam Hx CAD with Cardiac Murmur.  Normal Coronaries by Cath x 2.  She was last seen in April 2019 and follow-up for chest pain murmur evaluation.  She had coronary calcium scoring coronary CTA done at that time along with carotid Dopplers and 2D echo (reviewed below). -We provided PRN Lasix for swelling or dyspnea.  INTERVAL HISTORY: Saw PCP yesterday - BP evaluation & blood work. L shoulder /arm pain.  Also every once & a while sharp CP  -> not associated with any activity - usually @ rest & not worse with activity.  Better with ASA. Occasionally has would have to stop & rest while walking  in [place] - b/c hurting. Maybe once a week. (not gong to store as much as usual b/c COVID-19).  The discomfort is not routine, not happening with every time she does anything.  She just is not able to get out and about as much because of the COVID-19 and has been noticing feeling little bit worse over the last couple weeks with her blood pressure going up.  Lasix helps with DOE. DOE going from bedroom to living room  -may be getting slightly worse over the last couple years, but nothing dramatic.Marland Kitchen Sleeps elevated 2-3 pillow orthopnea--chronic. Rare PND.  Not sleeping much @ night. Feels off & on rapid HR when angry or upset. No palpitations.  Occasional orthostatic dizziness. NO syncope/near syncope.  No TIA/ amaurosis fugax.    ROS:  Please see the history of present illness.    The patient does not have symptoms concerning for COVID-19 infection (fever, chills, cough, or new shortness of breath).   Review of Systems  Constitutional: Negative for chills, fever and malaise/fatigue.  HENT: Negative for congestion and nosebleeds.   Respiratory: Positive for cough (allergies (also sneezing)).   Gastrointestinal: Negative for abdominal pain, blood in stool, heartburn and melena.  Genitourinary: Negative for dysuria and hematuria.  Musculoskeletal: Positive for joint pain (L shouder).  Neurological: Positive for dizziness (per HPI).  Endo/Heme/Allergies: Positive for environmental allergies.  Psychiatric/Behavioral: The patient has insomnia (see HPI).   All other systems reviewed and are negative.   Past Medical History:  Diagnosis  Date  . Anxiety   . Arthritis    "all over"  . Asthma   . Bladder cancer ([place])   . Chronic bronchitis ([place])    'get it q yr"  . Chronic lower back pain   . Daily headache   . Deafness in left ear   . Depression   . DVT (deep venous thrombosis) ([place]) 2009/2010   "probably left leg"   . GERD (gastroesophageal reflux disease)   . History of cardiac  catheterization 2005, 2012   Both catheterizations for "anginal symptoms "showed angiographically normal coronary arteries. -->  Negative Myoview June 2016.  Coronary calcification noted on chest CT  . History of kidney stones   . Hypercholesteremia   . Hypertension   . Nummular eczema   . Skin cancer    "right abdomen"   Past Surgical History:  Procedure Laterality Date  . ABDOMINAL HYSTERECTOMY  1980's  . APPENDECTOMY Right   . BACK SURGERY     x 2, lower  . BLADDER TUMOR EXCISION    . BREAST BIOPSY Right   . BREAST EXCISIONAL BIOPSY Right   . CAROTID ARTERY DOPPLERS   10/2017   Carotid Dopplers: 40-59% stenosis on the Right Internal Carotid, < 40% stenosis in the Left. Both vertebral arteries show normal flow. Left subclavian artery is normal, but there is some flow disturbance in the right subclavian artery.  . COLONOSCOPY WITH PROPOFOL N/A 02/26/2015   Procedure: COLONOSCOPY WITH PROPOFOL;  Surgeon: Carol Ada, MD;  Location: [facility];  Service: Endoscopy;  Laterality: N/A;  . COLONOSCOPY WITH PROPOFOL N/A 12/06/2017   Procedure: COLONOSCOPY WITH PROPOFOL;  Surgeon: Juanita Craver, MD;  Location: [facility];  Service: Endoscopy;  Laterality: N/A;  . CORONARY CALCIUM SCORE & CT ANGIOGRAM  11/2017   Coronary calcium score 131 (moderate risk).  Moderate proximal LAD & LCx stenosis --> LOW RISK STUDY.  No evidence to suspect cardiac related chest pain --> correlates with cardiac catheterizations  . ENTEROSCOPY N/A 12/14/2017   Procedure: ENTEROSCOPY;  Surgeon: Carol Ada, MD;  Location: [facility];  Service: Endoscopy;  Laterality: N/A;  . ESOPHAGOGASTRODUODENOSCOPY Left 02/20/2015   Procedure: ESOPHAGOGASTRODUODENOSCOPY (EGD);  Surgeon: Milus Banister, MD;  Location: [facility];  Service: Endoscopy;  Laterality: Left;  . ESOPHAGOGASTRODUODENOSCOPY (EGD) WITH PROPOFOL N/A 12/06/2017   Procedure: ESOPHAGOGASTRODUODENOSCOPY (EGD) WITH PROPOFOL;  Surgeon: Juanita Craver, MD;   Location: [facility];  Service: Endoscopy;  Laterality: N/A;  . HOT HEMOSTASIS N/A 12/14/2017   Procedure: HOT HEMOSTASIS (ARGON PLASMA COAGULATION/BICAP);  Surgeon: Carol Ada, MD;  Location: [facility];  Service: Endoscopy;  Laterality: N/A;  . JOINT REPLACEMENT    . KNEE ARTHROSCOPY Right ~ 2000  . LEFT HEART CATH AND CORONARY ANGIOGRAPHY  11/'05; 2/'12   Angiographically normal coronary arteries  . LUMBAR DISC SURGERY  1980's X 1; ~ 2002  . MEDIAL PARTIAL KNEE REPLACEMENT Right 2001  . NM MYOVIEW LTD  02/2015   EF 65%.  No reversible ischemia.  Possible scar along the cardiac apex and anteroseptal wall.  (Mild severity, medium-sized defect in the apex anteroseptal wall noted on both resting and stress images).  No inducible ischemia.  Read as LOW RISK  . PARTIAL KNEE ARTHROPLASTY Left 2009  . REPLACEMENT TOTAL KNEE BILATERAL Bilateral   . REVISION TOTAL KNEE ARTHROPLASTY Left   . SKIN CANCER EXCISION Right    "abdomen"  . TRANSTHORACIC ECHOCARDIOGRAM  06/'16; 2/'19    A) mod Conc LVH. EF 60-65%.  No  RWMA. GR 1 D. Mild Ao Stenosis. Mild LA dilation.;  B) EF 60-65%.  Mild LVH.  No or W MA.  No significant valve disease.  Aortic stenosis not noted     Current Meds  Medication Sig  . acetaminophen (TYLENOL) 500 MG tablet Take 1,000 mg by mouth every 8 (eight) hours as needed for moderate pain or headache.   . diphenhydrAMINE (ALLERGY RELIEF) 25 mg capsule Take 25 mg by mouth daily.  Marland Kitchen FLUoxetine (PROZAC) 40 MG capsule Take 40 mg by mouth daily as needed (depression).  Marland Kitchen oxyCODONE-acetaminophen (PERCOCET) 7.5-325 MG tablet Take 1 tablet by mouth every 4 (four) hours as needed for severe pain.  . simvastatin (ZOCOR) 80 MG tablet Take 1 tablet (80 mg total) by mouth at bedtime.  . trandolapril (MAVIK) 2 MG tablet Take 1 tablet (2 mg total) by mouth daily.  . valACYclovir (VALTREX) 1000 MG tablet Take 0.5 tablets (500 mg total) by mouth 3 (three) times daily as needed (for outbreak).    -- still takes PRN Lasix  Allergies:   Penicillins; Azithromycin; Levaquin [levofloxacin in d5w]; Clindamycin/lincomycin; Erythromycin base; Keflex [cephalexin]; Sulfa antibiotics; and Tetracyclines & related   Social History   Tobacco Use  . Smoking status: Never Smoker  . Smokeless tobacco: Never Used  Substance Use Topics  . Alcohol use: No  . Drug use: No     Family Hx: The patient's family history includes Breast cancer in her sister and sister; Cancer in her mother and sister; Heart attack in her father, sister, and sister; Heart failure in her mother, sister, and sister; Hypertension in her father and mother.   Prior CV studies:   The following studies were reviewed today:  Carotid Dopplers February 2020: mild 1-39% stenosis in the right internal carotid and no left carotid stenosis. Bilateral vertebral and subclavian arteries are normal.  At this point probably would not recheck for another couple years.  Labs/Other Tests and Data Reviewed:    EKG:  No ECG reviewed.  Recent Labs: 12/11/2018: ALT 12; BUN 14; Creatinine, Ser 1.15; Hemoglobin 10.3; Platelets 377; Potassium 4.7; Sodium 144   Recent Lipid Panel Lab Results  Component Value Date/Time   CHOL 236 (H) 12/11/2018 10:14 AM   TRIG 130 12/11/2018 10:14 AM   HDL 63 12/11/2018 10:14 AM   CHOLHDL 3.7 12/11/2018 10:14 AM   CHOLHDL 4.2 12/10/2012 11:32 AM   LDLCALC 147 (H) 12/11/2018 10:14 AM    Wt Readings from Last 3 Encounters:  12/12/18 213 lb (96.6 kg)  12/11/18 214 lb (97.1 kg)  10/31/18 216 lb (98 kg)     Objective:    Vital Signs:  BP 134/70   Ht 4' 10.6" (1.488 m)   Wt 213 lb (96.6 kg)   BMI 43.61 kg/m   Seems to be in no distress over the telephone.  No increased work of breathing noted..  ASSESSMENT & PLAN:    Problem List Items Addressed This Visit    Bilateral carotid bruits (Chronic)    Carotid Dopplers in February 2020 relatively normal.  I think we can probably wait until 2022 to  reevaluate. Continue risk factor modification.      Chest pain with low risk for cardiac etiology - Primary (Chronic)    Still having off-and-on chest pain that does not seem to be that different from before.  Has now been evaluated yet again last year with a normal coronary CTA. Unlikely be cardiac in nature unless microvascular.  Suspect it  is probably more musculoskeletal now.  Unless symptoms worsen, I would probably not recommend further ischemic evaluation      Coronary artery calcification seen on CAT scan (Chronic)   Relevant Medications   furosemide (LASIX) 20 MG tablet   Essential hypertension (Chronic)    Blood pressure well controlled on [place]      Relevant Medications   furosemide (LASIX) 20 MG tablet   Mixed hyperlipidemia (Chronic)    LDL 147.  Not really at goal despite being on simvastatin high-dose.  As noted in previous note, is followed by PCP.  I would recommend potentially converting to either atorvastatin or rosuvastatin for better control.      Relevant Medications   furosemide (LASIX) 20 MG tablet   Systolic ejection murmur (Chronic)      COVID-19 Education: The signs and symptoms of COVID-19 were discussed with the patient and how to seek care for testing (follow up with PCP or arrange E-visit).  The importance of social distancing was discussed today.  Time:   Today, I have spent 14 minutes with the patient with telehealth technology discussing the above problems.     Medication Adjustments/Labs and Tests Ordered: Current medicines are reviewed at length with the patient today.  Concerns regarding medicines are outlined above.  Tests Ordered: No orders of the defined types were placed in this encounter.  Medication Changes: Meds ordered this encounter  Medications  . furosemide (LASIX) 20 MG tablet    Sig: Take 1 tablet (20 mg total) by mouth as needed. For shortness of breathe or edema( swelling)    Dispense:  15 tablet    Refill:  11     Disposition:  Follow up in 1 year(s)  Signed, Glenetta Hew, MD  12/13/2018 2:52 PM    [facility]

## 2018-12-12 NOTE — Patient Instructions (Addendum)
The proverbial horse is left a bar something we do recall about golf is like now medication Instructions:   We will refill PRN Lasix 20 mg daily for Shortness of breathe/Edema  If you need a refill on your cardiac medications before your next appointment, please call your pharmacy.   Lab work: Not needed If you have labs (blood work) drawn today and your tests are completely normal, you will receive your results only by: Marland Kitchen MyChart Message (if you have MyChart) OR . A paper copy in the mail If you have any lab test that is abnormal or we need to change your treatment, we will call you to review the results.  Testing/Procedures: Not needed  Follow-Up: At [facility], you and your health needs are our priority.  As part of our continuing mission to provide you with exceptional heart care, we have created designated Provider Care Teams.  These Care Teams include your primary Cardiologist (physician) and Advanced Practice Providers (APPs -  Physician Assistants and Nurse Practitioners) who all work together to provide you with the care you need, when you need it. You will need a follow up appointment in 12 months April 2020.  Please call our office 2 months in advance to schedule this appointment.  You may see Dr Glenetta Hew or one of the following Advanced Practice Providers on your designated Care Team:   Rosaria Ferries, PA-C . Jory Sims, DNP, ANP  Any Other Special Instructions Will Be Listed Below (If Applicable).   Was

## 2018-12-13 ENCOUNTER — Encounter: Payer: Self-pay | Admitting: Cardiology

## 2018-12-13 NOTE — Assessment & Plan Note (Signed)
LDL 147.  Not really at goal despite being on simvastatin high-dose.  As noted in previous note, is followed by PCP.  I would recommend potentially converting to either atorvastatin or rosuvastatin for better control.

## 2018-12-13 NOTE — Assessment & Plan Note (Signed)
Still having off-and-on chest pain that does not seem to be that different from before.  Has now been evaluated yet again last year with a normal coronary CTA. Unlikely be cardiac in nature unless microvascular.  Suspect it is probably more musculoskeletal now.  Unless symptoms worsen, I would probably not recommend further ischemic evaluation

## 2018-12-13 NOTE — Assessment & Plan Note (Signed)
Carotid Dopplers in February 2020 relatively normal.  I think we can probably wait until 2022 to reevaluate. Continue risk factor modification.

## 2018-12-13 NOTE — Assessment & Plan Note (Signed)
Blood pressure well controlled on [place]

## 2018-12-19 ENCOUNTER — Ambulatory Visit: Payer: Medicare HMO | Admitting: Sports Medicine

## 2018-12-26 ENCOUNTER — Encounter: Payer: Self-pay | Admitting: Nurse Practitioner

## 2018-12-26 ENCOUNTER — Ambulatory Visit (INDEPENDENT_AMBULATORY_CARE_PROVIDER_SITE_OTHER): Payer: Medicare HMO | Admitting: Nurse Practitioner

## 2018-12-26 ENCOUNTER — Other Ambulatory Visit: Payer: Self-pay

## 2018-12-26 ENCOUNTER — Ambulatory Visit
Admission: RE | Admit: 2018-12-26 | Discharge: 2018-12-26 | Disposition: A | Discharge status: Home / Self Care | Payer: Medicare HMO | Source: Ambulatory Visit | Attending: Nurse Practitioner | Admitting: Nurse Practitioner

## 2018-12-26 VITALS — BP 144/70 | HR 85 | Temp 98.2°F | Ht 62.4 in | Wt 216.6 lb

## 2018-12-26 DIAGNOSIS — M549 Dorsalgia, unspecified: Secondary | ICD-10-CM

## 2018-12-26 DIAGNOSIS — M542 Cervicalgia: Secondary | ICD-10-CM

## 2018-12-26 DIAGNOSIS — M545 Low back pain: Secondary | ICD-10-CM | POA: Diagnosis not present

## 2018-12-26 DIAGNOSIS — M546 Pain in thoracic spine: Secondary | ICD-10-CM | POA: Diagnosis not present

## 2018-12-26 MED ORDER — DICLOFENAC SODIUM 1 % TD GEL: 2.0000 g | Freq: Four times a day (QID) | TRANSDERMAL | 2 refills | Status: DC

## 2018-12-26 MED ORDER — CYCLOBENZAPRINE HCL 10 MG PO TABS: 10.0000 mg | ORAL_TABLET | Freq: Three times a day (TID) | ORAL | 0 refills | Status: DC | PRN

## 2018-12-26 NOTE — Progress Notes (Signed)
Subjective:     Patient ID: Peggy [name] , female    DOB: 1945-09-29 , 73 y.o.   MRN: [id number]   Chief Complaint  Patient presents with  . Back Pain    patient states the pain is so bad she is unable to sit down. she would like an MRI    HPI  Yesterday her pain was terrible unable to lay down.  She has been taking Tylenol and Oxycodone. Sharp pain to neck and spine bilaterally.  Numbness to right arm.  Mattress is about 75 years old.    Back Pain  This is a chronic problem. The current episode started 1 to 4 weeks ago. The problem occurs constantly. The problem has been gradually worsening since onset. The pain is present in the thoracic spine. The pain does not radiate. The pain is at a severity of 10/10. The pain is the same all the time. Pertinent negatives include no chest pain, fever or headaches. She has tried nothing for the symptoms.  Neck Pain   This is a new problem. The current episode started 1 to 4 weeks ago. The problem occurs constantly. The problem has been gradually worsening. The quality of the pain is described as aching and stabbing. Nothing aggravates the symptoms. The pain is same all the time. Pertinent negatives include no chest pain, fever or headaches. She has tried nothing for the symptoms.     Past Medical History:  Diagnosis Date  . Anxiety   . Arthritis    "all over"  . Asthma   . Bladder cancer ([place])   . Chronic bronchitis ([place])    'get it q yr"  . Chronic lower back pain   . Daily headache   . Deafness in left ear   . Depression   . DVT (deep venous thrombosis) ([place]) 2009/2010   "probably left leg"   . GERD (gastroesophageal reflux disease)   . History of cardiac catheterization 2005, 2012   Both catheterizations for "anginal symptoms "showed angiographically normal coronary arteries. -->  Negative Myoview June 2016.  Coronary calcification noted on chest CT  . History of kidney stones   . Hypercholesteremia   . Hypertension   . Nummular  eczema   . Skin cancer    "right abdomen"     Family History  Problem Relation Age of Onset  . Heart failure Mother   . Cancer Mother   . Hypertension Mother   . Hypertension Father   . Heart attack Father        Noted on cardiology intake form as "massive heart attack" at age 42  . Heart failure Sister   . Heart attack Sister        Not listed on her cardiology intake form  . Breast cancer Sister   . Heart failure Sister   . Heart attack Sister        Not listed on her cardiology intake  . Cancer Sister   . Breast cancer Sister      Current Outpatient Medications:  .  acetaminophen (TYLENOL) 500 MG tablet, Take 1,000 mg by mouth every 8 (eight) hours as needed for moderate pain or headache. , Disp: , Rfl:  .  diphenhydrAMINE (ALLERGY RELIEF) 25 mg capsule, Take 25 mg by mouth daily., Disp: , Rfl:  .  FLUoxetine (PROZAC) 40 MG capsule, Take 40 mg by mouth daily as needed (depression)., Disp: , Rfl:  .  furosemide (LASIX) 20 MG tablet, Take 1 tablet (  20 mg total) by mouth as needed. For shortness of breathe or edema( swelling), Disp: 15 tablet, Rfl: 11 .  oxyCODONE-acetaminophen (PERCOCET) 7.5-325 MG tablet, Take 1 tablet by mouth every 4 (four) hours as needed for severe pain., Disp: 20 tablet, Rfl: 0 .  simvastatin (ZOCOR) 80 MG tablet, Take 1 tablet (80 mg total) by mouth at bedtime., Disp: 90 tablet, Rfl: 1 .  trandolapril (MAVIK) 2 MG tablet, Take 1 tablet (2 mg total) by mouth daily., Disp: 90 tablet, Rfl: 1 .  valACYclovir (VALTREX) 1000 MG tablet, Take 0.5 tablets (500 mg total) by mouth 3 (three) times daily as needed (for outbreak)., Disp: 90 tablet, Rfl: 3   Allergies  Allergen Reactions  . Penicillins Shortness Of Breath, Itching, Swelling, Rash and Other (See Comments)    Has patient had a PCN reaction causing immediate rash, facial/tongue/throat swelling, SOB or lightheadedness with hypotension: Yes Has patient had a PCN reaction causing severe rash involving mucus  membranes or skin necrosis: No Has patient had a PCN reaction that required hospitalization: Yes Has patient had a PCN reaction occurring within the last 10 years: No If all of the above answers are "NO", then may proceed with Cephalosporin use.   . Azithromycin Itching, Swelling and Rash  . Levaquin [Levofloxacin In D5w] Hives, Itching, Swelling and Rash  . Clindamycin/Lincomycin Hives  . Erythromycin Base Itching  . Keflex [Cephalexin] Itching and Rash  . Sulfa Antibiotics Itching and Rash  . Tetracyclines & Related Itching and Rash     Review of Systems  Constitutional: Negative for fever.  Cardiovascular: Negative for chest pain.  Musculoskeletal: Positive for back pain and neck pain.  Neurological: Negative for dizziness and headaches.     Today's Vitals   12/26/18 1148  BP: (!) 144/70  Pulse: 85  Temp: 98.2 F (36.8 C)  TempSrc: Oral  Weight: 216 lb 9.6 oz (98.2 kg)  Height: 5' 2.4" (1.585 m)  PainSc: 10-Worst pain ever  PainLoc: Back   Body mass index is 39.11 kg/m.   Objective:  Physical Exam Vitals signs reviewed.  Constitutional:      Appearance: Normal appearance.  Cardiovascular:     Rate and Rhythm: Normal rate and regular rhythm.     Pulses: Normal pulses.     Heart sounds: Normal heart sounds. No murmur.  Pulmonary:     Effort: Pulmonary effort is normal. No respiratory distress.     Breath sounds: Normal breath sounds.  Musculoskeletal:     Comments: Tenderness to right lateral back area She is using a cane Right lateral neck tension noted  Skin:    General: Skin is warm and dry.     Capillary Refill: Capillary refill takes less than 2 seconds.  Neurological:     General: No focal deficit present.     Mental Status: She is alert and oriented to person, place, and time.  Psychiatric:        Mood and Affect: Mood normal.        Behavior: Behavior normal.        Thought Content: Thought content normal.        Judgment: Judgment normal.          Assessment And Plan:  1. Neck pain, acute  No abnormal findings neurologically  She has tension to her supraspinus area  Will check cervical xray - cyclobenzaprine (FLEXERIL) 10 MG tablet; Take 1 tablet (10 mg total) by mouth 3 (three) times daily as needed for muscle  spasms.  Dispense: 30 tablet; Refill: 0 - DG Cervical Spine Complete; Future - diclofenac sodium (VOLTAREN) 1 % GEL; Apply 2 g topically 4 (four) times daily.  Dispense: 100 g; Refill: 2  2. Acute back pain, unspecified back location, unspecified back pain laterality  Tension noted to right lateral back this is likely a muscle spasm  Will check thoracic and lumbar xray   Negative for tingling or numbness - cyclobenzaprine (FLEXERIL) 10 MG tablet; Take 1 tablet (10 mg total) by mouth 3 (three) times daily as needed for muscle spasms.  Dispense: 30 tablet; Refill: 0 - DG Thoracic Spine 2 View; Future - DG Lumbar Spine Complete; Future - diclofenac sodium (VOLTAREN) 1 % GEL; Apply 2 g topically 4 (four) times daily.  Dispense: 100 g; Refill: 2  [name], FNP    THE PATIENT IS ENCOURAGED TO PRACTICE SOCIAL DISTANCING DUE TO THE COVID-19 PANDEMIC.

## 2018-12-30 NOTE — Progress Notes (Signed)
Okay noted

## 2019-02-13 IMAGING — NM NM HEPATO W/GB/PHARM/[PERSON_NAME]
3 series · 13 of 13 positions shown · non-contrast
Comparison: None.

CLINICAL DATA: Right upper quadrant pain

EXAM:
NUCLEAR MEDICINE HEPATOBILIARY IMAGING WITH GALLBLADDER EF
TECHNIQUE: Sequential images of the abdomen were obtained [DATE] minutes
following intravenous administration of radiopharmaceutical. After
oral ingestion of 8 ounces of Ensure, gallbladder ejection fraction
was determined.
RADIOPHARMACEUTICALS:  5.3 mCi Tc-TTm Choletec IV

[Series 1: biliary · 4.14mm/px · 6 of 60 frames shown]
[frame 6/60]
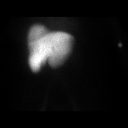
[frame 16/60]
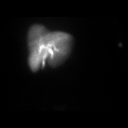
[frame 26/60]
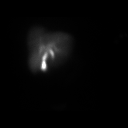
[frame 36/60]
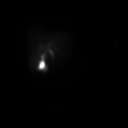
[frame 46/60]
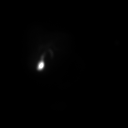
[frame 56/60]
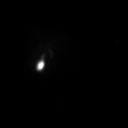

[Series 2: rt lat · 4.14mm/px · 1 of 1 slices shown]
[im 1/1]
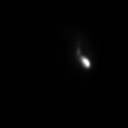

[Series 3: gbef · 4.14mm/px · 6 of 60 frames shown]
[frame 6/60]
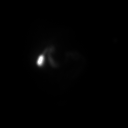
[frame 16/60]
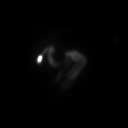
[frame 26/60]
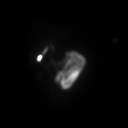
[frame 36/60]
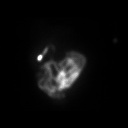
[frame 46/60]
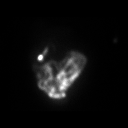
[frame 56/60]
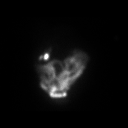

[13 of 13 positions shown; findings below may reference images not displayed]

FINDINGS: Prompt uptake and biliary excretion of activity by the liver is
seen. Gallbladder activity is visualized, consistent with patency of
cystic duct. Biliary activity passes into small bowel, consistent
with patent common bile duct.

Calculated gallbladder ejection fraction is 93%. (Normal gallbladder
ejection fraction with Ensure is greater than 33%.)
IMPRESSION: Normal hepatobiliary scan.

## 2019-03-13 ENCOUNTER — Ambulatory Visit: Payer: Medicare HMO | Admitting: Nurse Practitioner

## 2019-03-13 ENCOUNTER — Encounter: Payer: Medicare HMO | Admitting: Nurse Practitioner

## 2019-03-31 ENCOUNTER — Other Ambulatory Visit: Payer: Self-pay

## 2019-03-31 ENCOUNTER — Ambulatory Visit (INDEPENDENT_AMBULATORY_CARE_PROVIDER_SITE_OTHER): Payer: Medicare HMO | Admitting: Nurse Practitioner

## 2019-03-31 ENCOUNTER — Encounter: Payer: Self-pay | Admitting: Nurse Practitioner

## 2019-03-31 VITALS — BP 132/64 | HR 83 | Temp 98.3°F | Ht 62.6 in | Wt 218.0 lb

## 2019-03-31 DIAGNOSIS — E782 Mixed hyperlipidemia: Secondary | ICD-10-CM

## 2019-03-31 DIAGNOSIS — M25562 Pain in left knee: Secondary | ICD-10-CM

## 2019-03-31 DIAGNOSIS — M25561 Pain in right knee: Secondary | ICD-10-CM

## 2019-03-31 DIAGNOSIS — M7989 Other specified soft tissue disorders: Secondary | ICD-10-CM

## 2019-03-31 DIAGNOSIS — G8929 Other chronic pain: Secondary | ICD-10-CM | POA: Diagnosis not present

## 2019-03-31 DIAGNOSIS — Z0000 Encounter for general adult medical examination without abnormal findings: Secondary | ICD-10-CM | POA: Diagnosis not present

## 2019-03-31 DIAGNOSIS — E559 Vitamin D deficiency, unspecified: Secondary | ICD-10-CM | POA: Diagnosis not present

## 2019-03-31 DIAGNOSIS — I10 Essential (primary) hypertension: Secondary | ICD-10-CM

## 2019-03-31 LAB — POCT URINALYSIS DIPSTICK
Bilirubin, UA: NEGATIVE
Blood, UA: NEGATIVE
Glucose, UA: NEGATIVE
Ketones, UA: NEGATIVE
Nitrite, UA: NEGATIVE
Protein, UA: NEGATIVE
Spec Grav, UA: 1.015 (ref 1.010–1.025)
Urobilinogen, UA: 0.2 E.U./dL
pH, UA: 5.5 (ref 5.0–8.0)

## 2019-03-31 LAB — POCT UA - MICROALBUMIN
Albumin/Creatinine Ratio, Urine, POC: 30
Creatinine, POC: 100 mg/dL
Microalbumin Ur, POC: 30 mg/L

## 2019-03-31 MED ORDER — NYSTATIN 100000 UNIT/GM EX POWD: Freq: Four times a day (QID) | CUTANEOUS | 0 refills | Status: DC

## 2019-03-31 NOTE — Patient Instructions (Addendum)
Health Maintenance  Topic Date Due  . Hepatitis C Screening  October 07, 1945  . PNA vac Low Risk Adult (2 of 2 - PPSV23) 06/24/2014  . INFLUENZA VACCINE  04/05/2019  . TETANUS/TDAP  11/20/2027  . COLONOSCOPY  12/07/2027  . DEXA SCAN  Completed    Health Maintenance, Female Adopting a healthy lifestyle and getting preventive care are important in promoting health and wellness. Ask your health care provider about:  The right schedule for you to have regular tests and exams.  Things you can do on your own to prevent diseases and keep yourself healthy. What should I know about diet, weight, and exercise? Eat a healthy diet   Eat a diet that includes plenty of vegetables, fruits, low-fat dairy products, and lean protein.  Do not eat a lot of foods that are high in solid fats, added sugars, or sodium. Maintain a healthy weight Body mass index (BMI) is used to identify weight problems. It estimates body fat based on height and weight. Your health care provider can help determine your BMI and help you achieve or maintain a healthy weight. Get regular exercise Get regular exercise. This is one of the most important things you can do for your health. Most adults should:  Exercise for at least 150 minutes each week. The exercise should increase your heart rate and make you sweat (moderate-intensity exercise).  Do strengthening exercises at least twice a week. This is in addition to the moderate-intensity exercise.  Spend less time sitting. Even light physical activity can be beneficial. Watch cholesterol and blood lipids Have your blood tested for lipids and cholesterol at 75 years of age, then have this test every 5 years. Have your cholesterol levels checked more often if:  Your lipid or cholesterol levels are high.  You are older than 75 years of age.  You are at high risk for heart disease. What should I know about cancer screening? Depending on your health history and family history,  you may need to have cancer screening at various ages. This may include screening for:  Breast cancer.  Cervical cancer.  Colorectal cancer.  Skin cancer.  Lung cancer. What should I know about heart disease, diabetes, and high blood pressure? Blood pressure and heart disease  High blood pressure causes heart disease and increases the risk of stroke. This is more likely to develop in people who have high blood pressure readings, are of African descent, or are overweight.  Have your blood pressure checked: ? Every 3-5 years if you are 6-35 years of age. ? Every year if you are 32 years old or older. Diabetes Have regular diabetes screenings. This checks your fasting blood sugar level. Have the screening done:  Once every three years after age 55 if you are at a normal weight and have a low risk for diabetes.  More often and at a younger age if you are overweight or have a high risk for diabetes. What should I know about preventing infection? Hepatitis B If you have a higher risk for hepatitis B, you should be screened for this virus. Talk with your health care provider to find out if you are at risk for hepatitis B infection. Hepatitis C Testing is recommended for:  Everyone born from [birth year] through 1967.  Anyone with known risk factors for hepatitis C. Sexually transmitted infections (STIs)  Get screened for STIs, including gonorrhea and chlamydia, if: ? You are sexually active and are younger than 75 years of age. ? You  are older than 75 years of age and your health care provider tells you that you are at risk for this type of infection. ? Your sexual activity has changed since you were last screened, and you are at increased risk for chlamydia or gonorrhea. Ask your health care provider if you are at risk.  Ask your health care provider about whether you are at high risk for HIV. Your health care provider may recommend a prescription medicine to help prevent HIV infection.  If you choose to take medicine to prevent HIV, you should first get tested for HIV. You should then be tested every 3 months for as long as you are taking the medicine. Pregnancy  If you are about to stop having your period (premenopausal) and you may become pregnant, seek counseling before you get pregnant.  Take 400 to 800 micrograms (mcg) of folic acid every day if you become pregnant.  Ask for birth control (contraception) if you want to prevent pregnancy. Osteoporosis and menopause Osteoporosis is a disease in which the bones lose minerals and strength with aging. This can result in bone fractures. If you are 66 years old or older, or if you are at risk for osteoporosis and fractures, ask your health care provider if you should:  Be screened for bone loss.  Take a calcium or vitamin D supplement to lower your risk of fractures.  Be given hormone replacement therapy (HRT) to treat symptoms of menopause. Follow these instructions at home: Lifestyle  Do not use any products that contain nicotine or tobacco, such as cigarettes, e-cigarettes, and chewing tobacco. If you need help quitting, ask your health care provider.  Do not use street drugs.  Do not share needles.  Ask your health care provider for help if you need support or information about quitting drugs. Alcohol use  Do not drink alcohol if: ? Your health care provider tells you not to drink. ? You are pregnant, may be pregnant, or are planning to become pregnant.  If you drink alcohol: ? Limit how much you use to 0-1 drink a day. ? Limit intake if you are breastfeeding.  Be aware of how much alcohol is in your drink. In the [place] one drink equals one 12 oz bottle of beer (355 mL), one 5 oz glass of wine (148 mL), or one 1 oz glass of hard liquor (44 mL). General instructions  Schedule regular health, dental, and eye exams.  Stay current with your vaccines.  Tell your health care provider if: ? You often feel  depressed. ? You have ever been abused or do not feel safe at home. Summary  Adopting a healthy lifestyle and getting preventive care are important in promoting health and wellness.  Follow your health care provider's instructions about healthy diet, exercising, and getting tested or screened for diseases.  Follow your health care provider's instructions on monitoring your cholesterol and blood pressure. This information is not intended to replace advice given to you by your health care provider. Make sure you discuss any questions you have with your health care provider. Document Released: 03/06/2011 Document Revised: 08/14/2018 Document Reviewed: 08/14/2018 Elsevier Patient Education  2020 [employer].   Take 2 tabs of furosemide daily for 3 days in a row to see if this helps her swelling in lower extremity.  This swelling may also be related to your chronic knee pain.

## 2019-03-31 NOTE — Progress Notes (Addendum)
Subjective:     Patient ID: Peggy [name] , female    DOB: February 06, 1946 , 73 y.o.   MRN: [id number]   Chief Complaint  Patient presents with  . Annual Exam   The patient states she uses status post hysterectomy for birth control. Last LMP was No LMP recorded. Patient has had a hysterectomy.. Negative for Dysmenorrhea and Negative for Menorrhagia Mammogram last done 03/13/2018.  Negative for: breast discharge, breast lump(s), breast pain and breast self exam.  Pertinent negatives include abnormal bleeding (hematology), anxiety, decreased libido, depression, difficulty falling sleep, dyspareunia, history of infertility, nocturia, sexual dysfunction, sleep disturbances, urinary incontinence, urinary urgency, vaginal discharge and vaginal itching. Diet regular. The patient states her exercise level is  doing the bicycle in place as tolerated    The patient's tobacco use is:  Social History   Tobacco Use  Smoking Status Never Smoker  Smokeless Tobacco Never Used  . She has been exposed to passive smoke. The patient's alcohol use is:  Social History   Substance and Sexual Activity  Alcohol Use No    HPI  She is having "swelling to her lower extremities".    Hypertension This is a chronic problem. The current episode started more than 1 year ago. The problem is unchanged. The problem is uncontrolled. Pertinent negatives include no anxiety, blurred vision, chest pain, headaches, malaise/fatigue or palpitations. The current treatment provides no improvement. There are no compliance problems.  There is no history of angina. There is no history of chronic renal disease.     Past Medical History:  Diagnosis Date  . Anxiety   . Arthritis    "all over"  . Asthma   . Bladder cancer ([place])   . Chronic bronchitis ([place])    'get it q yr"  . Chronic lower back pain   . Daily headache   . Deafness in left ear   . Depression   . DVT (deep venous thrombosis) ([place]) 2009/2010   "probably left leg"    . GERD (gastroesophageal reflux disease)   . History of cardiac catheterization 2005, 2012   Both catheterizations for "anginal symptoms "showed angiographically normal coronary arteries. -->  Negative Myoview June 2016.  Coronary calcification noted on chest CT  . History of kidney stones   . Hypercholesteremia   . Hypertension   . Nummular eczema   . Skin cancer    "right abdomen"     Family History  Problem Relation Age of Onset  . Heart failure Mother   . Cancer Mother   . Hypertension Mother   . Hypertension Father   . Heart attack Father        Noted on cardiology intake form as "massive heart attack" at age 59  . Heart failure Sister   . Heart attack Sister        Not listed on her cardiology intake form  . Breast cancer Sister   . Heart failure Sister   . Heart attack Sister        Not listed on her cardiology intake  . Cancer Sister   . Breast cancer Sister      Current Outpatient Medications:  .  acetaminophen (TYLENOL) 500 MG tablet, Take 1,000 mg by mouth every 8 (eight) hours as needed for moderate pain or headache. , Disp: , Rfl:  .  diphenhydrAMINE (ALLERGY RELIEF) 25 mg capsule, Take 25 mg by mouth daily., Disp: , Rfl:  .  FLUoxetine (PROZAC) 40 MG capsule, Take 40  mg by mouth daily as needed (depression)., Disp: , Rfl:  .  furosemide (LASIX) 20 MG tablet, Take 1 tablet (20 mg total) by mouth as needed. For shortness of breathe or edema( swelling), Disp: 15 tablet, Rfl: 11 .  simvastatin (ZOCOR) 80 MG tablet, Take 1 tablet (80 mg total) by mouth at bedtime., Disp: 90 tablet, Rfl: 1 .  trandolapril (MAVIK) 2 MG tablet, Take 1 tablet (2 mg total) by mouth daily., Disp: 90 tablet, Rfl: 1 .  valACYclovir (VALTREX) 1000 MG tablet, Take 0.5 tablets (500 mg total) by mouth 3 (three) times daily as needed (for outbreak)., Disp: 90 tablet, Rfl: 3 .  diclofenac sodium (VOLTAREN) 1 % GEL, Apply 2 g topically 4 (four) times daily. (Patient not taking: Reported on  03/31/2019), Disp: 100 g, Rfl: 2   Allergies  Allergen Reactions  . Penicillins Shortness Of Breath, Itching, Swelling, Rash and Other (See Comments)    Has patient had a PCN reaction causing immediate rash, facial/tongue/throat swelling, SOB or lightheadedness with hypotension: Yes Has patient had a PCN reaction causing severe rash involving mucus membranes or skin necrosis: No Has patient had a PCN reaction that required hospitalization: Yes Has patient had a PCN reaction occurring within the last 10 years: No If all of the above answers are "NO", then may proceed with Cephalosporin use.   . Azithromycin Itching, Swelling and Rash  . Levaquin [Levofloxacin In D5w] Hives, Itching, Swelling and Rash  . Clindamycin/Lincomycin Hives  . Erythromycin Base Itching  . Keflex [Cephalexin] Itching and Rash  . Sulfa Antibiotics Itching and Rash  . Tetracyclines & Related Itching and Rash     Review of Systems  Constitutional: Negative.  Negative for malaise/fatigue.  HENT: Negative.   Eyes: Negative.  Negative for blurred vision.  Respiratory: Negative.   Cardiovascular: Negative.  Negative for chest pain, palpitations and leg swelling.  Endocrine: Negative.   Genitourinary: Negative.   Musculoskeletal: Positive for gait problem and joint swelling. Negative for arthralgias and myalgias.  Skin: Negative.   Neurological: Negative for dizziness and headaches.  Hematological: Negative.   Psychiatric/Behavioral: Negative.      Today's Vitals   03/31/19 1001  BP: 132/64  Pulse: 83  Temp: 98.3 F (36.8 C)  TempSrc: Oral  Weight: 218 lb (98.9 kg)  Height: 5' 2.6" (1.59 m)  PainSc: 10-Worst pain ever  PainLoc: Knee   Body mass index is 39.11 kg/m.   Objective:  Physical Exam Vitals signs reviewed.  Constitutional:      Appearance: Normal appearance.  HENT:     Head: Normocephalic and atraumatic.  Neck:     Musculoskeletal: Normal range of motion and neck supple. No neck  rigidity.  Cardiovascular:     Rate and Rhythm: Normal rate and regular rhythm.     Pulses: Normal pulses.     Heart sounds: Normal heart sounds. No murmur.  Pulmonary:     Effort: Pulmonary effort is normal. No respiratory distress.     Breath sounds: Normal breath sounds.  Abdominal:     General: Abdomen is flat. Bowel sounds are normal.     Palpations: Abdomen is soft.  Musculoskeletal:        General: Swelling, tenderness (bilateral knees ) and deformity present.     Right lower leg: Edema present.     Left lower leg: Edema present.  Skin:    General: Skin is warm and dry.     Capillary Refill: Capillary refill takes less than 2  seconds.  Neurological:     General: No focal deficit present.     Mental Status: She is alert and oriented to person, place, and time.  Psychiatric:        Mood and Affect: Mood normal.        Behavior: Behavior normal.        Thought Content: Thought content normal.        Judgment: Judgment normal.         Assessment And Plan:     1. Health maintenance examination . Behavior modifications discussed and diet history reviewed.   . Pt will continue to exercise regularly and modify diet with low GI, plant based foods and decrease intake of processed foods.  . Recommend intake of daily multivitamin, Vitamin D, and calcium.  . Recommend mammogram and colonoscopy for preventive screenings, as well as recommend immunizations that include  TDAP - Vitamin D (25 hydroxy)  2. Essential hypertension . B/P is controlled.  . CMP ordered to check renal function.  . The importance of regular exercise and dietary modification was stressed to the patient.  . Stressed importance of losing ten percent of her body weight to help with B/P control.  . The weight loss would help with decreasing cardiac and cancer risk as well . EKG done, no changes from previous - POCT Urinalysis Dipstick (81002) - POCT UA - Microalbumin - EKG 12-Lead - BMP8+Anion Gap  3.  Mixed hyperlipidemia  Chronic, controlled  Continue with current medications - Lipid Profile - CBC no Diff  4. Chronic pain of both knees  She has not been to the orthopedic yet due to covid  I have advise her to contact them since they are likely seeing patients in the office  5. Leg swelling  She has swelling to her legs with the left more than right   Could be related to her knees   Cardiology increased her furosemide to 20mg  as needed  I encouraged her to take it at least 3 days in a row to see if this helps   [name], FNP    THE PATIENT IS ENCOURAGED TO PRACTICE SOCIAL DISTANCING DUE TO THE COVID-19 PANDEMIC.

## 2019-04-01 ENCOUNTER — Other Ambulatory Visit: Payer: Self-pay

## 2019-04-01 LAB — BMP8+ANION GAP
Anion Gap: 15 mmol/L (ref 10.0–18.0)
BUN/Creatinine Ratio: 13 (ref 12–28)
BUN: 13 mg/dL (ref 8–27)
CO2: 23 mmol/L (ref 20–29)
Calcium: 9.2 mg/dL (ref 8.7–10.3)
Chloride: 103 mmol/L (ref 96–106)
Creatinine, Ser: 0.97 mg/dL (ref 0.57–1.00)
GFR calc Af Amer: 66 mL/min/{1.73_m2} (ref >59)
GFR calc non Af Amer: 57 mL/min/{1.73_m2} — ABNORMAL LOW (ref >59)
Glucose: 82 mg/dL (ref 65–99)
Potassium: 4.5 mmol/L (ref 3.5–5.2)
Sodium: 141 mmol/L (ref 134–144)

## 2019-04-01 LAB — CBC
Hematocrit: 31.3 % — ABNORMAL LOW (ref 34.0–46.6)
Hemoglobin: 10.1 g/dL — ABNORMAL LOW (ref 11.1–15.9)
MCH: 28.1 pg (ref 26.6–33.0)
MCHC: 32.3 g/dL (ref 31.5–35.7)
MCV: 87 fL (ref 79–97)
Platelets: 314 10*3/uL (ref 150–450)
RBC: 3.6 x10E6/uL — ABNORMAL LOW (ref 3.77–5.28)
RDW: 14.6 % (ref 11.7–15.4)
WBC: 8.1 10*3/uL (ref 3.4–10.8)

## 2019-04-01 LAB — VITAMIN D 25 HYDROXY (VIT D DEFICIENCY, FRACTURES): Vit D, 25-Hydroxy: 17.8 ng/mL — ABNORMAL LOW (ref 30.0–100.0)

## 2019-04-01 LAB — LIPID PANEL
Chol/HDL Ratio: 2.5 ratio (ref 0.0–4.4)
Cholesterol, Total: 156 mg/dL (ref 100–199)
HDL: 62 mg/dL (ref >39)
LDL Calculated: 81 mg/dL (ref 0–99)
Triglycerides: 66 mg/dL (ref 0–149)
VLDL Cholesterol Cal: 13 mg/dL (ref 5–40)

## 2019-04-01 MED ORDER — VITAMIN D (ERGOCALCIFEROL) 1.25 MG (50000 UNIT) PO CAPS: ORAL_CAPSULE | ORAL | 0 refills | Status: DC

## 2019-04-02 DIAGNOSIS — F419 Anxiety disorder, unspecified: Secondary | ICD-10-CM | POA: Diagnosis not present

## 2019-05-14 DIAGNOSIS — H40013 Open angle with borderline findings, low risk, bilateral: Secondary | ICD-10-CM | POA: Diagnosis not present

## 2019-05-14 DIAGNOSIS — H43392 Other vitreous opacities, left eye: Secondary | ICD-10-CM | POA: Diagnosis not present

## 2019-05-14 DIAGNOSIS — H25813 Combined forms of age-related cataract, bilateral: Secondary | ICD-10-CM | POA: Diagnosis not present

## 2019-05-14 DIAGNOSIS — H10413 Chronic giant papillary conjunctivitis, bilateral: Secondary | ICD-10-CM | POA: Diagnosis not present

## 2019-06-04 DIAGNOSIS — H10413 Chronic giant papillary conjunctivitis, bilateral: Secondary | ICD-10-CM | POA: Diagnosis not present

## 2019-06-04 DIAGNOSIS — H43392 Other vitreous opacities, left eye: Secondary | ICD-10-CM | POA: Diagnosis not present

## 2019-06-04 DIAGNOSIS — H25813 Combined forms of age-related cataract, bilateral: Secondary | ICD-10-CM | POA: Diagnosis not present

## 2019-06-04 DIAGNOSIS — H40013 Open angle with borderline findings, low risk, bilateral: Secondary | ICD-10-CM | POA: Diagnosis not present

## 2019-06-04 DIAGNOSIS — H43812 Vitreous degeneration, left eye: Secondary | ICD-10-CM | POA: Diagnosis not present

## 2019-07-01 ENCOUNTER — Ambulatory Visit (INDEPENDENT_AMBULATORY_CARE_PROVIDER_SITE_OTHER): Payer: Medicare HMO | Admitting: Nurse Practitioner

## 2019-07-01 ENCOUNTER — Other Ambulatory Visit: Payer: Self-pay | Admitting: Nurse Practitioner

## 2019-07-01 ENCOUNTER — Ambulatory Visit: Payer: Medicare HMO

## 2019-07-01 ENCOUNTER — Other Ambulatory Visit: Payer: Self-pay

## 2019-07-01 ENCOUNTER — Encounter: Payer: Self-pay | Admitting: Nurse Practitioner

## 2019-07-01 VITALS — BP 142/80 | HR 79 | Temp 98.6°F | Ht 61.8 in | Wt 211.8 lb

## 2019-07-01 DIAGNOSIS — E559 Vitamin D deficiency, unspecified: Secondary | ICD-10-CM | POA: Diagnosis not present

## 2019-07-01 DIAGNOSIS — Z1159 Encounter for screening for other viral diseases: Secondary | ICD-10-CM

## 2019-07-01 DIAGNOSIS — G8929 Other chronic pain: Secondary | ICD-10-CM

## 2019-07-01 DIAGNOSIS — Z9181 History of falling: Secondary | ICD-10-CM | POA: Diagnosis not present

## 2019-07-01 DIAGNOSIS — E782 Mixed hyperlipidemia: Secondary | ICD-10-CM

## 2019-07-01 DIAGNOSIS — M25562 Pain in left knee: Secondary | ICD-10-CM

## 2019-07-01 DIAGNOSIS — I10 Essential (primary) hypertension: Secondary | ICD-10-CM

## 2019-07-01 DIAGNOSIS — Z23 Encounter for immunization: Secondary | ICD-10-CM | POA: Diagnosis not present

## 2019-07-01 MED ORDER — SIMVASTATIN 80 MG PO TABS: 80.0000 mg | ORAL_TABLET | Freq: Every day | ORAL | 1 refills | Status: DC

## 2019-07-01 MED ORDER — CIPROFLOXACIN HCL 500 MG PO TABS: ORAL_TABLET | ORAL | 0 refills | Status: DC

## 2019-07-01 MED ORDER — TRIAMCINOLONE ACETONIDE 40 MG/ML IJ SUSP
40.0000 mg | Freq: Once | INTRAMUSCULAR | Status: AC
  Administered 2019-07-01: 40 mg via INTRAMUSCULAR

## 2019-07-01 MED ORDER — TRANDOLAPRIL 2 MG PO TABS: 2.0000 mg | ORAL_TABLET | Freq: Every day | ORAL | 1 refills | Status: DC

## 2019-07-01 MED ORDER — NYSTATIN 100000 UNIT/GM EX POWD: Freq: Four times a day (QID) | CUTANEOUS | 0 refills | Status: DC

## 2019-07-01 MED ORDER — VITAMIN D (ERGOCALCIFEROL) 1.25 MG (50000 UNIT) PO CAPS: ORAL_CAPSULE | ORAL | 0 refills | Status: DC

## 2019-07-01 MED ORDER — KETOROLAC TROMETHAMINE 30 MG/ML IJ SOLN
30.0000 mg | Freq: Once | INTRAMUSCULAR | Status: AC
  Administered 2019-07-01: 30 mg via INTRAMUSCULAR

## 2019-07-01 NOTE — Progress Notes (Signed)
Subjective:     Patient ID: Peggy [name] , female    DOB: 1945-09-24 , 73 y.o.   MRN: [id number]   Chief Complaint  Patient presents with  . Hypertension   The patient states she uses status post hysterectomy for birth control. Last LMP was No LMP recorded. Patient has had a hysterectomy.. Negative for Dysmenorrhea and Negative for Menorrhagia Mammogram last done 03/13/2018.  Negative for: breast discharge, breast lump(s), breast pain and breast self exam.  Pertinent negatives include abnormal bleeding (hematology), anxiety, decreased libido, depression, difficulty falling sleep, dyspareunia, history of infertility, nocturia, sexual dysfunction, sleep disturbances, urinary incontinence, urinary urgency, vaginal discharge and vaginal itching. Diet regular. The patient states her exercise level is  doing the bicycle in place as tolerated    The patient's tobacco use is:  Social History   Tobacco Use  Smoking Status Never Smoker  Smokeless Tobacco Never Used  . She has been exposed to passive smoke. The patient's alcohol use is:  Social History   Substance and Sexual Activity  Alcohol Use No    HPI  She is having "swelling to her lower extremities".  She has been having more issues with her left knee where she feels is giving way. She went to orthopedic but did not get seen due to the wait.    Hypertension This is a chronic problem. The current episode started more than 1 year ago. The problem is unchanged. The problem is uncontrolled. Pertinent negatives include no anxiety, blurred vision, chest pain, headaches, malaise/fatigue or palpitations. The current treatment provides no improvement. There are no compliance problems.  There is no history of angina. There is no history of chronic renal disease.  Knee Pain  The incident occurred more than 1 week ago. The pain is present in the left knee. The quality of the pain is described as aching. The pain is moderate. Associated symptoms  include an inability to bear weight. Nothing aggravates the symptoms. The treatment provided no relief.     Past Medical History:  Diagnosis Date  . Anxiety   . Arthritis    "all over"  . Asthma   . Bladder cancer ([place])   . Chronic bronchitis ([place])    'get it q yr"  . Chronic lower back pain   . Daily headache   . Deafness in left ear   . Depression   . DVT (deep venous thrombosis) ([place]) 2009/2010   "probably left leg"   . GERD (gastroesophageal reflux disease)   . History of cardiac catheterization 2005, 2012   Both catheterizations for "anginal symptoms "showed angiographically normal coronary arteries. -->  Negative Myoview June 2016.  Coronary calcification noted on chest CT  . History of kidney stones   . Hypercholesteremia   . Hypertension   . Nummular eczema   . Skin cancer    "right abdomen"     Family History  Problem Relation Age of Onset  . Heart failure Mother   . Cancer Mother   . Hypertension Mother   . Hypertension Father   . Heart attack Father        Noted on cardiology intake form as "massive heart attack" at age 42  . Heart failure Sister   . Heart attack Sister        Not listed on her cardiology intake form  . Breast cancer Sister   . Heart failure Sister   . Heart attack Sister        Not  listed on her cardiology intake  . Cancer Sister   . Breast cancer Sister      Current Outpatient Medications:  .  acetaminophen (TYLENOL) 500 MG tablet, Take 1,000 mg by mouth every 8 (eight) hours as needed for moderate pain or headache. , Disp: , Rfl:  .  diphenhydrAMINE (ALLERGY RELIEF) 25 mg capsule, Take 25 mg by mouth daily., Disp: , Rfl:  .  FLUoxetine (PROZAC) 40 MG capsule, Take 40 mg by mouth daily as needed (depression)., Disp: , Rfl:  .  furosemide (LASIX) 20 MG tablet, Take 1 tablet (20 mg total) by mouth as needed. For shortness of breathe or edema( swelling), Disp: 15 tablet, Rfl: 11 .  simvastatin (ZOCOR) 80 MG tablet, Take 1 tablet (80 mg  total) by mouth at bedtime., Disp: 90 tablet, Rfl: 1 .  trandolapril (MAVIK) 2 MG tablet, Take 1 tablet (2 mg total) by mouth daily., Disp: 90 tablet, Rfl: 1 .  valACYclovir (VALTREX) 1000 MG tablet, Take 0.5 tablets (500 mg total) by mouth 3 (three) times daily as needed (for outbreak)., Disp: 90 tablet, Rfl: 3 .  diclofenac sodium (VOLTAREN) 1 % GEL, Apply 2 g topically 4 (four) times daily. (Patient not taking: Reported on 03/31/2019), Disp: 100 g, Rfl: 2 .  nystatin (NYSTATIN) powder, Apply topically 4 (four) times daily. (Patient not taking: Reported on 07/01/2019), Disp: 15 g, Rfl: 0 .  Vitamin D, Ergocalciferol, (DRISDOL) 1.25 MG (50000 UT) CAPS capsule, Take 1 capsule twice a week (Patient not taking: Reported on 07/01/2019), Disp: 8 capsule, Rfl: 0   Allergies  Allergen Reactions  . Penicillins Shortness Of Breath, Itching, Swelling, Rash and Other (See Comments)    Has patient had a PCN reaction causing immediate rash, facial/tongue/throat swelling, SOB or lightheadedness with hypotension: Yes Has patient had a PCN reaction causing severe rash involving mucus membranes or skin necrosis: No Has patient had a PCN reaction that required hospitalization: Yes Has patient had a PCN reaction occurring within the last 10 years: No If all of the above answers are "NO", then may proceed with Cephalosporin use.   . Azithromycin Itching, Swelling and Rash  . Levaquin [Levofloxacin In D5w] Hives, Itching, Swelling and Rash  . Clindamycin/Lincomycin Hives  . Erythromycin Base Itching  . Keflex [Cephalexin] Itching and Rash  . Sulfa Antibiotics Itching and Rash  . Tetracyclines & Related Itching and Rash     Review of Systems  Constitutional: Negative.  Negative for malaise/fatigue.  Eyes: Negative.  Negative for blurred vision.  Respiratory: Negative.   Cardiovascular: Negative.  Negative for chest pain, palpitations and leg swelling.  Endocrine: Negative.   Genitourinary: Negative.    Musculoskeletal: Positive for gait problem and joint swelling. Negative for arthralgias and myalgias.  Skin: Negative.   Neurological: Negative for dizziness and headaches.  Hematological: Negative.   Psychiatric/Behavioral: Negative.      Today's Vitals   07/01/19 0937  BP: (!) 142/80  Pulse: 79  Temp: 98.6 F (37 C)  TempSrc: Oral  Weight: 211 lb 12.8 oz (96.1 kg)  Height: 5' 1.8" (1.57 m)  PainSc: 10-Worst pain ever  PainLoc: Knee   Body mass index is 38.99 kg/m.   Objective:  Physical Exam Vitals signs reviewed.  Constitutional:      Appearance: Normal appearance.  HENT:     Head: Normocephalic and atraumatic.  Neck:     Musculoskeletal: Normal range of motion and neck supple. No neck rigidity.  Cardiovascular:     Rate  and Rhythm: Normal rate and regular rhythm.     Pulses: Normal pulses.     Heart sounds: Normal heart sounds. No murmur.  Pulmonary:     Effort: Pulmonary effort is normal. No respiratory distress.     Breath sounds: Normal breath sounds.  Abdominal:     General: Abdomen is flat. Bowel sounds are normal.     Palpations: Abdomen is soft.  Musculoskeletal:        General: Swelling, tenderness (bilateral knees ) and deformity present.     Right lower leg: Edema present.     Left lower leg: Edema present.  Skin:    General: Skin is warm and dry.     Capillary Refill: Capillary refill takes less than 2 seconds.  Neurological:     General: No focal deficit present.     Mental Status: She is alert and oriented to person, place, and time.  Psychiatric:        Mood and Affect: Mood normal.        Behavior: Behavior normal.        Thought Content: Thought content normal.        Judgment: Judgment normal.         Assessment And Plan:    1. Essential hypertension . B/P is controlled.  . CMP ordered to check renal function.  . The importance of regular exercise and dietary modification was stressed to the patient.  - CMP14+EGFR -  trandolapril (MAVIK) 2 MG tablet; Take 1 tablet (2 mg total) by mouth  daily.  Dispense: 90 tablet; Refill: 1  2. Mixed hyperlipidemia  Chronic, controlled  Continue with current medications - CMP14+EGFR - Lipid panel - simvastatin (ZOCOR) 80 MG tablet; Take 1 tablet (80 mg total) by mouth at bedtime.  Dispense: 90 tablet; Refill: 1  3. Need for influenza vaccination  Influenza vaccine given in office  Advised to take Tylenol as needed for muscle aches or fever - Flu vaccine HIGH DOSE PF (Fluzone High dose)  4. Risk for falls  Will refer her to PT to help with safety - Ambulatory referral to Physical Therapy  5. Chronic pain of left knee  Left knee continues to have pain and swelling she went to the orthopedic but was not seen due to the long wait  I have advised her she really needs to see an orthopedic for long term management  Administered toradol and kenalog today in office - Ambulatory referral to Physical Therapy - triamcinolone acetonide (KENALOG-40) injection 40 mg - ketorolac (TORADOL) 30 MG/ML injection 30 mg  6. Vitamin D deficiency  Will check vitamin D level and supplement as needed.     Also encouraged to spend 15 minutes in the sun daily.  - VITAMIN D 25 Hydroxy (Vit-D Deficiency, Fractures)  7. Encounter for hepatitis C screening test for low risk patient  Will check for Hepatitis C screening due to being born between the years [birth year] - Hepatitis C antibody    [name], FNP    THE PATIENT IS ENCOURAGED TO PRACTICE SOCIAL DISTANCING DUE TO THE COVID-19 PANDEMIC.

## 2019-07-02 LAB — CMP14+EGFR
ALT: 11 IU/L (ref 0–32)
AST: 16 IU/L (ref 0–40)
Albumin/Globulin Ratio: 1.7 (ref 1.2–2.2)
Albumin: 4.7 g/dL (ref 3.7–4.7)
Alkaline Phosphatase: 137 IU/L — ABNORMAL HIGH (ref 39–117)
BUN/Creatinine Ratio: 14 (ref 12–28)
BUN: 14 mg/dL (ref 8–27)
Bilirubin Total: 0.4 mg/dL (ref 0.0–1.2)
CO2: 24 mmol/L (ref 20–29)
Calcium: 9.5 mg/dL (ref 8.7–10.3)
Chloride: 105 mmol/L (ref 96–106)
Creatinine, Ser: 0.99 mg/dL (ref 0.57–1.00)
GFR calc Af Amer: 64 mL/min/{1.73_m2} (ref >59)
GFR calc non Af Amer: 56 mL/min/{1.73_m2} — ABNORMAL LOW (ref >59)
Globulin, Total: 2.7 g/dL (ref 1.5–4.5)
Glucose: 87 mg/dL (ref 65–99)
Potassium: 4.8 mmol/L (ref 3.5–5.2)
Sodium: 142 mmol/L (ref 134–144)
Total Protein: 7.4 g/dL (ref 6.0–8.5)

## 2019-07-02 LAB — VITAMIN D 25 HYDROXY (VIT D DEFICIENCY, FRACTURES): Vit D, 25-Hydroxy: 25.2 ng/mL — ABNORMAL LOW (ref 30.0–100.0)

## 2019-07-02 LAB — LIPID PANEL
Chol/HDL Ratio: 2.9 ratio (ref 0.0–4.4)
Cholesterol, Total: 165 mg/dL (ref 100–199)
HDL: 56 mg/dL (ref >39)
LDL Chol Calc (NIH): 92 mg/dL (ref 0–99)
Triglycerides: 89 mg/dL (ref 0–149)
VLDL Cholesterol Cal: 17 mg/dL (ref 5–40)

## 2019-07-02 LAB — HEPATITIS C ANTIBODY: Hep C Virus Ab: 0.1 s/co ratio (ref 0.0–0.9)

## 2019-07-03 ENCOUNTER — Telehealth: Payer: Self-pay

## 2019-07-03 NOTE — Telephone Encounter (Signed)
1st attempt to give lab results  

## 2019-07-03 NOTE — Telephone Encounter (Signed)
-----   Message from [name], [place] sent at 07/02/2019  6:07 PM EDT ----- Kidney functions are stable, continue to stay well hydrated with water.  Cholesterol levels are normal.  Vitamin d is 25.2 up from 17.8 take an over the counter vitamin d 5,000 units once a day or you can pay for the Rx vitamin d 50,000 units once a week, your insurance did not cover this but it may not be too expensive.  Hepatitis c is negative.

## 2019-07-03 NOTE — Telephone Encounter (Signed)
Patient was notified of lab results Kidney functions are stable, continue to stay well hydrated with water.  Cholesterol levels are normal.  Vitamin d is 25.2 up from 17.8 take an over the counter vitamin d 5,000 units once a day or you can pay for the Rx vitamin d 50,000 units once a week, your insurance did not cover this but it may not be too expensive.  Hepatitis c is negative.

## 2019-07-15 ENCOUNTER — Other Ambulatory Visit: Payer: Self-pay

## 2019-07-15 ENCOUNTER — Ambulatory Visit: Payer: Medicare HMO | Attending: Nurse Practitioner

## 2019-07-15 DIAGNOSIS — M25562 Pain in left knee: Secondary | ICD-10-CM | POA: Diagnosis not present

## 2019-07-15 DIAGNOSIS — R600 Localized edema: Secondary | ICD-10-CM

## 2019-07-15 DIAGNOSIS — R262 Difficulty in walking, not elsewhere classified: Secondary | ICD-10-CM

## 2019-07-15 DIAGNOSIS — G8929 Other chronic pain: Secondary | ICD-10-CM | POA: Diagnosis not present

## 2019-07-15 NOTE — Therapy (Signed)
[facility], [place], [address] Phone: [phone]   Fax:  [phone]  Physical Therapy Evaluation/Discharge  Patient Details  Name: Peggy [name] MRN: [id number] Date of Birth: 1945-10-24 Referring Provider (PT): [name]  NP   Encounter Date: 07/15/2019  PT End of Session - 07/15/19 1002    Visit Number  1    Number of Visits  1    PT Start Time  0915    PT Stop Time  1000    PT Time Calculation (min)  45 min    Activity Tolerance  Patient limited by pain    Behavior During Therapy  [facility] for tasks assessed/performed       Past Medical History:  Diagnosis Date  . Anxiety   . Arthritis    "all over"  . Asthma   . Bladder cancer ([place])   . Chronic bronchitis ([place])    'get it q yr"  . Chronic lower back pain   . Daily headache   . Deafness in left ear   . Depression   . DVT (deep venous thrombosis) ([place]) 2009/2010   "probably left leg"   . GERD (gastroesophageal reflux disease)   . History of cardiac catheterization 2005, 2012   Both catheterizations for "anginal symptoms "showed angiographically normal coronary arteries. -->  Negative Myoview June 2016.  Coronary calcification noted on chest CT  . History of kidney stones   . Hypercholesteremia   . Hypertension   . Nummular eczema   . Skin cancer    "right abdomen"    Past Surgical History:  Procedure Laterality Date  . ABDOMINAL HYSTERECTOMY  1980's  . APPENDECTOMY Right   . BACK SURGERY     x 2, lower  . BLADDER TUMOR EXCISION    . BREAST BIOPSY Right   . BREAST EXCISIONAL BIOPSY Right   . CAROTID ARTERY DOPPLERS   10/2017   Carotid Dopplers: 40-59% stenosis on the Right Internal Carotid, < 40% stenosis in the Left. Both vertebral arteries show normal flow. Left subclavian artery is normal, but there is some flow disturbance in the right subclavian artery.  . COLONOSCOPY WITH PROPOFOL N/A 02/26/2015   Procedure: COLONOSCOPY  WITH PROPOFOL;  Surgeon: Carol Ada, MD;  Location: [facility];  Service: Endoscopy;  Laterality: N/A;  . COLONOSCOPY WITH PROPOFOL N/A 12/06/2017   Procedure: COLONOSCOPY WITH PROPOFOL;  Surgeon: Juanita Craver, MD;  Location: [facility];  Service: Endoscopy;  Laterality: N/A;  . CORONARY CALCIUM SCORE & CT ANGIOGRAM  11/2017   Coronary calcium score 131 (moderate risk).  Moderate proximal LAD & LCx stenosis --> LOW RISK STUDY.  No evidence to suspect cardiac related chest pain --> correlates with cardiac catheterizations  . ENTEROSCOPY N/A 12/14/2017   Procedure: ENTEROSCOPY;  Surgeon: Carol Ada, MD;  Location: [facility];  Service: Endoscopy;  Laterality: N/A;  . ESOPHAGOGASTRODUODENOSCOPY Left 02/20/2015   Procedure: ESOPHAGOGASTRODUODENOSCOPY (EGD);  Surgeon: Milus Banister, MD;  Location: [place];  Service: Endoscopy;  Laterality: Left;  . ESOPHAGOGASTRODUODENOSCOPY (EGD) WITH PROPOFOL N/A 12/06/2017   Procedure: ESOPHAGOGASTRODUODENOSCOPY (EGD) WITH PROPOFOL;  Surgeon: Juanita Craver, MD;  Location: [facility];  Service: Endoscopy;  Laterality: N/A;  . HOT HEMOSTASIS N/A 12/14/2017   Procedure: HOT HEMOSTASIS (ARGON PLASMA COAGULATION/BICAP);  Surgeon: Carol Ada, MD;  Location: [facility];  Service: Endoscopy;  Laterality: N/A;  . JOINT REPLACEMENT    . KNEE ARTHROSCOPY Right ~ 2000  . LEFT HEART CATH AND CORONARY  ANGIOGRAPHY  11/'05; 2/'12   Angiographically normal coronary arteries  . LUMBAR DISC SURGERY  1980's X 1; ~ 2002  . MEDIAL PARTIAL KNEE REPLACEMENT Right 2001  . NM MYOVIEW LTD  02/2015   EF 65%.  No reversible ischemia.  Possible scar along the cardiac apex and anteroseptal wall.  (Mild severity, medium-sized defect in the apex anteroseptal wall noted on both resting and stress images).  No inducible ischemia.  Read as LOW RISK  . PARTIAL KNEE ARTHROPLASTY Left 2009  . REPLACEMENT TOTAL KNEE BILATERAL Bilateral   . REVISION TOTAL KNEE ARTHROPLASTY Left   .  SKIN CANCER EXCISION Right    "abdomen"  . TRANSTHORACIC ECHOCARDIOGRAM  06/'16; 2/'19    A) mod Conc LVH. EF 60-65%.  No RWMA. GR 1 D. Mild Ao Stenosis. Mild LA dilation.;  B) EF 60-65%.  Mild LVH.  No or W MA.  No significant valve disease.  Aortic stenosis not noted    There were no vitals filed for this visit.   Subjective Assessment - 07/15/19 0931    Subjective  She has had knee issued on the Left since TKA 11 years ago.  She began to have more instability a month ago.  she began to have more swelling too.    She has been walking with [employer] and rolling walker.   She uses walker out of home some and cane every day.    Limitations  Walking   Does not do much house work.   Currently in Pain?  Yes    Pain Score  9     Pain Location  Knee    Pain Orientation  Left;Anterior;Lateral;Medial    Pain Descriptors / Indicators  Aching   hurting, numb in lower leg/foot   Pain Type  Chronic pain    Pain Onset  More than a month ago    Pain Frequency  Constant    Aggravating Factors   Nothing    Pain Relieving Factors  nothing,         [facility] PT Assessment - 07/15/19 0001      Assessment   Medical Diagnosis  LT knee pain    Referring Provider (PT)  [name]  NP    Onset Date/Surgical Date  --   11 years ago,, more in past month   Next MD Visit  !10/2018    Prior Therapy  No      Precautions   Precautions  Fall      Restrictions   Weight Bearing Restrictions  No      Balance Screen   Has the patient fallen in the past 6 months  No      [place]  Level entry    [place]  One level      Prior Function   Level of Independence  Needs assistance with homemaking;Needs assistance with ADLs;Requires assistive device for independence      Cognition   Overall Cognitive Status  Within Functional Limits for tasks assessed      Observation/Other Assessments-Edema    Edema   Circumferential      Circumferential Edema   Circumferential - Right  --   LT 48 cm.      ROM / Strength   AROM / PROM / Strength  AROM;PROM;Strength      AROM   AROM Assessment Site  Knee  Right/Left Knee  Right;Left    Right Knee Extension  0    Right Knee Flexion  110    Left Knee Extension  -25    Left Knee Flexion  74      Strength   Strength Assessment Site  Hip;Knee    Right/Left Hip  Right;Left    Right Hip Flexion  4+/5    Right Hip External Rotation   4+/5    Right Hip Internal Rotation  4+/5    Left Hip Flexion  4-/5    Right/Left Knee  Right;Left    Right Knee Flexion  4+/5    Right Knee Extension  4+/5    Left Knee Flexion  4/5    Left Knee Extension  4/5                Objective measurements completed on examination: See above findings.                           Plan - 07/15/19 1002    Clinical Impression Statement  Ma Copps is limited by pain and stiffness of TL knee limiting her mobility and ability to do normal home  and community tasks. She did not want to do any activity that was at all painful. She is stiff in her LT knee with swelling and pain with all ROM and MMT.  We discussed need to allow the Ortho Sx to assess if prossthesis is in place and she decided she wanted to do that as she was canceled in last winter probably due to covid.  She has the phone number to call and make appointment.  She opted fpor discharge  until after she sees orhtopedics.    Personal Factors and Comorbidities  Age;Fitness;Past/Current Experience;Time since onset of injury/illness/exacerbation    Examination-Activity Limitations  Bathing;Locomotion Level;Bed Mobility;Bend;Carry;Sleep;Squat    Examination-Participation Restrictions  Estate agent;Shop    Clinical Decision Making  Low    PT Next Visit Plan  Pt opted to see the orthopedic surgeon to assess integrety of TKA and may return to PT if ordered.    Consulted and  Agree with Plan of Care  Patient       Patient will benefit from skilled therapeutic intervention in order to improve the following deficits and impairments:  Pain, Decreased activity tolerance, Decreased strength, Decreased range of motion  Visit Diagnosis: Chronic pain of left knee  Localized edema  Difficulty in walking, not elsewhere classified     Problem List Patient Active Problem List   Diagnosis Date Noted  . Chronic pain of left knee 12/11/2018  . Leg swelling 12/12/2017  . Chest pain with low risk for cardiac etiology 10/11/2017  . Systolic ejection murmur 99991111  . Bilateral carotid bruits 10/11/2017  . Coronary artery calcification seen on CAT scan 10/11/2017  . Anemia, iron deficiency 02/20/2015  . Musculoskeletal chest pain 02/20/2015  . Chest pain at rest 02/18/2015  . Mixed hyperlipidemia 02/18/2015  . Sciatica 12/10/2012  . Back pain, lumbosacral 12/10/2012  . Essential hypertension 12/10/2012  . Spinal stenosis 12/10/2012  . DDD (degenerative disc disease), lumbar 12/10/2012    Darrel Hoover PT 07/15/2019, 10:09 AM  [facility] [address] [place], [place], [address] Phone: [phone]   Fax:  [phone]  Name: WINTA SPENGLER MRN: [id number] Date of Birth: May 12, 1946

## 2019-08-13 ENCOUNTER — Encounter: Payer: Self-pay | Admitting: Nurse Practitioner

## 2019-08-13 ENCOUNTER — Ambulatory Visit
Admission: RE | Admit: 2019-08-13 | Discharge: 2019-08-13 | Disposition: A | Discharge status: Home / Self Care | Payer: Medicare HMO | Source: Ambulatory Visit | Attending: Nurse Practitioner | Admitting: Nurse Practitioner

## 2019-08-13 ENCOUNTER — Other Ambulatory Visit: Payer: Self-pay

## 2019-08-13 ENCOUNTER — Ambulatory Visit (INDEPENDENT_AMBULATORY_CARE_PROVIDER_SITE_OTHER): Payer: Medicare HMO

## 2019-08-13 ENCOUNTER — Ambulatory Visit (INDEPENDENT_AMBULATORY_CARE_PROVIDER_SITE_OTHER): Payer: Medicare HMO | Admitting: Nurse Practitioner

## 2019-08-13 ENCOUNTER — Ambulatory Visit: Payer: Medicare HMO

## 2019-08-13 VITALS — BP 138/82 | HR 98 | Temp 98.7°F | Ht 62.0 in | Wt 208.8 lb

## 2019-08-13 VITALS — BP 138/82 | HR 98 | Temp 98.7°F | Ht 62.0 in | Wt 208.0 lb

## 2019-08-13 DIAGNOSIS — R350 Frequency of micturition: Secondary | ICD-10-CM | POA: Diagnosis not present

## 2019-08-13 DIAGNOSIS — M5442 Lumbago with sciatica, left side: Secondary | ICD-10-CM

## 2019-08-13 DIAGNOSIS — M545 Low back pain: Secondary | ICD-10-CM | POA: Diagnosis not present

## 2019-08-13 DIAGNOSIS — Z0000 Encounter for general adult medical examination without abnormal findings: Secondary | ICD-10-CM | POA: Diagnosis not present

## 2019-08-13 LAB — POCT URINALYSIS DIPSTICK
Bilirubin, UA: NEGATIVE
Blood, UA: NEGATIVE
Glucose, UA: NEGATIVE
Ketones, UA: NEGATIVE
Nitrite, UA: NEGATIVE
Protein, UA: NEGATIVE
Spec Grav, UA: 1.015 (ref 1.010–1.025)
Urobilinogen, UA: 0.2 E.U./dL
pH, UA: 7 (ref 5.0–8.0)

## 2019-08-13 MED ORDER — KETOROLAC TROMETHAMINE 60 MG/2ML IM SOLN
60.0000 mg | Freq: Once | INTRAMUSCULAR | Status: AC
  Administered 2019-08-13: 60 mg via INTRAMUSCULAR

## 2019-08-13 NOTE — Progress Notes (Signed)
This visit occurred during the SARS-CoV-2 public health emergency.  Safety protocols were in place, including screening questions prior to the visit, additional usage of staff PPE, and extensive cleaning of exam room while observing appropriate contact time as indicated for disinfecting solutions.  Subjective:     Patient ID: Peggy [name] , female    DOB: 1946/08/21 , 73 y.o.   MRN: [id number]   Chief Complaint  Patient presents with  . Back Pain    patient is having some pain her kidney area that is shooting down to her leg     HPI  Sharp pain  Back Pain This is a new problem. The current episode started in the past 7 days (4 days ago). The problem occurs constantly. The problem has been gradually worsening since onset. The pain is present in the lumbar spine. Radiates to: left thigh. The pain is at a severity of 8/10. The pain is severe. The pain is the same all the time. The symptoms are aggravated by sitting, lying down, standing and position. Pertinent negatives include no abdominal pain, chest pain, headaches, numbness or paresthesias. She has tried analgesics for the symptoms. The treatment provided no relief.     Past Medical History:  Diagnosis Date  . Anxiety   . Arthritis    "all over"  . Asthma   . Bladder cancer ([place])   . Chronic bronchitis ([place])    'get it q yr"  . Chronic lower back pain   . Daily headache   . Deafness in left ear   . Depression   . DVT (deep venous thrombosis) ([place]) 2009/2010   "probably left leg"   . GERD (gastroesophageal reflux disease)   . History of cardiac catheterization 2005, 2012   Both catheterizations for "anginal symptoms "showed angiographically normal coronary arteries. -->  Negative Myoview June 2016.  Coronary calcification noted on chest CT  . History of kidney stones   . Hypercholesteremia   . Hypertension   . Nummular eczema   . Skin cancer    "right abdomen"     Family History  Problem Relation Age of Onset  .  Heart failure Mother   . Cancer Mother   . Hypertension Mother   . Hypertension Father   . Heart attack Father        Noted on cardiology intake form as "massive heart attack" at age 69  . Heart failure Sister   . Heart attack Sister        Not listed on her cardiology intake form  . Breast cancer Sister   . Heart failure Sister   . Heart attack Sister        Not listed on her cardiology intake  . Cancer Sister   . Breast cancer Sister      Current Outpatient Medications:  .  acetaminophen (TYLENOL) 500 MG tablet, Take 1,000 mg by mouth every 8 (eight) hours as needed for moderate pain or headache. , Disp: , Rfl:  .  ciprofloxacin (CIPRO) 500 MG tablet, Take 1 tablet by mouth twice a day (Patient not taking: Reported on 07/15/2019), Disp: 6 tablet, Rfl: 0 .  diclofenac sodium (VOLTAREN) 1 % GEL, Apply 2 g topically 4 (four) times daily. (Patient not taking: Reported on 03/31/2019), Disp: 100 g, Rfl: 2 .  diphenhydrAMINE (ALLERGY RELIEF) 25 mg capsule, Take 25 mg by mouth daily., Disp: , Rfl:  .  FLUoxetine (PROZAC) 40 MG capsule, Take 40 mg by mouth daily as  needed (depression)., Disp: , Rfl:  .  furosemide (LASIX) 20 MG tablet, Take 1 tablet (20 mg total) by mouth as needed. For shortness of breathe or edema( swelling), Disp: 15 tablet, Rfl: 11 .  nystatin (NYSTATIN) powder, Apply topically 4 (four) times daily., Disp: 15 g, Rfl: 0 .  simvastatin (ZOCOR) 80 MG tablet, Take 1 tablet (80 mg total) by mouth at bedtime., Disp: 90 tablet, Rfl: 1 .  trandolapril (MAVIK) 2 MG tablet, Take 1 tablet (2 mg total) by mouth daily., Disp: 90 tablet, Rfl: 1 .  valACYclovir (VALTREX) 1000 MG tablet, Take 0.5 tablets (500 mg total) by mouth 3 (three) times daily as needed (for outbreak)., Disp: 90 tablet, Rfl: 3 .  Vitamin D, Ergocalciferol, (DRISDOL) 1.25 MG (50000 UT) CAPS capsule, TAKE 1 CAPSULE BY MOUTH 2 TIMES A WEEK, Disp: 26 capsule, Rfl: 0   Allergies  Allergen Reactions  . Penicillins  Shortness Of Breath, Itching, Swelling, Rash and Other (See Comments)    Has patient had a PCN reaction causing immediate rash, facial/tongue/throat swelling, SOB or lightheadedness with hypotension: Yes Has patient had a PCN reaction causing severe rash involving mucus membranes or skin necrosis: No Has patient had a PCN reaction that required hospitalization: Yes Has patient had a PCN reaction occurring within the last 10 years: No If all of the above answers are "NO", then may proceed with Cephalosporin use.   . Azithromycin Itching, Swelling and Rash  . Levaquin [Levofloxacin In D5w] Hives, Itching, Swelling and Rash  . Clindamycin/Lincomycin Hives  . Erythromycin Base Itching  . Keflex [Cephalexin] Itching and Rash  . Sulfa Antibiotics Itching and Rash  . Tetracyclines & Related Itching and Rash     Review of Systems  Constitutional: Negative.   Respiratory: Negative.   Cardiovascular: Negative.  Negative for chest pain, palpitations and leg swelling.  Gastrointestinal: Negative for abdominal pain.  Musculoskeletal: Positive for back pain. Negative for arthralgias.  Neurological: Negative for dizziness, numbness, headaches and paresthesias.  Psychiatric/Behavioral: Negative.      Today's Vitals   08/13/19 1111  BP: 138/82  Pulse: 98  Temp: 98.7 F (37.1 C)  TempSrc: Oral  Weight: 208 lb (94.3 kg)  Height: 5\' 2"  (1.575 m)   Body mass index is 38.04 kg/m.   Objective:  Physical Exam Constitutional:      General: She is not in acute distress.    Appearance: Normal appearance. She is obese.  Cardiovascular:     Rate and Rhythm: Normal rate and regular rhythm.     Pulses: Normal pulses.     Heart sounds: Normal heart sounds. No murmur.  Pulmonary:     Effort: Pulmonary effort is normal. No respiratory distress.     Breath sounds: Normal breath sounds.  Musculoskeletal:        General: Swelling (bilateral knees chronic) and tenderness (left low back and left hip,  muscle area) present.  Skin:    Capillary Refill: Capillary refill takes less than 2 seconds.  Neurological:     General: No focal deficit present.     Mental Status: She is alert and oriented to person, place, and time.         Assessment And Plan:     1. Acute bilateral low back pain with left-sided sciatica  toradol IM given in office  I will check another xray to see if there are any changes since she is having severe pain and is requesting - DG Lumbar Spine Complete; Future -  ketorolac (TORADOL) injection 60 mg  2. Urinary frequency  Urinalysis is normal  With the back pain wanted to make sure not related to a kidney infection.   Encouraged to continue to stay well hydrated with water.    [name], FNP    THE PATIENT IS ENCOURAGED TO PRACTICE SOCIAL DISTANCING DUE TO THE COVID-19 PANDEMIC.

## 2019-08-13 NOTE — Patient Instructions (Signed)
Acute Back Pain, Adult Acute back pain is sudden and usually short-lived. It is often caused by an injury to the muscles and tissues in the back. The injury may result from:  A muscle or ligament getting overstretched or torn (strained). Ligaments are tissues that connect bones to each other. Lifting something improperly can cause a back strain.  Wear and tear (degeneration) of the spinal disks. Spinal disks are circular tissue that provides cushioning between the bones of the spine (vertebrae).  Twisting motions, such as while playing sports or doing yard work.  A hit to the back.  Arthritis. You may have a physical exam, lab tests, and imaging tests to find the cause of your pain. Acute back pain usually goes away with rest and home care. Follow these instructions at home: Managing pain, stiffness, and swelling  Take over-the-counter and prescription medicines only as told by your health care provider.  Your health care provider may recommend applying ice during the first 24-48 hours after your pain starts. To do this: ? Put ice in a plastic bag. ? Place a towel between your skin and the bag. ? Leave the ice on for 20 minutes, 2-3 times a day.  If directed, apply heat to the affected area as often as told by your health care provider. Use the heat source that your health care provider recommends, such as a moist heat pack or a heating pad. ? Place a towel between your skin and the heat source. ? Leave the heat on for 20-30 minutes. ? Remove the heat if your skin turns bright red. This is especially important if you are unable to feel pain, heat, or cold. You have a greater risk of getting burned. Activity   Do not stay in bed. Staying in bed for more than 1-2 days can delay your recovery.  Sit up and stand up straight. Avoid leaning forward when you sit, or hunching over when you stand. ? If you work at a desk, sit close to it so you do not need to lean over. Keep your chin tucked  in. Keep your neck drawn back, and keep your elbows bent at a right angle. Your arms should look like the letter "L." ? Sit high and close to the steering wheel when you drive. Add lower back (lumbar) support to your car seat, if needed.  Take short walks on even surfaces as soon as you are able. Try to increase the length of time you walk each day.  Do not sit, drive, or stand in one place for more than 30 minutes at a time. Sitting or standing for long periods of time can put stress on your back.  Do not drive or use heavy machinery while taking prescription pain medicine.  Use proper lifting techniques. When you bend and lift, use positions that put less stress on your back: ? Bend your knees. ? Keep the load close to your body. ? Avoid twisting.  Exercise regularly as told by your health care provider. Exercising helps your back heal faster and helps prevent back injuries by keeping muscles strong and flexible.  Work with a physical therapist to make a safe exercise program, as recommended by your health care provider. Do any exercises as told by your physical therapist. Lifestyle  Maintain a healthy weight. Extra weight puts stress on your back and makes it difficult to have good posture.  Avoid activities or situations that make you feel anxious or stressed. Stress and anxiety increase muscle   tension and can make back pain worse. Learn ways to manage anxiety and stress, such as through exercise. General instructions  Sleep on a firm mattress in a comfortable position. Try lying on your side with your knees slightly bent. If you lie on your back, put a pillow under your knees.  Follow your treatment plan as told by your health care provider. This may include: ? Cognitive or behavioral therapy. ? Acupuncture or massage therapy. ? Meditation or yoga. Contact a health care provider if:  You have pain that is not relieved with rest or medicine.  You have increasing pain going down  into your legs or buttocks.  Your pain does not improve after 2 weeks.  You have pain at night.  You lose weight without trying.  You have a fever or chills. Get help right away if:  You develop new bowel or bladder control problems.  You have unusual weakness or numbness in your arms or legs.  You develop nausea or vomiting.  You develop abdominal pain.  You feel faint. Summary  Acute back pain is sudden and usually short-lived.  Use proper lifting techniques. When you bend and lift, use positions that put less stress on your back.  Take over-the-counter and prescription medicines and apply heat or ice as directed by your health care provider. This information is not intended to replace advice given to you by your health care provider. Make sure you discuss any questions you have with your health care provider. Document Released: 08/21/2005 Document Revised: 12/10/2018 Document Reviewed: 04/04/2017 Elsevier Patient Education  2020 [employer]  

## 2019-08-13 NOTE — Progress Notes (Signed)
This visit occurred during the SARS-CoV-2 public health emergency.  Safety protocols were in place, including screening questions prior to the visit, additional usage of staff PPE, and extensive cleaning of exam room while observing appropriate contact time as indicated for disinfecting solutions.  Subjective:   Peggy [name] is a 73 y.o. female who presents for Medicare Annual (Subsequent) preventive examination.  Review of Systems:  n/a Cardiac Risk Factors include: advanced age (>77men, >40 women);dyslipidemia;hypertension;obesity (BMI >30kg/m2);sedentary lifestyle     Objective:     Vitals: BP 138/82 (BP Location: Left Arm, Patient Position: Sitting, Cuff Size: Normal)   Pulse 98   Temp 98.7 F (37.1 C) (Oral)   Ht 5\' 2"  (1.575 m)   Wt 208 lb 12.8 oz (94.7 kg)   SpO2 99%   BMI 38.19 kg/m   Body mass index is 38.19 kg/m.  Advanced Directives 08/13/2019 07/15/2019 07/30/2018 12/14/2017 09/04/2017 12/25/2015 02/26/2015  Does Patient Have a Medical Advance Directive? Yes No No No No No No  Type of Advance Directive [place] in Chart? No - copy requested - - - - - -  Would patient like information on creating a medical advance directive? - No - Patient declined Yes (MAU/Ambulatory/Procedural Areas - Information given) No - Patient declined - - No - patient declined information    Tobacco Social History   Tobacco Use  Smoking Status Never Smoker  Smokeless Tobacco Never Used     Counseling given: Not Answered   Clinical Intake:  Pre-visit preparation completed: Yes  Pain : 0-10 Pain Score: 8  Pain Type: Acute pain Pain Location: Back Pain Orientation: Mid, Left Pain Radiating Towards: down left sideand leg Pain Descriptors / Indicators: Constant, Pressure, Sharp Pain Onset: In the past 7 days Pain Frequency: Constant Pain Relieving Factors: nothing  Pain Relieving Factors: nothing  Nutritional  Status: BMI > 30  Obese Nutritional Risks: Nausea/ vomitting/ diarrhea(little diarrhea over the weekend) Diabetes: No  How often do you need to have someone help you when you read instructions, pamphlets, or other written materials from your doctor or pharmacy?: 1 - Never What is the last grade level you completed in school?: 12th grade  Interpreter Needed?: No  Information entered by :: NAllen LPN  Past Medical History:  Diagnosis Date  . Anxiety   . Arthritis    "all over"  . Asthma   . Bladder cancer ([place])   . Chronic bronchitis ([place])    'get it q yr"  . Chronic lower back pain   . Daily headache   . Deafness in left ear   . Depression   . DVT (deep venous thrombosis) ([place]) 2009/2010   "probably left leg"   . GERD (gastroesophageal reflux disease)   . History of cardiac catheterization 2005, 2012   Both catheterizations for "anginal symptoms "showed angiographically normal coronary arteries. -->  Negative Myoview June 2016.  Coronary calcification noted on chest CT  . History of kidney stones   . Hypercholesteremia   . Hypertension   . Nummular eczema   . Skin cancer    "right abdomen"   Past Surgical History:  Procedure Laterality Date  . ABDOMINAL HYSTERECTOMY  1980's  . APPENDECTOMY Right   . BACK SURGERY     x 2, lower  . BLADDER TUMOR EXCISION    . BREAST BIOPSY Right   . BREAST EXCISIONAL BIOPSY Right   .  CAROTID ARTERY DOPPLERS   10/2017   Carotid Dopplers: 40-59% stenosis on the Right Internal Carotid, < 40% stenosis in the Left. Both vertebral arteries show normal flow. Left subclavian artery is normal, but there is some flow disturbance in the right subclavian artery.  . COLONOSCOPY WITH PROPOFOL N/A 02/26/2015   Procedure: COLONOSCOPY WITH PROPOFOL;  Surgeon: Carol Ada, MD;  Location: [facility];  Service: Endoscopy;  Laterality: N/A;  . COLONOSCOPY WITH PROPOFOL N/A 12/06/2017   Procedure: COLONOSCOPY WITH PROPOFOL;  Surgeon: Juanita Craver, MD;   Location: [facility];  Service: Endoscopy;  Laterality: N/A;  . CORONARY CALCIUM SCORE & CT ANGIOGRAM  11/2017   Coronary calcium score 131 (moderate risk).  Moderate proximal LAD & LCx stenosis --> LOW RISK STUDY.  No evidence to suspect cardiac related chest pain --> correlates with cardiac catheterizations  . ENTEROSCOPY N/A 12/14/2017   Procedure: ENTEROSCOPY;  Surgeon: Carol Ada, MD;  Location: [facility];  Service: Endoscopy;  Laterality: N/A;  . ESOPHAGOGASTRODUODENOSCOPY Left 02/20/2015   Procedure: ESOPHAGOGASTRODUODENOSCOPY (EGD);  Surgeon: Milus Banister, MD;  Location: [facility];  Service: Endoscopy;  Laterality: Left;  . ESOPHAGOGASTRODUODENOSCOPY (EGD) WITH PROPOFOL N/A 12/06/2017   Procedure: ESOPHAGOGASTRODUODENOSCOPY (EGD) WITH PROPOFOL;  Surgeon: Juanita Craver, MD;  Location: [facility];  Service: Endoscopy;  Laterality: N/A;  . HOT HEMOSTASIS N/A 12/14/2017   Procedure: HOT HEMOSTASIS (ARGON PLASMA COAGULATION/BICAP);  Surgeon: Carol Ada, MD;  Location: [facility];  Service: Endoscopy;  Laterality: N/A;  . JOINT REPLACEMENT    . KNEE ARTHROSCOPY Right ~ 2000  . LEFT HEART CATH AND CORONARY ANGIOGRAPHY  11/'05; 2/'12   Angiographically normal coronary arteries  . LUMBAR DISC SURGERY  1980's X 1; ~ 2002  . MEDIAL PARTIAL KNEE REPLACEMENT Right 2001  . NM MYOVIEW LTD  02/2015   EF 65%.  No reversible ischemia.  Possible scar along the cardiac apex and anteroseptal wall.  (Mild severity, medium-sized defect in the apex anteroseptal wall noted on both resting and stress images).  No inducible ischemia.  Read as LOW RISK  . PARTIAL KNEE ARTHROPLASTY Left 2009  . REPLACEMENT TOTAL KNEE BILATERAL Bilateral   . REVISION TOTAL KNEE ARTHROPLASTY Left   . SKIN CANCER EXCISION Right    "abdomen"  . TRANSTHORACIC ECHOCARDIOGRAM  06/'16; 2/'19    A) mod Conc LVH. EF 60-65%.  No RWMA. GR 1 D. Mild Ao Stenosis. Mild LA dilation.;  B) EF 60-65%.  Mild LVH.  No or W MA.  No  significant valve disease.  Aortic stenosis not noted   Family History  Problem Relation Age of Onset  . Heart failure Mother   . Cancer Mother   . Hypertension Mother   . Hypertension Father   . Heart attack Father        Noted on cardiology intake form as "massive heart attack" at age 68  . Heart failure Sister   . Heart attack Sister        Not listed on her cardiology intake form  . Breast cancer Sister   . Heart failure Sister   . Heart attack Sister        Not listed on her cardiology intake  . Cancer Sister   . Breast cancer Sister    Social History   Socioeconomic History  . Marital status: Single    Spouse name: Not on file  . Number of children: 1  . Years of education: Not on file  . Highest education level: Not  on file  Occupational History  . Occupation: Retired    Fish farm manager: RETIRED    CommentProgrammer, systems  . Financial resource strain: Not hard at all  . Food insecurity    Worry: Never true    Inability: Never true  . Transportation needs    Medical: No    Non-medical: No  Tobacco Use  . Smoking status: Never Smoker  . Smokeless tobacco: Never Used  Substance and Sexual Activity  . Alcohol use: No  . Drug use: No  . Sexual activity: Not Currently  Lifestyle  . Physical activity    Days per week: 0 days    Minutes per session: 0 min  . Stress: Only a little  Relationships  . Social Herbalist on phone: Never    Gets together: Never    Attends religious service: More than 4 times per year    Active member of club or organization: No    Attends meetings of clubs or organizations: Never    Relationship status: Never married  Other Topics Concern  . Not on file  Social History Narrative   Relocated from [place]   She is currently "'.  She lives with her son (who is autistic).  She herself is disabled, and not working.   She never smoked and never drank alcohol.   She enjoys walking 7 days a week.    Outpatient  Encounter Medications as of 08/13/2019  Medication Sig  . acetaminophen (TYLENOL) 500 MG tablet Take 1,000 mg by mouth every 8 (eight) hours as needed for moderate pain or headache.   . diphenhydrAMINE (ALLERGY RELIEF) 25 mg capsule Take 25 mg by mouth daily.  Marland Kitchen FLUoxetine (PROZAC) 40 MG capsule Take 40 mg by mouth daily as needed (depression).  . furosemide (LASIX) 20 MG tablet Take 1 tablet (20 mg total) by mouth as needed. For shortness of breathe or edema( swelling)  . nystatin (NYSTATIN) powder Apply topically 4 (four) times daily.  . simvastatin (ZOCOR) 80 MG tablet Take 1 tablet (80 mg total) by mouth at bedtime.  . trandolapril (MAVIK) 2 MG tablet Take 1 tablet (2 mg total) by mouth daily.  . valACYclovir (VALTREX) 1000 MG tablet Take 0.5 tablets (500 mg total) by mouth 3 (three) times daily as needed (for outbreak).  . Vitamin D, Ergocalciferol, (DRISDOL) 1.25 MG (50000 UT) CAPS capsule TAKE 1 CAPSULE BY MOUTH 2 TIMES A WEEK  . diclofenac sodium (VOLTAREN) 1 % GEL Apply 2 g topically 4 (four) times daily. (Patient not taking: Reported on 03/31/2019)  . [DISCONTINUED] ciprofloxacin (CIPRO) 500 MG tablet Take 1 tablet by mouth twice a day (Patient not taking: Reported on 07/15/2019)   No facility-administered encounter medications on file as of 08/13/2019.     Activities of Daily Living In your present state of health, do you have any difficulty performing the following activities: 08/13/2019  Hearing? Y  Comment deaf in left ear  Vision? N  Difficulty concentrating or making decisions? N  Walking or climbing stairs? N  Dressing or bathing? N  Doing errands, shopping? N  Preparing Food and eating ? N  Using the Toilet? N  In the past six months, have you accidently leaked urine? Y  Comment if laughs or sneezes  Do you have problems with loss of bowel control? N  Managing your Medications? N  Managing your Finances? N  Housekeeping or managing your Housekeeping? N  Some recent  data might be hidden    Patient Care Team: [name], FNP as PCP - General (General Practice)    Assessment:   This is a routine wellness examination for [employer].  Exercise Activities and Dietary recommendations Current Exercise Habits: The patient does not participate in regular exercise at present  Goals    . Patient Stated     08/13/2019, no goals       Fall Risk Fall Risk  08/13/2019 07/01/2019 03/31/2019 12/26/2018 12/11/2018  Falls in the past year? 0 0 0 1 0  Number falls in past yr: - - - 0 -  Injury with Fall? - - - 0 -  Risk for fall due to : Impaired balance/gait;Impaired mobility;Medication side effect - - - -  Follow up Falls evaluation completed;Education provided;Falls prevention discussed - - - -   Is the patient's home free of loose throw rugs in walkways, pet beds, electrical cords, etc?   yes      Grab bars in the bathroom? yes      Handrails on the stairs?   n/a      Adequate lighting?   yes  Timed Get Up and Go performed: n/a  Depression Screen PHQ 2/9 Scores 08/13/2019 07/01/2019 03/31/2019 12/26/2018  PHQ - 2 Score 0 0 3 0  PHQ- 9 Score 0 - 6 -     Cognitive Function     6CIT Screen 08/13/2019 07/30/2018  What Year? 0 points 0 points  What month? 0 points 0 points  What time? 0 points 0 points  Count back from 20 0 points 0 points  Months in reverse 0 points 0 points  Repeat phrase 0 points 0 points  Total Score 0 0    Immunization History  Administered Date(s) Administered  . Influenza, High Dose Seasonal PF 07/01/2019  . Influenza-Unspecified 05/13/2018  . Pneumococcal Conjugate-13 09/03/2018  . Tdap 11/16/2017  . Zoster Recombinat (Shingrix) 09/04/2016, 02/02/2017    Qualifies for Shingles Vaccine? yes  Screening Tests Health Maintenance  Topic Date Due  . TETANUS/TDAP  11/20/2027  . COLONOSCOPY  12/07/2027  . INFLUENZA VACCINE  Completed  . DEXA SCAN  Completed  . Hepatitis C Screening  Completed  . PNA vac Low Risk Adult   Completed    Cancer Screenings: Lung: Low Dose CT Chest recommended if Age 34-80 years, 30 pack-year currently smoking OR have quit w/in 15years. Patient does not qualify. Breast:  Up to date on Mammogram? Yes   Up to date of Bone Density/Dexa? Yes Colorectal: up to date  Additional Screenings: : Hepatitis C Screening: 07/01/2019     Plan:    No goals set at this time.   I have personally reviewed and noted the following in the patient's chart:   . Medical and social history . Use of alcohol, tobacco or illicit drugs  . Current medications and supplements . Functional ability and status . Nutritional status . Physical activity . Advanced directives . List of other physicians . Hospitalizations, surgeries, and ER visits in previous 12 months . Vitals . Screenings to include cognitive, depression, and falls . Referrals and appointments  In addition, I have reviewed and discussed with patient certain preventive protocols, quality metrics, and best practice recommendations. A written personalized care plan for preventive services as well as general preventive health recommendations were provided to patient.     SHAVONTA ZAMBONI, LPN  [id number]

## 2019-08-13 NOTE — Patient Instructions (Signed)
Peggy [name] , Thank you for taking time to come for your Medicare Wellness Visit. I appreciate your ongoing commitment to your health goals. Please review the following plan we discussed and let me know if I can assist you in the future.   Screening recommendations/referrals: Colonoscopy: 12/2017 Mammogram: 03/2018 Bone Density: 10/2018 Recommended yearly ophthalmology/optometry visit for glaucoma screening and checkup Recommended yearly dental visit for hygiene and checkup  Vaccinations: Influenza vaccine: 06/2019 Pneumococcal vaccine: 08/2018 Tdap vaccine: 11/2017 Shingles vaccine: 2018    Advanced directives: Please bring a copy of your POA (Power of [place]) and/or Living Will to your next appointment.    Conditions/risks identified: obesity  Next appointment: 03/31/2020 at 9:45   Preventive Care 65 Years and Older, Female Preventive care refers to lifestyle choices and visits with your health care provider that can promote health and wellness. What does preventive care include?  A yearly physical exam. This is also called an annual well check.  Dental exams once or twice a year.  Routine eye exams. Ask your health care provider how often you should have your eyes checked.  Personal lifestyle choices, including:  Daily care of your teeth and gums.  Regular physical activity.  Eating a healthy diet.  Avoiding tobacco and drug use.  Limiting alcohol use.  Practicing safe sex.  Taking low-dose aspirin every day.  Taking vitamin and mineral supplements as recommended by your health care provider. What happens during an annual well check? The services and screenings done by your health care provider during your annual well check will depend on your age, overall health, lifestyle risk factors, and family history of disease. Counseling  Your health care provider may ask you questions about your:  Alcohol use.  Tobacco use.  Drug use.  Emotional well-being.  Home  and relationship well-being.  Sexual activity.  Eating habits.  History of falls.  Memory and ability to understand (cognition).  Work and work Statistician.  Reproductive health. Screening  You may have the following tests or measurements:  Height, weight, and BMI.  Blood pressure.  Lipid and cholesterol levels. These may be checked every 5 years, or more frequently if you are over 13 years old.  Skin check.  Lung cancer screening. You may have this screening every year starting at age 8 if you have a 30-pack-year history of smoking and currently smoke or have quit within the past 15 years.  Fecal occult blood test (FOBT) of the stool. You may have this test every year starting at age 40.  Flexible sigmoidoscopy or colonoscopy. You may have a sigmoidoscopy every 5 years or a colonoscopy every 10 years starting at age 50.  Hepatitis C blood test.  Hepatitis B blood test.  Sexually transmitted disease (STD) testing.  Diabetes screening. This is done by checking your blood sugar (glucose) after you have not eaten for a while (fasting). You may have this done every 1-3 years.  Bone density scan. This is done to screen for osteoporosis. You may have this done starting at age 27.  Mammogram. This may be done every 1-2 years. Talk to your health care provider about how often you should have regular mammograms. Talk with your health care provider about your test results, treatment options, and if necessary, the need for more tests. Vaccines  Your health care provider may recommend certain vaccines, such as:  Influenza vaccine. This is recommended every year.  Tetanus, diphtheria, and acellular pertussis (Tdap, Td) vaccine. You may need a Td booster  every 10 years.  Zoster vaccine. You may need this after age 37.  Pneumococcal 13-valent conjugate (PCV13) vaccine. One dose is recommended after age 36.  Pneumococcal polysaccharide (PPSV23) vaccine. One dose is recommended  after age 53. Talk to your health care provider about which screenings and vaccines you need and how often you need them. This information is not intended to replace advice given to you by your health care provider. Make sure you discuss any questions you have with your health care provider. Document Released: 09/17/2015 Document Revised: 05/10/2016 Document Reviewed: 06/22/2015 Elsevier Interactive Patient Education  2017 [place] Prevention in the Home Falls can cause injuries. They can happen to people of all ages. There are many things you can do to make your home safe and to help prevent falls. What can I do on the outside of my home?  Regularly fix the edges of walkways and driveways and fix any cracks.  Remove anything that might make you trip as you walk through a door, such as a raised step or threshold.  Trim any bushes or trees on the path to your home.  Use bright outdoor lighting.  Clear any walking paths of anything that might make someone trip, such as rocks or tools.  Regularly check to see if handrails are loose or broken. Make sure that both sides of any steps have handrails.  Any raised decks and porches should have guardrails on the edges.  Have any leaves, snow, or ice cleared regularly.  Use sand or salt on walking paths during winter.  Clean up any spills in your garage right away. This includes oil or grease spills. What can I do in the bathroom?  Use night lights.  Install grab bars by the toilet and in the tub and shower. Do not use towel bars as grab bars.  Use non-skid mats or decals in the tub or shower.  If you need to sit down in the shower, use a plastic, non-slip stool.  Keep the floor dry. Clean up any water that spills on the floor as soon as it happens.  Remove soap buildup in the tub or shower regularly.  Attach bath mats securely with double-sided non-slip rug tape.  Do not have throw rugs and other things on the floor  that can make you trip. What can I do in the bedroom?  Use night lights.  Make sure that you have a light by your bed that is easy to reach.  Do not use any sheets or blankets that are too big for your bed. They should not hang down onto the floor.  Have a firm chair that has side arms. You can use this for support while you get dressed.  Do not have throw rugs and other things on the floor that can make you trip. What can I do in the kitchen?  Clean up any spills right away.  Avoid walking on wet floors.  Keep items that you use a lot in easy-to-reach places.  If you need to reach something above you, use a strong step stool that has a grab bar.  Keep electrical cords out of the way.  Do not use floor polish or wax that makes floors slippery. If you must use wax, use non-skid floor wax.  Do not have throw rugs and other things on the floor that can make you trip. What can I do with my stairs?  Do not leave any items on the stairs.  Make  sure that there are handrails on both sides of the stairs and use them. Fix handrails that are broken or loose. Make sure that handrails are as long as the stairways.  Check any carpeting to make sure that it is firmly attached to the stairs. Fix any carpet that is loose or worn.  Avoid having throw rugs at the top or bottom of the stairs. If you do have throw rugs, attach them to the floor with carpet tape.  Make sure that you have a light switch at the top of the stairs and the bottom of the stairs. If you do not have them, ask someone to add them for you. What else can I do to help prevent falls?  Wear shoes that:  Do not have high heels.  Have rubber bottoms.  Are comfortable and fit you well.  Are closed at the toe. Do not wear sandals.  If you use a stepladder:  Make sure that it is fully opened. Do not climb a closed stepladder.  Make sure that both sides of the stepladder are locked into place.  Ask someone to hold it  for you, if possible.  Clearly mark and make sure that you can see:  Any grab bars or handrails.  First and last steps.  Where the edge of each step is.  Use tools that help you move around (mobility aids) if they are needed. These include:  Canes.  Walkers.  Scooters.  Crutches.  Turn on the lights when you go into a dark area. Replace any light bulbs as soon as they burn out.  Set up your furniture so you have a clear path. Avoid moving your furniture around.  If any of your floors are uneven, fix them.  If there are any pets around you, be aware of where they are.  Review your medicines with your doctor. Some medicines can make you feel dizzy. This can increase your chance of falling. Ask your doctor what other things that you can do to help prevent falls. This information is not intended to replace advice given to you by your health care provider. Make sure you discuss any questions you have with your health care provider. Document Released: 06/17/2009 Document Revised: 01/27/2016 Document Reviewed: 09/25/2014 Elsevier Interactive Patient Education  2017 [employer].

## 2019-08-20 ENCOUNTER — Encounter: Payer: Self-pay | Admitting: Nurse Practitioner

## 2019-10-09 DIAGNOSIS — F419 Anxiety disorder, unspecified: Secondary | ICD-10-CM | POA: Diagnosis not present

## 2019-11-18 ENCOUNTER — Other Ambulatory Visit: Payer: Self-pay | Admitting: Nurse Practitioner

## 2019-11-18 DIAGNOSIS — I10 Essential (primary) hypertension: Secondary | ICD-10-CM

## 2019-11-24 ENCOUNTER — Encounter: Payer: Self-pay | Admitting: Nurse Practitioner

## 2019-11-24 ENCOUNTER — Other Ambulatory Visit: Payer: Self-pay | Admitting: Nurse Practitioner

## 2019-11-24 MED ORDER — CIPROFLOXACIN HCL 500 MG PO TABS: 500.0000 mg | ORAL_TABLET | Freq: Two times a day (BID) | ORAL | 0 refills | Status: DC

## 2019-11-24 MED ORDER — HYDROCORTISONE 1 % EX CREA: TOPICAL_CREAM | CUTANEOUS | 1 refills | Status: AC

## 2019-12-10 ENCOUNTER — Emergency Department (HOSPITAL_COMMUNITY)
Admission: EM | Admit: 2019-12-10 | Discharge: 2019-12-10 | Disposition: A | Discharge status: Home / Self Care | Payer: Medicare HMO | Attending: Emergency Medicine | Admitting: Emergency Medicine

## 2019-12-10 ENCOUNTER — Encounter (HOSPITAL_COMMUNITY): Payer: Self-pay | Admitting: Emergency Medicine

## 2019-12-10 ENCOUNTER — Other Ambulatory Visit: Payer: Self-pay

## 2019-12-10 DIAGNOSIS — Z8551 Personal history of malignant neoplasm of bladder: Secondary | ICD-10-CM | POA: Diagnosis not present

## 2019-12-10 DIAGNOSIS — Z85828 Personal history of other malignant neoplasm of skin: Secondary | ICD-10-CM | POA: Insufficient documentation

## 2019-12-10 DIAGNOSIS — K1379 Other lesions of oral mucosa: Secondary | ICD-10-CM | POA: Insufficient documentation

## 2019-12-10 DIAGNOSIS — Z96653 Presence of artificial knee joint, bilateral: Secondary | ICD-10-CM | POA: Diagnosis not present

## 2019-12-10 DIAGNOSIS — I10 Essential (primary) hypertension: Secondary | ICD-10-CM | POA: Diagnosis not present

## 2019-12-10 DIAGNOSIS — J45909 Unspecified asthma, uncomplicated: Secondary | ICD-10-CM | POA: Diagnosis not present

## 2019-12-10 MED ORDER — LIDOCAINE VISCOUS HCL 2 % MT SOLN
15.0000 mL | Freq: Once | OROMUCOSAL | Status: AC
  Administered 2019-12-10: 15 mL via OROMUCOSAL
  Filled 2019-12-10: qty 15

## 2019-12-10 NOTE — ED Provider Notes (Signed)
[facility] Emergency Department Provider Note MRN:  [id number]  Arrival date & time: 12/10/19     Chief Complaint   Mouth pain History of Present Illness   Peggy [name] is a 74 y.o. year-old female with a history of DVT, bladder cancer, hypertension presenting to the ED with chief complaint of mouth pain.  Patient had a number of maxillary teeth pulled 1 week ago.  She began experiencing some discomfort to the left side of her tongue, underneath her tongue, and the back of her throat last night.  No trouble breathing, no rash, no voice change, no new medications, no new soaps or detergents or exposures, no new toothpaste.  Pain is constant, worse with swallowing.  Review of Systems  A complete 10 system review of systems was obtained and all systems are negative except as noted in the HPI and PMH.   Patient's Health History    Past Medical History:  Diagnosis Date  . Anxiety   . Arthritis    "all over"  . Asthma   . Bladder cancer ([place])   . Chronic bronchitis ([place])    'get it q yr"  . Chronic lower back pain   . Daily headache   . Deafness in left ear   . Depression   . DVT (deep venous thrombosis) ([place]) 2009/2010   "probably left leg"   . GERD (gastroesophageal reflux disease)   . History of cardiac catheterization 2005, 2012   Both catheterizations for "anginal symptoms "showed angiographically normal coronary arteries. -->  Negative Myoview June 2016.  Coronary calcification noted on chest CT  . History of kidney stones   . Hypercholesteremia   . Hypertension   . Nummular eczema   . Skin cancer    "right abdomen"    Past Surgical History:  Procedure Laterality Date  . ABDOMINAL HYSTERECTOMY  1980's  . APPENDECTOMY Right   . BACK SURGERY     x 2, lower  . BLADDER TUMOR EXCISION    . BREAST BIOPSY Right   . BREAST EXCISIONAL BIOPSY Right   . CAROTID ARTERY DOPPLERS   10/2017   Carotid Dopplers: 40-59% stenosis on the Right Internal  Carotid, < 40% stenosis in the Left. Both vertebral arteries show normal flow. Left subclavian artery is normal, but there is some flow disturbance in the right subclavian artery.  . COLONOSCOPY WITH PROPOFOL N/A 02/26/2015   Procedure: COLONOSCOPY WITH PROPOFOL;  Surgeon: Carol Ada, MD;  Location: [facility];  Service: Endoscopy;  Laterality: N/A;  . COLONOSCOPY WITH PROPOFOL N/A 12/06/2017   Procedure: COLONOSCOPY WITH PROPOFOL;  Surgeon: Juanita Craver, MD;  Location: [facility];  Service: Endoscopy;  Laterality: N/A;  . CORONARY CALCIUM SCORE & CT ANGIOGRAM  11/2017   Coronary calcium score 131 (moderate risk).  Moderate proximal LAD & LCx stenosis --> LOW RISK STUDY.  No evidence to suspect cardiac related chest pain --> correlates with cardiac catheterizations  . ENTEROSCOPY N/A 12/14/2017   Procedure: ENTEROSCOPY;  Surgeon: Carol Ada, MD;  Location: [facility];  Service: Endoscopy;  Laterality: N/A;  . ESOPHAGOGASTRODUODENOSCOPY Left 02/20/2015   Procedure: ESOPHAGOGASTRODUODENOSCOPY (EGD);  Surgeon: Milus Banister, MD;  Location: [facility];  Service: Endoscopy;  Laterality: Left;  . ESOPHAGOGASTRODUODENOSCOPY (EGD) WITH PROPOFOL N/A 12/06/2017   Procedure: ESOPHAGOGASTRODUODENOSCOPY (EGD) WITH PROPOFOL;  Surgeon: Juanita Craver, MD;  Location: [facility];  Service: Endoscopy;  Laterality: N/A;  . HOT HEMOSTASIS N/A 12/14/2017   Procedure: HOT HEMOSTASIS (ARGON PLASMA COAGULATION/BICAP);  Surgeon: Carol Ada, MD;  Location: [facility];  Service: Endoscopy;  Laterality: N/A;  . JOINT REPLACEMENT    . KNEE ARTHROSCOPY Right ~ 2000  . LEFT HEART CATH AND CORONARY ANGIOGRAPHY  11/'05; 2/'12   Angiographically normal coronary arteries  . LUMBAR DISC SURGERY  1980's X 1; ~ 2002  . MEDIAL PARTIAL KNEE REPLACEMENT Right 2001  . NM MYOVIEW LTD  02/2015   EF 65%.  No reversible ischemia.  Possible scar along the cardiac apex and anteroseptal wall.  (Mild severity, medium-sized defect  in the apex anteroseptal wall noted on both resting and stress images).  No inducible ischemia.  Read as LOW RISK  . PARTIAL KNEE ARTHROPLASTY Left 2009  . REPLACEMENT TOTAL KNEE BILATERAL Bilateral   . REVISION TOTAL KNEE ARTHROPLASTY Left   . SKIN CANCER EXCISION Right    "abdomen"  . TRANSTHORACIC ECHOCARDIOGRAM  06/'16; 2/'19    A) mod Conc LVH. EF 60-65%.  No RWMA. GR 1 D. Mild Ao Stenosis. Mild LA dilation.;  B) EF 60-65%.  Mild LVH.  No or W MA.  No significant valve disease.  Aortic stenosis not noted    Family History  Problem Relation Age of Onset  . Heart failure Mother   . Cancer Mother   . Hypertension Mother   . Hypertension Father   . Heart attack Father        Noted on cardiology intake form as "massive heart attack" at age 46  . Heart failure Sister   . Heart attack Sister        Not listed on her cardiology intake form  . Breast cancer Sister   . Heart failure Sister   . Heart attack Sister        Not listed on her cardiology intake  . Cancer Sister   . Breast cancer Sister     Social History   Socioeconomic History  . Marital status: Single    Spouse name: Not on file  . Number of children: 1  . Years of education: Not on file  . Highest education level: Not on file  Occupational History  . Occupation: Retired    Fish farm manager: RETIRED    CommentAudiological scientist   Tobacco Use  . Smoking status: Never Smoker  . Smokeless tobacco: Never Used  Substance and Sexual Activity  . Alcohol use: No  . Drug use: No  . Sexual activity: Not Currently  Other Topics Concern  . Not on file  Social History Narrative   Relocated from [place]   She is currently "'.  She lives with her son (who is autistic).  She herself is disabled, and not working.   She never smoked and never drank alcohol.   She enjoys walking 7 days a week.   Social Determinants of Health   Financial Resource Strain:   . Difficulty of Paying Living Expenses:   Food Insecurity:   . Worried  About Charity fundraiser in the Last Year:   . Arboriculturist in the Last Year:   Transportation Needs:   . Film/video editor (Medical):   Marland Kitchen Lack of Transportation (Non-Medical):   Physical Activity:   . Days of Exercise per Week:   . Minutes of Exercise per Session:   Stress:   . Feeling of Stress :   Social Connections:   . Frequency of Communication with Friends and Family:   . Frequency of Social Gatherings with Friends and Family:   .  Attends Religious Services:   . Active Member of Clubs or Organizations:   . Attends Archivist Meetings:   Marland Kitchen Marital Status:   Intimate Partner Violence:   . Fear of Current or Ex-Partner:   . Emotionally Abused:   Marland Kitchen Physically Abused:   . Sexually Abused:      Physical Exam   Vitals:   12/10/19 0921 12/10/19 0930  BP: 138/78 (!) 157/89  Pulse: 99 98  Resp: 18 17  Temp: 99.5 F (37.5 C)   SpO2: 99% 97%    CONSTITUTIONAL: Well-appearing, NAD NEURO:  Alert and oriented x 3, no focal deficits EYES:  eyes equal and reactive ENT/NECK:  no LAD, no JVD CARDIO: Regular rate, well-perfused, normal S1 and S2 PULM:  CTAB no wheezing or rhonchi GI/GU:  normal bowel sounds, non-distended, non-tender MSK/SPINE:  No gross deformities, no edema SKIN:  no rash, atraumatic PSYCH:  Appropriate speech and behavior  *Additional and/or pertinent findings included in MDM below  Diagnostic and Interventional Summary    EKG Interpretation  Date/Time:    Ventricular Rate:    PR Interval:    QRS Duration:   QT Interval:    QTC Calculation:   R Axis:     Text Interpretation:        Labs Reviewed - No data to display  No orders to display    Medications  lidocaine (XYLOCAINE) 2 % viscous mouth solution 15 mL (15 mLs Mouth/Throat Given 12/10/19 0949)     Procedures  /  Critical Care Procedures  ED Course and Medical Decision Making  I have reviewed the triage vital signs, the nursing notes, and pertinent available records  from the EMR.  Listed above are laboratory and imaging tests that I personally ordered, reviewed, and interpreted and then considered in my medical decision making (see below for details).      Normal vital signs, no fever, no airway compromise, painful tongue and throat of unclear etiology.  No signs of thrush.  No clear or observable pathology to the mucosa on close inspection.  No obvious swelling.  Suspect pain related to recent dental work.  Doubt allergy given the lack of obvious swelling or other associated allergic symptoms.  Currently I see no benefit to testing here in the emergency department.  Will attempt symptomatic control with lidocaine, spoke with patient and son about my evaluation and they agree to follow-up with the dentist for further recommendations.  Strict return precautions for swelling.    Barth Kirks. Sedonia Small, MD [place] [email or web address]  Final Clinical Impressions(s) / ED Diagnoses     ICD-10-CM   1. Mouth pain  K13.79     ED Discharge Orders    None       Discharge Instructions Discussed with and Provided to Patient:     Discharge Instructions     You were evaluated in the Emergency Department and after careful evaluation, we did not find any emergent condition requiring admission or further testing in the hospital.  Your exam/testing today is overall reassuring.  We see no signs of significant allergy.  We recommend close follow-up with the dentist for further evaluation and management.  We recommend over-the-counter Orajel for comfort.  Please return to the Emergency Department if you experience any worsening of your condition.  We encourage you to follow up with a primary care provider.  Thank you for allowing [place] to be a part of your care.  Maudie Flakes, MD 12/10/19 [phone]

## 2019-12-10 NOTE — Discharge Instructions (Addendum)
You were evaluated in the Emergency Department and after careful evaluation, we did not find any emergent condition requiring admission or further testing in the hospital.  Your exam/testing today is overall reassuring.  We see no signs of significant allergy.  We recommend close follow-up with the dentist for further evaluation and management.  We recommend over-the-counter Orajel for comfort.  Please return to the Emergency Department if you experience any worsening of your condition.  We encourage you to follow up with a primary care provider.  Thank you for allowing [place] to be a part of your care.

## 2019-12-14 DIAGNOSIS — K1379 Other lesions of oral mucosa: Secondary | ICD-10-CM | POA: Diagnosis not present

## 2019-12-17 ENCOUNTER — Telehealth: Payer: Self-pay | Admitting: Cardiology

## 2019-12-17 NOTE — Progress Notes (Signed)
Cardiology Clinic Note   Patient Name: Peggy [name] Date of Encounter: 12/18/2019  Primary Care Provider:  [name], [place] Primary Cardiologist:  Glenetta Hew, MD  Patient Profile    Peggy [name]. [place] 74 year old female presents today for an evaluation of his chest pain and dizziness.  Past Medical History    Past Medical History:  Diagnosis Date  . Anxiety   . Arthritis    "all over"  . Asthma   . Bladder cancer ([place])   . Chronic bronchitis ([place])    'get it q yr"  . Chronic lower back pain   . Daily headache   . Deafness in left ear   . Depression   . DVT (deep venous thrombosis) ([place]) 2009/2010   "probably left leg"   . GERD (gastroesophageal reflux disease)   . History of cardiac catheterization 2005, 2012   Both catheterizations for "anginal symptoms "showed angiographically normal coronary arteries. -->  Negative Myoview June 2016.  Coronary calcification noted on chest CT  . History of kidney stones   . Hypercholesteremia   . Hypertension   . Nummular eczema   . Skin cancer    "right abdomen"   Past Surgical History:  Procedure Laterality Date  . ABDOMINAL HYSTERECTOMY  1980's  . APPENDECTOMY Right   . BACK SURGERY     x 2, lower  . BLADDER TUMOR EXCISION    . BREAST BIOPSY Right   . BREAST EXCISIONAL BIOPSY Right   . CAROTID ARTERY DOPPLERS   10/2017   Carotid Dopplers: 40-59% stenosis on the Right Internal Carotid, < 40% stenosis in the Left. Both vertebral arteries show normal flow. Left subclavian artery is normal, but there is some flow disturbance in the right subclavian artery.  . COLONOSCOPY WITH PROPOFOL N/A 02/26/2015   Procedure: COLONOSCOPY WITH PROPOFOL;  Surgeon: Carol Ada, MD;  Location: [facility];  Service: Endoscopy;  Laterality: N/A;  . COLONOSCOPY WITH PROPOFOL N/A 12/06/2017   Procedure: COLONOSCOPY WITH PROPOFOL;  Surgeon: Juanita Craver, MD;  Location: [facility];  Service: Endoscopy;  Laterality: N/A;  . CORONARY CALCIUM SCORE  & CT ANGIOGRAM  11/2017   Coronary calcium score 131 (moderate risk).  Moderate proximal LAD & LCx stenosis --> LOW RISK STUDY.  No evidence to suspect cardiac related chest pain --> correlates with cardiac catheterizations  . ENTEROSCOPY N/A 12/14/2017   Procedure: ENTEROSCOPY;  Surgeon: Carol Ada, MD;  Location: [facility];  Service: Endoscopy;  Laterality: N/A;  . ESOPHAGOGASTRODUODENOSCOPY Left 02/20/2015   Procedure: ESOPHAGOGASTRODUODENOSCOPY (EGD);  Surgeon: Milus Banister, MD;  Location: [facility];  Service: Endoscopy;  Laterality: Left;  . ESOPHAGOGASTRODUODENOSCOPY (EGD) WITH PROPOFOL N/A 12/06/2017   Procedure: ESOPHAGOGASTRODUODENOSCOPY (EGD) WITH PROPOFOL;  Surgeon: Juanita Craver, MD;  Location: [facility];  Service: Endoscopy;  Laterality: N/A;  . HOT HEMOSTASIS N/A 12/14/2017   Procedure: HOT HEMOSTASIS (ARGON PLASMA COAGULATION/BICAP);  Surgeon: Carol Ada, MD;  Location: [facility];  Service: Endoscopy;  Laterality: N/A;  . JOINT REPLACEMENT    . KNEE ARTHROSCOPY Right ~ 2000  . LEFT HEART CATH AND CORONARY ANGIOGRAPHY  11/'05; 2/'12   Angiographically normal coronary arteries  . LUMBAR DISC SURGERY  1980's X 1; ~ 2002  . MEDIAL PARTIAL KNEE REPLACEMENT Right 2001  . NM MYOVIEW LTD  02/2015   EF 65%.  No reversible ischemia.  Possible scar along the cardiac apex and anteroseptal wall.  (Mild severity, medium-sized defect in the apex anteroseptal wall noted on both resting and  stress images).  No inducible ischemia.  Read as LOW RISK  . PARTIAL KNEE ARTHROPLASTY Left 2009  . REPLACEMENT TOTAL KNEE BILATERAL Bilateral   . REVISION TOTAL KNEE ARTHROPLASTY Left   . SKIN CANCER EXCISION Right    "abdomen"  . TRANSTHORACIC ECHOCARDIOGRAM  06/'16; 2/'19    A) mod Conc LVH. EF 60-65%.  No RWMA. GR 1 D. Mild Ao Stenosis. Mild LA dilation.;  B) EF 60-65%.  Mild LVH.  No or W MA.  No significant valve disease.  Aortic stenosis not noted    Allergies  Allergies  Allergen  Reactions  . Penicillins Shortness Of Breath, Itching, Swelling, Rash and Other (See Comments)    Has patient had a PCN reaction causing immediate rash, facial/tongue/throat swelling, SOB or lightheadedness with hypotension: Yes Has patient had a PCN reaction causing severe rash involving mucus membranes or skin necrosis: No Has patient had a PCN reaction that required hospitalization: Yes Has patient had a PCN reaction occurring within the last 10 years: No If all of the above answers are "NO", then may proceed with Cephalosporin use.   . Azithromycin Itching, Swelling and Rash  . Levaquin [Levofloxacin In D5w] Hives, Itching, Swelling and Rash  . Clindamycin/Lincomycin Hives  . Erythromycin Base Itching  . Keflex [Cephalexin] Itching and Rash  . Sulfa Antibiotics Itching and Rash  . Tetracyclines & Related Itching and Rash    History of Present Illness    Ms. Wickson has a past medical history of hypertension, hyperlipidemia cardiac murmur and family history of coronary artery disease.  She has been noted to have normal coronary arteries by cardiac catheterization x2.  Underwent coronary CTA 3/19 that showed coronary calcium score of 131 (moderate risk) with moderate proximal LAD and circumflex stenosis, it was a low risk study.  She was seen seen on 4/19 for follow-up of chest pain and murmur evaluation.   She was last seen by Dr. Ellyn Hack on 12/12/2018 and indicated that she occasionally had sharp chest pain that was not associated with activity.  Pain was relieved by aspirin.  Stated she occasionally had to stop and rest while walking at [facility] due to his chest discomfort.  This has been occurring once per week.  Her chest discomfort was believed to be microvascular in nature versus musculoskeletal.  Her carotid ultrasound showed 1-39% stenosis right ICA and no stenosis in the left ICA.  Recommended repeat ultrasound in 2022.  Further ischemic evaluation was not recommended.  It was noted that  Lasix would help with his dyspnea on exertion.  Her dyspnea on exertion was noted with activities around his house and had slightly progressed over the last couple of years.  She also noted she had off and on increased heart rate due to circumstances that would upset her or cause him to be angry.  She denied palpitation.  She contacted nurse triage line on 12/16/2019 with complaints of on and off chest pain x2 weeks.  During these episodes she has nauseousness and sweating with radiation in his left arm.  She presents to the clinic today and states yesterday she walked for 2 hours in [place] and was chest pain-free.  She states she walks daily for varying amounts of time.  She has had occasional episodes of dizziness.  She described these as brief and when getting up from a seated or laying position.  She also describes some dyspnea with exertion.  She states that she also has occasional shortness of breath which is  improved by taking Lasix.  She continues to eat a low-sodium diet. She has received both of her COVID-19 vaccinations.  Today she denies chest pain, shortness of breath, lower extremity edema, fatigue, palpitations, melena, hematuria, hemoptysis, diaphoresis, weakness, presyncope, syncope, orthopnea, and PND.   Home Medications    Prior to Admission medications   Medication Sig Start Date End Date Taking? Authorizing Provider  acetaminophen (TYLENOL) 500 MG tablet Take 1,000 mg by mouth every 8 (eight) hours as needed for moderate pain or headache.     [provider]  ciprofloxacin (CIPRO) 500 MG tablet Take 1 tablet (500 mg total) by mouth 2 (two) times daily. 11/24/19   [name], FNP  diclofenac sodium (VOLTAREN) 1 % GEL Apply 2 g topically 4 (four) times daily. Patient not taking: Reported on 03/31/2019 12/26/18   [name], FNP  diphenhydrAMINE (ALLERGY RELIEF) 25 mg capsule Take 25 mg by mouth daily.    [provider]  FLUoxetine (PROZAC) 40 MG capsule  Take 40 mg by mouth daily as needed (depression).    [provider]  furosemide (LASIX) 20 MG tablet Take 1 tablet (20 mg total) by mouth as needed. For shortness of breathe or edema( swelling) 12/12/18   Leonie Man, MD  hydrocortisone cream 1 % Apply to affected area 2 times daily 11/24/19 11/23/20  [name], FNP  nystatin (NYSTATIN) powder Apply topically 4 (four) times daily. 07/01/19   [name], FNP  simvastatin (ZOCOR) 80 MG tablet Take 1 tablet (80 mg total) by mouth at bedtime. 07/01/19   [name], FNP  trandolapril ([place]) 2 MG tablet TAKE 1 TABLET(2 MG) BY MOUTH DAILY 11/18/19   [name], FNP  valACYclovir (VALTREX) 1000 MG tablet TAKE 1/2 TABLET BY MOUTH THREE TIMES DAILY AS NEEDED FOR OUTBREAK 11/18/19   [name], FNP  Vitamin D, Ergocalciferol, (DRISDOL) 1.25 MG (50000 UT) CAPS capsule TAKE 1 CAPSULE BY MOUTH 2 TIMES A WEEK 07/01/19   [name], FNP    Family History    Family History  Problem Relation Age of Onset  . Heart failure Mother   . Cancer Mother   . Hypertension Mother   . Hypertension Father   . Heart attack Father        Noted on cardiology intake form as "massive heart attack" at age 64  . Heart failure Sister   . Heart attack Sister        Not listed on her cardiology intake form  . Breast cancer Sister   . Heart failure Sister   . Heart attack Sister        Not listed on her cardiology intake  . Cancer Sister   . Breast cancer Sister    She indicated that her mother is deceased. She indicated that her father is deceased. She indicated that two of her eight sisters are alive. She indicated that all of her five brothers are deceased.  Social History    Social History   Socioeconomic History  . Marital status: Single    Spouse name: Not on file  . Number of children: 1  . Years of education: Not on file  . Highest education level: Not on file  Occupational History  . Occupation: Retired    Fish farm manager: RETIRED     CommentAudiological scientist   Tobacco Use  . Smoking status: Never Smoker  . Smokeless tobacco: Never Used  Substance and Sexual Activity  . Alcohol use: No  . Drug  use: No  . Sexual activity: Not Currently  Other Topics Concern  . Not on file  Social History Narrative   Relocated from [place]   She is currently "'.  She lives with her son (who is autistic).  She herself is disabled, and not working.   She never smoked and never drank alcohol.   She enjoys walking 7 days a week.   Social Determinants of Health   Financial Resource Strain:   . Difficulty of Paying Living Expenses:   Food Insecurity:   . Worried About Charity fundraiser in the Last Year:   . Arboriculturist in the Last Year:   Transportation Needs:   . Film/video editor (Medical):   Marland Kitchen Lack of Transportation (Non-Medical):   Physical Activity:   . Days of Exercise per Week:   . Minutes of Exercise per Session:   Stress:   . Feeling of Stress :   Social Connections:   . Frequency of Communication with Friends and Family:   . Frequency of Social Gatherings with Friends and Family:   . Attends Religious Services:   . Active Member of Clubs or Organizations:   . Attends Archivist Meetings:   Marland Kitchen Marital Status:   Intimate Partner Violence:   . Fear of Current or Ex-Partner:   . Emotionally Abused:   Marland Kitchen Physically Abused:   . Sexually Abused:      Review of Systems    General:  No chills, fever, night sweats or weight changes.  Cardiovascular:  No chest pain, dyspnea on exertion, edema, orthopnea, palpitations, paroxysmal nocturnal dyspnea. Dermatological: No rash, lesions/masses Respiratory: No cough, dyspnea Urologic: No hematuria, dysuria Abdominal:   No nausea, vomiting, diarrhea, bright red blood per rectum, melena, or hematemesis Neurologic:  No visual changes, wkns, changes in mental status. All other systems reviewed and are otherwise negative except as noted above.  Physical Exam      VS:  BP 138/68 (BP Location: Left Arm, Patient Position: Sitting, Cuff Size: Large)   Pulse 82   Ht 5' 4.5" (1.638 m)   Wt 207 lb (93.9 kg)   BMI 34.98 kg/m  , BMI Body mass index is 34.98 kg/m. GEN: Well nourished, well developed, in no acute distress. HEENT: normal. Neck: Supple, no JVD, carotid bruits, or masses. Cardiac: RRR, systolic murmur, rubs, or gallops. No clubbing, cyanosis, edema.  Radials/DP/PT 2+ and equal bilaterally.  Respiratory:  Respirations regular and unlabored, clear to auscultation bilaterally. GI: Soft, nontender, nondistended, BS + x 4. MS: no deformity or atrophy. Skin: warm and dry, no rash. Neuro:  Strength and sensation are intact. Psych: Normal affect.  Accessory Clinical Findings    ECG personally reviewed by me today-normal sinus rhythm cannot rule out anterior infarct undetermined age 3 bpm- No acute changes  Echocardiogram 10/19/2017 Study Conclusions   - Left ventricle: The cavity size was normal. Wall thickness was  increased in a pattern of mild LVH. Systolic function was normal.  The estimated ejection fraction was in the range of 60% to 65%.  Wall motion was normal; there were no regional wall motion  abnormalities. Doppler parameters are consistent with abnormal  left ventricular relaxation (grade 1 diastolic dysfunction).  - Aortic valve: There was no stenosis. There was trivial  regurgitation.  - Mitral valve: There was trivial regurgitation.  - Right ventricle: The cavity size was normal. Systolic function  was normal.  - Pulmonary arteries: No complete TR doppler jet  so unable to  estimate PA systolic pressure.  - Inferior vena cava: The vessel was normal in size. The  respirophasic diameter changes were in the normal range (>= 50%),  consistent with normal central venous pressure.   Impressions:   - Normal LV size with mild LV hypertrophy. EF 60-65%. Normal RV  size and systolic function. No  signficant valvular abnormalities.  Coronary CTA 07/07/2018 IMPRESSION: 1. Coronary artery calcium score 131 Agatston units. This places the patient in the 67th percentile for age and gender, suggesting intermediate risk for future cardiac events.  2.  Mild proximal LAD stenosis, mild proximal LCx stenosis.  Carotid ultrasound 10/18/2018 Summary:  Right Carotid: Velocities in the right ICA are consistent with a 1-39%  stenosis.   Left Carotid: There is no evidence of stenosis in the left ICA.   Assessment & Plan   1.  Chest pain-has had intermittent periods of chest pain for the last 2 weeks.Underwent coronary CTA 3/19 that showed coronary calcium score of 131 (moderate risk) with moderate proximal LAD and circumflex stenosis, it was a low risk study.  It appears to be musculoskeletal in nature.  Reproduced during physical exam. Patient given reassurance  Essential hypertension-BP today 136/68 Continue current therapy Heart healthy low-sodium diet-salty 6 given Increase physical activity as tolerated  Carotid stenosis-carotid Dopplers 2/20 showed 1-39% right ICA and no stenosis on left ICA Repeat carotid ultrasound 123456   Chronic systolic murmur-present on exam today.  Euvolemic today.  Echocardiogram 2/19 showed normal EF, G1 DD, trivial aortic regurgitation, and trivial mitral regurgitation. Continue Lasix Repeat echocardiogram   Mixed hyperlipidemia LDL 92 07/01/2019 Continue statin Heart healthy low-sodium high-fiber diet Increase physical activity as tolerated Recommend transition to rosuvastatin or atorvastatin Follow-up by PCP  Disposition: Follow-up with Dr. Ellyn Hack in 3 months.  Jossie Ng. [facility] [address] Office [phone] Fax [phone]

## 2019-12-17 NOTE — Telephone Encounter (Signed)
Spoke with pt who report off and on chest pain x 2 weeks. During episodes, pt report feeling nauseous and sweating. Pt also state today, pain radiated up her left arm. Pt denies symptoms at the moment. Appointment scheduled for tomorrow 4/15 at 9:15 with Coletta Memos for further evaluations. Pt advised to report to ER if symptoms reoccur. Pt verbalized understanding.

## 2019-12-17 NOTE — Telephone Encounter (Signed)
Pt c/o of Chest Pain: STAT if CP now or developed within 24 hours  1. Are you having CP right now? No  2. Are you experiencing any other symptoms (ex. SOB, nausea, vomiting, sweating)? Dizzines  3. How long have you been experiencing CP? About 2 weeks  4. Is your CP continuous or coming and going? Coming and going  5. Have you taken Nitroglycerin? No  STAT if patient feels like he/she is going to faint   1) Are you dizzy now? No  2) Do you feel faint or have you passed out? Yes  3) Do you have any other symptoms? No  4) Have you checked your HR and BP (record if available)? No recording available  Patient states she is requesting an order for a CAT scan to check for plaque in arteries. Please assist.     ?

## 2019-12-18 ENCOUNTER — Ambulatory Visit (INDEPENDENT_AMBULATORY_CARE_PROVIDER_SITE_OTHER): Payer: Medicare HMO | Admitting: General Practice

## 2019-12-18 ENCOUNTER — Other Ambulatory Visit: Payer: Self-pay

## 2019-12-18 ENCOUNTER — Encounter: Payer: Self-pay | Admitting: General Practice

## 2019-12-18 VITALS — BP 138/68 | HR 82 | Ht 64.5 in | Wt 207.0 lb

## 2019-12-18 DIAGNOSIS — R011 Cardiac murmur, unspecified: Secondary | ICD-10-CM

## 2019-12-18 DIAGNOSIS — I10 Essential (primary) hypertension: Secondary | ICD-10-CM

## 2019-12-18 DIAGNOSIS — E782 Mixed hyperlipidemia: Secondary | ICD-10-CM | POA: Diagnosis not present

## 2019-12-18 DIAGNOSIS — I2510 Atherosclerotic heart disease of native coronary artery without angina pectoris: Secondary | ICD-10-CM

## 2019-12-18 DIAGNOSIS — I6523 Occlusion and stenosis of bilateral carotid arteries: Secondary | ICD-10-CM

## 2019-12-18 NOTE — Patient Instructions (Signed)
Medication Instructions:  The current medical regimen is effective;  continue present plan and medications as directed. Please refer to the Current Medication list given to you today. *If you need a refill on your cardiac medications before your next appointment, please call your pharmacy*  Testing/Procedures: Echocardiogram - Your physician has requested that you have an echocardiogram. Echocardiography is a painless test that uses sound waves to create images of your heart. It provides your doctor with information about the size and shape of your heart and how well your heart's chambers and valves are working. This procedure takes approximately one hour. There are no restrictions for this procedure. This will be performed at our [facility] location - [address], [address].  Special Instructions PLEASE READ AND FOLLOW SALTY 6-ATTACHED  Follow-Up: Your next appointment:  3 month(s) Please call our office 2 months in advance to schedule this appointment In Person with Glenetta Hew, MD  At [facility], you and your health needs are our priority.  As part of our continuing mission to provide you with exceptional heart care, we have created designated Provider Care Teams.  These Care Teams include your primary Cardiologist (physician) and Advanced Practice Providers (APPs -  Physician Assistants and Nurse Practitioners) who all work together to provide you with the care you need, when you need it.  We recommend signing up for the patient portal called "MyChart".  Sign up information is provided on this After Visit Summary.  MyChart is used to connect with patients for Virtual Visits (Telemedicine).  Patients are able to view lab/test results, encounter notes, upcoming appointments, etc.  Non-urgent messages can be sent to your provider as well.   To learn more about what you can do with MyChart, go to [email or web address].

## 2019-12-23 ENCOUNTER — Ambulatory Visit: Payer: Medicare HMO | Admitting: Adult Health

## 2020-01-05 ENCOUNTER — Ambulatory Visit (HOSPITAL_COMMUNITY): Payer: Medicare HMO

## 2020-01-05 ENCOUNTER — Other Ambulatory Visit: Payer: Self-pay

## 2020-01-06 ENCOUNTER — Ambulatory Visit (HOSPITAL_COMMUNITY)
Admission: RE | Admit: 2020-01-06 | Discharge: 2020-01-06 | Disposition: A | Discharge status: Home / Self Care | Payer: Medicare HMO | Source: Ambulatory Visit | Attending: Adult Health | Admitting: Adult Health

## 2020-01-06 DIAGNOSIS — K219 Gastro-esophageal reflux disease without esophagitis: Secondary | ICD-10-CM | POA: Insufficient documentation

## 2020-01-06 DIAGNOSIS — E785 Hyperlipidemia, unspecified: Secondary | ICD-10-CM | POA: Insufficient documentation

## 2020-01-06 DIAGNOSIS — I119 Hypertensive heart disease without heart failure: Secondary | ICD-10-CM | POA: Insufficient documentation

## 2020-01-06 DIAGNOSIS — R011 Cardiac murmur, unspecified: Secondary | ICD-10-CM | POA: Diagnosis not present

## 2020-01-06 NOTE — Progress Notes (Signed)
  Echocardiogram 2D Echocardiogram has been performed.  Peggy [name] 01/06/2020, 1:58 PM

## 2020-01-19 ENCOUNTER — Other Ambulatory Visit: Payer: Self-pay

## 2020-01-19 MED ORDER — FUROSEMIDE 20 MG PO TABS: 20.0000 mg | ORAL_TABLET | ORAL | 11 refills | Status: DC | PRN

## 2020-02-05 DIAGNOSIS — F419 Anxiety disorder, unspecified: Secondary | ICD-10-CM | POA: Diagnosis not present

## 2020-02-12 ENCOUNTER — Emergency Department (HOSPITAL_COMMUNITY): Payer: Medicare HMO

## 2020-02-12 ENCOUNTER — Other Ambulatory Visit: Payer: Self-pay

## 2020-02-12 ENCOUNTER — Emergency Department (HOSPITAL_COMMUNITY)
Admission: EM | Admit: 2020-02-12 | Discharge: 2020-02-12 | Disposition: A | Discharge status: Home / Self Care | Payer: Medicare HMO | Attending: Emergency Medicine | Admitting: Emergency Medicine

## 2020-02-12 ENCOUNTER — Emergency Department (HOSPITAL_BASED_OUTPATIENT_CLINIC_OR_DEPARTMENT_OTHER)
Admit: 2020-02-12 | Discharge: 2020-02-12 | Disposition: A | Discharge status: Home / Self Care | Payer: Medicare HMO | Attending: Emergency Medicine | Admitting: Emergency Medicine

## 2020-02-12 ENCOUNTER — Encounter (HOSPITAL_COMMUNITY): Payer: Self-pay | Admitting: Emergency Medicine

## 2020-02-12 DIAGNOSIS — I517 Cardiomegaly: Secondary | ICD-10-CM | POA: Diagnosis not present

## 2020-02-12 DIAGNOSIS — M7989 Other specified soft tissue disorders: Secondary | ICD-10-CM

## 2020-02-12 DIAGNOSIS — R0602 Shortness of breath: Secondary | ICD-10-CM | POA: Insufficient documentation

## 2020-02-12 DIAGNOSIS — Z8551 Personal history of malignant neoplasm of bladder: Secondary | ICD-10-CM | POA: Insufficient documentation

## 2020-02-12 DIAGNOSIS — I10 Essential (primary) hypertension: Secondary | ICD-10-CM | POA: Diagnosis not present

## 2020-02-12 DIAGNOSIS — Z86718 Personal history of other venous thrombosis and embolism: Secondary | ICD-10-CM | POA: Diagnosis not present

## 2020-02-12 DIAGNOSIS — J45909 Unspecified asthma, uncomplicated: Secondary | ICD-10-CM | POA: Insufficient documentation

## 2020-02-12 DIAGNOSIS — J90 Pleural effusion, not elsewhere classified: Secondary | ICD-10-CM | POA: Diagnosis not present

## 2020-02-12 DIAGNOSIS — Z85828 Personal history of other malignant neoplasm of skin: Secondary | ICD-10-CM | POA: Diagnosis not present

## 2020-02-12 DIAGNOSIS — Z96653 Presence of artificial knee joint, bilateral: Secondary | ICD-10-CM | POA: Diagnosis not present

## 2020-02-12 DIAGNOSIS — R079 Chest pain, unspecified: Secondary | ICD-10-CM | POA: Diagnosis not present

## 2020-02-12 LAB — TROPONIN I (HIGH SENSITIVITY)
Troponin I (High Sensitivity): 8 ng/L (ref <18)
Troponin I (High Sensitivity): 7 ng/L (ref <18)

## 2020-02-12 LAB — CBC
HCT: 35 % — ABNORMAL LOW (ref 36.0–46.0)
Hemoglobin: 10.6 g/dL — ABNORMAL LOW (ref 12.0–15.0)
MCH: 27.7 pg (ref 26.0–34.0)
MCHC: 30.3 g/dL (ref 30.0–36.0)
MCV: 91.6 fL (ref 80.0–100.0)
Platelets: 376 10*3/uL (ref 150–400)
RBC: 3.82 MIL/uL — ABNORMAL LOW (ref 3.87–5.11)
RDW: 16.8 % — ABNORMAL HIGH (ref 11.5–15.5)
WBC: 9.2 10*3/uL (ref 4.0–10.5)
nRBC: 0 % (ref 0.0–0.2)

## 2020-02-12 LAB — BASIC METABOLIC PANEL
Anion gap: 10 (ref 5–15)
BUN: 10 mg/dL (ref 8–23)
CO2: 25 mmol/L (ref 22–32)
Calcium: 9.1 mg/dL (ref 8.9–10.3)
Chloride: 104 mmol/L (ref 98–111)
Creatinine, Ser: 0.98 mg/dL (ref 0.44–1.00)
GFR calc Af Amer: >60 mL/min (ref >60)
GFR calc non Af Amer: 56 mL/min — ABNORMAL LOW (ref >60)
Glucose, Bld: 117 mg/dL — ABNORMAL HIGH (ref 70–99)
Potassium: 4.2 mmol/L (ref 3.5–5.1)
Sodium: 139 mmol/L (ref 135–145)

## 2020-02-12 LAB — D-DIMER, QUANTITATIVE: D-Dimer, Quant: 1.73 ug/mL-FEU — ABNORMAL HIGH (ref 0.00–0.50)

## 2020-02-12 LAB — BRAIN NATRIURETIC PEPTIDE: B Natriuretic Peptide: 40.2 pg/mL (ref 0.0–100.0)

## 2020-02-12 MED ORDER — SODIUM CHLORIDE 0.9% FLUSH: 3.0000 mL | Freq: Once | INTRAVENOUS | Status: DC

## 2020-02-12 MED ORDER — IOHEXOL 350 MG/ML SOLN
75.0000 mL | Freq: Once | INTRAVENOUS | Status: AC | PRN
  Administered 2020-02-12: 75 mL via INTRAVENOUS

## 2020-02-12 NOTE — ED Provider Notes (Signed)
[facility] EMERGENCY DEPARTMENT Provider Note   CSN: [id number] Arrival date & time: 02/12/20  0160     History Chief Complaint  Patient presents with  . Chest Pain  . Shortness of Breath  . Leg Swelling    Peggy [name] is a 74 y.o. female.  HPI   This patient is a pleasant 74 year old female, she has a known history of DVT, also has a history of heart catheterization, has had heart catheterizations in the past which are negative for obstructive disease and a negative Myoview in June 2016.  The patient states that starting yesterday she was noting some significant swelling in her bilateral legs, she does take furosemide at home and instead of taking 20 mg she took 40 mg.  She is only taking that as needed.  She reports that the swelling in her legs is better today but she was concerned as was her family member about a blood clot.  She does feel a slight heaviness in her chest, feels some shortness of breath with it and has had some orthopnea.  Denies objective fevers chills or purulent sputum though she does have occasional coughing.  Symptoms are mild at this time but persistent.  Past Medical History:  Diagnosis Date  . Anxiety   . Arthritis    "all over"  . Asthma   . Bladder cancer ([place])   . Chronic bronchitis ([place])    'get it q yr"  . Chronic lower back pain   . Daily headache   . Deafness in left ear   . Depression   . DVT (deep venous thrombosis) ([place]) 2009/2010   "probably left leg"   . GERD (gastroesophageal reflux disease)   . History of cardiac catheterization 2005, 2012   Both catheterizations for "anginal symptoms "showed angiographically normal coronary arteries. -->  Negative Myoview June 2016.  Coronary calcification noted on chest CT  . History of kidney stones   . Hypercholesteremia   . Hypertension   . Nummular eczema   . Skin cancer    "right abdomen"    Patient Active Problem List   Diagnosis Date Noted  . Chronic pain  of left knee 12/11/2018  . Leg swelling 12/12/2017  . Chest pain with low risk for cardiac etiology 10/11/2017  . Systolic ejection murmur 10/93/2355  . Bilateral carotid bruits 10/11/2017  . Coronary artery calcification seen on CAT scan 10/11/2017  . Anemia, iron deficiency 02/20/2015  . Musculoskeletal chest pain 02/20/2015  . Chest pain at rest 02/18/2015  . Mixed hyperlipidemia 02/18/2015  . Sciatica 12/10/2012  . Back pain, lumbosacral 12/10/2012  . Essential hypertension 12/10/2012  . Spinal stenosis 12/10/2012  . DDD (degenerative disc disease), lumbar 12/10/2012    Past Surgical History:  Procedure Laterality Date  . ABDOMINAL HYSTERECTOMY  1980's  . APPENDECTOMY Right   . BACK SURGERY     x 2, lower  . BLADDER TUMOR EXCISION    . BREAST BIOPSY Right   . BREAST EXCISIONAL BIOPSY Right   . CAROTID ARTERY DOPPLERS   10/2017   Carotid Dopplers: 40-59% stenosis on the Right Internal Carotid, < 40% stenosis in the Left. Both vertebral arteries show normal flow. Left subclavian artery is normal, but there is some flow disturbance in the right subclavian artery.  . COLONOSCOPY WITH PROPOFOL N/A 02/26/2015   Procedure: COLONOSCOPY WITH PROPOFOL;  Surgeon: Carol Ada, MD;  Location: [facility] ENDOSCOPY;  Service: Endoscopy;  Laterality: N/A;  . COLONOSCOPY WITH PROPOFOL  N/A 12/06/2017   Procedure: COLONOSCOPY WITH PROPOFOL;  Surgeon: Juanita Craver, MD;  Location: [facility];  Service: Endoscopy;  Laterality: N/A;  . CORONARY CALCIUM SCORE & CT ANGIOGRAM  11/2017   Coronary calcium score 131 (moderate risk).  Moderate proximal LAD & LCx stenosis --> LOW RISK STUDY.  No evidence to suspect cardiac related chest pain --> correlates with cardiac catheterizations  . ENTEROSCOPY N/A 12/14/2017   Procedure: ENTEROSCOPY;  Surgeon: Carol Ada, MD;  Location: [facility];  Service: Endoscopy;  Laterality: N/A;  . ESOPHAGOGASTRODUODENOSCOPY Left 02/20/2015   Procedure:  ESOPHAGOGASTRODUODENOSCOPY (EGD);  Surgeon: Milus Banister, MD;  Location: [place];  Service: Endoscopy;  Laterality: Left;  . ESOPHAGOGASTRODUODENOSCOPY (EGD) WITH PROPOFOL N/A 12/06/2017   Procedure: ESOPHAGOGASTRODUODENOSCOPY (EGD) WITH PROPOFOL;  Surgeon: Juanita Craver, MD;  Location: [facility];  Service: Endoscopy;  Laterality: N/A;  . HOT HEMOSTASIS N/A 12/14/2017   Procedure: HOT HEMOSTASIS (ARGON PLASMA COAGULATION/BICAP);  Surgeon: Carol Ada, MD;  Location: [facility];  Service: Endoscopy;  Laterality: N/A;  . JOINT REPLACEMENT    . KNEE ARTHROSCOPY Right ~ 2000  . LEFT HEART CATH AND CORONARY ANGIOGRAPHY  11/'05; 2/'12   Angiographically normal coronary arteries  . LUMBAR DISC SURGERY  1980's X 1; ~ 2002  . MEDIAL PARTIAL KNEE REPLACEMENT Right 2001  . NM MYOVIEW LTD  02/2015   EF 65%.  No reversible ischemia.  Possible scar along the cardiac apex and anteroseptal wall.  (Mild severity, medium-sized defect in the apex anteroseptal wall noted on both resting and stress images).  No inducible ischemia.  Read as LOW RISK  . PARTIAL KNEE ARTHROPLASTY Left 2009  . REPLACEMENT TOTAL KNEE BILATERAL Bilateral   . REVISION TOTAL KNEE ARTHROPLASTY Left   . SKIN CANCER EXCISION Right    "abdomen"  . TRANSTHORACIC ECHOCARDIOGRAM  06/'16; 2/'19    A) mod Conc LVH. EF 60-65%.  No RWMA. GR 1 D. Mild Ao Stenosis. Mild LA dilation.;  B) EF 60-65%.  Mild LVH.  No or W MA.  No significant valve disease.  Aortic stenosis not noted     OB History   No obstetric history on file.     Family History  Problem Relation Age of Onset  . Heart failure Mother   . Cancer Mother   . Hypertension Mother   . Hypertension Father   . Heart attack Father        Noted on cardiology intake form as "massive heart attack" at age 47  . Heart failure Sister   . Heart attack Sister        Not listed on her cardiology intake form  . Breast cancer Sister   . Heart failure Sister   . Heart attack  Sister        Not listed on her cardiology intake  . Cancer Sister   . Breast cancer Sister     Social History   Tobacco Use  . Smoking status: Never Smoker  . Smokeless tobacco: Never Used  Vaping Use  . Vaping Use: Never used  Substance Use Topics  . Alcohol use: No  . Drug use: No    Home Medications Prior to Admission medications   Medication Sig Start Date End Date Taking? Authorizing Provider  acetaminophen (TYLENOL) 500 MG tablet Take 1,000 mg by mouth 2 (two) times daily as needed for moderate pain or headache.    Yes [provider]  diphenhydrAMINE (ALLERGY RELIEF) 25 mg capsule Take 25 mg by  mouth daily as needed for allergies.    Yes [provider]  FLUoxetine (PROZAC) 40 MG capsule Take 40 mg by mouth daily as needed (depression).   Yes [provider]  furosemide (LASIX) 20 MG tablet Take 1 tablet (20 mg total) by mouth as needed. For shortness of breathe or edema( swelling) Patient taking differently: Take 20-40 mg by mouth as needed for fluid. For shortness of breathe or edema( swelling) 01/19/20  Yes Leonie Man, MD  hydrocortisone cream 1 % Apply to affected area 2 times daily Patient taking differently: Apply 1 application topically daily as needed for itching.  11/24/19 11/23/20 Yes [name], FNP  nystatin (NYSTATIN) powder Apply topically 4 (four) times daily. Patient taking differently: Apply 1 application topically 2 (two) times daily as needed (discoloration).  07/01/19  Yes [name], FNP  simvastatin (ZOCOR) 80 MG tablet Take 1 tablet (80 mg total) by mouth at bedtime. 07/01/19  Yes [name], FNP  trandolapril ([place]) 2 MG tablet TAKE 1 TABLET(2 MG) BY MOUTH DAILY Patient taking differently: Take 2 mg by mouth daily.  11/18/19  Yes [name], FNP  valACYclovir (VALTREX) 1000 MG tablet TAKE 1/2 TABLET BY MOUTH THREE TIMES DAILY AS NEEDED FOR OUTBREAK Patient taking differently: Take 500 mg by mouth 3 (three)  times daily as needed (viral out break).  11/18/19  Yes [name], FNP  diclofenac sodium (VOLTAREN) 1 % GEL Apply 2 g topically 4 (four) times daily. Patient not taking: Reported on 02/12/2020 12/26/18   [name], FNP    Allergies    Penicillins, Azithromycin, Levaquin [levofloxacin in d5w], Clindamycin/lincomycin, Erythromycin base, Keflex [cephalexin], Sulfa antibiotics, and Tetracyclines & related  Review of Systems   Review of Systems  All other systems reviewed and are negative.   Physical Exam Updated Vital Signs BP 140/70   Pulse 68   Temp 98.5 F (36.9 C) (Oral)   Resp 17   Ht 1.626 m (5\' 4" )   Wt 90.7 kg   SpO2 95%   BMI 34.33 kg/m   Physical Exam Vitals and nursing note reviewed.  Constitutional:      General: She is not in acute distress.    Appearance: She is well-developed.  HENT:     Head: Normocephalic and atraumatic.     Mouth/Throat:     Pharynx: No oropharyngeal exudate.  Eyes:     General: No scleral icterus.       Right eye: No discharge.        Left eye: No discharge.     Conjunctiva/sclera: Conjunctivae normal.     Pupils: Pupils are equal, round, and reactive to light.  Neck:     Thyroid: No thyromegaly.     Vascular: No JVD.  Cardiovascular:     Rate and Rhythm: Regular rhythm. Tachycardia present.     Heart sounds: Normal heart sounds. No murmur heard.  No friction rub. No gallop.   Pulmonary:     Effort: Pulmonary effort is normal. No respiratory distress.     Breath sounds: Normal breath sounds. No wheezing or rales.  Abdominal:     General: Bowel sounds are normal. There is no distension.     Palpations: Abdomen is soft. There is no mass.     Tenderness: There is no abdominal tenderness.  Musculoskeletal:        General: No tenderness. Normal range of motion.     Cervical back: Normal range of motion and neck supple.  Right lower leg: Edema present.     Left lower leg: Edema present.  Lymphadenopathy:     Cervical: No  cervical adenopathy.  Skin:    General: Skin is warm and dry.     Findings: No erythema or rash.  Neurological:     General: No focal deficit present.     Mental Status: She is alert. Mental status is at baseline.     Coordination: Coordination normal.  Psychiatric:        Behavior: Behavior normal.     ED Results / Procedures / Treatments   Labs (all labs ordered are listed, but only abnormal results are displayed) Labs Reviewed  BASIC METABOLIC PANEL - Abnormal; Notable for the following components:      Result Value   Glucose, Bld 117 (*)    GFR calc non Af Amer 56 (*)    All other components within normal limits  CBC - Abnormal; Notable for the following components:   RBC 3.82 (*)    Hemoglobin 10.6 (*)    HCT 35.0 (*)    RDW 16.8 (*)    All other components within normal limits  D-DIMER, QUANTITATIVE (NOT AT [facility]) - Abnormal; Notable for the following components:   D-Dimer, Quant 1.73 (*)    All other components within normal limits  BRAIN NATRIURETIC PEPTIDE  TROPONIN I (HIGH SENSITIVITY)  TROPONIN I (HIGH SENSITIVITY)    EKG EKG Interpretation  Date/Time:  Thursday February 12 2020 09:10:18 EDT Ventricular Rate:  84 PR Interval:  160 QRS Duration: 80 QT Interval:  388 QTC Calculation: 458 R Axis:   27 Text Interpretation: Normal sinus rhythm Cannot rule out Inferior infarct , age undetermined Anterior infarct , age undetermined Abnormal ECG since last tracing no significant change Confirmed by Noemi Chapel [phone]) on 02/12/2020 9:44:48 AM   Radiology DG Chest 2 View  Result Date: 02/12/2020 CLINICAL DATA:  74 year old female with history of chest pain intermittently for the past several days worsening yesterday. EXAM: CHEST - 2 VIEW COMPARISON:  Chest x-ray 09/04/2017. FINDINGS: Lung volumes are normal. No consolidative airspace disease. No pleural effusions. No pneumothorax. No suspicious appearing pulmonary nodules or masses are noted. No evidence of pulmonary  edema. Heart size is mildly enlarged (unchanged). Upper mediastinal contours are within normal limits. Aortic atherosclerosis. IMPRESSION: 1. No radiographic evidence of acute cardiopulmonary disease. 2. Aortic atherosclerosis. 3. Mild cardiomegaly. Electronically Signed   By: Vinnie Langton M.D.   On: 02/12/2020 10:02   CT Angio Chest PE W and/or Wo Contrast  Result Date: 02/12/2020 CLINICAL DATA:  Shortness of breath EXAM: CT ANGIOGRAPHY CHEST WITH CONTRAST TECHNIQUE: Multidetector CT imaging of the chest was performed using the standard protocol during bolus administration of intravenous contrast. Multiplanar CT image reconstructions and MIPs were obtained to evaluate the vascular anatomy. CONTRAST:  82mL OMNIPAQUE IOHEXOL 350 MG/ML SOLN COMPARISON:  None. FINDINGS: Cardiovascular: Examination for pulmonary embolism is somewhat limited by contrast bolus and motion artifact. Within this limitation, no evidence of pulmonary embolism through the proximal segmental pulmonary arterial level. Normal heart size. Left coronary artery calcifications. No pericardial effusion. Scattered aortic atherosclerosis. Mediastinum/Nodes: No enlarged mediastinal, hilar, or axillary lymph nodes. Thyroid gland, trachea, and esophagus demonstrate no significant findings. Lungs/Pleura: Lungs are clear. No pleural effusion or pneumothorax. Upper Abdomen: No acute abnormality. Musculoskeletal: No chest wall abnormality. Disc degenerative disease and ankylosis of the thoracic spine. Review of the MIP images confirms the above findings. IMPRESSION: 1. Examination for pulmonary embolism is  somewhat limited by contrast bolus and motion artifact. Within this limitation, no evidence of pulmonary embolism through the proximal segmental pulmonary arterial level. 2.  Coronary artery disease.  Aortic Atherosclerosis (ICD10-I70.0). Electronically Signed   By: Eddie Candle M.D.   On: 02/12/2020 14:43   VAS [place] LOWER EXTREMITY VENOUS (DVT) (ONLY  [facility])  Result Date: 02/12/2020  Lower Venous DVTStudy Indications: Swelling.  Risk Factors: None identified. Limitations: Body habitus and poor ultrasound/tissue interface. Comparison Study: No prior studies. Performing Technologist: Oliver Hum RVT  Examination Guidelines: A complete evaluation includes B-mode imaging, spectral Doppler, color Doppler, and power Doppler as needed of all accessible portions of each vessel. Bilateral testing is considered an integral part of a complete examination. Limited examinations for reoccurring indications may be performed as noted. The reflux portion of the exam is performed with the patient in reverse Trendelenburg.  +---------+---------------+---------+-----------+----------+--------------+ RIGHT    CompressibilityPhasicitySpontaneityPropertiesThrombus Aging +---------+---------------+---------+-----------+----------+--------------+ CFV      Full           Yes      Yes                                 +---------+---------------+---------+-----------+----------+--------------+ SFJ      Full                                                        +---------+---------------+---------+-----------+----------+--------------+ FV Prox  Full                                                        +---------+---------------+---------+-----------+----------+--------------+ FV Mid                  Yes      Yes                                 +---------+---------------+---------+-----------+----------+--------------+ FV Distal               Yes      Yes                                 +---------+---------------+---------+-----------+----------+--------------+ PFV      Full                                                        +---------+---------------+---------+-----------+----------+--------------+ POP      Full           Yes      Yes                                  +---------+---------------+---------+-----------+----------+--------------+ PTV      Full                                                        +---------+---------------+---------+-----------+----------+--------------+  PERO     Full                                                        +---------+---------------+---------+-----------+----------+--------------+   +---------+---------------+---------+-----------+----------+--------------+ LEFT     CompressibilityPhasicitySpontaneityPropertiesThrombus Aging +---------+---------------+---------+-----------+----------+--------------+ CFV      Full           Yes      Yes                                 +---------+---------------+---------+-----------+----------+--------------+ SFJ      Full                                                        +---------+---------------+---------+-----------+----------+--------------+ FV Prox  Full                                                        +---------+---------------+---------+-----------+----------+--------------+ FV Mid   Full                                                        +---------+---------------+---------+-----------+----------+--------------+ FV Distal               Yes      Yes                                 +---------+---------------+---------+-----------+----------+--------------+ PFV      Full                                                        +---------+---------------+---------+-----------+----------+--------------+ POP      Full           Yes      Yes                                 +---------+---------------+---------+-----------+----------+--------------+ PTV      Full                                                        +---------+---------------+---------+-----------+----------+--------------+ PERO     Full                                                         +---------+---------------+---------+-----------+----------+--------------+  Summary: RIGHT: - There is no evidence of deep vein thrombosis in the lower extremity. However, portions of this examination were limited- see technologist comments above.  - No cystic structure found in the popliteal fossa.  LEFT: - There is no evidence of deep vein thrombosis in the lower extremity. However, portions of this examination were limited- see technologist comments above.  - No cystic structure found in the popliteal fossa.  *See table(s) above for measurements and observations.    Preliminary     Procedures Procedures (including critical care time)  Medications Ordered in ED Medications  sodium chloride flush (NS) 0.9 % injection 3 mL (3 mLs Intravenous Not Given 02/12/20 0945)  iohexol (OMNIPAQUE) 350 MG/ML injection 75 mL (75 mLs Intravenous Contrast Given 02/12/20 1402)    ED Course  I have reviewed the triage vital signs and the nursing notes.  Pertinent labs & imaging results that were available during my care of the patient were reviewed by me and considered in my medical decision making (see chart for details).    MDM Rules/Calculators/A&P                          Echocardiogram was performed May 2021 showing ejection fraction of 60 to 67%, grade 1 diastolic dysfunction with mild left ventricular hypertrophy.  This patient is not anticoagulated, though she does take a baby aspirin.  We will need to be evaluated for, DVT, less likely to be CHF  Troponin and BNP will be ordered, I have personally viewed the chest x-ray, there is no signs of infiltrate or pneumothorax, clear chest x-ray with normal mediastinum skin and soft tissues as well.  Additionally there is no findings of change in the EKG, no signs of ischemia or arrhythmia.  The patient symptoms have totally abated, she has normal CT scan, ultrasound of the legs without findings of DVT and at this point is feeling much better, she has  no signs of pathologic causes of her symptoms that would necessitate admission to the hospital, she is aware of her results and the indications for return and agreeable to the plan.  Final Clinical Impression(s) / ED Diagnoses Final diagnoses:  Shortness of breath  Leg swelling    Rx / DC Orders ED Discharge Orders    None       Noemi Chapel, MD 02/12/20 1505

## 2020-02-12 NOTE — ED Notes (Addendum)
Patient verbalized understanding of dc instructions, vss, ambulatory with nad.  Pts son called to pick her up, per her request.

## 2020-02-12 NOTE — ED Triage Notes (Signed)
To ED via POV with c/o chest pain started yesterday, intermittently for a few days- worse yesterday- swelling bilat legs- takes Lasix, doubled up yesterday on Lasix, states legs are better now

## 2020-02-12 NOTE — Progress Notes (Signed)
Bilateral lower extremity venous duplex has been completed. Preliminary results can be found in CV Proc through chart review.  Results were given to Dr. Sabra Heck.  02/12/20 12:15 PM Peggy [name] RVT

## 2020-02-12 NOTE — Discharge Instructions (Signed)
Your testing in the emergency department has not shown any signs of blood clots, pneumonia or any other heart problems.  This is reassuring, you may take an extra dose of Lasix tomorrow but then go back to your normal dose.  If you should develop increasing pain or shortness of breath please return to the emergency department immediately for repeat evaluation otherwise see your doctor in 72 hours for recheck

## 2020-02-12 NOTE — ED Notes (Signed)
Vascular at bedside

## 2020-02-16 ENCOUNTER — Telehealth: Payer: Self-pay

## 2020-02-16 NOTE — Telephone Encounter (Signed)
Left pt vm to call the office YL,RMA 

## 2020-02-16 NOTE — Telephone Encounter (Signed)
I have reviewed ER report. Is she still having symptoms? She should elevate legs and decrease salt intake.

## 2020-02-16 NOTE — Telephone Encounter (Signed)
Patient called stating she was in ER on 6/10 for chest pain,SOB and leg swelling. She is going to see her cardiologist on 7/14. She wanted to know if she still needs to see [place] given we cant schedule her within 3 days? YL,RMA

## 2020-03-17 ENCOUNTER — Telehealth: Payer: Self-pay | Admitting: Radiology

## 2020-03-17 ENCOUNTER — Other Ambulatory Visit: Payer: Self-pay

## 2020-03-17 ENCOUNTER — Ambulatory Visit (INDEPENDENT_AMBULATORY_CARE_PROVIDER_SITE_OTHER): Payer: Medicare HMO | Admitting: Cardiology

## 2020-03-17 ENCOUNTER — Encounter: Payer: Self-pay | Admitting: Cardiology

## 2020-03-17 VITALS — BP 122/72 | HR 68 | Ht 64.0 in | Wt 205.4 lb

## 2020-03-17 DIAGNOSIS — R011 Cardiac murmur, unspecified: Secondary | ICD-10-CM

## 2020-03-17 DIAGNOSIS — M7989 Other specified soft tissue disorders: Secondary | ICD-10-CM | POA: Diagnosis not present

## 2020-03-17 DIAGNOSIS — R002 Palpitations: Secondary | ICD-10-CM

## 2020-03-17 DIAGNOSIS — I2510 Atherosclerotic heart disease of native coronary artery without angina pectoris: Secondary | ICD-10-CM | POA: Diagnosis not present

## 2020-03-17 DIAGNOSIS — R079 Chest pain, unspecified: Secondary | ICD-10-CM

## 2020-03-17 NOTE — Telephone Encounter (Signed)
Enrolled patient for a 14 day Zio monitor to be mailed to patients home.  

## 2020-03-17 NOTE — Patient Instructions (Addendum)
Medication Instructions:  If you have any swelling you may take an  double your dose of Lasix  ( total  40 mg ) for that day   *If you need a refill on your cardiac medications before your next appointment, please call your pharmacy*   Lab Work: Not needed    Testing/Procedures: Your physician has recommended that you wear a   14  DAY ZIO-PATCH monitor. The Zio patch cardiac monitor continuously records heart rhythm data for up to 14 days, this is for patients being evaluated for multiple types heart rhythms. For the first 24 hours post application, please avoid getting the Zio monitor wet in the shower or by excessive sweating during exercise. After that, feel free to carry on with regular activities. Keep soaps and lotions away from the ZIO XT Patch.  This will be mailed to you, please expect 7-10 days to receive.    Applying the monitor   Shave hair from upper left chest.   Hold abrader disc by orange tab.  Rub abrader in 40 strokes over left upper chest as indicated in your monitor instructions.   Clean area with 4 enclosed alcohol pads .  Use all pads to assure are is cleaned thoroughly.  Let dry.   Apply patch as indicated in monitor instructions.  Patch will be place under collarbone on left side of chest with arrow pointing upward.   Rub patch adhesive wings for 2 minutes.Remove white label marked "1".  Remove white label marked "2".  Rub patch adhesive wings for 2 additional minutes.   While looking in a mirror, press and release button in center of patch.  A small green light will flash 3-4 times .  This will be your only indicator the monitor has been turned on.     Do not shower for the first 24 hours.  You may shower after the first 24 hours.   Press button if you feel a symptom. You will hear a small click.  Record Date, Time and Symptom in the Patient Log Book.   When you are ready to remove patch, follow instructions on last 2 pages of Patient Log Book.  Stick patch  monitor onto last page of Patient Log Book.   Place Patient Log Book in [place] box.  Use locking tab on box and tape box closed securely.  The [employer] has [employer] on it.  Please place in mailbox as soon as possible.  Your physician should have your test results approximately 7 days after the monitor has been mailed back to [facility].   Call [place] at [phone] if you have questions regarding your ZIO XT patch monitor.  Call them immediately if you see an orange light blinking on your monitor.   If your monitor falls off in less than 4 days contact our Monitor department at [phone].  If your monitor becomes loose or falls off after 4 days call [employer] at [phone] for suggestions on securing your monitor    Follow-Up: At [facility], you and your health needs are our priority.  As part of our continuing mission to provide you with exceptional heart care, we have created designated Provider Care Teams.  These Care Teams include your primary Cardiologist (physician) and Advanced Practice Providers (APPs -  Physician Assistants and Nurse Practitioners) who all work together to provide you with the care you need, when you need it.  We recommend signing up for the patient portal called "  MyChart".  Sign up information is provided on this After Visit Summary.  MyChart is used to connect with patients for Virtual Visits (Telemedicine).  Patients are able to view lab/test results, encounter notes, upcoming appointments, etc.  Non-urgent messages can be sent to your provider as well.   To learn more about what you can do with MyChart, go to [email or web address].    Your next appointment:   6 week(s)  The format for your next appointment:   In Person  Provider:   Glenetta Hew, MD   Other Instructions Not needed    ZIO XT- Long Term Monitor Instructions   Your physician has requested you wear your ZIO patch monitor____14___days.     This is a single patch monitor.  [employer] supplies one patch monitor per enrollment.  Additional stickers are not available.   Please do not apply patch if you will be having a Nuclear Stress Test, Echocardiogram, Cardiac CT, MRI, or Chest Xray during the time frame you would be wearing the monitor. The patch cannot be worn during these tests.  You cannot remove and re-apply the ZIO XT patch monitor.   Your ZIO patch monitor will be sent USPS Priority mail from [facility] directly to your home address. The monitor may also be mailed to a PO BOX if home delivery is not available.   It may take 3-5 days to receive your monitor after you have been enrolled.   Once you have received you monitor, please review enclosed instructions.  Your monitor has already been registered assigning a specific monitor serial # to you.   Applying the monitor   Shave hair from upper left chest.   Hold abrader disc by orange tab.  Rub abrader in 40 strokes over left upper chest as indicated in your monitor instructions.   Clean area with 4 enclosed alcohol pads .  Use all pads to assure are is cleaned thoroughly.  Let dry.   Apply patch as indicated in monitor instructions.  Patch will be place under collarbone on left side of chest with arrow pointing upward.   Rub patch adhesive wings for 2 minutes.Remove white label marked "1".  Remove white label marked "2".  Rub patch adhesive wings for 2 additional minutes.   While looking in a mirror, press and release button in center of patch.  A small green light will flash 3-4 times .  This will be your only indicator the monitor has been turned on.     Do not shower for the first 24 hours.  You may shower after the first 24 hours.   Press button if you feel a symptom. You will hear a small click.  Record Date, Time and Symptom in the Patient Log Book.   When you are ready to remove patch, follow instructions on last 2 pages of Patient Log Book.  Stick  patch monitor onto last page of Patient Log Book.   Place Patient Log Book in [place] box.  Use locking tab on box and tape box closed securely.  The [employer] has [employer] on it.  Please place in mailbox as soon as possible.  Your physician should have your test results approximately 7 days after the monitor has been mailed back to [facility].   Call [facility] [place] at [phone] if you have questions regarding your ZIO XT patch monitor.  Call them immediately if you see an orange light blinking on your monitor.   If your monitor  falls off in less than 4 days contact our Monitor department at [phone].  If your monitor becomes loose or falls off after 4 days call [employer] at [phone] for suggestions on securing your monitor.

## 2020-03-17 NOTE — Progress Notes (Signed)
Primary Care Provider: [name], FNP Cardiologist: Glenetta Hew, MD Electrophysiologist: None  Clinic Note: No chief complaint on file.    HPI:    Peggy [name] is a 74 y.o. female with a PMH below who presents today for hospital/ER follow-up.  PMH includes: Hypertension hyperlipidemia family history of CAD -> she does have noted coronary artery calcification on CT scan but has had negative ischemic evaluations in the past...  February 2019-Coronary Calcium Score & CTA: Coronary calcium score 131 (moderate risk).  Moderate proximal LAD & LCx stenosis --> LOW RISK STUDY.  No evidence to suspect cardiac related chest pain  I have not seen Peggy [name] in person since April 2019. I saw her via telemedicine in April 2020 after seeing her PCP with left shoulder and arm pain and occasional sharp chest pain-no associate with rest or exertion.  Better after aspirin.  Occasionally has to stop to rest in [place] because of hurting her chest.  Exertional dyspnea limited by use of as needed Lasix.  She does have exertional dyspnea walking simply across the room.  No associated PND but there is noted orthopnea and occasional edema.  Occasional palpitations, but nothing dramatic.  Peggy [name] was last seen on December 18, 2019 by Coletta Memos for evaluation of chest pain and dizziness -> chest pain was reproducible on physical exam.  Patient was reassured.  Repeat echocardiogram ordered to evaluate murmur  Recent Hospitalizations:   February 12, 2020--[place], ER: Bilateral leg swelling and shortness of breath.  D-dimer 1.73.  Troponin negative.  Lower extremity venous Dopplers--no DVT.  No cystic structure in popliteal fossa.  CTA chest: Limited study without obvious evidence of pulmonary embolism through the proximal segmental pulmonary arterial level.  Aortic and coronary artery atherosclerosis noted.  Reviewed  CV studies:    The following studies were reviewed today: (if available,  images/films reviewed: From Epic Chart or Care Everywhere) . TTE 01/06/2020: Normal LV systolic function.  Mild LVH.  GR 1 DD.  EF 60 to 65%.  No R WMA.  Trace AI.  No AS   Interval History:   Peggy [name] returns here today still having chest discomfort which she describes as a tightness that can happen both with and without exertion.  He noted that for the last week or so that she is been having her heart rate going up and being somewhat erratic.  When she went to the emergency room on June 10 with dyspnea and leg swelling along with orthopnea that have been going on for couple days.  The swelling has subsequently gone down now. She has gotten better now since the ER visit and has been taking her Lasix more frequently.  Cardiovascular Review of Symptoms (Summary) : positive for - edema, irregular heartbeat, orthopnea, palpitations, paroxysmal nocturnal dyspnea, rapid heart rate and Off-and-on chest fullness with and without exertion negative for - shortness of breath or Despite having rapid palpitation spells, no lightheadedness dizziness or syncope/near syncope, TIA/amaurosis fugax, claudication  The patient does not have symptoms concerning for COVID-19 infection (fever, chills, cough, or new shortness of breath).  The patient is not practicing social distancing & Masking.     REVIEWED OF SYSTEMS   Review of Systems  Constitutional: Positive for malaise/fatigue (Has been little more tired than usual lately). Negative for weight loss.  HENT: Negative for nosebleeds.   Respiratory: Positive for shortness of breath (Noted in HPI).   Cardiovascular:       Per  HPI  Gastrointestinal: Negative for blood in stool and melena.  Genitourinary: Negative for hematuria.  Musculoskeletal: Positive for joint pain.  Neurological: Positive for dizziness. Negative for focal weakness, weakness and headaches.  Psychiatric/Behavioral: Negative for memory loss. The patient is nervous/anxious. The patient  does not have insomnia.        Seems to be doing okay now, but was grieving the loss of her sister last month   I have reviewed and (if needed) personally updated the patient's problem list, medications, allergies, past medical and surgical history, social and family history.   PAST MEDICAL HISTORY   Past Medical History:  Diagnosis Date  . Anxiety   . Arthritis    "all over"  . Asthma   . Bladder cancer ([place])   . Chronic bronchitis ([place])    'get it q yr"  . Chronic lower back pain   . Daily headache   . Deafness in left ear   . Depression   . DVT (deep venous thrombosis) ([place]) 2009/2010   "probably left leg"   . GERD (gastroesophageal reflux disease)   . History of cardiac catheterization 2005, 2012   Both catheterizations for "anginal symptoms "showed angiographically normal coronary arteries. -->  Negative Myoview June 2016.  Coronary calcification noted on chest CT  . History of kidney stones   . Hypercholesteremia   . Hypertension   . Nummular eczema   . Skin cancer    "right abdomen"    PAST SURGICAL HISTORY   Past Surgical History:  Procedure Laterality Date  . ABDOMINAL HYSTERECTOMY  1980's  . APPENDECTOMY Right   . BACK SURGERY     x 2, lower  . BLADDER TUMOR EXCISION    . BREAST BIOPSY Right   . BREAST EXCISIONAL BIOPSY Right   . CAROTID ARTERY DOPPLERS   10/2017   Carotid Dopplers: 40-59% stenosis on the Right Internal Carotid, < 40% stenosis in the Left. Both vertebral arteries show normal flow. Left subclavian artery is normal, but there is some flow disturbance in the right subclavian artery.  . COLONOSCOPY WITH PROPOFOL N/A 02/26/2015   Procedure: COLONOSCOPY WITH PROPOFOL;  Surgeon: Carol Ada, MD;  Location: [facility];  Service: Endoscopy;  Laterality: N/A;  . COLONOSCOPY WITH PROPOFOL N/A 12/06/2017   Procedure: COLONOSCOPY WITH PROPOFOL;  Surgeon: Juanita Craver, MD;  Location: [facility];  Service: Endoscopy;  Laterality: N/A;  . CORONARY  CALCIUM SCORE & CT ANGIOGRAM  11/2017   Coronary calcium score 131 (moderate risk).  Moderate proximal LAD & LCx stenosis --> LOW RISK STUDY.  No evidence to suspect cardiac related chest pain --> correlates with cardiac catheterizations  . ENTEROSCOPY N/A 12/14/2017   Procedure: ENTEROSCOPY;  Surgeon: Carol Ada, MD;  Location: [facility];  Service: Endoscopy;  Laterality: N/A;  . ESOPHAGOGASTRODUODENOSCOPY Left 02/20/2015   Procedure: ESOPHAGOGASTRODUODENOSCOPY (EGD);  Surgeon: Milus Banister, MD;  Location: [facility];  Service: Endoscopy;  Laterality: Left;  . ESOPHAGOGASTRODUODENOSCOPY (EGD) WITH PROPOFOL N/A 12/06/2017   Procedure: ESOPHAGOGASTRODUODENOSCOPY (EGD) WITH PROPOFOL;  Surgeon: Juanita Craver, MD;  Location: [facility];  Service: Endoscopy;  Laterality: N/A;  . HOT HEMOSTASIS N/A 12/14/2017   Procedure: HOT HEMOSTASIS (ARGON PLASMA COAGULATION/BICAP);  Surgeon: Carol Ada, MD;  Location: [facility];  Service: Endoscopy;  Laterality: N/A;  . JOINT REPLACEMENT    . KNEE ARTHROSCOPY Right ~ 2000  . LEFT HEART CATH AND CORONARY ANGIOGRAPHY  11/'05; 2/'12   Angiographically normal coronary arteries  . LUMBAR DISC SURGERY  1980's X 1; ~ 2002  . MEDIAL PARTIAL KNEE REPLACEMENT Right 2001  . NM MYOVIEW LTD  03-28-15   EF 65%.  No reversible ischemia.  Possible scar along the cardiac apex and anteroseptal wall.  (Mild severity, medium-sized defect in the apex anteroseptal wall noted on both resting and stress images).  No inducible ischemia.  Read as LOW RISK  . PARTIAL KNEE ARTHROPLASTY Left 2009  . REPLACEMENT TOTAL KNEE BILATERAL Bilateral   . REVISION TOTAL KNEE ARTHROPLASTY Left   . SKIN CANCER EXCISION Right    "abdomen"  . TRANSTHORACIC ECHOCARDIOGRAM  06/'16; 2/'19    A) mod Conc LVH. EF 60-65%.  No RWMA. GR 1 D. Mild Ao Stenosis. Mild LA dilation.;  B) EF 60-65%.  Mild LVH.  No or W MA.  No significant valve disease.;l 01/2020: Normal LV systolic function.  Mild LVH.   GR 1 DD.  EF 60 to 65%.  No R WMA.  Trace AI.  No AS    MEDICATIONS/ALLERGIES   Current Meds  Medication Sig  . acetaminophen (TYLENOL) 500 MG tablet Take 1,000 mg by mouth 2 (two) times daily as needed for moderate pain or headache.   . diclofenac sodium (VOLTAREN) 1 % GEL Apply 2 g topically 4 (four) times daily.  . diphenhydrAMINE (ALLERGY RELIEF) 25 mg capsule Take 25 mg by mouth daily as needed for allergies.   Marland Kitchen FLUoxetine (PROZAC) 40 MG capsule Take 40 mg by mouth daily as needed (depression).  . furosemide (LASIX) 20 MG tablet Take 1 tablet (20 mg total) by mouth as needed. For shortness of breathe or edema( swelling)  . hydrocortisone cream 1 % Apply to affected area 2 times daily  . nystatin (NYSTATIN) powder Apply topically 4 (four) times daily.  . simvastatin (ZOCOR) 80 MG tablet Take 1 tablet (80 mg total) by mouth at bedtime.  . trandolapril (MAVIK) 2 MG tablet TAKE 1 TABLET(2 MG) BY MOUTH DAILY  . valACYclovir (VALTREX) 1000 MG tablet TAKE 1/2 TABLET BY MOUTH THREE TIMES DAILY AS NEEDED FOR OUTBREAK (Patient taking differently: Take 500 mg by mouth 3 (three) times daily as needed (viral out break). )    Allergies  Allergen Reactions  . Penicillins Shortness Of Breath, Itching, Swelling, Rash and Other (See Comments)    Has patient had a PCN reaction causing immediate rash, facial/tongue/throat swelling, SOB or lightheadedness with hypotension: Yes Has patient had a PCN reaction causing severe rash involving mucus membranes or skin necrosis: No Has patient had a PCN reaction that required hospitalization: Yes Has patient had a PCN reaction occurring within the last 10 years: No If all of the above answers are "NO", then may proceed with Cephalosporin use.   . Azithromycin Itching, Swelling and Rash  . Levaquin [Levofloxacin In D5w] Hives, Itching, Swelling and Rash  . Clindamycin/Lincomycin Hives  . Erythromycin Base Itching  . Keflex [Cephalexin] Itching and Rash  .  Sulfa Antibiotics Itching and Rash  . Tetracyclines & Related Itching and Rash    SOCIAL HISTORY/FAMILY HISTORY   Reviewed in Epic:  Pertinent findings: Her last living sister died earlier in 03/28/2023.  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  OBJCTIVE -PE, EKG, labs   Wt Readings from Last 3 Encounters:  03/17/20 205 lb 6.4 oz (93.2 kg)  02/12/20 200 lb (90.7 kg)  12/18/19 207 lb (93.9 kg)    Physical Exam: BP  122/72   Pulse 68   Ht 5\' 4"  (1.626 m)   Wt 205 lb 6.4 oz (93.2 kg)   SpO2 98%   BMI 35.26 kg/m  Physical Exam Vitals reviewed.  Constitutional:      General: She is not in acute distress.    Appearance: Normal appearance. She is obese. She is not ill-appearing.  HENT:     Head: Normocephalic and atraumatic.  Neck:     Vascular: No carotid bruit or JVD.  Cardiovascular:     Rate and Rhythm: Normal rate and regular rhythm.  No extrasystoles are present.    Chest Wall: PMI is not displaced.     Pulses: Normal pulses.     Heart sounds: Murmur (1/6 SEM at RUSB) heard.  No friction rub. No gallop.   Pulmonary:     Effort: Pulmonary effort is normal. No respiratory distress.     Breath sounds: Normal breath sounds. No wheezing.  Chest:     Chest wall: Tenderness (Mild costochondral tenderness) present.  Musculoskeletal:        General: Swelling (1+ bilateral LE) present. Normal range of motion.     Cervical back: Normal range of motion. No rigidity.  Neurological:     General: No focal deficit present.     Mental Status: She is alert and oriented to person, place, and time.  Psychiatric:        Mood and Affect: Mood normal.        Thought Content: Thought content normal.        Judgment: Judgment normal.     Adult ECG Report Not checked  Recent Labs:    Lab Results  Component Value Date   CHOL 165 07/01/2019   HDL 56 07/01/2019   LDLCALC 92 07/01/2019   TRIG 89  07/01/2019   CHOLHDL 2.9 07/01/2019   Lab Results  Component Value Date   CREATININE 0.98 02/12/2020   BUN 10 02/12/2020   NA 139 02/12/2020   K 4.2 02/12/2020   CL 104 02/12/2020   CO2 25 02/12/2020   Lab Results  Component Value Date   TSH 1.256 02/18/2015    ASSESSMENT/PLAN    Problem List Items Addressed This Visit    Chest pain with low risk for cardiac etiology (Chronic)   Systolic ejection murmur (Chronic)   Coronary artery calcification seen on CAT scan (Chronic)   Leg swelling   Palpitations - Primary   Relevant Orders   LONG TERM MONITOR (3-14 DAYS)     Peggy [name] is here now with symptoms of orthopnea PND and edema associated with palpitations.  I am concerned that she may have paroxysmal A. fib and this could be leading her to have some heart failure symptoms from diastolic dysfunction.  Her chest discomfort off and on is most likely not anginal in nature and that she has had a pretty reassuring nonischemic evaluation in the past.  She is on low-dose diuretic I did told her that she can take extra doses if necessary for worsening swelling or dyspnea.  Plan:  Evaluate for potential arrhythmia with 2-week Zio patch monitor.  We will see her back after the monitor is done.  If symptoms are still present for chest pain, and we can consider different options for ischemic evaluation.  Her coronary CTA just over a year and a half ago showed minimal disease. ->  Would probably have to consider Myoview if symptoms warrant.  Continue Lasix for edema.  Although she has  a systolic ejection murmur, it is likely related to aortic sclerosis but not stenosis.  Nothing significant noted on echo.   ------------------------------------------------  COVID-19 Education: The signs and symptoms of COVID-19 were discussed with the patient and how to seek care for testing (follow up with PCP or arrange E-visit).   The importance of social distancing and COVID-19 vaccination was  discussed today.  I spent a total of 32minutes with the patient. >  50% of the time was spent in direct patient consultation.  Additional time spent with chart review  / charting (studies, outside notes, etc): 10 Total Time: 32  min   Current medicines are reviewed at length with the patient today.  (+/- concerns) none  Notice: This dictation was prepared with Dragon dictation along with smaller phrase technology. Any transcriptional errors that result from this process are unintentional and may not be corrected upon review.  Patient Instructions / Medication Changes & Studies & Tests Ordered   Patient Instructions  Medication Instructions:  If you have any swelling you may take an  double your dose of Lasix  ( total  40 mg ) for that day   *If you need a refill on your cardiac medications before your next appointment, please call your pharmacy*   Lab Work: Not needed    Testing/Procedures: Your physician has recommended that you wear a   14  DAY ZIO-PATCH monitor. The Zio patch cardiac monitor continuously records heart rhythm data for up to 14 days, this is for patients being evaluated for multiple types heart rhythms. For the first 24 hours post application, please avoid getting the Zio monitor wet in the shower or by excessive sweating during exercise. After that, feel free to carry on with regular activities. Keep soaps and lotions away from the ZIO XT Patch.  This will be mailed to you, please expect 7-10 days to receive.    Applying the monitor   Shave hair from upper left chest.   Hold abrader disc by orange tab.  Rub abrader in 40 strokes over left upper chest as indicated in your monitor instructions.   Clean area with 4 enclosed alcohol pads .  Use all pads to assure are is cleaned thoroughly.  Let dry.   Apply patch as indicated in monitor instructions.  Patch will be place under collarbone on left side of chest with arrow pointing upward.   Rub patch adhesive wings  for 2 minutes.Remove white label marked "1".  Remove white label marked "2".  Rub patch adhesive wings for 2 additional minutes.   While looking in a mirror, press and release button in center of patch.  A small green light will flash 3-4 times .  This will be your only indicator the monitor has been turned on.     Do not shower for the first 24 hours.  You may shower after the first 24 hours.   Press button if you feel a symptom. You will hear a small click.  Record Date, Time and Symptom in the Patient Log Book.   When you are ready to remove patch, follow instructions on last 2 pages of Patient Log Book.  Stick patch monitor onto last page of Patient Log Book.   Place Patient Log Book in [place] box.  Use locking tab on box and tape box closed securely.  The [employer] has [employer] on it.  Please place in mailbox as soon as possible.  Your physician should have your  test results approximately 7 days after the monitor has been mailed back to [facility]   Call [place] at [phone] if you have questions regarding your ZIO XT patch monitor.  Call them immediately if you see an orange light blinking on your monitor.   If your monitor falls off in less than 4 days contact our Monitor department at [phone].  If your monitor becomes loose or falls off after 4 days call [employer] at [phone] for suggestions on securing your monitor    Follow-Up: At [facility], you and your health needs are our priority.  As part of our continuing mission to provide you with exceptional heart care, we have created designated Provider Care Teams.  These Care Teams include your primary Cardiologist (physician) and Advanced Practice Providers (APPs -  Physician Assistants and Nurse Practitioners) who all work together to provide you with the care you need, when you need it.  We recommend signing up for the patient portal called "MyChart".  Sign up information is  provided on this After Visit Summary.  MyChart is used to connect with patients for Virtual Visits (Telemedicine).  Patients are able to view lab/test results, encounter notes, upcoming appointments, etc.  Non-urgent messages can be sent to your provider as well.   To learn more about what you can do with MyChart, go to [email or web address].    Your next appointment:   6 week(s)  The format for your next appointment:   In Person  Provider:   Glenetta Hew, MD   Other Instructions Not needed    ZIO XT- Long Term Monitor Instructions   Your physician has requested you wear your ZIO patch monitor____14___days.   This is a single patch monitor.  [employer] supplies one patch monitor per enrollment.  Additional stickers are not available.   Please do not apply patch if you will be having a Nuclear Stress Test, Echocardiogram, Cardiac CT, MRI, or Chest Xray during the time frame you would be wearing the monitor. The patch cannot be worn during these tests.  You cannot remove and re-apply the ZIO XT patch monitor.   Your ZIO patch monitor will be sent USPS Priority mail from [facility] directly to your home address. The monitor may also be mailed to a PO BOX if home delivery is not available.   It may take 3-5 days to receive your monitor after you have been enrolled.   Once you have received you monitor, please review enclosed instructions.  Your monitor has already been registered assigning a specific monitor serial # to you.   Applying the monitor   Shave hair from upper left chest.   Hold abrader disc by orange tab.  Rub abrader in 40 strokes over left upper chest as indicated in your monitor instructions.   Clean area with 4 enclosed alcohol pads .  Use all pads to assure are is cleaned thoroughly.  Let dry.   Apply patch as indicated in monitor instructions.  Patch will be place under collarbone on left side of chest with arrow pointing upward.   Rub patch adhesive  wings for 2 minutes.Remove white label marked "1".  Remove white label marked "2".  Rub patch adhesive wings for 2 additional minutes.   While looking in a mirror, press and release button in center of patch.  A small green light will flash 3-4 times .  This will be your only indicator the monitor has been turned on.     Do not  shower for the first 24 hours.  You may shower after the first 24 hours.   Press button if you feel a symptom. You will hear a small click.  Record Date, Time and Symptom in the Patient Log Book.   When you are ready to remove patch, follow instructions on last 2 pages of Patient Log Book.  Stick patch monitor onto last page of Patient Log Book.   Place Patient Log Book in [place] box.  Use locking tab on box and tape box closed securely.  The [employer] has [employer] on it.  Please place in mailbox as soon as possible.  Your physician should have your test results approximately 7 days after the monitor has been mailed back to [facility].   Call [place] at [phone] if you have questions regarding your ZIO XT patch monitor.  Call them immediately if you see an orange light blinking on your monitor.   If your monitor falls off in less than 4 days contact our Monitor department at [phone].  If your monitor becomes loose or falls off after 4 days call [employer] at [phone] for suggestions on securing your monitor.       Studies Ordered:   Orders Placed This Encounter  Procedures  . LONG TERM MONITOR (3-14 DAYS)     Glenetta Hew, M.D., M.S. Interventional Cardiologist   Pager # [phone] Phone # [phone] [address] [place], [place] [address]   Thank you for choosing Heartcare at [facility]!!

## 2020-03-20 ENCOUNTER — Encounter: Payer: Self-pay | Admitting: Cardiology

## 2020-03-22 ENCOUNTER — Ambulatory Visit (INDEPENDENT_AMBULATORY_CARE_PROVIDER_SITE_OTHER): Payer: Medicare HMO

## 2020-03-22 DIAGNOSIS — R002 Palpitations: Secondary | ICD-10-CM | POA: Diagnosis not present

## 2020-03-25 DIAGNOSIS — F419 Anxiety disorder, unspecified: Secondary | ICD-10-CM | POA: Diagnosis not present

## 2020-03-31 ENCOUNTER — Other Ambulatory Visit: Payer: Self-pay

## 2020-03-31 ENCOUNTER — Encounter: Payer: Self-pay | Admitting: Nurse Practitioner

## 2020-03-31 ENCOUNTER — Ambulatory Visit (INDEPENDENT_AMBULATORY_CARE_PROVIDER_SITE_OTHER): Payer: Medicare HMO | Admitting: Nurse Practitioner

## 2020-03-31 VITALS — BP 142/70 | HR 97 | Temp 98.7°F | Ht 62.0 in | Wt 206.2 lb

## 2020-03-31 DIAGNOSIS — E559 Vitamin D deficiency, unspecified: Secondary | ICD-10-CM

## 2020-03-31 DIAGNOSIS — Z0000 Encounter for general adult medical examination without abnormal findings: Secondary | ICD-10-CM | POA: Diagnosis not present

## 2020-03-31 DIAGNOSIS — Z6837 Body mass index (BMI) 37.0-37.9, adult: Secondary | ICD-10-CM

## 2020-03-31 DIAGNOSIS — M25561 Pain in right knee: Secondary | ICD-10-CM

## 2020-03-31 DIAGNOSIS — I10 Essential (primary) hypertension: Secondary | ICD-10-CM | POA: Diagnosis not present

## 2020-03-31 DIAGNOSIS — M25562 Pain in left knee: Secondary | ICD-10-CM | POA: Diagnosis not present

## 2020-03-31 DIAGNOSIS — G8929 Other chronic pain: Secondary | ICD-10-CM

## 2020-03-31 DIAGNOSIS — E782 Mixed hyperlipidemia: Secondary | ICD-10-CM

## 2020-03-31 LAB — POCT URINALYSIS DIPSTICK
Bilirubin, UA: NEGATIVE
Blood, UA: NEGATIVE
Glucose, UA: NEGATIVE
Ketones, UA: NEGATIVE
Nitrite, UA: NEGATIVE
Protein, UA: NEGATIVE
Spec Grav, UA: 1.02 (ref 1.010–1.025)
Urobilinogen, UA: 0.2 E.U./dL
pH, UA: 5.5 (ref 5.0–8.0)

## 2020-03-31 LAB — POCT UA - MICROALBUMIN
Albumin/Creatinine Ratio, Urine, POC: 30
Creatinine, POC: 300 mg/dL
Microalbumin Ur, POC: 10 mg/L

## 2020-03-31 MED ORDER — TRANDOLAPRIL 2 MG PO TABS: 2.0000 mg | ORAL_TABLET | Freq: Every day | ORAL | 1 refills | Status: DC

## 2020-03-31 MED ORDER — OXYCODONE-ACETAMINOPHEN 10-325 MG PO TABS: 1.0000 | ORAL_TABLET | Freq: Four times a day (QID) | ORAL | 0 refills | Status: DC | PRN

## 2020-03-31 MED ORDER — SIMVASTATIN 80 MG PO TABS: 80.0000 mg | ORAL_TABLET | Freq: Every day | ORAL | 1 refills | Status: DC

## 2020-03-31 NOTE — Patient Instructions (Signed)
Health Maintenance After Age 76 After age 76, you are at a higher risk for certain long-term diseases and infections as well as injuries from falls. Falls are a major cause of broken bones and head injuries in people who are older than age 76. Getting regular preventive care can help to keep you healthy and well. Preventive care includes getting regular testing and making lifestyle changes as recommended by your health care provider. Talk with your health care provider about:  Which screenings and tests you should have. A screening is a test that checks for a disease when you have no symptoms.  A diet and exercise plan that is right for you. What should I know about screenings and tests to prevent falls? Screening and testing are the best ways to find a health problem early. Early diagnosis and treatment give you the best chance of managing medical conditions that are common after age 76. Certain conditions and lifestyle choices may make you more likely to have a fall. Your health care provider may recommend:  Regular vision checks. Poor vision and conditions such as cataracts can make you more likely to have a fall. If you wear glasses, make sure to get your prescription updated if your vision changes.  Medicine review. Work with your health care provider to regularly review all of the medicines you are taking, including over-the-counter medicines. Ask your health care provider about any side effects that may make you more likely to have a fall. Tell your health care provider if any medicines that you take make you feel dizzy or sleepy.  Osteoporosis screening. Osteoporosis is a condition that causes the bones to get weaker. This can make the bones weak and cause them to break more easily.  Blood pressure screening. Blood pressure changes and medicines to control blood pressure can make you feel dizzy.  Strength and balance checks. Your health care provider may recommend certain tests to check your  strength and balance while standing, walking, or changing positions.  Foot health exam. Foot pain and numbness, as well as not wearing proper footwear, can make you more likely to have a fall.  Depression screening. You may be more likely to have a fall if you have a fear of falling, feel emotionally low, or feel unable to do activities that you used to do.  Alcohol use screening. Using too much alcohol can affect your balance and may make you more likely to have a fall. What actions can I take to lower my risk of falls? General instructions  Talk with your health care provider about your risks for falling. Tell your health care provider if: ? You fall. Be sure to tell your health care provider about all falls, even ones that seem minor. ? You feel dizzy, sleepy, or off-balance.  Take over-the-counter and prescription medicines only as told by your health care provider. These include any supplements.  Eat a healthy diet and maintain a healthy weight. A healthy diet includes low-fat dairy products, low-fat (lean) meats, and fiber from whole grains, beans, and lots of fruits and vegetables. Home safety  Remove any tripping hazards, such as rugs, cords, and clutter.  Install safety equipment such as grab bars in bathrooms and safety rails on stairs.  Keep rooms and walkways well-lit. Activity   Follow a regular exercise program to stay fit. This will help you maintain your balance. Ask your health care provider what types of exercise are appropriate for you.  If you need a cane or   walker, use it as recommended by your health care provider.  Wear supportive shoes that have nonskid soles. Lifestyle  Do not drink alcohol if your health care provider tells you not to drink.  If you drink alcohol, limit how much you have: ? 0-1 drink a day for women. ? 0-2 drinks a day for men.  Be aware of how much alcohol is in your drink. In the [place] one drink equals one typical bottle of beer (12  oz), one-half glass of wine (5 oz), or one shot of hard liquor (1 oz).  Do not use any products that contain nicotine or tobacco, such as cigarettes and e-cigarettes. If you need help quitting, ask your health care provider. Summary  Having a healthy lifestyle and getting preventive care can help to protect your health and wellness after age 76.  Screening and testing are the best way to find a health problem early and help you avoid having a fall. Early diagnosis and treatment give you the best chance for managing medical conditions that are more common for people who are older than age 76.  Falls are a major cause of broken bones and head injuries in people who are older than age 76. Take precautions to prevent a fall at home.  Work with your health care provider to learn what changes you can make to improve your health and wellness and to prevent falls. This information is not intended to replace advice given to you by your health care provider. Make sure you discuss any questions you have with your health care provider. Document Revised: 12/12/2018 Document Reviewed: 07/04/2017 Elsevier Patient Education  2020 [employer]  

## 2020-03-31 NOTE — Progress Notes (Signed)
This visit occurred during the SARS-CoV-2 public health emergency.  Safety protocols were in place, including screening questions prior to the visit, additional usage of staff PPE, and extensive cleaning of exam room while observing appropriate contact time as indicated for disinfecting solutions.  Subjective:     Patient ID: Peggy [name] , female    DOB: Jan 19, 1946 , 74 y.o.   MRN: [id number]   Chief Complaint  Patient presents with  . Annual Exam    HPI  Here for HM, does not want to take her pants off.    Wt Readings from Last 3 Encounters: 03/31/20 : (!) 206 lb 3.2 oz (93.5 kg) 03/17/20 : 205 lb 6.4 oz (93.2 kg) 02/12/20 : 200 lb (90.7 kg)  She is currently wearing a zio monitor that Dr. Herbie Baltimore has applied. She is to follow up with him in august   Hypertension This is a chronic problem. The current episode started more than 1 year ago. The problem is unchanged. The problem is controlled. Pertinent negatives include no anxiety, chest pain, malaise/fatigue, palpitations or shortness of breath. There are no associated agents to hypertension. Risk factors for coronary artery disease include obesity, sedentary lifestyle and post-menopausal state. Past treatments include ACE inhibitors and diuretics. There are no compliance problems.  There is no history of angina. There is no history of chronic renal disease.     Past Medical History:  Diagnosis Date  . Anxiety   . Arthritis    "all over"  . Asthma   . Bladder cancer (HCC)   . Chronic bronchitis (HCC)    'get it q yr"  . Chronic lower back pain   . Daily headache   . Deafness in left ear   . Depression   . DVT (deep venous thrombosis) (HCC) 2009/2010   "probably left leg"   . GERD (gastroesophageal reflux disease)   . History of cardiac catheterization 2005, 2012   Both catheterizations for "anginal symptoms "showed angiographically normal coronary arteries. -->  Negative Myoview June 2016.  Coronary calcification noted  on chest CT  . History of kidney stones   . Hypercholesteremia   . Hypertension   . Nummular eczema   . Skin cancer    "right abdomen"     Family History  Problem Relation Age of Onset  . Heart failure Mother   . Cancer Mother   . Hypertension Mother   . Hypertension Father   . Heart attack Father        Noted on cardiology intake form as "massive heart attack" at age 43  . Heart failure Sister   . Heart attack Sister        Not listed on her cardiology intake form  . Breast cancer Sister   . Heart failure Sister   . Heart attack Sister        Not listed on her cardiology intake  . Cancer Sister   . Breast cancer Sister      Current Outpatient Medications:  .  acetaminophen (TYLENOL) 500 MG tablet, Take 1,000 mg by mouth 2 (two) times daily as needed for moderate pain or headache. , Disp: , Rfl:  .  diclofenac sodium (VOLTAREN) 1 % GEL, Apply 2 g topically 4 (four) times daily., Disp: 100 g, Rfl: 2 .  diphenhydrAMINE (ALLERGY RELIEF) 25 mg capsule, Take 25 mg by mouth daily as needed for allergies. , Disp: , Rfl:  .  FLUoxetine (PROZAC) 40 MG capsule, Take 40 mg by  mouth daily as needed (depression)., Disp: , Rfl:  .  furosemide (LASIX) 20 MG tablet, Take 1 tablet (20 mg total) by mouth as needed. For shortness of breathe or edema( swelling), Disp: 15 tablet, Rfl: 11 .  hydrocortisone cream 1 %, Apply to affected area 2 times daily, Disp: 30 g, Rfl: 1 .  nystatin (NYSTATIN) powder, Apply topically 4 (four) times daily., Disp: 15 g, Rfl: 0 .  simvastatin (ZOCOR) 80 MG tablet, Take 1 tablet (80 mg total) by mouth at bedtime., Disp: 90 tablet, Rfl: 1 .  trandolapril (MAVIK) 2 MG tablet, Take 1 tablet (2 mg total) by mouth daily., Disp: 90 tablet, Rfl: 1 .  valACYclovir (VALTREX) 1000 MG tablet, TAKE 1/2 TABLET BY MOUTH THREE TIMES DAILY AS NEEDED FOR OUTBREAK (Patient taking differently: Take 500 mg by mouth 3 (three) times daily as needed (viral out break). ), Disp: 315 tablet,  Rfl: 0 .  oxyCODONE-acetaminophen (PERCOCET) 10-325 MG tablet, Take 1 tablet by mouth every 6 (six) hours as needed for pain., Disp: 20 tablet, Rfl: 0   Allergies  Allergen Reactions  . Penicillins Shortness Of Breath, Itching, Swelling, Rash and Other (See Comments)    Has patient had a PCN reaction causing immediate rash, facial/tongue/throat swelling, SOB or lightheadedness with hypotension: Yes Has patient had a PCN reaction causing severe rash involving mucus membranes or skin necrosis: No Has patient had a PCN reaction that required hospitalization: Yes Has patient had a PCN reaction occurring within the last 10 years: No If all of the above answers are "NO", then may proceed with Cephalosporin use.   . Azithromycin Itching, Swelling and Rash  . Levaquin [Levofloxacin In D5w] Hives, Itching, Swelling and Rash  . Clindamycin/Lincomycin Hives  . Erythromycin Base Itching  . Keflex [Cephalexin] Itching and Rash  . Sulfa Antibiotics Itching and Rash  . Tetracyclines & Related Itching and Rash      The is status post hysterectomy.  Negative for: breast discharge, breast lump(s), breast pain and breast self exam. Associated symptoms include abnormal vaginal bleeding. Pertinent negatives include abnormal bleeding (hematology), anxiety, decreased libido, depression, difficulty falling sleep, dyspareunia, history of infertility, nocturia, sexual dysfunction, sleep disturbances, urinary incontinence, urinary urgency, vaginal discharge and vaginal itching. Diet regular. The patient states her exercise level is minimal due to having bilateral knee pain.  She has not been to the orthopedic.  The patient's tobacco use is:  Social History   Tobacco Use  Smoking Status Never Smoker  Smokeless Tobacco Never Used   She has been exposed to passive smoke. The patient's alcohol use is:  Social History   Substance and Sexual Activity  Alcohol Use No    Review of Systems  Constitutional:  Negative.  Negative for malaise/fatigue.  HENT: Negative.   Eyes: Negative.   Respiratory: Negative.  Negative for shortness of breath.   Cardiovascular: Positive for leg swelling (bilateral knee swelling). Negative for chest pain and palpitations.  Gastrointestinal: Negative.   Endocrine: Negative.   Genitourinary: Negative.   Musculoskeletal: Positive for arthralgias (left knee pain - chronic - requesting a referral to [place]).  Skin: Negative.   Allergic/Immunologic: Negative.   Neurological: Negative.   Hematological: Negative.   Psychiatric/Behavioral: Negative.      Today's Vitals   03/31/20 0951  BP: (!) 142/70  Pulse: 97  Temp: 98.7 F (37.1 C)  TempSrc: Oral  Weight: (!) 206 lb 3.2 oz (93.5 kg)  Height: '5\' 2"'$  (1.575 m)  PainSc:  5   PainLoc: Leg   Body mass index is 37.71 kg/m.   Objective:  Physical Exam Constitutional:      General: She is not in acute distress.    Appearance: Normal appearance. She is well-developed. She is obese.  HENT:     Head: Normocephalic and atraumatic.     Right Ear: Hearing, tympanic membrane, ear canal and external ear normal. There is no impacted cerumen.     Left Ear: Hearing, tympanic membrane, ear canal and external ear normal. There is no impacted cerumen.     Nose:     Comments: Deferred - masked    Mouth/Throat:     Comments: Deferred - masked Eyes:     General: Lids are normal.     Extraocular Movements: Extraocular movements intact.     Conjunctiva/sclera: Conjunctivae normal.     Pupils: Pupils are equal, round, and reactive to light.     Funduscopic exam:    Right eye: No papilledema.        Left eye: No papilledema.  Neck:     Thyroid: No thyroid mass.     Vascular: No carotid bruit.  Cardiovascular:     Rate and Rhythm: Normal rate and regular rhythm.     Pulses: Normal pulses.     Heart sounds: Normal heart sounds. No murmur heard.   Pulmonary:     Effort: Pulmonary effort is  normal. No respiratory distress.     Breath sounds: Normal breath sounds. No wheezing.  Abdominal:     General: Abdomen is flat. Bowel sounds are normal. There is no distension.     Palpations: Abdomen is soft.     Tenderness: There is no abdominal tenderness.  Genitourinary:    Rectum: Guaiac result negative.  Musculoskeletal:        General: Swelling (bilateral knees are swelling) and tenderness (left knee) present. Normal range of motion.     Cervical back: Full passive range of motion without pain, normal range of motion and neck supple.     Right lower leg: Edema (nonpitting) present.     Left lower leg: Edema (nonpitting) present.  Skin:    General: Skin is warm and dry.     Capillary Refill: Capillary refill takes less than 2 seconds.  Neurological:     General: No focal deficit present.     Mental Status: She is alert and oriented to person, place, and time.     Cranial Nerves: No cranial nerve deficit.     Sensory: No sensory deficit.  Psychiatric:        Mood and Affect: Mood normal.        Behavior: Behavior normal.        Thought Content: Thought content normal.        Judgment: Judgment normal.         Assessment And Plan:     1. Encounter for general adult medical examination w/o abnormal findings . Behavior modifications discussed and diet history reviewed.   . Pt will continue to exercise regularly and modify diet with low GI, plant based foods and decrease intake of processed foods.  . Recommend intake of daily multivitamin, Vitamin D, and calcium.  . Recommend for preventive screenings, as well as recommend immunizations that include influenza, TDAP (up to date) - CBC  2. Class 2 severe obesity due to excess calories with serious comorbidity and body mass index (BMI) of 37.0 to 37.9 in adult [facility])  Chronic  Discussed  healthy diet and regular exercise options   Encouraged to exercise at least 150 minutes per week with 2 days of strength training with  chair exercises due to bilateral knee pain  3. Mixed hyperlipidemia  Chronic, controlled  Continue with current medications - Lipid panel  4. Essential hypertension . B/P is controlled.  . CMP ordered to check renal function.  . The importance of regular exercise and dietary modification was stressed to the patient.  . She is currently being followed by a Cardiologist for chest discomfort.  - POCT Urinalysis Dipstick (81002) - POCT UA - Microalbumin - CMP14+EGFR  5. Vitamin D deficiency  Will check vitamin D level and supplement as needed.     Also encouraged to spend 15 minutes in the sun daily.  - VITAMIN D 25 Hydroxy (Vit-D Deficiency, Fractures)  6. Chronic pain of both knees Comments: continued pain to knees with left worse than right limited amount of pain medication will refer to [place]  - Ambulatory referral to Orthopedic Surgery     Patient was given opportunity to ask questions. Patient verbalized understanding of the plan and was able to repeat key elements of the plan. All questions were answered to their satisfaction.   [name], FNP   I, [name], FNP, have reviewed all documentation for this visit. The documentation on 03/31/20 for the exam, diagnosis, procedures, and orders are all accurate and complete.  THE PATIENT IS ENCOURAGED TO PRACTICE SOCIAL DISTANCING DUE TO THE COVID-19 PANDEMIC.

## 2020-04-01 LAB — CMP14+EGFR
ALT: 12 IU/L (ref 0–32)
AST: 20 IU/L (ref 0–40)
Albumin/Globulin Ratio: 1.3 (ref 1.2–2.2)
Albumin: 4.4 g/dL (ref 3.7–4.7)
Alkaline Phosphatase: 148 IU/L — ABNORMAL HIGH (ref 48–121)
BUN/Creatinine Ratio: 11 — ABNORMAL LOW (ref 12–28)
BUN: 11 mg/dL (ref 8–27)
Bilirubin Total: 0.5 mg/dL (ref 0.0–1.2)
CO2: 24 mmol/L (ref 20–29)
Calcium: 9.5 mg/dL (ref 8.7–10.3)
Chloride: 104 mmol/L (ref 96–106)
Creatinine, Ser: 1.01 mg/dL — ABNORMAL HIGH (ref 0.57–1.00)
GFR calc Af Amer: 62 mL/min/{1.73_m2} (ref >59)
GFR calc non Af Amer: 54 mL/min/{1.73_m2} — ABNORMAL LOW (ref >59)
Globulin, Total: 3.3 g/dL (ref 1.5–4.5)
Glucose: 82 mg/dL (ref 65–99)
Potassium: 4.5 mmol/L (ref 3.5–5.2)
Sodium: 144 mmol/L (ref 134–144)
Total Protein: 7.7 g/dL (ref 6.0–8.5)

## 2020-04-01 LAB — CBC
Hematocrit: 35.9 % (ref 34.0–46.6)
Hemoglobin: 11.1 g/dL (ref 11.1–15.9)
MCH: 28.4 pg (ref 26.6–33.0)
MCHC: 30.9 g/dL — ABNORMAL LOW (ref 31.5–35.7)
MCV: 92 fL (ref 79–97)
Platelets: 385 10*3/uL (ref 150–450)
RBC: 3.91 x10E6/uL (ref 3.77–5.28)
RDW: 14.1 % (ref 11.7–15.4)
WBC: 8.7 10*3/uL (ref 3.4–10.8)

## 2020-04-01 LAB — VITAMIN D 25 HYDROXY (VIT D DEFICIENCY, FRACTURES): Vit D, 25-Hydroxy: 20.1 ng/mL — ABNORMAL LOW (ref 30.0–100.0)

## 2020-04-01 LAB — LIPID PANEL
Chol/HDL Ratio: 4.4 ratio (ref 0.0–4.4)
Cholesterol, Total: 274 mg/dL — ABNORMAL HIGH (ref 100–199)
HDL: 62 mg/dL (ref >39)
LDL Chol Calc (NIH): 187 mg/dL — ABNORMAL HIGH (ref 0–99)
Triglycerides: 139 mg/dL (ref 0–149)
VLDL Cholesterol Cal: 25 mg/dL (ref 5–40)

## 2020-04-06 ENCOUNTER — Telehealth: Payer: Self-pay

## 2020-04-06 NOTE — Telephone Encounter (Signed)
lvm for pt to return call for lab results  

## 2020-04-06 NOTE — Telephone Encounter (Signed)
-----   Message from [name], Clarissa sent at 04/01/2020  6:35 PM EDT ----- Your kidney functions are declined, make sure you are staying well hydrated with water. Liver enzymes are slightly up avoid ibuprofen/aleve and avoid processed foods. Blood levels are normal. Vitamin d is down to 20.1 are you taking a vitamin d supplement?  Your cholesterol levels have increased significantly with the total at 274 goal is less than 199 LDL is 187 goal is less than 99 are you eating more fried and fatty foods? Also make sure you are taking your simvastatin daily.

## 2020-04-08 ENCOUNTER — Telehealth: Payer: Self-pay

## 2020-04-08 NOTE — Telephone Encounter (Signed)
We received a PA for pt oxycodone/acetaminophen I called pt to see if she was able to pick up med and she stated she did. YL,RMA

## 2020-04-15 ENCOUNTER — Other Ambulatory Visit: Payer: Self-pay | Admitting: Nurse Practitioner

## 2020-04-16 DIAGNOSIS — R002 Palpitations: Secondary | ICD-10-CM | POA: Diagnosis not present

## 2020-04-20 DIAGNOSIS — T84018A Broken internal joint prosthesis, other site, initial encounter: Secondary | ICD-10-CM | POA: Diagnosis not present

## 2020-04-20 DIAGNOSIS — G8929 Other chronic pain: Secondary | ICD-10-CM | POA: Diagnosis not present

## 2020-04-20 DIAGNOSIS — Z96659 Presence of unspecified artificial knee joint: Secondary | ICD-10-CM | POA: Diagnosis not present

## 2020-04-20 DIAGNOSIS — M25562 Pain in left knee: Secondary | ICD-10-CM | POA: Diagnosis not present

## 2020-04-20 DIAGNOSIS — M25561 Pain in right knee: Secondary | ICD-10-CM | POA: Diagnosis not present

## 2020-04-26 DIAGNOSIS — T8484XD Pain due to internal orthopedic prosthetic devices, implants and grafts, subsequent encounter: Secondary | ICD-10-CM | POA: Diagnosis not present

## 2020-04-26 DIAGNOSIS — Z96653 Presence of artificial knee joint, bilateral: Secondary | ICD-10-CM | POA: Diagnosis not present

## 2020-04-29 ENCOUNTER — Ambulatory Visit (INDEPENDENT_AMBULATORY_CARE_PROVIDER_SITE_OTHER): Payer: Medicare HMO | Admitting: Cardiology

## 2020-04-29 ENCOUNTER — Other Ambulatory Visit: Payer: Self-pay

## 2020-04-29 ENCOUNTER — Encounter: Payer: Self-pay | Admitting: Cardiology

## 2020-04-29 VITALS — BP 132/64 | HR 97 | Ht 65.0 in | Wt 202.0 lb

## 2020-04-29 DIAGNOSIS — E782 Mixed hyperlipidemia: Secondary | ICD-10-CM

## 2020-04-29 DIAGNOSIS — I471 Supraventricular tachycardia: Secondary | ICD-10-CM | POA: Diagnosis not present

## 2020-04-29 DIAGNOSIS — I10 Essential (primary) hypertension: Secondary | ICD-10-CM

## 2020-04-29 DIAGNOSIS — I2510 Atherosclerotic heart disease of native coronary artery without angina pectoris: Secondary | ICD-10-CM

## 2020-04-29 MED ORDER — METOPROLOL SUCCINATE ER 25 MG PO TB24
25.0000 mg | ORAL_TABLET | Freq: Every day | ORAL | 3 refills | Status: DC
Start: 2020-04-29 — End: 2021-05-06

## 2020-04-29 MED ORDER — FUROSEMIDE 20 MG PO TABS: 20.0000 mg | ORAL_TABLET | ORAL | 11 refills | Status: DC | PRN

## 2020-04-29 NOTE — Patient Instructions (Addendum)
Medication Instructions:\   START TAKING METOPROLOL SUCCINATE 25 MG  ONE TABLET BY MOUTH DAILY   Recommendations for vagal maneuvers:  "Bearing down"  Coughing  Gagging  Icy, cold towel on face or drink ice cold water   *If you need a refill on your cardiac medications before your next appointment, please call your pharmacy*   Lab Work: NOT  NEEDED If you have labs (blood work) drawn today and your tests are completely normal, you will receive your results only by: Marland Kitchen MyChart Message (if you have MyChart) OR . A paper copy in the mail If you have any lab test that is abnormal or we need to change your treatment, we will call you to review the results.   Testing/Procedures: NOT NEEDED   Follow-Up: At [facility], you and your health needs are our priority.  As part of our continuing mission to provide you with exceptional heart care, we have created designated Provider Care Teams.  These Care Teams include your primary Cardiologist (physician) and Advanced Practice Providers (APPs -  Physician Assistants and Nurse Practitioners) who all work together to provide you with the care you need, when you need it.  We recommend signing up for the patient portal called "MyChart".  Sign up information is provided on this After Visit Summary.  MyChart is used to connect with patients for Virtual Visits (Telemedicine).  Patients are able to view lab/test results, encounter notes, upcoming appointments, etc.  Non-urgent messages can be sent to your provider as well.   To learn more about what you can do with MyChart, go to [email or web address].    Your next appointment:   3 TO 4  month(s)  The format for your next appointment:   Virtual Visit   Provider:   Glenetta Hew, MD   Addendum:  DC simvastatin 80 mg completion of current bottle  Change to the rosuvastatin 40 mg daily plus Zetia 10 mg daily  New Rx: Rosuvastatin 40 mg p.o. daily; dispense #90 tablets, 3  refills Ezetimibe 10 mg p.o. daily; dispense #90 tabs, 3 refills

## 2020-04-29 NOTE — Progress Notes (Signed)
Primary Care Provider: [name], FNP Cardiologist: Glenetta Hew, MD Electrophysiologist: None  Clinic Note: Chief Complaint  Patient presents with  . Follow-up    Event monitor results  . Palpitations    Associated with chest discomfort and dyspnea.    HPI:    Peggy [name] is a 74 y.o. female with a PMH notable for coronary calcification/nonobstructive CAD, HTN, hyperlipidemia and family history of CAD who presents today for 6-week follow-up to discuss results of monitor, and reassess chest discomfort symptoms.Marland Kitchen  PMH includes: Hypertension hyperlipidemia family history of CAD -> she does have noted coronary artery calcification on CT scan but has had negative ischemic evaluations in the past...  February 2019-Coronary Calcium Score & CTA: Coronary calcium score 131 (moderate risk).  Moderate proximal LAD & LCx stenosis --> LOW RISK STUDY.  No evidence to suspect cardiac related chest pain  Peggy [name] was last seen on December 18, 2019 by Coletta Memos for evaluation of chest pain and dizziness -> chest pain was reproducible on physical exam.  Patient was reassured.  Repeat echocardiogram ordered to evaluate murmur  Recent Hospitalizations:   February 12, 2020--[facility], ER: Bilateral leg swelling and shortness of breath.  D-dimer 1.73.  Troponin negative.  Lower extremity venous Dopplers--no DVT.  No cystic structure in popliteal fossa.  CTA chest: Limited study without obvious evidence of pulmonary embolism through the proximal segmental pulmonary arterial level.  Aortic and coronary artery atherosclerosis noted.  Reviewed  CV studies:    The following studies were reviewed today: (if available, images/films reviewed: From Epic Chart or Care Everywhere) . TTE 01/06/2020: Normal LV systolic function.  Mild LVH.  GR 1 DD.  EF 60 to 65%.  No R WMA.  Trace AI.  No AS . 2-week Zio patch Cardiac Event Monitor:   Sinus rhythm: Minimum heart rate 44 bpm, maximum 131 bpm, average  85 bpm.  Multiple (245) short runs of SVT/PAT: Fastest-4 beats max rate 231 bpm, longest lasted 18 seconds-average 134 bpm.  Occasional isolated (1.8%) PACs noted with rare couplets and triplets.  No prolonged runs of SVT, atrial tachycardia, or atrial fibrillation/flutter.  Rare PVCs with couplets and minimal triplets.  Interval History:   Peggy [name] returns here today to discuss results of her monitor and reassess chest pain.  She is describing very short little spells of chest discomfort which are probably consistent with her little bursts of SVT or PAT.  The little nagging sensations he is feeling are not associated any particular activity.  They are there at rest or when she is may be doing her dishes.  But not associated with physical activity.  In fact they seem to improve with physical activity.  The spells do not last long-may be 20 to 30 seconds.  She has some mild orthopnea symptoms but no PND.  She started to think of the swelling that she has is, more related to her knees hurting.  She is taking her Lasix every day and may be once or twice a week taking as needed additional doses.  Cardiovascular Review of Symptoms (Summary) : positive for - edema, irregular heartbeat, orthopnea, palpitations, rapid heart rate and Chest fullness seems to be going along with her spells. negative for - chest pain, paroxysmal nocturnal dyspnea, shortness of breath or Not noting dizziness or lightheadedness with these short bursts of tachycardia.  No syncope or near syncope, TIA or amaurosis fugax.  The patient DOES NOT have symptoms concerning for COVID-19  infection (fever, chills, cough, or new shortness of breath).  The patient is practicing social distancing & Masking.  Immunization History  Administered Date(s) Administered  . Influenza, High Dose Seasonal PF 07/01/2019  . Influenza-Unspecified 05/13/2018  . PFIZER SARS-COV-2 Vaccination 09/23/2019, 10/24/2019  . Pneumococcal Conjugate-13  09/03/2018  . Tdap 11/16/2017  . Zoster Recombinat (Shingrix) 09/04/2016, 02/02/2017    REVIEWED OF SYSTEMS   Review of Systems  Constitutional: Positive for malaise/fatigue (A little tired but okay). Negative for weight loss.  HENT: Negative for nosebleeds.   Respiratory: Positive for shortness of breath (Noted in HPI).   Cardiovascular:       Per HPI  Gastrointestinal: Negative for blood in stool and melena.  Genitourinary: Negative for hematuria.  Musculoskeletal: Positive for joint pain (Both knees hurt.  One worse than the other.).  Neurological: Positive for dizziness. Negative for focal weakness, weakness and headaches.  Psychiatric/Behavioral: Negative for memory loss. The patient is nervous/anxious. The patient does not have insomnia.        Grieving from her sister's death-improving   I have reviewed and (if needed) personally updated the patient's problem list, medications, allergies, past medical and surgical history, social and family history.   PAST MEDICAL HISTORY   Past Medical History:  Diagnosis Date  . Anxiety   . Arthritis    "all over"  . Asthma   . Bladder cancer ([place])   . Chronic bronchitis ([place])    'get it q yr"  . Chronic lower back pain   . Daily headache   . Deafness in left ear   . Depression   . DVT (deep venous thrombosis) ([place]) 2009/2010   "probably left leg"   . GERD (gastroesophageal reflux disease)   . History of cardiac catheterization 2005, 2012   Both catheterizations for "anginal symptoms "showed angiographically normal coronary arteries. -->  Negative Myoview June 2016.  Coronary calcification noted on chest CT  . History of kidney stones   . Hypercholesteremia   . Hypertension   . Nummular eczema   . Skin cancer    "right abdomen"    PAST SURGICAL HISTORY   Past Surgical History:  Procedure Laterality Date  . ABDOMINAL HYSTERECTOMY  1980's  . APPENDECTOMY Right   . BACK SURGERY     x 2, lower  . BLADDER TUMOR EXCISION     . BREAST BIOPSY Right   . BREAST EXCISIONAL BIOPSY Right   . CAROTID ARTERY DOPPLERS   10/2017   Carotid Dopplers: 40-59% stenosis on the Right Internal Carotid, < 40% stenosis in the Left. Both vertebral arteries show normal flow. Left subclavian artery is normal, but there is some flow disturbance in the right subclavian artery.  . COLONOSCOPY WITH PROPOFOL N/A 02/26/2015   Procedure: COLONOSCOPY WITH PROPOFOL;  Surgeon: Carol Ada, MD;  Location: [facility];  Service: Endoscopy;  Laterality: N/A;  . COLONOSCOPY WITH PROPOFOL N/A 12/06/2017   Procedure: COLONOSCOPY WITH PROPOFOL;  Surgeon: Juanita Craver, MD;  Location: [facility];  Service: Endoscopy;  Laterality: N/A;  . CORONARY CALCIUM SCORE & CT ANGIOGRAM  11/2017   Coronary calcium score 131 (moderate risk).  Moderate proximal LAD & LCx stenosis --> LOW RISK STUDY.  No evidence to suspect cardiac related chest pain --> correlates with cardiac catheterizations  . ENTEROSCOPY N/A 12/14/2017   Procedure: ENTEROSCOPY;  Surgeon: Carol Ada, MD;  Location: [facility];  Service: Endoscopy;  Laterality: N/A;  . ESOPHAGOGASTRODUODENOSCOPY Left 02/20/2015   Procedure: ESOPHAGOGASTRODUODENOSCOPY (EGD);  Surgeon:  Milus Banister, MD;  Location: [facility];  Service: Endoscopy;  Laterality: Left;  . ESOPHAGOGASTRODUODENOSCOPY (EGD) WITH PROPOFOL N/A 12/06/2017   Procedure: ESOPHAGOGASTRODUODENOSCOPY (EGD) WITH PROPOFOL;  Surgeon: Juanita Craver, MD;  Location: [facility];  Service: Endoscopy;  Laterality: N/A;  . HOT HEMOSTASIS N/A 12/14/2017   Procedure: HOT HEMOSTASIS (ARGON PLASMA COAGULATION/BICAP);  Surgeon: Carol Ada, MD;  Location: [facility];  Service: Endoscopy;  Laterality: N/A;  . JOINT REPLACEMENT    . KNEE ARTHROSCOPY Right ~ 2000  . LEFT HEART CATH AND CORONARY ANGIOGRAPHY  11/'05; 2/'12   Angiographically normal coronary arteries  . LUMBAR DISC SURGERY  1980's X 1; ~ 2002  . MEDIAL PARTIAL KNEE REPLACEMENT Right 2001  .  NM MYOVIEW LTD  02/2015   EF 65%.  No reversible ischemia.  Possible scar along the cardiac apex and anteroseptal wall.  (Mild severity, medium-sized defect in the apex anteroseptal wall noted on both resting and stress images).  No inducible ischemia.  Read as LOW RISK  . PARTIAL KNEE ARTHROPLASTY Left 2009  . REPLACEMENT TOTAL KNEE BILATERAL Bilateral   . REVISION TOTAL KNEE ARTHROPLASTY Left   . SKIN CANCER EXCISION Right    "abdomen"  . TRANSTHORACIC ECHOCARDIOGRAM  06/'16; 2/'19    A) mod Conc LVH. EF 60-65%.  No RWMA. GR 1 D. Mild Ao Stenosis. Mild LA dilation.;  B) EF 60-65%.  Mild LVH.  No or W MA.  No significant valve disease.;l 01/2020: Normal LV systolic function.  Mild LVH.  GR 1 DD.  EF 60 to 65%.  No R WMA.  Trace AI.  No AS    MEDICATIONS/ALLERGIES   Current Meds  Medication Sig  . acetaminophen (TYLENOL) 500 MG tablet Take 1,000 mg by mouth 2 (two) times daily as needed for moderate pain or headache.   . diclofenac sodium (VOLTAREN) 1 % GEL Apply 2 g topically 4 (four) times daily.  . diphenhydrAMINE (ALLERGY RELIEF) 25 mg capsule Take 25 mg by mouth daily as needed for allergies.   Marland Kitchen FLUoxetine (PROZAC) 40 MG capsule Take 40 mg by mouth daily as needed (depression).  . furosemide (LASIX) 20 MG tablet Take 1 tablet (20 mg total) by mouth as needed. For shortness of breathe or edema( swelling)  . hydrocortisone cream 1 % Apply to affected area 2 times daily  . NYSTATIN powder APPLY TOPICALLY 4 TIMES A DAY  . oxyCODONE-acetaminophen (PERCOCET) 10-325 MG tablet Take 1 tablet by mouth every 6 (six) hours as needed for pain.  . simvastatin (ZOCOR) 80 MG tablet Take 1 tablet (80 mg total) by mouth at bedtime.  . trandolapril (MAVIK) 2 MG tablet Take 1 tablet (2 mg total) by mouth daily.  . valACYclovir (VALTREX) 1000 MG tablet TAKE 1/2 TABLET BY MOUTH THREE TIMES DAILY AS NEEDED FOR OUTBREAK (Patient taking differently: Take 500 mg by mouth 3 (three) times daily as needed (viral  out break). )  . [DISCONTINUED] furosemide (LASIX) 20 MG tablet Take 1 tablet (20 mg total) by mouth as needed. For shortness of breathe or edema( swelling)    Allergies  Allergen Reactions  . Penicillins Shortness Of Breath, Itching, Swelling, Rash and Other (See Comments)    Has patient had a PCN reaction causing immediate rash, facial/tongue/throat swelling, SOB or lightheadedness with hypotension: Yes Has patient had a PCN reaction causing severe rash involving mucus membranes or skin necrosis: No Has patient had a PCN reaction that required hospitalization: Yes Has patient had a PCN reaction  occurring within the last 10 years: No If all of the above answers are "NO", then may proceed with Cephalosporin use.   . Azithromycin Itching, Swelling and Rash  . Levaquin [Levofloxacin In D5w] Hives, Itching, Swelling and Rash  . Clindamycin/Lincomycin Hives  . Erythromycin Base Itching  . Keflex [Cephalexin] Itching and Rash  . Sulfa Antibiotics Itching and Rash  . Tetracyclines & Related Itching and Rash    SOCIAL HISTORY/FAMILY HISTORY   Reviewed in Epic:  Pertinent findings: Her last living sister died earlier in 2023-03-14.  She believes that some of her symptoms were related to dealing with all of the funeral affairs, as well as morning.  Alycia Patten -PE, EKG, labs   Wt Readings from Last 3 Encounters:  04/29/20 202 lb (91.6 kg)  03/31/20 (!) 206 lb 3.2 oz (93.5 kg)  03/17/20 205 lb 6.4 oz (93.2 kg)    Physical Exam: BP 132/64   Pulse 97   Ht 5\' 5"  (1.651 m)   Wt 202 lb (91.6 kg)   SpO2 97%   BMI 33.61 kg/m  Physical Exam Vitals reviewed.  Constitutional:      General: She is not in acute distress.    Appearance: Normal appearance. She is obese. She is not ill-appearing (Relatively healthy appearing).     Comments: Well-groomed  HENT:     Head: Normocephalic and atraumatic.  Neck:     Vascular: No carotid bruit or JVD.  Cardiovascular:     Rate and Rhythm: Normal rate  and regular rhythm.  No extrasystoles are present.    Chest Wall: PMI is not displaced.     Pulses: Normal pulses.     Heart sounds: Murmur (1/6 SEM at RUSB) heard.  No friction rub. No gallop.   Pulmonary:     Effort: Pulmonary effort is normal. No respiratory distress.     Breath sounds: Normal breath sounds. No wheezing.  Chest:     Chest wall: No tenderness.  Musculoskeletal:        General: Swelling (Trace bilateral LE) present. Normal range of motion.     Cervical back: Normal range of motion. No rigidity.  Neurological:     General: No focal deficit present.     Mental Status: She is alert and oriented to person, place, and time.  Psychiatric:        Mood and Affect: Mood normal.        Behavior: Behavior normal.        Thought Content: Thought content normal.        Judgment: Judgment normal.     Adult ECG Report Not checked  Recent Labs:    Lab Results  Component Value Date   CHOL 274 (H) 03/31/2020   HDL 62 03/31/2020   LDLCALC 187 (H) 03/31/2020   TRIG 139 03/31/2020   CHOLHDL 4.4 03/31/2020   Lab Results  Component Value Date   CREATININE 1.01 (H) 03/31/2020   BUN 11 03/31/2020   NA 144 03/31/2020   K 4.5 03/31/2020   CL 104 03/31/2020   CO2 24 03/31/2020   Lab Results  Component Value Date   TSH 1.256 02/18/2015   Lab Results  Component Value Date   HGBA1C 5.0 12/10/2012     ASSESSMENT/PLAN    Problem List Items Addressed This Visit    Essential hypertension (Chronic)    Relatively well controlled on Mavik however there is room for [place] to add low-dose Toprol for her short bursts of  SVT      Relevant Medications   metoprolol succinate (TOPROL XL) 25 MG 24 hr tablet   furosemide (LASIX) 20 MG tablet   Mixed hyperlipidemia (Chronic)    LDL is worse in July despite being on 80 mg simvastatin. Lipids have been followed by PCP.  -> At this point I think we need to convert to rosuvastatin 40 mg and consider adding Zetia. (My original plan was  to defer to PCP, however -> recent note indicated well-controlled)      Relevant Medications   metoprolol succinate (TOPROL XL) 25 MG 24 hr tablet   furosemide (LASIX) 20 MG tablet   Coronary artery calcification seen on CAT scan - Primary (Chronic)    She does have a coronary calcium score of 131 otherwise nonischemic evaluations in the past.  At this point continued optimization med management is warranted.  Glycemic control seems to be relatively stable with an A1c of 5--no diabetes.  Adding beta-blocker for palpitations.  Is already on ACE inhibitor. We have opted to avoid using standing dose of aspirin to allow her to use NSAIDs for arthritic pain.  Goal LDL should be at least less than 100 if not less than 70.  Clearly not at goal.  --> My plan for now is to convert from simvastatin 80 mg to rosuvastatin 40 mg plus Zetia 10 mg.      Relevant Medications   metoprolol succinate (TOPROL XL) 25 MG 24 hr tablet   furosemide (LASIX) 20 MG tablet   PSVT (paroxysmal supraventricular tachycardia) (HCC)    Very frequent short bursts of PSVT/PAT.  Minimal PVCs noted.  These types of bursts are concerned for the possibility of developing atrial fibrillation although there is no evidence of atrial fibrillation on the monitor.  She is quite symptomatic with these episodes although they are very short.  I do think it warrants treating.  Plan: Add low-dose Toprol 25 mg daily (recommend taking at nighttime).      Relevant Medications   metoprolol succinate (TOPROL XL) 25 MG 24 hr tablet   furosemide (LASIX) 20 MG tablet     ------------------------------------------------  COVID-19 Education: The signs and symptoms of COVID-19 were discussed with the patient and how to seek care for testing (follow up with PCP or arrange E-visit).   The importance of social distancing and COVID-19 vaccination was discussed today.  I spent a total of 25 minutes with the patient in direct patient  consultation.  Additional time spent with chart review  / charting (studies, outside notes, etc): 8 Total Time: 33  min   Current medicines are reviewed at length with the patient today.  (+/- concerns) n/a  Notice: This dictation was prepared with Dragon dictation along with smaller phrase technology. Any transcriptional errors that result from this process are unintentional and may not be corrected upon review.  Patient Instructions / Medication Changes & Studies & Tests Ordered   Patient Instructions  Medication Instructions:\   START TAKING METOPROLOL SUCCINATE 25 MG  ONE TABLET BY MOUTH DAILY   Recommendations for vagal maneuvers:  "Bearing down"  Coughing  Gagging  Icy, cold towel on face or drink ice cold water   *If you need a refill on your cardiac medications before your next appointment, please call your pharmacy*   Lab Work: NOT  NEEDED If you have labs (blood work) drawn today and your tests are completely normal, you will receive your results only by: Marland Kitchen MyChart Message (if you have  MyChart) OR . A paper copy in the mail If you have any lab test that is abnormal or we need to change your treatment, we will call you to review the results.   Testing/Procedures: NOT NEEDED   Follow-Up: At [facility], you and your health needs are our priority.  As part of our continuing mission to provide you with exceptional heart care, we have created designated Provider Care Teams.  These Care Teams include your primary Cardiologist (physician) and Advanced Practice Providers (APPs -  Physician Assistants and Nurse Practitioners) who all work together to provide you with the care you need, when you need it.  We recommend signing up for the patient portal called "MyChart".  Sign up information is provided on this After Visit Summary.  MyChart is used to connect with patients for Virtual Visits (Telemedicine).  Patients are able to view lab/test results, encounter notes,  upcoming appointments, etc.  Non-urgent messages can be sent to your provider as well.   To learn more about what you can do with MyChart, go to [email or web address].    Your next appointment:   3 TO 4  month(s)  The format for your next appointment:   Virtual Visit   Provider:   Glenetta Hew, MD   Addendum:  DC simvastatin 80 mg completion of current bottle  Change to the rosuvastatin 40 mg daily plus Zetia 10 mg daily  New Rx: Rosuvastatin 40 mg p.o. daily; dispense #90 tablets, 3 refills Ezetimibe 10 mg p.o. daily; dispense #90 tabs, 3 refills    Studies Ordered:   No orders of the defined types were placed in this encounter.    Glenetta Hew, M.D., M.S. Interventional Cardiologist   Pager # [phone] Phone # [phone] [address] [place], [place] [address]   Thank you for choosing [facility] at [facility]!!

## 2020-05-02 ENCOUNTER — Encounter: Payer: Self-pay | Admitting: Cardiology

## 2020-05-02 NOTE — Assessment & Plan Note (Signed)
She does have a coronary calcium score of 131 otherwise nonischemic evaluations in the past.  At this point continued optimization med management is warranted.  Glycemic control seems to be relatively stable with an A1c of 5--no diabetes.  Adding beta-blocker for palpitations.  Is already on ACE inhibitor. We have opted to avoid using standing dose of aspirin to allow her to use NSAIDs for arthritic pain.  Goal LDL should be at least less than 100 if not less than 70.  Clearly not at goal.  --> My plan for now is to convert from simvastatin 80 mg to rosuvastatin 40 mg plus Zetia 10 mg.

## 2020-05-02 NOTE — Assessment & Plan Note (Signed)
Very frequent short bursts of PSVT/PAT.  Minimal PVCs noted.  These types of bursts are concerned for the possibility of developing atrial fibrillation although there is no evidence of atrial fibrillation on the monitor.  She is quite symptomatic with these episodes although they are very short.  I do think it warrants treating.  Plan: Add low-dose Toprol 25 mg daily (recommend taking at nighttime).

## 2020-05-02 NOTE — Assessment & Plan Note (Addendum)
LDL is worse in July despite being on 80 mg simvastatin. Lipids have been followed by PCP.  -> At this point I think we need to convert to rosuvastatin 40 mg and consider adding Zetia. (My original plan was to defer to PCP, however -> recent note indicated well-controlled)

## 2020-05-02 NOTE — Assessment & Plan Note (Signed)
Relatively well controlled on Mavik however there is room for [place] to add low-dose Toprol for her short bursts of SVT

## 2020-05-05 DIAGNOSIS — T8484XD Pain due to internal orthopedic prosthetic devices, implants and grafts, subsequent encounter: Secondary | ICD-10-CM | POA: Diagnosis not present

## 2020-05-05 DIAGNOSIS — Z96653 Presence of artificial knee joint, bilateral: Secondary | ICD-10-CM | POA: Diagnosis not present

## 2020-05-19 DIAGNOSIS — Z96659 Presence of unspecified artificial knee joint: Secondary | ICD-10-CM | POA: Diagnosis not present

## 2020-05-19 DIAGNOSIS — T84018A Broken internal joint prosthesis, other site, initial encounter: Secondary | ICD-10-CM | POA: Diagnosis not present

## 2020-05-25 ENCOUNTER — Encounter: Payer: Self-pay | Admitting: Nurse Practitioner

## 2020-05-25 ENCOUNTER — Other Ambulatory Visit: Payer: Self-pay

## 2020-05-25 ENCOUNTER — Ambulatory Visit (INDEPENDENT_AMBULATORY_CARE_PROVIDER_SITE_OTHER): Payer: Medicare HMO

## 2020-05-25 VITALS — BP 140/80 | HR 98 | Temp 98.2°F | Ht 65.0 in | Wt 202.6 lb

## 2020-05-25 DIAGNOSIS — Z23 Encounter for immunization: Secondary | ICD-10-CM | POA: Diagnosis not present

## 2020-05-25 NOTE — Progress Notes (Signed)
Pt presents today for a flu shot

## 2020-06-14 ENCOUNTER — Ambulatory Visit: Payer: Medicare HMO

## 2020-06-16 ENCOUNTER — Other Ambulatory Visit (HOSPITAL_COMMUNITY): Payer: Self-pay | Admitting: Internal Medicine

## 2020-06-16 ENCOUNTER — Telehealth: Payer: Self-pay | Admitting: *Deleted

## 2020-06-16 ENCOUNTER — Ambulatory Visit: Payer: Medicare HMO | Attending: Internal Medicine

## 2020-06-16 DIAGNOSIS — Z23 Encounter for immunization: Secondary | ICD-10-CM

## 2020-06-16 MED ORDER — ROSUVASTATIN CALCIUM 40 MG PO TABS
40.0000 mg | ORAL_TABLET | Freq: Every day | ORAL | 3 refills | Status: DC
Start: 2020-06-16 — End: 2020-08-18

## 2020-06-16 MED ORDER — EZETIMIBE 10 MG PO TABS
10.0000 mg | ORAL_TABLET | Freq: Every day | ORAL | 3 refills | Status: DC
Start: 2020-06-16 — End: 2020-09-29

## 2020-06-16 NOTE — Telephone Encounter (Signed)
Spoke to patient . She states she had been taking simvastatin 80 mg . She just receive  Current bottle.  patient is aware to complete the current bottle then stop taking and start taking rosuvastatin 40 mg and zetia 10 mg daily   new prescription sent to pharmacy

## 2020-06-16 NOTE — Progress Notes (Signed)
   Covid-19 Vaccination Clinic  Name:  Peggy [name]    MRN: [id number] DOB: Oct 09, 1945  06/16/2020  [name] was observed post Covid-19 immunization for 15 minutes without incident. She was provided with Vaccine Information Sheet and instruction to access the V-Safe system.   [name] was instructed to call 911 with any severe reactions post vaccine: Marland Kitchen Difficulty breathing  . Swelling of face and throat  . A fast heartbeat  . A bad rash all over body  . Dizziness and weakness

## 2020-06-16 NOTE — Telephone Encounter (Addendum)
-----   Message from Leonie Man, MD  ----- Regarding: changing statin dose   [facility] ----- Message ----- From: Leonie Man, MD  Addendum: DC simvastatin 80 mg completion of current bottle Change to the rosuvastatin 40 mg daily plus Zetia 10 mg daily  New Rx: Rosuvastatin 40 mg p.o. daily; dispense #90 tablets, 3 refills Ezetimibe 10 mg p.o. daily; dispense #90 tabs, 3 refills

## 2020-06-23 NOTE — Telephone Encounter (Signed)
Pt c/o medication issue:  1. Name of Medication: rosuvastatin (CRESTOR) 40 MG tablet  2. How are you currently taking this medication (dosage and times per day)? 1 tablet (40 mg total) by in the morning  3. Are you having a reaction (difficulty breathing--STAT)? No   4. What is your medication issue? Patient states she stopped taking the medication because it caused joint pain and severe cramping all over her body.

## 2020-08-09 ENCOUNTER — Other Ambulatory Visit: Payer: Self-pay

## 2020-08-09 MED ORDER — VALACYCLOVIR HCL 1 G PO TABS: ORAL_TABLET | ORAL | 0 refills | Status: DC

## 2020-08-16 DIAGNOSIS — T84018A Broken internal joint prosthesis, other site, initial encounter: Secondary | ICD-10-CM | POA: Insufficient documentation

## 2020-08-16 DIAGNOSIS — T8450XA Infection and inflammatory reaction due to unspecified internal joint prosthesis, initial encounter: Secondary | ICD-10-CM | POA: Diagnosis not present

## 2020-08-16 DIAGNOSIS — Z96659 Presence of unspecified artificial knee joint: Secondary | ICD-10-CM | POA: Diagnosis not present

## 2020-08-18 ENCOUNTER — Encounter: Payer: Self-pay | Admitting: Nurse Practitioner

## 2020-08-18 ENCOUNTER — Ambulatory Visit (INDEPENDENT_AMBULATORY_CARE_PROVIDER_SITE_OTHER): Payer: Medicare HMO

## 2020-08-18 ENCOUNTER — Other Ambulatory Visit: Payer: Self-pay

## 2020-08-18 ENCOUNTER — Ambulatory Visit (INDEPENDENT_AMBULATORY_CARE_PROVIDER_SITE_OTHER): Payer: Medicare HMO | Admitting: Nurse Practitioner

## 2020-08-18 VITALS — BP 144/86 | HR 73 | Temp 98.2°F | Ht 65.0 in | Wt 206.0 lb

## 2020-08-18 DIAGNOSIS — E782 Mixed hyperlipidemia: Secondary | ICD-10-CM

## 2020-08-18 DIAGNOSIS — Z124 Encounter for screening for malignant neoplasm of cervix: Secondary | ICD-10-CM | POA: Diagnosis not present

## 2020-08-18 DIAGNOSIS — M25562 Pain in left knee: Secondary | ICD-10-CM | POA: Diagnosis not present

## 2020-08-18 DIAGNOSIS — G8929 Other chronic pain: Secondary | ICD-10-CM

## 2020-08-18 DIAGNOSIS — I700 Atherosclerosis of aorta: Secondary | ICD-10-CM | POA: Diagnosis not present

## 2020-08-18 DIAGNOSIS — M25561 Pain in right knee: Secondary | ICD-10-CM | POA: Diagnosis not present

## 2020-08-18 DIAGNOSIS — I10 Essential (primary) hypertension: Secondary | ICD-10-CM | POA: Diagnosis not present

## 2020-08-18 DIAGNOSIS — Z01818 Encounter for other preprocedural examination: Secondary | ICD-10-CM

## 2020-08-18 DIAGNOSIS — Z0000 Encounter for general adult medical examination without abnormal findings: Secondary | ICD-10-CM | POA: Diagnosis not present

## 2020-08-18 LAB — CMP14+EGFR
ALT: 10 IU/L (ref 0–32)
AST: 18 IU/L (ref 0–40)
Albumin/Globulin Ratio: 1.4 (ref 1.2–2.2)
Albumin: 4.3 g/dL (ref 3.7–4.7)
Alkaline Phosphatase: 144 IU/L — ABNORMAL HIGH (ref 44–121)
BUN/Creatinine Ratio: 10 — ABNORMAL LOW (ref 12–28)
BUN: 9 mg/dL (ref 8–27)
Bilirubin Total: 0.5 mg/dL (ref 0.0–1.2)
CO2: 24 mmol/L (ref 20–29)
Calcium: 9.2 mg/dL (ref 8.7–10.3)
Chloride: 102 mmol/L (ref 96–106)
Creatinine, Ser: 0.91 mg/dL (ref 0.57–1.00)
GFR calc Af Amer: 71 mL/min/{1.73_m2} (ref >59)
GFR calc non Af Amer: 61 mL/min/{1.73_m2} (ref >59)
Globulin, Total: 3.1 g/dL (ref 1.5–4.5)
Glucose: 78 mg/dL (ref 65–99)
Potassium: 3.8 mmol/L (ref 3.5–5.2)
Sodium: 142 mmol/L (ref 134–144)
Total Protein: 7.4 g/dL (ref 6.0–8.5)

## 2020-08-18 LAB — LIPID PANEL
Chol/HDL Ratio: 2.4 ratio (ref 0.0–4.4)
Cholesterol, Total: 167 mg/dL (ref 100–199)
HDL: 70 mg/dL (ref >39)
LDL Chol Calc (NIH): 75 mg/dL (ref 0–99)
Triglycerides: 127 mg/dL (ref 0–149)
VLDL Cholesterol Cal: 22 mg/dL (ref 5–40)

## 2020-08-18 NOTE — Patient Instructions (Signed)
Peggy [name] , Thank you for taking time to come for your Medicare Wellness Visit. I appreciate your ongoing commitment to your health goals. Please review the following plan we discussed and let me know if I can assist you in the future.   Screening recommendations/referrals: Colonoscopy: completed 12/06/2017, due 12/07/2022 Mammogram: not required Bone Density: completed 10/10/2018 Recommended yearly ophthalmology/optometry visit for glaucoma screening and checkup Recommended yearly dental visit for hygiene and checkup  Vaccinations: Influenza vaccine: completed 05/25/2020, due 04/04/2021 Pneumococcal vaccine: completed 09/03/2018 Tdap vaccine: completed 11/19/2017 Shingles vaccine: completed   Covid-19: 06/16/2020, 10/24/2019, 09/23/2019  Advanced directives: Advance directive discussed with you today. Even though you declined this today please call our office should you change your mind and we can give you the proper paperwork for you to fill out.  Conditions/risks identified: none  Next appointment: Follow up in one year for your annual wellness visit    Preventive Care 65 Years and Older, Female Preventive care refers to lifestyle choices and visits with your health care provider that can promote health and wellness. What does preventive care include?  A yearly physical exam. This is also called an annual well check.  Dental exams once or twice a year.  Routine eye exams. Ask your health care provider how often you should have your eyes checked.  Personal lifestyle choices, including:  Daily care of your teeth and gums.  Regular physical activity.  Eating a healthy diet.  Avoiding tobacco and drug use.  Limiting alcohol use.  Practicing safe sex.  Taking low-dose aspirin every day.  Taking vitamin and mineral supplements as recommended by your health care provider. What happens during an annual well check? The services and screenings done by your health care provider during  your annual well check will depend on your age, overall health, lifestyle risk factors, and family history of disease. Counseling  Your health care provider may ask you questions about your:  Alcohol use.  Tobacco use.  Drug use.  Emotional well-being.  Home and relationship well-being.  Sexual activity.  Eating habits.  History of falls.  Memory and ability to understand (cognition).  Work and work Statistician.  Reproductive health. Screening  You may have the following tests or measurements:  Height, weight, and BMI.  Blood pressure.  Lipid and cholesterol levels. These may be checked every 5 years, or more frequently if you are over 16 years old.  Skin check.  Lung cancer screening. You may have this screening every year starting at age 72 if you have a 30-pack-year history of smoking and currently smoke or have quit within the past 15 years.  Fecal occult blood test (FOBT) of the stool. You may have this test every year starting at age 18.  Flexible sigmoidoscopy or colonoscopy. You may have a sigmoidoscopy every 5 years or a colonoscopy every 10 years starting at age 24.  Hepatitis C blood test.  Hepatitis B blood test.  Sexually transmitted disease (STD) testing.  Diabetes screening. This is done by checking your blood sugar (glucose) after you have not eaten for a while (fasting). You may have this done every 1-3 years.  Bone density scan. This is done to screen for osteoporosis. You may have this done starting at age 34.  Mammogram. This may be done every 1-2 years. Talk to your health care provider about how often you should have regular mammograms. Talk with your health care provider about your test results, treatment options, and if necessary, the need for  more tests. Vaccines  Your health care provider may recommend certain vaccines, such as:  Influenza vaccine. This is recommended every year.  Tetanus, diphtheria, and acellular pertussis (Tdap,  Td) vaccine. You may need a Td booster every 10 years.  Zoster vaccine. You may need this after age 39.  Pneumococcal 13-valent conjugate (PCV13) vaccine. One dose is recommended after age 45.  Pneumococcal polysaccharide (PPSV23) vaccine. One dose is recommended after age 53. Talk to your health care provider about which screenings and vaccines you need and how often you need them. This information is not intended to replace advice given to you by your health care provider. Make sure you discuss any questions you have with your health care provider. Document Released: 09/17/2015 Document Revised: 05/10/2016 Document Reviewed: 06/22/2015 Elsevier Interactive Patient Education  2017 [employer] Prevention in the Home Falls can cause injuries. They can happen to people of all ages. There are many things you can do to make your home safe and to help prevent falls. What can I do on the outside of my home?  Regularly fix the edges of walkways and driveways and fix any cracks.  Remove anything that might make you trip as you walk through a door, such as a raised step or threshold.  Trim any bushes or trees on the path to your home.  Use bright outdoor lighting.  Clear any walking paths of anything that might make someone trip, such as rocks or tools.  Regularly check to see if handrails are loose or broken. Make sure that both sides of any steps have handrails.  Any raised decks and porches should have guardrails on the edges.  Have any leaves, snow, or ice cleared regularly.  Use sand or salt on walking paths during winter.  Clean up any spills in your garage right away. This includes oil or grease spills. What can I do in the bathroom?  Use night lights.  Install grab bars by the toilet and in the tub and shower. Do not use towel bars as grab bars.  Use non-skid mats or decals in the tub or shower.  If you need to sit down in the shower, use a plastic, non-slip  stool.  Keep the floor dry. Clean up any water that spills on the floor as soon as it happens.  Remove soap buildup in the tub or shower regularly.  Attach bath mats securely with double-sided non-slip rug tape.  Do not have throw rugs and other things on the floor that can make you trip. What can I do in the bedroom?  Use night lights.  Make sure that you have a light by your bed that is easy to reach.  Do not use any sheets or blankets that are too big for your bed. They should not hang down onto the floor.  Have a firm chair that has side arms. You can use this for support while you get dressed.  Do not have throw rugs and other things on the floor that can make you trip. What can I do in the kitchen?  Clean up any spills right away.  Avoid walking on wet floors.  Keep items that you use a lot in easy-to-reach places.  If you need to reach something above you, use a strong step stool that has a grab bar.  Keep electrical cords out of the way.  Do not use floor polish or wax that makes floors slippery. If you must use wax, use non-skid  floor wax.  Do not have throw rugs and other things on the floor that can make you trip. What can I do with my stairs?  Do not leave any items on the stairs.  Make sure that there are handrails on both sides of the stairs and use them. Fix handrails that are broken or loose. Make sure that handrails are as long as the stairways.  Check any carpeting to make sure that it is firmly attached to the stairs. Fix any carpet that is loose or worn.  Avoid having throw rugs at the top or bottom of the stairs. If you do have throw rugs, attach them to the floor with carpet tape.  Make sure that you have a light switch at the top of the stairs and the bottom of the stairs. If you do not have them, ask someone to add them for you. What else can I do to help prevent falls?  Wear shoes that:  Do not have high heels.  Have rubber bottoms.  Are  comfortable and fit you well.  Are closed at the toe. Do not wear sandals.  If you use a stepladder:  Make sure that it is fully opened. Do not climb a closed stepladder.  Make sure that both sides of the stepladder are locked into place.  Ask someone to hold it for you, if possible.  Clearly mark and make sure that you can see:  Any grab bars or handrails.  First and last steps.  Where the edge of each step is.  Use tools that help you move around (mobility aids) if they are needed. These include:  Canes.  Walkers.  Scooters.  Crutches.  Turn on the lights when you go into a dark area. Replace any light bulbs as soon as they burn out.  Set up your furniture so you have a clear path. Avoid moving your furniture around.  If any of your floors are uneven, fix them.  If there are any pets around you, be aware of where they are.  Review your medicines with your doctor. Some medicines can make you feel dizzy. This can increase your chance of falling. Ask your doctor what other things that you can do to help prevent falls. This information is not intended to replace advice given to you by your health care provider. Make sure you discuss any questions you have with your health care provider. Document Released: 06/17/2009 Document Revised: 01/27/2016 Document Reviewed: 09/25/2014 Elsevier Interactive Patient Education  2017 [employer].

## 2020-08-18 NOTE — Patient Instructions (Signed)

## 2020-08-18 NOTE — Progress Notes (Signed)
I,Yamilka Roman [employer] as a Education administrator for [employer], FNP.,have documented all relevant documentation on the behalf of [name], FNP,as directed by  [name], FNP while in the presence of [name], [place].  This visit occurred during the SARS-CoV-2 public health emergency.  Safety protocols were in place, including screening questions prior to the visit, additional usage of staff PPE, and extensive cleaning of exam room while observing appropriate contact time as indicated for disinfecting solutions.  Subjective:     Patient ID: Peggy [name] , female    DOB: 04-04-1946 , 74 y.o.   MRN: [id number]   Chief Complaint  Patient presents with  . Hyperlipidemia    HPI  Patient here for a f/u on her cholesterol.   She is scheduled for left knee replacement late January or Early February. She will likely need 2 operations done due to the damage.   Hyperlipidemia This is a chronic problem. The problem is controlled. Recent lipid tests were reviewed and are normal. She has no history of chronic renal disease. There are no known factors aggravating her hyperlipidemia.     Past Medical History:  Diagnosis Date  . Anxiety   . Arthritis    "all over"  . Asthma   . Bladder cancer ([place])   . Chronic bronchitis ([place])    'get it q yr"  . Chronic lower back pain   . Daily headache   . Deafness in left ear   . Depression   . DVT (deep venous thrombosis) ([place]) 2009/2010   "probably left leg"   . GERD (gastroesophageal reflux disease)   . History of cardiac catheterization 2005, 2012   Both catheterizations for "anginal symptoms "showed angiographically normal coronary arteries. -->  Negative Myoview June 2016.  Coronary calcification noted on chest CT  . History of kidney stones   . Hypercholesteremia   . Hypertension   . Nummular eczema   . Skin cancer    "right abdomen"     Family History  Problem Relation Age of Onset  . Heart failure Mother   . Cancer Mother   .  Hypertension Mother   . Hypertension Father   . Heart attack Father        Noted on cardiology intake form as "massive heart attack" at age 86  . Heart failure Sister   . Heart attack Sister        Not listed on her cardiology intake form  . Breast cancer Sister   . Heart failure Sister   . Heart attack Sister        Not listed on her cardiology intake  . Cancer Sister   . Breast cancer Sister      Current Outpatient Medications:  .  acetaminophen (TYLENOL) 500 MG tablet, Take 1,000 mg by mouth 2 (two) times daily as needed for moderate pain or headache. , Disp: , Rfl:  .  ezetimibe (ZETIA) 10 MG tablet, Take 1 tablet (10 mg total) by mouth daily., Disp: 90 tablet, Rfl: 3 .  furosemide (LASIX) 20 MG tablet, Take 1 tablet (20 mg total) by mouth as needed. For shortness of breathe or edema( swelling), Disp: 20 tablet, Rfl: 11 .  metoprolol succinate (TOPROL XL) 25 MG 24 hr tablet, Take 1 tablet (25 mg total) by mouth daily., Disp: 90 tablet, Rfl: 3 .  trandolapril (MAVIK) 2 MG tablet, Take 1 tablet (2 mg total) by mouth daily., Disp: 90 tablet, Rfl: 1 .  valACYclovir (VALTREX) 1000  MG tablet, TAKE 1/2 TABLET BY MOUTH THREE TIMES DAILY AS NEEDED FOR OUTBREAK, Disp: 315 tablet, Rfl: 0 .  diclofenac sodium (VOLTAREN) 1 % GEL, Apply 2 g topically 4 (four) times daily. (Patient not taking: Reported on 08/18/2020), Disp: 100 g, Rfl: 2 .  diphenhydrAMINE (BENADRYL) 25 mg capsule, Take 25 mg by mouth daily as needed for allergies.  (Patient not taking: Reported on 08/18/2020), Disp: , Rfl:  .  FLUoxetine (PROZAC) 40 MG capsule, Take 40 mg by mouth daily as needed (depression). (Patient not taking: Reported on 08/18/2020), Disp: , Rfl:  .  hydrocortisone cream 1 %, Apply to affected area 2 times daily (Patient not taking: Reported on 08/18/2020), Disp: 30 g, Rfl: 1 .  NYSTATIN powder, APPLY TOPICALLY 4 TIMES A DAY (Patient not taking: Reported on 08/18/2020), Disp: 15 g, Rfl: 0 .   oxyCODONE-acetaminophen (PERCOCET) 10-325 MG tablet, Take 1 tablet by mouth every 6 (six) hours as needed for pain. (Patient not taking: Reported on 08/18/2020), Disp: 20 tablet, Rfl: 0   Allergies  Allergen Reactions  . Penicillins Shortness Of Breath, Itching, Swelling, Rash and Other (See Comments)    Has patient had a PCN reaction causing immediate rash, facial/tongue/throat swelling, SOB or lightheadedness with hypotension: Yes Has patient had a PCN reaction causing severe rash involving mucus membranes or skin necrosis: No Has patient had a PCN reaction that required hospitalization: Yes Has patient had a PCN reaction occurring within the last 10 years: No If all of the above answers are "NO", then may proceed with Cephalosporin use.   . Azithromycin Itching, Swelling and Rash  . Levaquin [Levofloxacin In D5w] Hives, Itching, Swelling and Rash  . Clindamycin/Lincomycin Hives  . Erythromycin Base Itching  . Keflex [Cephalexin] Itching and Rash  . Sulfa Antibiotics Itching and Rash  . Tetracyclines & Related Itching and Rash     Review of Systems  Constitutional: Negative.   HENT: Negative.   Eyes: Negative.   Respiratory: Negative.   Cardiovascular: Negative.   Gastrointestinal: Negative.   Endocrine: Negative.   Genitourinary: Negative.   Musculoskeletal: Negative.   Skin: Negative.   Neurological: Negative.   Hematological: Negative.   Psychiatric/Behavioral: Negative.      Today's Vitals   08/18/20 0850  BP: (!) 144/86  Pulse: 73  Temp: 98.2 F (36.8 C)  TempSrc: Oral  Weight: 206 lb (93.4 kg)  Height: $Remove'5\' 5"'fdDUgyZ$  (1.651 m)  PainSc: 0-No pain   Body mass index is 34.28 kg/m.   Objective:  Physical Exam Vitals reviewed.  Constitutional:      General: She is not in acute distress.    Appearance: Normal appearance. She is obese.  Cardiovascular:     Rate and Rhythm: Normal rate and regular rhythm.     Pulses: Normal pulses.     Heart sounds: Normal heart  sounds. No murmur heard.   Pulmonary:     Effort: Pulmonary effort is normal. No respiratory distress.     Breath sounds: Normal breath sounds. No wheezing.  Musculoskeletal:        General: Swelling, tenderness (bilateral knees ) and deformity present.     Right lower leg: Edema present.     Left lower leg: Edema present.  Skin:    General: Skin is warm and dry.     Capillary Refill: Capillary refill takes less than 2 seconds.  Neurological:     General: No focal deficit present.     Mental Status: She is alert and  oriented to person, place, and time.     Cranial Nerves: No cranial nerve deficit.  Psychiatric:        Mood and Affect: Mood normal.        Behavior: Behavior normal.        Thought Content: Thought content normal.        Judgment: Judgment normal.         Assessment And Plan:     1. Essential hypertension  Chronic, blood pressure is slightly elevated but she relates to being in pain from her knee  Will do an EKG since she is planning to have surgery in the next couple of months - CMP14+EGFR - EKG 12-Lead  2. Mixed hyperlipidemia  Chronic, controlled  Continue with current medications, she was added by Dr. Ellyn Hack on Ezetimibe - Lipid panel - CMP14+EGFR  3. Aortic atherosclerosis ([place])  She is on ezetimibe  Encouraged to continue to avoid high fat foods - EKG 12-Lead  4. Chronic pain of both knees  She is to have surgery on her left knee in January or February  EKG done with NSR HR 85  5. Pre-operative clearance - EKG 12-Lead     Patient was given opportunity to ask questions. Patient verbalized understanding of the plan and was able to repeat key elements of the plan. All questions were answered to their satisfaction.   Teola Bradley, FNP, have reviewed all documentation for this visit. The documentation on 08/18/20 for the exam, diagnosis, procedures, and orders are all accurate and complete.  THE PATIENT IS ENCOURAGED TO PRACTICE  SOCIAL DISTANCING DUE TO THE COVID-19 PANDEMIC.

## 2020-08-18 NOTE — Progress Notes (Signed)
This visit occurred during the SARS-CoV-2 public health emergency.  Safety protocols were in place, including screening questions prior to the visit, additional usage of staff PPE, and extensive cleaning of exam room while observing appropriate contact time as indicated for disinfecting solutions.  Subjective:   LOGANN WHITEBREAD is a 74 y.o. female who presents for Medicare Annual (Subsequent) preventive examination.  Review of Systems     Cardiac Risk Factors include: advanced age (>82men, >67 women);dyslipidemia;hypertension;obesity (BMI >30kg/m2);sedentary lifestyle     Objective:    Today's Vitals   08/18/20 0934  BP: (!) 144/86  Pulse: 73  Temp: 98.2 F (36.8 C)  TempSrc: Oral  Weight: 206 lb (93.4 kg)  Height: 5\' 5"  (1.651 m)  PainSc: 10-Worst pain ever   Body mass index is 34.28 kg/m.  Advanced Directives 08/18/2020 12/10/2019 08/13/2019 07/15/2019 07/30/2018 12/14/2017 09/04/2017  Does Patient Have a Medical Advance Directive? No No Yes No No No No  Type of Advance Directive - - Page in Chart? - - No - copy requested - - - -  Would patient like information on creating a medical advance directive? - - - No - Patient declined Yes (MAU/Ambulatory/Procedural Areas - Information given) No - Patient declined -    Current Medications (verified) Outpatient Encounter Medications as of 08/18/2020  Medication Sig  . acetaminophen (TYLENOL) 500 MG tablet Take 1,000 mg by mouth 2 (two) times daily as needed for moderate pain or headache.   . diclofenac sodium (VOLTAREN) 1 % GEL Apply 2 g topically 4 (four) times daily. (Patient not taking: Reported on 08/18/2020)  . diphenhydrAMINE (BENADRYL) 25 mg capsule Take 25 mg by mouth daily as needed for allergies.  (Patient not taking: Reported on 08/18/2020)  . ezetimibe (ZETIA) 10 MG tablet Take 1 tablet (10 mg total) by mouth daily.  Marland Kitchen FLUoxetine (PROZAC) 40 MG capsule Take  40 mg by mouth daily as needed (depression). (Patient not taking: Reported on 08/18/2020)  . furosemide (LASIX) 20 MG tablet Take 1 tablet (20 mg total) by mouth as needed. For shortness of breathe or edema( swelling)  . hydrocortisone cream 1 % Apply to affected area 2 times daily (Patient not taking: Reported on 08/18/2020)  . metoprolol succinate (TOPROL XL) 25 MG 24 hr tablet Take 1 tablet (25 mg total) by mouth daily.  . NYSTATIN powder APPLY TOPICALLY 4 TIMES A DAY (Patient not taking: Reported on 08/18/2020)  . oxyCODONE-acetaminophen (PERCOCET) 10-325 MG tablet Take 1 tablet by mouth every 6 (six) hours as needed for pain. (Patient not taking: Reported on 08/18/2020)  . trandolapril (MAVIK) 2 MG tablet Take 1 tablet (2 mg total) by mouth daily.  . valACYclovir (VALTREX) 1000 MG tablet TAKE 1/2 TABLET BY MOUTH THREE TIMES DAILY AS NEEDED FOR OUTBREAK   No facility-administered encounter medications on file as of 08/18/2020.    Allergies (verified) Penicillins, Azithromycin, Levaquin [levofloxacin in d5w], Clindamycin/lincomycin, Erythromycin base, Keflex [cephalexin], Sulfa antibiotics, and Tetracyclines & related   History: Past Medical History:  Diagnosis Date  . Anxiety   . Arthritis    "all over"  . Asthma   . Bladder cancer ([place])   . Chronic bronchitis ([place])    'get it q yr"  . Chronic lower back pain   . Daily headache   . Deafness in left ear   . Depression   . DVT (deep venous thrombosis) ([place]) 2009/2010   "probably  left leg"   . GERD (gastroesophageal reflux disease)   . History of cardiac catheterization 2005, 2012   Both catheterizations for "anginal symptoms "showed angiographically normal coronary arteries. -->  Negative Myoview June 2016.  Coronary calcification noted on chest CT  . History of kidney stones   . Hypercholesteremia   . Hypertension   . Nummular eczema   . Skin cancer    "right abdomen"   Past Surgical History:  Procedure Laterality Date   . ABDOMINAL HYSTERECTOMY  1980's  . APPENDECTOMY Right   . BACK SURGERY     x 2, lower  . BLADDER TUMOR EXCISION    . BREAST BIOPSY Right   . BREAST EXCISIONAL BIOPSY Right   . CAROTID ARTERY DOPPLERS   10/2017   Carotid Dopplers: 40-59% stenosis on the Right Internal Carotid, < 40% stenosis in the Left. Both vertebral arteries show normal flow. Left subclavian artery is normal, but there is some flow disturbance in the right subclavian artery.  . COLONOSCOPY WITH PROPOFOL N/A 02/26/2015   Procedure: COLONOSCOPY WITH PROPOFOL;  Surgeon: Carol Ada, MD;  Location: [facility];  Service: Endoscopy;  Laterality: N/A;  . COLONOSCOPY WITH PROPOFOL N/A 12/06/2017   Procedure: COLONOSCOPY WITH PROPOFOL;  Surgeon: Juanita Craver, MD;  Location: [facility];  Service: Endoscopy;  Laterality: N/A;  . CORONARY CALCIUM SCORE & CT ANGIOGRAM  11/2017   Coronary calcium score 131 (moderate risk).  Moderate proximal LAD & LCx stenosis --> LOW RISK STUDY.  No evidence to suspect cardiac related chest pain --> correlates with cardiac catheterizations  . ENTEROSCOPY N/A 12/14/2017   Procedure: ENTEROSCOPY;  Surgeon: Carol Ada, MD;  Location: [facility];  Service: Endoscopy;  Laterality: N/A;  . ESOPHAGOGASTRODUODENOSCOPY Left 02/20/2015   Procedure: ESOPHAGOGASTRODUODENOSCOPY (EGD);  Surgeon: Milus Banister, MD;  Location: [facility];  Service: Endoscopy;  Laterality: Left;  . ESOPHAGOGASTRODUODENOSCOPY (EGD) WITH PROPOFOL N/A 12/06/2017   Procedure: ESOPHAGOGASTRODUODENOSCOPY (EGD) WITH PROPOFOL;  Surgeon: Juanita Craver, MD;  Location: [facility];  Service: Endoscopy;  Laterality: N/A;  . HOT HEMOSTASIS N/A 12/14/2017   Procedure: HOT HEMOSTASIS (ARGON PLASMA COAGULATION/BICAP);  Surgeon: Carol Ada, MD;  Location: [facility];  Service: Endoscopy;  Laterality: N/A;  . JOINT REPLACEMENT    . KNEE ARTHROSCOPY Right ~ 2000  . LEFT HEART CATH AND CORONARY ANGIOGRAPHY  11/'05; 2/'12    Angiographically normal coronary arteries  . LUMBAR DISC SURGERY  1980's X 1; ~ 2002  . MEDIAL PARTIAL KNEE REPLACEMENT Right 2001  . NM MYOVIEW LTD  02/2015   EF 65%.  No reversible ischemia.  Possible scar along the cardiac apex and anteroseptal wall.  (Mild severity, medium-sized defect in the apex anteroseptal wall noted on both resting and stress images).  No inducible ischemia.  Read as LOW RISK  . PARTIAL KNEE ARTHROPLASTY Left 2009  . REPLACEMENT TOTAL KNEE BILATERAL Bilateral   . REVISION TOTAL KNEE ARTHROPLASTY Left   . SKIN CANCER EXCISION Right    "abdomen"  . TRANSTHORACIC ECHOCARDIOGRAM  06/'16; 2/'19    A) mod Conc LVH. EF 60-65%.  No RWMA. GR 1 D. Mild Ao Stenosis. Mild LA dilation.;  B) EF 60-65%.  Mild LVH.  No or W MA.  No significant valve disease.;l 01/2020: Normal LV systolic function.  Mild LVH.  GR 1 DD.  EF 60 to 65%.  No R WMA.  Trace AI.  No AS   Family History  Problem Relation Age of Onset  . Heart failure Mother   .  Cancer Mother   . Hypertension Mother   . Hypertension Father   . Heart attack Father        Noted on cardiology intake form as "massive heart attack" at age 77  . Heart failure Sister   . Heart attack Sister        Not listed on her cardiology intake form  . Breast cancer Sister   . Heart failure Sister   . Heart attack Sister        Not listed on her cardiology intake  . Cancer Sister   . Breast cancer Sister    Social History   Socioeconomic History  . Marital status: Single    Spouse name: Not on file  . Number of children: 1  . Years of education: Not on file  . Highest education level: Not on file  Occupational History  . Occupation: Retired    Fish farm manager: RETIRED    CommentAudiological scientist   Tobacco Use  . Smoking status: Never Smoker  . Smokeless tobacco: Never Used  Vaping Use  . Vaping Use: Never used  Substance and Sexual Activity  . Alcohol use: No  . Drug use: No  . Sexual activity: Not Currently  Other Topics  Concern  . Not on file  Social History Narrative   Relocated from [place]   She is currently "'.  She lives with her son (who is autistic).  She herself is disabled, and not working.   She never smoked and never drank alcohol.   She enjoys walking 7 days a week.   Social Determinants of Health   Financial Resource Strain: Low Risk   . Difficulty of Paying Living Expenses: Not hard at all  Food Insecurity: No Food Insecurity  . Worried About Charity fundraiser in the Last Year: Never true  . Ran Out of Food in the Last Year: Never true  Transportation Needs: No Transportation Needs  . Lack of Transportation (Medical): No  . Lack of Transportation (Non-Medical): No  Physical Activity: Insufficiently Active  . Days of Exercise per Week: 7 days  . Minutes of Exercise per Session: 10 min  Stress: No Stress Concern Present  . Feeling of Stress : Not at all  Social Connections: Not on file    Tobacco Counseling Counseling given: Not Answered   Clinical Intake:  Pre-visit preparation completed: Yes  Pain : 0-10 Pain Score: 10-Worst pain ever Pain Type: Chronic pain Pain Location: Knee Pain Orientation: Left Pain Descriptors / Indicators: Aching,Burning,Sharp Pain Onset: More than a month ago Pain Frequency: Constant     Nutritional Status: BMI > 30  Obese Nutritional Risks: None Diabetes: No  How often do you need to have someone help you when you read instructions, pamphlets, or other written materials from your doctor or pharmacy?: 1 - Never What is the last grade level you completed in school?: 11th grade  Diabetic? no  Interpreter Needed?: No  Information entered by :: NAllen LPN   Activities of Daily Living In your present state of health, do you have any difficulty performing the following activities: 08/18/2020 08/18/2020  Hearing? N N  Vision? N N  Difficulty concentrating or making decisions? N N  Walking or climbing stairs? Y N  Dressing or  bathing? N N  Doing errands, shopping? Y N  Preparing Food and eating ? N -  Using the Toilet? N -  In the past six months, have you accidently leaked urine? Y -  Comment if laugh too hard -  Do you have problems with loss of bowel control? N -  Managing your Medications? N -  Managing your Finances? N -  Housekeeping or managing your Housekeeping? Y -  Some recent data might be hidden    Patient Care Team: [name], FNP as PCP - General ([place]) Leonie Man, MD as PCP - Cardiology (Cardiology)  Indicate any recent Medical Services you may have received from other than [facility] providers in the past year (date may be approximate).     Assessment:   This is a routine wellness examination for [employer].  Hearing/Vision screen No exam data present  Dietary issues and exercise activities discussed: Current Exercise Habits: Home exercise routine, Time (Minutes): 10, Frequency (Times/Week): 7, Weekly Exercise (Minutes/Week): 70  Goals    . Patient Stated     08/13/2019, no goals    . Patient Stated     08/18/2020, drink more water      Depression Screen PHQ 2/9 Scores 08/18/2020 08/18/2020 08/13/2019 07/01/2019 03/31/2019 12/26/2018 12/11/2018  PHQ - 2 Score 0 0 0 0 3 0 0  PHQ- 9 Score - - 0 - 6 - -    Fall Risk Fall Risk  08/18/2020 08/18/2020 08/13/2019 07/01/2019 03/31/2019  Falls in the past year? 0 0 0 0 0  Number falls in past yr: - - - - -  Injury with Fall? - - - - -  Risk for fall due to : Medication side effect - Impaired balance/gait;Impaired mobility;Medication side effect - -  Follow up Falls evaluation completed;Education provided;Falls prevention discussed - Falls evaluation completed;Education provided;Falls prevention discussed - -    FALL RISK PREVENTION PERTAINING TO THE HOME:  Any stairs in or around the home? No  If so, are there any without handrails? n/a Home free of loose throw rugs in walkways, pet beds, electrical cords, etc? Yes   Adequate lighting in your home to reduce risk of falls? Yes   ASSISTIVE DEVICES UTILIZED TO PREVENT FALLS:  Life alert? Yes  Use of a cane, walker or w/c? Yes  Grab bars in the bathroom? No  Shower chair or bench in shower? Yes  Elevated toilet seat or a handicapped toilet? No   TIMED UP AND GO:  Was the test performed? No .     Gait slow and steady with assistive device  Cognitive Function:     6CIT Screen 08/18/2020 08/13/2019 07/30/2018  What Year? 0 points 0 points 0 points  What month? 0 points 0 points 0 points  What time? 0 points 0 points 0 points  Count back from 20 0 points 0 points 0 points  Months in reverse 0 points 0 points 0 points  Repeat phrase 0 points 0 points 0 points  Total Score 0 0 0    Immunizations Immunization History  Administered Date(s) Administered  . Fluad Quad(high Dose 65+) 05/25/2020  . Influenza, High Dose Seasonal PF 07/01/2019  . Influenza-Unspecified 05/13/2018  . PFIZER SARS-COV-2 Vaccination 09/23/2019, 10/24/2019, 06/16/2020  . Pneumococcal Conjugate-13 09/03/2018  . Tdap 11/16/2017  . Zoster Recombinat (Shingrix) 09/04/2016, 02/02/2017    TDAP status: Up to date  Flu Vaccine status: Up to date  Pneumococcal vaccine status: Up to date  Covid-19 vaccine status: Completed vaccines  Qualifies for Shingles Vaccine? Yes   Zostavax completed No   Shingrix Completed?: Yes  Screening Tests Health Maintenance  Topic Date Due  . TETANUS/TDAP  11/17/2027  . INFLUENZA VACCINE  Completed  . DEXA SCAN  Completed  . COVID-19 Vaccine  Completed  . Hepatitis C Screening  Completed  . PNA vac Low Risk Adult  Completed    Health Maintenance  There are no preventive care reminders to display for this patient.  Colorectal cancer screening: Type of screening: Colonoscopy. Completed 12/06/2017. Repeat every 5 years  Mammogram status: No longer required due to age.  Bone Density status: Completed 10/10/2018. Results reflect: Bone  density results: NORMAL. Repeat every 0 years.  Lung Cancer Screening: (Low Dose CT Chest recommended if Age 72-80 years, 30 pack-year currently smoking OR have quit w/in 15years.) does not qualify.   Lung Cancer Screening Referral: no  Additional Screening:  Hepatitis C Screening: does qualify; Completed 07/01/2019  Vision Screening: Recommended annual ophthalmology exams for early detection of glaucoma and other disorders of the eye. Is the patient up to date with their annual eye exam?  Yes  Who is the provider or what is the name of the office in which the patient attends annual eye exams? Dr. Katy Fitch If pt is not established with a provider, would they like to be referred to a provider to establish care? No .   Dental Screening: Recommended annual dental exams for proper oral hygiene  Community Resource Referral / Chronic Care Management: CRR required this visit?  No   CCM required this visit?  No      Plan:     I have personally reviewed and noted the following in the patient's chart:   . Medical and social history . Use of alcohol, tobacco or illicit drugs  . Current medications and supplements . Functional ability and status . Nutritional status . Physical activity . Advanced directives . List of other physicians . Hospitalizations, surgeries, and ER visits in previous 12 months . Vitals . Screenings to include cognitive, depression, and falls . Referrals and appointments  In addition, I have reviewed and discussed with patient certain preventive protocols, quality metrics, and best practice recommendations. A written personalized care plan for preventive services as well as general preventive health recommendations were provided to patient.     HALIMA FOGAL, LPN   08/27/8249   Nurse Notes:

## 2020-09-29 ENCOUNTER — Encounter: Payer: Self-pay | Admitting: Cardiology

## 2020-09-29 ENCOUNTER — Telehealth (INDEPENDENT_AMBULATORY_CARE_PROVIDER_SITE_OTHER): Payer: Medicare HMO | Admitting: Cardiology

## 2020-09-29 ENCOUNTER — Telehealth: Payer: Self-pay | Admitting: *Deleted

## 2020-09-29 VITALS — Ht 65.0 in | Wt 205.0 lb

## 2020-09-29 DIAGNOSIS — M7989 Other specified soft tissue disorders: Secondary | ICD-10-CM | POA: Diagnosis not present

## 2020-09-29 DIAGNOSIS — I10 Essential (primary) hypertension: Secondary | ICD-10-CM

## 2020-09-29 DIAGNOSIS — I471 Supraventricular tachycardia, unspecified: Secondary | ICD-10-CM

## 2020-09-29 DIAGNOSIS — I2510 Atherosclerotic heart disease of native coronary artery without angina pectoris: Secondary | ICD-10-CM | POA: Diagnosis not present

## 2020-09-29 DIAGNOSIS — E782 Mixed hyperlipidemia: Secondary | ICD-10-CM | POA: Diagnosis not present

## 2020-09-29 DIAGNOSIS — R0989 Other specified symptoms and signs involving the circulatory and respiratory systems: Secondary | ICD-10-CM

## 2020-09-29 MED ORDER — FUROSEMIDE 20 MG PO TABS: 20.0000 mg | ORAL_TABLET | Freq: Every day | ORAL | 3 refills | Status: DC

## 2020-09-29 MED ORDER — AMLODIPINE BESYLATE 2.5 MG PO TABS: 2.5000 mg | ORAL_TABLET | Freq: Every evening | ORAL | 3 refills | Status: DC

## 2020-09-29 MED ORDER — EZETIMIBE 10 MG PO TABS: 10.0000 mg | ORAL_TABLET | Freq: Every day | ORAL | 3 refills | Status: DC

## 2020-09-29 NOTE — Telephone Encounter (Signed)
RN spoke to patient. Instruction were given  from today's virtual visit 09/29/20 .  AVS SUMMARY has been mailed with labslip.  Prescription e-sent to pharmacy. Carotid and lexiscan myoview ordered  Patient is aware   Follow up appointment schedule for march 2022.Marland Kitchen   Patient verbalized understanding

## 2020-09-29 NOTE — Patient Instructions (Addendum)
Medication Instructions:    It is okay to continue the simvastatin, but you can still use the ezetimibe (Zetia) with simvastatin.  Please start the ezetimibe/Zetia as prescribed (10 mg by mouth daily)  Increase your furosemide dose to 40 mg (2 tablets) daily for the next week, then every other day a week.  After that go back to the 20 mg daily with additional dose as needed for additional weight gain or swelling  New medicine: Amlodipine 2.5 mg by mouth daily every evening (Rx: Dispense #90 tab, 3 refill)   *If you need a refill on your cardiac medications before your next appointment, please call your pharmacy*   Lab Work: BNP, BMP, D- dimer  Their primary care doctor has asked to be checked a D-dimer which is a blood work study to determine if you may have a blood clot (if it is negative, that indicates you do not have a clot, if it is abnormal, it does not mean you do have a clot because it could be elevated for other reasons.)  I would also like to check a basic metabolic panel and BNP (which is a marker of possible heart failure)  If you have labs (blood work) drawn today and your tests are completely normal, you will receive your results only by: Marland Kitchen MyChart Message (if you have MyChart) OR . A paper copy in the mail If you have any lab test that is abnormal or we need to change your treatment, we will call you to review the results.   Testing/Procedures:   Carotid dopplers --when I reviewed her chart, it indicates that you are due for your carotid Doppler screening to reevaluate carotid artery disease.  [place] Stress Test  Shared Decision Making/Informed Consent The risks [chest pain, shortness of breath, cardiac arrhythmias, dizziness, blood pressure fluctuations, myocardial infarction, stroke/transient ischemic attack, nausea, vomiting, allergic reaction, radiation exposure, metallic taste sensation and life-threatening complications (estimated to be 1 in 10,000)],  benefits (risk stratification, diagnosing coronary artery disease, treatment guidance) and alternatives of a nuclear stress test were discussed in detail with Ms. Zenia Resides and she agrees to proceed.   Follow-Up: At [facility], you and your health needs are our priority.  As part of our continuing mission to provide you with exceptional heart care, we have created designated Provider Care Teams.  These Care Teams include your primary Cardiologist (physician) and Advanced Practice Providers (APPs -  Physician Assistants and Nurse Practitioners) who all work together to provide you with the care you need, when you need it.  We recommend signing up for the patient portal called "MyChart".  Sign up information is provided on this After Visit Summary.  MyChart is used to connect with patients for Virtual Visits (Telemedicine).  Patients are able to view lab/test results, encounter notes, upcoming appointments, etc.  Non-urgent messages can be sent to your provider as well.   To learn more about what you can do with MyChart, go to [email or web address].    Your next appointment:   2 month(s)  The format for your next appointment:   In Person  Provider:   Glenetta Hew, MD   Other Instructions [place]!!

## 2020-09-29 NOTE — Telephone Encounter (Signed)
  Patient Consent for Virtual Visit         Peggy [name] has provided verbal consent on 09/29/2020 for a virtual visit (video or telephone).   CONSENT FOR VIRTUAL VISIT FOR:  Peggy [name]  By participating in this virtual visit I agree to the following:  I hereby voluntarily request, consent and authorize [facility] and its employed or contracted physicians, physician assistants, nurse practitioners or other licensed health care professionals (the Practitioner), to provide me with telemedicine health care services (the "Services") as deemed necessary by the treating Practitioner. I acknowledge and consent to receive the Services by the Practitioner via telemedicine. I understand that the telemedicine visit will involve communicating with the Practitioner through live audiovisual communication technology and the disclosure of certain medical information by electronic transmission. I acknowledge that I have been given the opportunity to request an in-person assessment or other available alternative prior to the telemedicine visit and am voluntarily participating in the telemedicine visit.  I understand that I have the right to withhold or withdraw my consent to the use of telemedicine in the course of my care at any time, without affecting my right to future care or treatment, and that the Practitioner or I may terminate the telemedicine visit at any time. I understand that I have the right to inspect all information obtained and/or recorded in the course of the telemedicine visit and may receive copies of available information for a reasonable fee.  I understand that some of the potential risks of receiving the Services via telemedicine include:  Marland Kitchen Delay or interruption in medical evaluation due to technological equipment failure or disruption; . Information transmitted may not be sufficient (e.g. poor resolution of images) to allow for appropriate medical decision making by the Practitioner;  and/or  . In rare instances, security protocols could fail, causing a breach of personal health information.  Furthermore, I acknowledge that it is my responsibility to provide information about my medical history, conditions and care that is complete and accurate to the best of my ability. I acknowledge that Practitioner's advice, recommendations, and/or decision may be based on factors not within their control, such as incomplete or inaccurate data provided by me or distortions of diagnostic images or specimens that may result from electronic transmissions. I understand that the practice of medicine is not an exact science and that Practitioner makes no warranties or guarantees regarding treatment outcomes. I acknowledge that a copy of this consent can be made available to me via my patient portal ([facility]), or I can request a printed copy by calling the office of [facility].    I understand that my insurance will be billed for this visit.   I have read or had this consent read to me. . I understand the contents of this consent, which adequately explains the benefits and risks of the Services being provided via telemedicine.  . I have been provided ample opportunity to ask questions regarding this consent and the Services and have had my questions answered to my satisfaction. . I give my informed consent for the services to be provided through the use of telemedicine in my medical care

## 2020-09-29 NOTE — Progress Notes (Signed)
Virtual Visit via Video Note   This visit type was conducted due to national recommendations for restrictions regarding the COVID-19 Pandemic (e.g. social distancing) in an effort to limit this patient's exposure and mitigate transmission in our community.  Due to her co-morbid illnesses, this patient is at least at moderate risk for complications without adequate follow up.  This format is felt to be most appropriate for this patient at this time.  All issues noted in this document were discussed and addressed.  A limited physical exam was performed with this format.  Please refer to the patient's chart for her consent to telehealth for [facility].  Video Connection Lost Video connection was lost at > 50% of the duration of this visit, at which time the remainder of the visit was completed via audio only.   She was unable to adequately connect to the video program.  When she was working with the nurse, she was not able to get the video to work.  Due to frustration she requested telephone conversation.  Patient has given verbal permission to conduct this visit via virtual appointment and to bill insurance 10/01/2020 12:27 AM     Evaluation Performed:  Follow-up visit  Date:  10/01/2020   ID:  Peggy [name] Dec 19, 1945, MRN [id number]  Patient Location: Home Provider Location: Office/Clinic  PCP:  [name], FNP  Cardiologist:  Maximino Greenland, MD  Electrophysiologist:  None   Chief Complaint:   Chief Complaint  Patient presents with  . Coronary Artery Disease    Coronary calcium on CT; nonobstructive  . Palpitations    History of Present Illness:    Peggy [name] HRNCIR is a 75 y.o. female with PMH notable for CORONARY ARTERY CALCIFICATION/NONOBSTRUCTIVE CAD (moderate proximal LAD and LCx disease with coronary calcium score 131) who presents via audio/video conferencing for a telehealth visit today.   Peggy [name] was last seen April 29, 2020 to follow-up results with  monitor and reevaluate chest pain goes described as sharp discomfort occurring in the spells that may or may be more consistent with short bursts of SVT on monitor (multiple-245, short burst ranging from 4-20 beats of SVT/PAT ranging from 134 to 31 bpm.  He described that his name is sensations they are not associated with physical activity -> mostly occurring at rest.  Not lasting more than 20 to 30 seconds.  Mild orthopnea but no PND mild leg swelling, but more related to knee pain.  Taking Lasix every day with much twice weekly PRN dose.  (She was grieving her sister's death.)  Hospitalizations:  . None   Recent - Interim CV studies:   The following studies were reviewed today: . None:  Inerval History   Peggy [name] is being seen today on [birth year] birthday.  She is concerned about worsening swelling-to the point that she has now been taking 40 mg a day Lasix for the last couple days without significant improvement.  She is still has the occasional tightness in her chest that there at random times.  It can occur both at rest and with exertion.  She also has some orthopnea symptoms but no real PND.  She feels heaviness and fullness in her legs with notable discomfort and worsening swelling.  PCP asked if we could check a D-dimer..  She stated that she was having cramping with the rosuvastatin so she switched back to simvastatin.  She also mentions that her pharmacist told her not to take Zetia with  simvastatin, so she is not taking it.   Says - palpitations are "about the same" - ? Lasting minutes - but "does not know"  Cardiovascular ROS: positive for - chest pain, dyspnea on exertion, edema, irregular heartbeat, orthopnea, palpitations, rapid heart rate and Legs heaviness and fullness.  Tightness in her chest that is worse with lying down negative for - paroxysmal nocturnal dyspnea, shortness of breath or Syncope or near syncope, TIA/amaurosis fugax or claudication.  ROS:  Please see the history  of present illness.    The patient does not have symptoms concerning for COVID-19 infection (fever, chills, cough, or new shortness of breath).  Review of Systems  Constitutional: Positive for malaise/fatigue (notes feeling a little tired). Negative for weight loss.  HENT: Positive for congestion (She feels a sense of congestion).   Respiratory: Positive for shortness of breath (Mostly exertional). Negative for cough and wheezing.   Cardiovascular: Positive for chest pain, palpitations and leg swelling.  Gastrointestinal: Negative for blood in stool, diarrhea and melena.  Genitourinary: Negative for frequency and hematuria.  Musculoskeletal: Positive for joint pain (Knees). Negative for falls.  Neurological: Negative for dizziness, focal weakness and weakness.  Psychiatric/Behavioral: Negative for memory loss. The patient is nervous/anxious and has insomnia (Wakes up to urinate).      The patient is practicing social distancing.  Past Medical History:  Diagnosis Date  . Anxiety   . Arthritis    "all over"  . Asthma   . Bladder cancer ([place])   . Chronic bronchitis ([place])    'get it q yr"  . Chronic lower back pain   . Coronary artery disease, non-occlusive 2005   Cardiac catheter 2005 and 2012 for "anginal symptoms "showed angiographically normal coronary arteries. -->  Negative Myoview June 2016.  Coronary calcification noted on chest CT -> calcium score 131, moderate proximal LAD and LCx disease on coronary CTA - (2019)  . Daily headache   . Deafness in left ear   . Depression   . DVT (deep venous thrombosis) ([place]) 2009/2010   "probably left leg"   . GERD (gastroesophageal reflux disease)   . History of kidney stones   . Hyperlipidemia with target LDL less than 100   . Hypertension   . Nummular eczema   . Skin cancer    "right abdomen"    Past Surgical History:  Procedure Laterality Date  . ABDOMINAL HYSTERECTOMY  1980's  . APPENDECTOMY Right   . BACK SURGERY     x 2,  lower  . BLADDER TUMOR EXCISION    . BREAST BIOPSY Right   . BREAST EXCISIONAL BIOPSY Right   . CAROTID ARTERY DOPPLERS   10/2017   Carotid Dopplers: 40-59% stenosis on the Right Internal Carotid, < 40% stenosis in the Left. Both vertebral arteries show normal flow. Left subclavian artery is normal, but there is some flow disturbance in the right subclavian artery.  . COLONOSCOPY WITH PROPOFOL N/A 02/26/2015   Procedure: COLONOSCOPY WITH PROPOFOL;  Surgeon: Carol Ada, MD;  Location: [facility];  Service: Endoscopy;  Laterality: N/A;  . COLONOSCOPY WITH PROPOFOL N/A 12/06/2017   Procedure: COLONOSCOPY WITH PROPOFOL;  Surgeon: Juanita Craver, MD;  Location: [facility];  Service: Endoscopy;  Laterality: N/A;  . CORONARY CALCIUM SCORE & CT ANGIOGRAM  11/2017   Coronary calcium score 131 (moderate risk).  Moderate proximal LAD & LCx stenosis --> LOW RISK STUDY.  No evidence to suspect cardiac related chest pain --> correlates with cardiac catheterizations  .  ENTEROSCOPY N/A 12/14/2017   Procedure: ENTEROSCOPY;  Surgeon: Carol Ada, MD;  Location: [facility];  Service: Endoscopy;  Laterality: N/A;  . ESOPHAGOGASTRODUODENOSCOPY Left 02/20/2015   Procedure: ESOPHAGOGASTRODUODENOSCOPY (EGD);  Surgeon: Milus Banister, MD;  Location: [facility];  Service: Endoscopy;  Laterality: Left;  . ESOPHAGOGASTRODUODENOSCOPY (EGD) WITH PROPOFOL N/A 12/06/2017   Procedure: ESOPHAGOGASTRODUODENOSCOPY (EGD) WITH PROPOFOL;  Surgeon: Juanita Craver, MD;  Location: [facility];  Service: Endoscopy;  Laterality: N/A;  . HOT HEMOSTASIS N/A 12/14/2017   Procedure: HOT HEMOSTASIS (ARGON PLASMA COAGULATION/BICAP);  Surgeon: Carol Ada, MD;  Location: [facility];  Service: Endoscopy;  Laterality: N/A;  . JOINT REPLACEMENT    . KNEE ARTHROSCOPY Right ~ 2000  . LEFT HEART CATH AND CORONARY ANGIOGRAPHY  11/'05; 2/'12   Angiographically normal coronary arteries  . LUMBAR DISC SURGERY  1980's X 1; ~ 2002  . MEDIAL  PARTIAL KNEE REPLACEMENT Right 2001  . NM MYOVIEW LTD  02/2015   EF 65%.  No reversible ischemia.  Possible scar along the cardiac apex and anteroseptal wall.  (Mild severity, medium-sized defect in the apex anteroseptal wall noted on both resting and stress images).  No inducible ischemia.  Read as LOW RISK  . PARTIAL KNEE ARTHROPLASTY Left 2009  . REPLACEMENT TOTAL KNEE BILATERAL Bilateral   . REVISION TOTAL KNEE ARTHROPLASTY Left   . SKIN CANCER EXCISION Right    "abdomen"  . TRANSTHORACIC ECHOCARDIOGRAM  06/'16; 2/'19; 1/'21    A) mod Conc LVH. EF 60-65%.  No RWMA. GR 1 D. Mild Ao Stenosis. Mild LA dilation.;  B) EF 60-65%.  Mild LVH.  No or W MA.  No significant valve disease.;l 01/2020: Normal LV systolic function.  Mild LVH.  GR 1 DD.  EF 60 to 65%.  No R WMA.  Trace AI.  No AS     Current Meds  Medication Sig  . acetaminophen (TYLENOL) 500 MG tablet Take 1,000 mg by mouth 2 (two) times daily as needed for moderate pain or headache.   Marland Kitchen amLODipine (NORVASC) 2.5 MG tablet Take 1 tablet (2.5 mg total) by mouth every evening.  . diclofenac sodium (VOLTAREN) 1 % GEL Apply 2 g topically 4 (four) times daily.  . diphenhydrAMINE (BENADRYL) 25 mg capsule Take 25 mg by mouth daily as needed for allergies.  Marland Kitchen FLUoxetine (PROZAC) 40 MG capsule Take 40 mg by mouth daily as needed (depression).  . metoprolol succinate (TOPROL XL) 25 MG 24 hr tablet Take 1 tablet (25 mg total) by mouth daily.  . NYSTATIN powder APPLY TOPICALLY 4 TIMES A DAY  . simvastatin (ZOCOR) 80 MG tablet Take 80 mg by mouth daily.  . trandolapril (MAVIK) 2 MG tablet Take 1 tablet (2 mg total) by mouth daily.  . valACYclovir (VALTREX) 1000 MG tablet TAKE 1/2 TABLET BY MOUTH THREE TIMES DAILY AS NEEDED FOR OUTBREAK  . [DISCONTINUED] furosemide (LASIX) 20 MG tablet Take 1 tablet (20 mg total) by mouth as needed. For shortness of breathe or edema( swelling)     Allergies:   Penicillins, Azithromycin, Levaquin [levofloxacin in  d5w], Clindamycin/lincomycin, Erythromycin base, Keflex [cephalexin], Sulfa antibiotics, and Tetracyclines & related   Social History   Tobacco Use  . Smoking status: Never Smoker  . Smokeless tobacco: Never Used  Vaping Use  . Vaping Use: Never used  Substance Use Topics  . Alcohol use: No  . Drug use: No     Family Hx: The patient's family history includes Breast cancer in her sister  and sister; Cancer in her mother and sister; Heart attack in her father, sister, and sister; Heart failure in her mother, sister, and sister; Hypertension in her father and mother.   Labs/Other Tests and Data Reviewed:    EKG:  No ECG reviewed.  Recent Labs: 03/31/2020: Hemoglobin 11.1; Platelets 385 08/18/2020: ALT 10 09/30/2020: BNP WILL FOLLOW; BUN 7; Creatinine, Ser 0.80; Potassium 3.6; Sodium 142   Recent Lipid Panel Lab Results  Component Value Date/Time   CHOL 167 08/18/2020 10:41 AM   TRIG 127 08/18/2020 10:41 AM   HDL 70 08/18/2020 10:41 AM   CHOLHDL 2.4 08/18/2020 10:41 AM   CHOLHDL 4.2 12/10/2012 11:32 AM   LDLCALC 75 08/18/2020 10:41 AM    Wt Readings from Last 3 Encounters:  09/29/20 205 lb (93 kg)  08/18/20 206 lb (93.4 kg)  08/18/20 206 lb (93.4 kg)     Objective:    Vital Signs:  Ht 5\' 5"  (1.651 m)   Wt 205 lb (93 kg)   BMI 34.11 kg/m   VITAL SIGNS:  reviewed Pleasant female in no acute distress. A&O x 3.  Normal Mood & Affect Non-labored respirations  ASSESSMENT & PLAN:    Problem List Items Addressed This Visit    Coronary artery disease, non-occlusive - Primary (Chronic)    She has had multiple evaluations of her coronary disease most recently with a coronary CTA.  Coronary calcium score not that high, and the LAD/LCx disease was not flow-limiting.  Despite this, she continues to feel this tightness in her chest intermittently.  Cannot exclude microvascular disease, but also need to ensure that her LAD and LCx disease is not significant now.  Plan: Check  Lexiscan Myoview  Add amlodipine 2.5 mg daily for possible microvascular disease.  Continue beta-blocker and ARB along with statin.  For now I think she is okay being off of aspirin, but would restart if the stress test is abnormal.       Relevant Medications   simvastatin (ZOCOR) 80 MG tablet   amLODipine (NORVASC) 2.5 MG tablet   furosemide (LASIX) 20 MG tablet   ezetimibe (ZETIA) 10 MG tablet   Other Relevant Orders   VAS US CAROTID   MYOCARDIAL PERFUSION IMAGING   D-dimer, quantitative (not at [facility]) (Completed)   Brain natriuretic peptide (Completed)   Basic metabolic panel (Completed)   Essential hypertension (Chronic)    Her blood pressure been pretty well controlled with Mavik along with low-dose Toprol.  With her persistent chest discomfort and the concern for possible microvascular disease, I am going to add low-dose amlodipine for microvascular disease.  Add amlodipine 2.5 mg nightly.      Relevant Medications   simvastatin (ZOCOR) 80 MG tablet   amLODipine (NORVASC) 2.5 MG tablet   furosemide (LASIX) 20 MG tablet   ezetimibe (ZETIA) 10 MG tablet   Mixed hyperlipidemia (Chronic)    Lipids are followed by PCP. Most recent LDL was 75 as of December.  We had sent the prescription for ezetimibe and rosuvastatin.  She was not able to tolerate rosuvastatin, and mention that she just felt horrible aches and pains.  Went back to taking simvastatin.  However, she was told (incorrectly) that she cannot take ezetimibe with simvastatin. We refilled her simvastatin, and asked that she starts the Zetia 10 mg daily.  Follow-up labs per PCP.      Relevant Medications   simvastatin (ZOCOR) 80 MG tablet   amLODipine (NORVASC) 2.5 MG tablet   furosemide (LASIX)  20 MG tablet   ezetimibe (ZETIA) 10 MG tablet   Bilateral carotid bruits (Chronic)    Based on her 2020 Dopplers, they recommended annual follow-up, however felt it was more appropriate to do at least every 2 years as  opposed to every 1 year. We will follow up carotid Dopplers for this year. Continue risk factor modification for vascular disease.      Relevant Orders   VAS US CAROTID   PSVT (paroxysmal supraventricular tachycardia) (HCC) (Chronic)    She had a few bursts noted on monitor.  She was very symptomatic.  These are notably improved with beta-blocker.  Continue current dose of Toprol.      Relevant Medications   simvastatin (ZOCOR) 80 MG tablet   amLODipine (NORVASC) 2.5 MG tablet   furosemide (LASIX) 20 MG tablet   ezetimibe (ZETIA) 10 MG tablet   Other Relevant Orders   D-dimer, quantitative (not at [facility]) (Completed)   Brain natriuretic peptide (Completed)   Basic metabolic panel (Completed)   Leg swelling    She still is having leg swelling off and on.  Does not seem to be worse on one side than the other, however she is complaining of leg fullness and now dyspnea.  Per PCP request, we will check a D-dimer to exclude DVT.  I will also check a BNP to determine if there is a CHF component. We will add CMP with her being on ARB..      Relevant Orders   Brain natriuretic peptide (Completed)      COVID-19 Education: The signs and symptoms of COVID-19 were discussed with the patient and how to seek care for testing (follow up with PCP or arrange E-visit).   The importance of social distancing was discussed today.  Time:   Today, I have spent 18 minutes with the patient with telehealth technology discussing the above problems.   12  Min charting == 30 Total min   Medication Adjustments/Labs and Tests Ordered: Current medicines are reviewed at length with the patient today.  Concerns regarding medicines are outlined above.   Patient Instructions  Medication Instructions:    It is okay to continue the simvastatin, but you can still use the ezetimibe (Zetia) with simvastatin.  Please start the ezetimibe/Zetia as prescribed (10 mg by mouth daily)  Increase your furosemide  dose to 40 mg (2 tablets) daily for the next week, then every other day a week.  After that go back to the 20 mg daily with additional dose as needed for additional weight gain or swelling  New medicine: Amlodipine 2.5 mg by mouth daily every evening (Rx: Dispense #90 tab, 3 refill)   *If you need a refill on your cardiac medications before your next appointment, please call your pharmacy*   Lab Work: BNP, BMP, D- dimer  Their primary care doctor has asked to be checked a D-dimer which is a blood work study to determine if you may have a blood clot (if it is negative, that indicates you do not have a clot, if it is abnormal, it does not mean you do have a clot because it could be elevated for other reasons.)  I would also like to check a basic metabolic panel and BNP (which is a marker of possible heart failure)  If you have labs (blood work) drawn today and your tests are completely normal, you will receive your results only by: Marland Kitchen MyChart Message (if you have MyChart) OR . A paper copy in the  mail If you have any lab test that is abnormal or we need to change your treatment, we will call you to review the results.   Testing/Procedures:   Carotid dopplers --when I reviewed her chart, it indicates that you are due for your carotid Doppler screening to reevaluate carotid artery disease.  Romeo Stress Test  Shared Decision Making/Informed Consent The risks [chest pain, shortness of breath, cardiac arrhythmias, dizziness, blood pressure fluctuations, myocardial infarction, stroke/transient ischemic attack, nausea, vomiting, allergic reaction, radiation exposure, metallic taste sensation and life-threatening complications (estimated to be 1 in 10,000)], benefits (risk stratification, diagnosing coronary artery disease, treatment guidance) and alternatives of a nuclear stress test were discussed in detail with Ms. Zenia Resides and she agrees to proceed.   Follow-Up: At [facility],  you and your health needs are our priority.  As part of our continuing mission to provide you with exceptional heart care, we have created designated Provider Care Teams.  These Care Teams include your primary Cardiologist (physician) and Advanced Practice Providers (APPs -  Physician Assistants and Nurse Practitioners) who all work together to provide you with the care you need, when you need it.  We recommend signing up for the patient portal called "MyChart".  Sign up information is provided on this After Visit Summary.  MyChart is used to connect with patients for Virtual Visits (Telemedicine).  Patients are able to view lab/test results, encounter notes, upcoming appointments, etc.  Non-urgent messages can be sent to your provider as well.   To learn more about what you can do with MyChart, go to [email or web address].    Your next appointment:   2 month(s)  The format for your next appointment:   In Person  Provider:   Glenetta Hew, MD   Other Instructions [place]!!    Signed, Glenetta Hew, MD  10/01/2020 12:27 AM    [place]

## 2020-09-30 DIAGNOSIS — M7989 Other specified soft tissue disorders: Secondary | ICD-10-CM | POA: Diagnosis not present

## 2020-09-30 DIAGNOSIS — I471 Supraventricular tachycardia: Secondary | ICD-10-CM | POA: Diagnosis not present

## 2020-09-30 DIAGNOSIS — I2510 Atherosclerotic heart disease of native coronary artery without angina pectoris: Secondary | ICD-10-CM | POA: Diagnosis not present

## 2020-10-01 LAB — BASIC METABOLIC PANEL
BUN/Creatinine Ratio: 9 — ABNORMAL LOW (ref 12–28)
BUN: 7 mg/dL — ABNORMAL LOW (ref 8–27)
CO2: 24 mmol/L (ref 20–29)
Calcium: 8.8 mg/dL (ref 8.7–10.3)
Chloride: 102 mmol/L (ref 96–106)
Creatinine, Ser: 0.8 mg/dL (ref 0.57–1.00)
GFR calc Af Amer: 82 mL/min/{1.73_m2} (ref >59)
GFR calc non Af Amer: 71 mL/min/{1.73_m2} (ref >59)
Glucose: 93 mg/dL (ref 65–99)
Potassium: 3.6 mmol/L (ref 3.5–5.2)
Sodium: 142 mmol/L (ref 134–144)

## 2020-10-01 LAB — D-DIMER, QUANTITATIVE: D-DIMER: 1.58 mg/L FEU — ABNORMAL HIGH (ref 0.00–0.49)

## 2020-10-01 LAB — BRAIN NATRIURETIC PEPTIDE: BNP: 35 pg/mL (ref 0.0–100.0)

## 2020-10-01 NOTE — Assessment & Plan Note (Signed)
Her blood pressure been pretty well controlled with Mavik along with low-dose Toprol.  With her persistent chest discomfort and the concern for possible microvascular disease, I am going to add low-dose amlodipine for microvascular disease.  Add amlodipine 2.5 mg nightly.

## 2020-10-01 NOTE — Assessment & Plan Note (Signed)
Lipids are followed by PCP. Most recent LDL was 75 as of December.  We had sent the prescription for ezetimibe and rosuvastatin.  She was not able to tolerate rosuvastatin, and mention that she just felt horrible aches and pains.  Went back to taking simvastatin.  However, she was told (incorrectly) that she cannot take ezetimibe with simvastatin. We refilled her simvastatin, and asked that she starts the Zetia 10 mg daily.  Follow-up labs per PCP.

## 2020-10-01 NOTE — Assessment & Plan Note (Signed)
She has had multiple evaluations of her coronary disease most recently with a coronary CTA.  Coronary calcium score not that high, and the LAD/LCx disease was not flow-limiting.  Despite this, she continues to feel this tightness in her chest intermittently.  Cannot exclude microvascular disease, but also need to ensure that her LAD and LCx disease is not significant now.  Plan: Check Lexiscan Myoview  Add amlodipine 2.5 mg daily for possible microvascular disease.  Continue beta-blocker and ARB along with statin.  For now I think she is okay being off of aspirin, but would restart if the stress test is abnormal.

## 2020-10-01 NOTE — Assessment & Plan Note (Signed)
Based on her 2020 Dopplers, they recommended annual follow-up, however felt it was more appropriate to do at least every 2 years as opposed to every 1 year. We will follow up carotid Dopplers for this year. Continue risk factor modification for vascular disease.

## 2020-10-01 NOTE — Assessment & Plan Note (Signed)
She still is having leg swelling off and on.  Does not seem to be worse on one side than the other, however she is complaining of leg fullness and now dyspnea.  Per PCP request, we will check a D-dimer to exclude DVT.  I will also check a BNP to determine if there is a CHF component. We will add CMP with her being on ARB.Marland Kitchen

## 2020-10-01 NOTE — Assessment & Plan Note (Signed)
She had a few bursts noted on monitor.  She was very symptomatic.  These are notably improved with beta-blocker.  Continue current dose of Toprol.

## 2020-10-05 DIAGNOSIS — F331 Major depressive disorder, recurrent, moderate: Secondary | ICD-10-CM | POA: Diagnosis not present

## 2020-10-05 DIAGNOSIS — F419 Anxiety disorder, unspecified: Secondary | ICD-10-CM | POA: Diagnosis not present

## 2020-10-06 ENCOUNTER — Telehealth: Payer: Self-pay | Admitting: *Deleted

## 2020-10-06 DIAGNOSIS — M7989 Other specified soft tissue disorders: Secondary | ICD-10-CM

## 2020-10-06 DIAGNOSIS — R7989 Other specified abnormal findings of blood chemistry: Secondary | ICD-10-CM

## 2020-10-06 NOTE — Telephone Encounter (Signed)
-----   Message from Leonie Man, MD sent at 10/04/2020  9:35 PM EST ----- D-dimer level is a little elevated.  This does not confirm or deny presence of a clot.  (A normal D-dimer level would indicate no clot).  Chemistry panel looks good with normal kidney function.  Let's add lower extremity venous Dopplers to her carotid Doppler date.   Peggy [name]

## 2020-10-06 NOTE — Telephone Encounter (Signed)
RN spoke to vascular scheduler- able to added Lower extremity venous dopplers ( DVT) to test 10/07/20.  The patient has been notified of the result and verbalized understanding.  All questions (if any) were answered.  Raiford Simmonds, RN 10/06/2020 3:17 PM

## 2020-10-07 ENCOUNTER — Other Ambulatory Visit: Payer: Self-pay

## 2020-10-07 ENCOUNTER — Ambulatory Visit (HOSPITAL_BASED_OUTPATIENT_CLINIC_OR_DEPARTMENT_OTHER)
Admission: RE | Admit: 2020-10-07 | Discharge: 2020-10-07 | Disposition: A | Discharge status: Home / Self Care | Payer: Medicare HMO | Source: Ambulatory Visit | Attending: Cardiology | Admitting: Cardiology

## 2020-10-07 ENCOUNTER — Ambulatory Visit (HOSPITAL_COMMUNITY)
Admission: RE | Admit: 2020-10-07 | Discharge: 2020-10-07 | Disposition: A | Discharge status: Home / Self Care | Payer: Medicare HMO | Source: Ambulatory Visit | Attending: Cardiovascular Disease | Admitting: Cardiovascular Disease

## 2020-10-07 DIAGNOSIS — M7989 Other specified soft tissue disorders: Secondary | ICD-10-CM

## 2020-10-07 DIAGNOSIS — R0989 Other specified symptoms and signs involving the circulatory and respiratory systems: Secondary | ICD-10-CM | POA: Diagnosis not present

## 2020-10-07 DIAGNOSIS — R7989 Other specified abnormal findings of blood chemistry: Secondary | ICD-10-CM | POA: Insufficient documentation

## 2020-10-07 DIAGNOSIS — I2510 Atherosclerotic heart disease of native coronary artery without angina pectoris: Secondary | ICD-10-CM | POA: Diagnosis not present

## 2020-10-19 ENCOUNTER — Other Ambulatory Visit: Payer: Self-pay | Admitting: Nurse Practitioner

## 2020-11-01 DIAGNOSIS — T84093D Other mechanical complication of internal left knee prosthesis, subsequent encounter: Secondary | ICD-10-CM | POA: Diagnosis not present

## 2020-11-02 DIAGNOSIS — T8450XA Infection and inflammatory reaction due to unspecified internal joint prosthesis, initial encounter: Secondary | ICD-10-CM | POA: Diagnosis not present

## 2020-11-08 ENCOUNTER — Other Ambulatory Visit: Payer: Self-pay | Admitting: Nurse Practitioner

## 2020-11-08 DIAGNOSIS — I10 Essential (primary) hypertension: Secondary | ICD-10-CM

## 2020-11-25 ENCOUNTER — Ambulatory Visit (INDEPENDENT_AMBULATORY_CARE_PROVIDER_SITE_OTHER): Payer: Medicare HMO | Admitting: Cardiology

## 2020-11-25 ENCOUNTER — Encounter: Payer: Self-pay | Admitting: Cardiology

## 2020-11-25 ENCOUNTER — Other Ambulatory Visit: Payer: Self-pay

## 2020-11-25 VITALS — BP 122/82 | HR 73 | Ht 65.0 in | Wt 205.2 lb

## 2020-11-25 DIAGNOSIS — R079 Chest pain, unspecified: Secondary | ICD-10-CM

## 2020-11-25 DIAGNOSIS — I471 Supraventricular tachycardia: Secondary | ICD-10-CM

## 2020-11-25 DIAGNOSIS — I10 Essential (primary) hypertension: Secondary | ICD-10-CM

## 2020-11-25 DIAGNOSIS — I2510 Atherosclerotic heart disease of native coronary artery without angina pectoris: Secondary | ICD-10-CM | POA: Diagnosis not present

## 2020-11-25 DIAGNOSIS — E782 Mixed hyperlipidemia: Secondary | ICD-10-CM

## 2020-11-25 NOTE — Patient Instructions (Addendum)
Medication Instructions:  No changes   *If you need a refill on your cardiac medications before your next appointment, please call your pharmacy*   Lab Work: Not needed If you have labs (blood work) drawn today and your tests are completely normal, you will receive your results only by: Marland Kitchen MyChart Message (if you have MyChart) OR . A paper copy in the mail If you have any lab test that is abnormal or we need to change your treatment, we will call you to review the results.   Testing/Procedures:  .will be schedule for [address] Your physician has requested that you have a lexiscan myoview. Please follow instruction sheet, as given.   Follow-Up: At [facility], you and your health needs are our priority.  As part of our continuing mission to provide you with exceptional heart care, we have created designated Provider Care Teams.  These Care Teams include your primary Cardiologist (physician) and Advanced Practice Providers (APPs -  Physician Assistants and Nurse Practitioners) who all work together to provide you with the care you need, when you need it.     Your next appointment:   6 month(s)  The format for your next appointment:   In Person  Provider:   Glenetta Hew, MD   Other Instructions

## 2020-11-25 NOTE — Progress Notes (Signed)
Primary Care Provider: Glendale Chard, MD Cardiologist: Maximino Greenland, MD Electrophysiologist: None  Clinic Note: Chief Complaint  Patient presents with  . Follow-up    Was supposed to be to review results of stress test that was done scheduled.  . Chest Pain    Similar to last visit  . Coronary Artery Disease    ===================================  ASSESSMENT/PLAN   Problem List Items Addressed This Visit    Chest pain at rest - Primary    She has this strange sensation of chest discomfort at rest and with exertion.  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  She has reproducible chest pain on exam.  However, she says this is not the same symptom.  Plan: Lexiscan Myoview.  Need to exclude ischemia.  Shared Decision Making/Informed Consent{ All outpatient stress tests require an informed consent ([id number]) ATTESTATION ORDER  -> order placed The risks [chest pain, shortness of breath, cardiac arrhythmias, dizziness, blood pressure fluctuations, myocardial infarction, stroke/transient ischemic attack, nausea, vomiting, allergic reaction, radiation exposure, metallic taste sensation and life-threatening complications (estimated to be 1 in 10,000)], benefits (risk stratification, diagnosing coronary artery disease, treatment guidance) and alternatives of a nuclear stress test were discussed in detail with Ms. Zenia Resides and she agrees to proceed.      Relevant Orders   EKG 12-Lead (Completed)   Cardiac Stress Test: Informed Consent Details: Physician/Practitioner Attestation; Transcribe to consent form and obtain patient signature   Coronary artery disease, non-occlusive (Chronic)    Minimally elevated coronary calcium score of 131 in 2019.  Negative Myoview in 2016.  She is on beta-blocker for palpitations/SVT, along with ACE inhibitor and the recently added amlodipine for possible microvascular ischemia.  There will help with the  symptoms.  Goal LDL is closer to 70-100.  Currently 75.  Well-controlled.  Plan:  With recurrent chest discomfort symptoms.  I had ordered a nuclear stress test which we will reorder.  Continue calcium blocker, beta-blocker and ACE inhibitor at current doses for now.  May titrate up calcium channel blocker for additional symptom relief  She is back on simvastatin, apparently did not tolerate rosuvastatin and Zetia. ->  As long as her lipid panel looks stable, will continue current doses.      Relevant Orders   Cardiac Stress Test: Informed Consent Details: Physician/Practitioner Attestation; Transcribe to consent form and obtain patient signature   Essential hypertension (Chronic)    Blood pressure well controlled on current medications.  No change.  She does have room to potentially titrate up amlodipine if necessary.      Mixed hyperlipidemia (Chronic)    Last LDL was 75.  Apparently she did not tolerate rosuvastatin and Zetia.  Is back on simvastatin.  For now, continue to monitor.  Labs are being followed by PCP.      Relevant Orders   Cardiac Stress Test: Informed Consent Details: Physician/Practitioner Attestation; Transcribe to consent form and obtain patient signature   Coronary artery calcification seen on CAT scan (Chronic)     Previously nonobstructive CAD-plan Myoview stress test for ischemic evaluation.      Relevant Orders   EKG 12-Lead (Completed)   Cardiac Stress Test: Informed Consent Details: Physician/Practitioner Attestation; Transcribe to consent form and obtain patient signature   PSVT (paroxysmal supraventricular tachycardia) ([place]) (Chronic)    She only had a few short burst noted on her event monitor.  She feels intermittent palpitations which seem to have improved with beta-blocker.  Continue current dose at this point.  Toprol 25 mg daily.  No real breakthrough spells.      Relevant Orders   EKG 12-Lead (Completed)       ===================================  HPI:    ANALA WHISENANT is a 75 y.o. female with a PMH CORONARY ARTERY CALCIFICATION/NONOBSTRUCTIVE CAD (moderate proximal LAD and LCx disease with coronary calcium score 131 brief), atrial runs/PAT who presents today for 28-month follow-up at which time we response to discuss results of her Myoview stress test that was never done.  Never scheduled.Gwenlyn Perking was last seen on 05/30/2021 via telemedicine.  She was concerned about worsening swelling in her legs.  Is taking 40 mg Lasix daily.  Also noting occasional chest tightness both at rest and with exertion.  We checked a D-dimer.  She had some cramping with rosuvastatin switch back to simvastatin.  Was also told by me not take Zetia because in combination with simvastatin.  Makes no sense.  Palpitations were stable.  In the last minute or 2. --> We added amlodipine 2.5 mg daily for microvascular disease.  Continue beta-blocker and ARB.  Carotid Dopplers, lower extremity arterial Dopplers and Myoview ordered.  Recent Hospitalizations: None  Reviewed  CV studies:    The following studies were reviewed today: (if available, images/films reviewed: From Epic Chart or Care Everywhere) . Carotid Dopplers 10/07/2020: R ICA 1-39% stenosis.  No evidence of LICA stenosis.  Minimal wall thickening or plaque.  Bilateral vertebral arteries normal.  Normal bilateral subclavian arteries.--Normal study. . Lower extremity venous Dopplers 10/07/2020: No evidence of DVT in either right or left leg. . Myoview ordered, not done   Interval History:   CYLEE DATTILO is here today still noticing these off-and-on chest pain episodes.  She says this not all the time.  It is occasionally associate with exertion or social stress, but not always.  She is a very poor historian and not able to provide much as far as details of this chest pain she says is just there.  Its, sharp tugging sensation every now and then.  She then gets it  feels like a heaviness or fullness in the chest. Palpitations seem to be stable.  About the same lasting a few minutes--but she does not "know ".  Less notable cramping sensations apparently since switching from rosuvastatin back to simvastatin at high dose which is hard to imagine.  CV Review of Symptoms (Summary): positive for - chest pain, dyspnea on exertion, palpitations and Relatively vague symptoms.  Short bursts of palpitations, short-lived chest pain plus or minus exertion.  She is not very active, and will get short of breath with just about any activity. negative for - orthopnea, paroxysmal nocturnal dyspnea, shortness of breath or Well-controlled edema.  No syncope near syncope, TIA or amaurosis fugax.  The patient does not have symptoms concerning for COVID-19 infection (fever, chills, cough, or new shortness of breath).   REVIEWED OF SYSTEMS   Review of Systems  Constitutional: Positive for malaise/fatigue (Just feels "tired "). Negative for weight loss.  HENT: Negative for congestion and sinus pain.   Respiratory: Positive for shortness of breath (If she does much of anything). Negative for cough.   Cardiovascular: Positive for chest pain, palpitations and leg swelling.  Gastrointestinal: Negative for blood in stool and melena.  Genitourinary: Positive for frequency (Nocturia). Negative for hematuria.  Musculoskeletal: Positive for back pain and joint pain.  Neurological: Positive for dizziness and weakness (Generalized). Negative for focal  weakness.  Psychiatric/Behavioral: Negative for depression and memory loss. The patient is nervous/anxious and has insomnia (Cannot get back to sleep when she wakes up to urinate).    I have reviewed and (if needed) personally updated the patient's problem list, medications, allergies, past medical and surgical history, social and family history.   PAST MEDICAL HISTORY   Past Medical History:  Diagnosis Date  . Anxiety   . Arthritis     "all over"  . Asthma   . Bladder cancer ([place])   . Chronic bronchitis ([place])    'get it q yr"  . Chronic lower back pain   . Coronary artery disease, non-occlusive 2005   Cardiac catheter 2005 and 2012 for "anginal symptoms "showed angiographically normal coronary arteries. -->  Negative Myoview June 2016.  Coronary calcification noted on chest CT -> calcium score 131, moderate proximal LAD and LCx disease on coronary CTA - (2019)  . Daily headache   . Deafness in left ear   . Depression   . DVT (deep venous thrombosis) ([place]) 2009/2010   "probably left leg"   . GERD (gastroesophageal reflux disease)   . History of kidney stones   . Hyperlipidemia with target LDL less than 100   . Hypertension   . Nummular eczema   . Skin cancer    "right abdomen"    PAST SURGICAL HISTORY   Past Surgical History:  Procedure Laterality Date  . ABDOMINAL HYSTERECTOMY  1980's  . APPENDECTOMY Right   . BACK SURGERY     x 2, lower  . BLADDER TUMOR EXCISION    . BREAST BIOPSY Right   . BREAST EXCISIONAL BIOPSY Right   . CAROTID ARTERY DOPPLERS   10/2017   Carotid Dopplers: 40-59% stenosis on the Right Internal Carotid, < 40% stenosis in the Left. Both vertebral arteries show normal flow. Left subclavian artery is normal, but there is some flow disturbance in the right subclavian artery.  . COLONOSCOPY WITH PROPOFOL N/A 02/26/2015   Procedure: COLONOSCOPY WITH PROPOFOL;  Surgeon: Carol Ada, MD;  Location: [facility];  Service: Endoscopy;  Laterality: N/A;  . COLONOSCOPY WITH PROPOFOL N/A 12/06/2017   Procedure: COLONOSCOPY WITH PROPOFOL;  Surgeon: Juanita Craver, MD;  Location: [facility];  Service: Endoscopy;  Laterality: N/A;  . CORONARY CALCIUM SCORE & CT ANGIOGRAM  11/2017   Coronary calcium score 131 (moderate risk).  Moderate proximal LAD & LCx stenosis --> LOW RISK STUDY.  No evidence to suspect cardiac related chest pain --> correlates with cardiac catheterizations  . ENTEROSCOPY N/A  12/14/2017   Procedure: ENTEROSCOPY;  Surgeon: Carol Ada, MD;  Location: [facility];  Service: Endoscopy;  Laterality: N/A;  . ESOPHAGOGASTRODUODENOSCOPY Left 02/20/2015   Procedure: ESOPHAGOGASTRODUODENOSCOPY (EGD);  Surgeon: Milus Banister, MD;  Location: [place];  Service: Endoscopy;  Laterality: Left;  . ESOPHAGOGASTRODUODENOSCOPY (EGD) WITH PROPOFOL N/A 12/06/2017   Procedure: ESOPHAGOGASTRODUODENOSCOPY (EGD) WITH PROPOFOL;  Surgeon: Juanita Craver, MD;  Location: [facility];  Service: Endoscopy;  Laterality: N/A;  . HOT HEMOSTASIS N/A 12/14/2017   Procedure: HOT HEMOSTASIS (ARGON PLASMA COAGULATION/BICAP);  Surgeon: Carol Ada, MD;  Location: [facility];  Service: Endoscopy;  Laterality: N/A;  . JOINT REPLACEMENT    . KNEE ARTHROSCOPY Right ~ 2000  . LEFT HEART CATH AND CORONARY ANGIOGRAPHY  11/'05; 2/'12   Angiographically normal coronary arteries  . LUMBAR DISC SURGERY  1980's X 1; ~ 2002  . MEDIAL PARTIAL KNEE REPLACEMENT Right 2001  . NM MYOVIEW LTD  02/2015  EF 65%.  No reversible ischemia.  Possible scar along the cardiac apex and anteroseptal wall.  (Mild severity, medium-sized defect in the apex anteroseptal wall noted on both resting and stress images).  No inducible ischemia.  Read as LOW RISK  . PARTIAL KNEE ARTHROPLASTY Left 2009  . REPLACEMENT TOTAL KNEE BILATERAL Bilateral   . REVISION TOTAL KNEE ARTHROPLASTY Left   . SKIN CANCER EXCISION Right    "abdomen"  . TRANSTHORACIC ECHOCARDIOGRAM  06/'16; 2/'19; 1/'21    A) mod Conc LVH. EF 60-65%.  No RWMA. GR 1 D. Mild Ao Stenosis. Mild LA dilation.;  B) EF 60-65%.  Mild LVH.  No or W MA.  No significant valve disease.;l 01/2020: Normal LV systolic function.  Mild LVH.  GR 1 DD.  EF 60 to 65%.  No R WMA.  Trace AI.  No AS    Immunization History  Administered Date(s) Administered  . Fluad Quad(high Dose 65+) 05/25/2020  . Influenza, High Dose Seasonal PF 07/01/2019  . Influenza-Unspecified 05/13/2018  .  PFIZER(Purple Top)SARS-COV-2 Vaccination 09/23/2019, 10/24/2019, 06/16/2020  . Pneumococcal Conjugate-13 09/03/2018  . Tdap 11/16/2017  . Zoster Recombinat (Shingrix) 09/04/2016, 02/02/2017    MEDICATIONS/ALLERGIES   Current Meds  Medication Sig  . acetaminophen (TYLENOL) 500 MG tablet Take 1,000 mg by mouth 2 (two) times daily as needed for moderate pain or headache.   Marland Kitchen amLODipine (NORVASC) 2.5 MG tablet Take 1 tablet (2.5 mg total) by mouth every evening.  . diphenhydrAMINE (BENADRYL) 25 mg capsule Take 25 mg by mouth daily as needed for allergies.  Marland Kitchen ezetimibe (ZETIA) 10 MG tablet Take 1 tablet (10 mg total) by mouth daily.  Marland Kitchen FLUoxetine (PROZAC) 40 MG capsule Take 40 mg by mouth daily as needed (depression).  . furosemide (LASIX) 20 MG tablet Take 1 tablet (20 mg total) by mouth daily. And may take a n additional 20 mg as needed for shortness of breathe or edema( swelling)  . metoprolol succinate (TOPROL XL) 25 MG 24 hr tablet Take 1 tablet (25 mg total) by mouth daily.  . simvastatin (ZOCOR) 80 MG tablet TAKE 1 TABLET(80 MG) BY MOUTH AT BEDTIME  . trandolapril (MAVIK) 2 MG tablet TAKE 1 TABLET(2 MG) BY MOUTH DAILY  . valACYclovir (VALTREX) 1000 MG tablet TAKE 1/2 TABLET BY MOUTH THREE TIMES DAILY AS NEEDED FOR OUTBREAK  . [DISCONTINUED] diclofenac sodium (VOLTAREN) 1 % GEL Apply 2 g topically 4 (four) times daily.  . [DISCONTINUED] NYSTATIN powder APPLY TOPICALLY 4 TIMES A DAY    Allergies  Allergen Reactions  . Penicillins Shortness Of Breath, Itching, Swelling, Rash and Other (See Comments)    Has patient had a PCN reaction causing immediate rash, facial/tongue/throat swelling, SOB or lightheadedness with hypotension: Yes Has patient had a PCN reaction causing severe rash involving mucus membranes or skin necrosis: No Has patient had a PCN reaction that required hospitalization: Yes Has patient had a PCN reaction occurring within the last 10 years: No If all of the above  answers are "NO", then may proceed with Cephalosporin use.   . Azithromycin Itching, Swelling and Rash  . Levaquin [Levofloxacin In D5w] Hives, Itching, Swelling and Rash  . Clindamycin/Lincomycin Hives  . Erythromycin Base Itching  . Keflex [Cephalexin] Itching and Rash  . Sulfa Antibiotics Itching and Rash  . Tetracyclines & Related Itching and Rash    SOCIAL HISTORY/FAMILY HISTORY   Reviewed in Epic:  Pertinent findings:  Social History   Tobacco Use  . Smoking status:  Never Smoker  . Smokeless tobacco: Never Used  Vaping Use  . Vaping Use: Never used  Substance Use Topics  . Alcohol use: No  . Drug use: No   Social History   Social History Narrative   Relocated from [place]   She is currently "'.  She lives with her son (who is autistic).  She herself is disabled, and not working.   She never smoked and never drank alcohol.   She enjoys walking 7 days a week.    OBJCTIVE -PE, EKG, labs   Wt Readings from Last 3 Encounters:  11/25/20 205 lb 3.2 oz (93.1 kg)  09/29/20 205 lb (93 kg)  08/18/20 206 lb (93.4 kg)    Physical Exam: BP 122/82   Pulse 73   Ht 5\' 5"  (1.651 m)   Wt 205 lb 3.2 oz (93.1 kg)   SpO2 96%   BMI 34.15 kg/m  Physical Exam Vitals reviewed.  Constitutional:      General: She is not in acute distress.    Appearance: Normal appearance. She is obese. She is not ill-appearing or toxic-appearing.     Comments: Well-groomed.  Healthy-appearing.  HENT:     Head: Normocephalic and atraumatic.  Neck:     Vascular: No carotid bruit, hepatojugular reflux or JVD.  Cardiovascular:     Rate and Rhythm: Normal rate. Rhythm irregular. Occasional extrasystoles are present.    Chest Wall: PMI is not displaced.     Pulses: Normal pulses.     Heart sounds: S1 normal and S2 normal. Heart sounds are distant. Murmur (Soft 1/6 SEM at RUSB.) heard.  No friction rub. No gallop.   Pulmonary:     Effort: Pulmonary effort is normal. No respiratory distress.      Breath sounds: Normal breath sounds.  Chest:     Chest wall: No tenderness.  Musculoskeletal:        General: Swelling (Trace bilateral LE edema) present. Normal range of motion.     Cervical back: Normal range of motion and neck supple.  Skin:    General: Skin is warm and dry.  Neurological:     General: No focal deficit present.     Mental Status: She is alert and oriented to person, place, and time.     Motor: No weakness.  Psychiatric:        Mood and Affect: Mood normal.        Behavior: Behavior normal.        Thought Content: Thought content normal.        Judgment: Judgment normal.     Adult ECG Report  Rate: 73 ;  Rhythm: normal sinus rhythm, sinus arrhythmia and Cannot rule out, anterior margin age-indeterminate.;   Narrative Interpretation: Stable  Recent Labs: Reviewed Lab Results  Component Value Date   CHOL 167 08/18/2020   HDL 70 08/18/2020   LDLCALC 75 08/18/2020   TRIG 127 08/18/2020   CHOLHDL 2.4 08/18/2020   Lab Results  Component Value Date   CREATININE 0.80 09/30/2020   BUN 7 (L) 09/30/2020   NA 142 09/30/2020   K 3.6 09/30/2020   CL 102 09/30/2020   CO2 24 09/30/2020   CBC Latest Ref Rng & Units 03/31/2020 02/12/2020 03/31/2019  WBC 3.4 - 10.8 x10E3/uL 8.7 9.2 8.1  Hemoglobin 11.1 - 15.9 g/dL 11.1 10.6(L) 10.1(L)  Hematocrit 34.0 - 46.6 % 35.9 35.0(L) 31.3(L)  Platelets 150 - 450 x10E3/uL 385 376 314    Lab Results  Component Value Date   TSH 1.256 02/18/2015    ==================================================  COVID-19 Education: The signs and symptoms of COVID-19 were discussed with the patient and how to seek care for testing (follow up with PCP or arrange E-visit).   The importance of social distancing and COVID-19 vaccination was discussed today. The patient is practicing social distancing & Masking.   I spent a total of 48minutes with the patient spent in direct patient consultation.  Additional time spent with chart review  /  charting (studies, outside notes, etc): 15 min Total Time: 42 min   Current medicines are reviewed at length with the patient today.  (+/- concerns) n/a  This visit occurred during the SARS-CoV-2 public health emergency.  Safety protocols were in place, including screening questions prior to the visit, additional usage of staff PPE, and extensive cleaning of exam room while observing appropriate contact time as indicated for disinfecting solutions.  Notice: This dictation was prepared with Dragon dictation along with smaller phrase technology. Any transcriptional errors that result from this process are unintentional and may not be corrected upon review.  Patient Instructions / Medication Changes & Studies & Tests Ordered   Patient Instructions  Medication Instructions:  No changes   *If you need a refill on your cardiac medications before your next appointment, please call your pharmacy*   Lab Work: Not needed If you have labs (blood work) drawn today and your tests are completely normal, you will receive your results only by: Marland Kitchen MyChart Message (if you have MyChart) OR . A paper copy in the mail If you have any lab test that is abnormal or we need to change your treatment, we will call you to review the results.   Testing/Procedures:  .will be schedule for [address] Your physician has requested that you have a lexiscan myoview. Please follow instruction sheet, as given.   Follow-Up: At [facility], you and your health needs are our priority.  As part of our continuing mission to provide you with exceptional heart care, we have created designated Provider Care Teams.  These Care Teams include your primary Cardiologist (physician) and Advanced Practice Providers (APPs -  Physician Assistants and Nurse Practitioners) who all work together to provide you with the care you need, when you need it.     Your next appointment:   6 month(s)  The format for your  next appointment:   In Person  Provider:   Glenetta Hew, MD   Other Instructions    Studies Ordered:   Orders Placed This Encounter  Procedures  . Cardiac Stress Test: Informed Consent Details: Physician/Practitioner Attestation; Transcribe to consent form and obtain patient signature  . EKG 12-Lead     Glenetta Hew, M.D., M.S. Interventional Cardiologist   Pager # [phone] Phone # [phone] [address] [place], [place] [address]   Thank you for choosing [facility] at [facility]!!

## 2020-11-27 ENCOUNTER — Encounter: Payer: Self-pay | Admitting: Cardiology

## 2020-11-27 NOTE — Assessment & Plan Note (Addendum)
   Previously nonobstructive CAD-plan Myoview stress test for ischemic evaluation.

## 2020-11-27 NOTE — Assessment & Plan Note (Signed)
Minimally elevated coronary calcium score of 131 in 2019.  Negative Myoview in 2016.  She is on beta-blocker for palpitations/SVT, along with ACE inhibitor and the recently added amlodipine for possible microvascular ischemia.  There will help with the symptoms.  Goal LDL is closer to 70-100.  Currently 75.  Well-controlled.  Plan:  With recurrent chest discomfort symptoms.  I had ordered a nuclear stress test which we will reorder.  Continue calcium blocker, beta-blocker and ACE inhibitor at current doses for now.  May titrate up calcium channel blocker for additional symptom relief  She is back on simvastatin, apparently did not tolerate rosuvastatin and Zetia. ->  As long as her lipid panel looks stable, will continue current doses.

## 2020-11-27 NOTE — Assessment & Plan Note (Addendum)
Last LDL was 75.  Apparently she did not tolerate rosuvastatin and Zetia.  Is back on simvastatin.  For now, continue to monitor.  Labs are being followed by PCP.

## 2020-11-27 NOTE — Assessment & Plan Note (Signed)
She has this strange sensation of chest discomfort at rest and with exertion.  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  She has reproducible chest pain on exam.  However, she says this is not the same symptom.  Plan: Lexiscan Myoview.  Need to exclude ischemia.  Shared Decision Making/Informed Consent{ All outpatient stress tests require an informed consent ([id number]) ATTESTATION ORDER  -> order placed The risks [chest pain, shortness of breath, cardiac arrhythmias, dizziness, blood pressure fluctuations, myocardial infarction, stroke/transient ischemic attack, nausea, vomiting, allergic reaction, radiation exposure, metallic taste sensation and life-threatening complications (estimated to be 1 in 10,000)], benefits (risk stratification, diagnosing coronary artery disease, treatment guidance) and alternatives of a nuclear stress test were discussed in detail with [name] and she agrees to proceed.

## 2020-11-27 NOTE — Assessment & Plan Note (Signed)
Blood pressure well controlled on current medications.  No change.  She does have room to potentially titrate up amlodipine if necessary.

## 2020-11-27 NOTE — Assessment & Plan Note (Signed)
She only had a few short burst noted on her event monitor.  She feels intermittent palpitations which seem to have improved with beta-blocker.  Continue current dose at this point.  Toprol 25 mg daily.  No real breakthrough spells.

## 2020-12-02 ENCOUNTER — Inpatient Hospital Stay (HOSPITAL_COMMUNITY): Admission: RE | Admit: 2020-12-02 | Payer: Medicare HMO | Source: Ambulatory Visit

## 2020-12-07 ENCOUNTER — Telehealth (HOSPITAL_COMMUNITY): Payer: Self-pay

## 2020-12-07 DIAGNOSIS — F419 Anxiety disorder, unspecified: Secondary | ICD-10-CM | POA: Diagnosis not present

## 2020-12-07 DIAGNOSIS — F331 Major depressive disorder, recurrent, moderate: Secondary | ICD-10-CM | POA: Diagnosis not present

## 2020-12-07 NOTE — Telephone Encounter (Signed)
Close encounter 

## 2020-12-09 ENCOUNTER — Other Ambulatory Visit: Payer: Self-pay

## 2020-12-09 ENCOUNTER — Ambulatory Visit (HOSPITAL_COMMUNITY)
Admission: RE | Admit: 2020-12-09 | Discharge: 2020-12-09 | Disposition: A | Discharge status: Home / Self Care | Payer: Medicare HMO | Source: Ambulatory Visit | Attending: Internal Medicine | Admitting: Internal Medicine

## 2020-12-09 DIAGNOSIS — I2510 Atherosclerotic heart disease of native coronary artery without angina pectoris: Secondary | ICD-10-CM | POA: Diagnosis not present

## 2020-12-09 HISTORY — PX: NM MYOVIEW LTD: HXRAD82

## 2020-12-09 LAB — MYOCARDIAL PERFUSION IMAGING
LV dias vol: 119 mL (ref 46–106)
LV sys vol: 41 mL
Peak HR: 86 {beats}/min
Rest HR: 68 {beats}/min
SDS: 0
SRS: 1
SSS: 1
TID: 1.14

## 2020-12-09 MED ORDER — TECHNETIUM TC 99M TETROFOSMIN IV KIT
30.4000 | PACK | Freq: Once | INTRAVENOUS | Status: AC | PRN
  Administered 2020-12-09: 30.4 via INTRAVENOUS
  Filled 2020-12-09: qty 31

## 2020-12-09 MED ORDER — REGADENOSON 0.4 MG/5ML IV SOLN
0.4000 mg | Freq: Once | INTRAVENOUS | Status: AC
  Administered 2020-12-09: 0.4 mg via INTRAVENOUS

## 2020-12-09 MED ORDER — TECHNETIUM TC 99M TETROFOSMIN IV KIT
10.8000 | PACK | Freq: Once | INTRAVENOUS | Status: AC | PRN
  Administered 2020-12-09: 10.8 via INTRAVENOUS
  Filled 2020-12-09: qty 11

## 2020-12-20 ENCOUNTER — Ambulatory Visit (INDEPENDENT_AMBULATORY_CARE_PROVIDER_SITE_OTHER): Payer: Medicare HMO | Admitting: Nurse Practitioner

## 2020-12-20 ENCOUNTER — Encounter: Payer: Self-pay | Admitting: Nurse Practitioner

## 2020-12-20 ENCOUNTER — Other Ambulatory Visit: Payer: Self-pay

## 2020-12-20 VITALS — BP 124/80 | HR 77 | Temp 98.3°F | Ht 65.0 in | Wt 208.4 lb

## 2020-12-20 DIAGNOSIS — I10 Essential (primary) hypertension: Secondary | ICD-10-CM

## 2020-12-20 DIAGNOSIS — Z6834 Body mass index (BMI) 34.0-34.9, adult: Secondary | ICD-10-CM

## 2020-12-20 DIAGNOSIS — E782 Mixed hyperlipidemia: Secondary | ICD-10-CM

## 2020-12-20 DIAGNOSIS — E669 Obesity, unspecified: Secondary | ICD-10-CM | POA: Diagnosis not present

## 2020-12-20 MED ORDER — TRANDOLAPRIL 2 MG PO TABS: 2.0000 mg | ORAL_TABLET | Freq: Every day | ORAL | 1 refills | Status: DC

## 2020-12-20 MED ORDER — VALACYCLOVIR HCL 1 G PO TABS: ORAL_TABLET | ORAL | 1 refills | Status: DC

## 2020-12-20 NOTE — Progress Notes (Signed)
I,Yamilka [employer] as a Education administrator for [employer], FNP.,have documented all relevant documentation on the behalf of [name], FNP,as directed by  [name], FNP while in the presence of [name], [place]. This visit occurred during the SARS-CoV-2 public health emergency.  Safety protocols were in place, including screening questions prior to the visit, additional usage of staff PPE, and extensive cleaning of exam room while observing appropriate contact time as indicated for disinfecting solutions.  Subjective:     Patient ID: Peggy [name] , female    DOB: [birth year] , 75 y.o.   MRN: [id number]   Chief Complaint  Patient presents with  . Hyperlipidemia    HPI  Patient presents today for a chol f/u   Wt Readings from Last 3 Encounters: 12/20/20 : 208 lb 6.4 oz (94.5 kg) 12/09/20 : 205 lb (93 kg) 11/25/20 : 205 lb 3.2 oz (93.1 kg)  She is not able to have surgery to her left knee due to being under anesthesia for a long period of time. She will continue seeing the orthopedic for her knee pain.   Hyperlipidemia This is a chronic problem. The problem is controlled. Recent lipid tests were reviewed and are normal. She has no history of chronic renal disease. There are no known factors aggravating her hyperlipidemia. Pertinent negatives include no chest pain. Risk factors for coronary artery disease include obesity and a sedentary lifestyle.     Past Medical History:  Diagnosis Date  . Anxiety   . Arthritis    "all over"  . Asthma   . Bladder cancer ([place])   . Chronic bronchitis ([place])    'get it q yr"  . Chronic lower back pain   . Coronary artery disease, non-occlusive 2005   Cardiac catheter 2005 and 2012 for "anginal symptoms "showed angiographically normal coronary arteries. -->  Negative Myoview June 2016.  Coronary calcification noted on chest CT -> calcium score 131, moderate proximal LAD and LCx disease on coronary CTA - (2019)  . Daily headache   . Deafness  in left ear   . Depression   . DVT (deep venous thrombosis) ([place]) 2009/2010   "probably left leg"   . GERD (gastroesophageal reflux disease)   . History of kidney stones   . Hyperlipidemia with target LDL less than 100   . Hypertension   . Nummular eczema   . Skin cancer    "right abdomen"     Family History  Problem Relation Age of Onset  . Heart failure Mother   . Cancer Mother   . Hypertension Mother   . Hypertension Father   . Heart attack Father        Noted on cardiology intake form as "massive heart attack" at age 21  . Heart failure Sister   . Heart attack Sister        Not listed on her cardiology intake form  . Breast cancer Sister   . Heart failure Sister   . Heart attack Sister        Not listed on her cardiology intake  . Cancer Sister   . Breast cancer Sister      Current Outpatient Medications:  .  acetaminophen (TYLENOL) 500 MG tablet, Take 1,000 mg by mouth 2 (two) times daily as needed for moderate pain or headache. , Disp: , Rfl:  .  amLODipine (NORVASC) 2.5 MG tablet, Take 1 tablet (2.5 mg total) by mouth every evening., Disp: 90 tablet, Rfl: 3 .  diphenhydrAMINE (BENADRYL) 25 mg capsule, Take 25 mg by mouth daily as needed for allergies., Disp: , Rfl:  .  ezetimibe (ZETIA) 10 MG tablet, Take 1 tablet (10 mg total) by mouth daily., Disp: 90 tablet, Rfl: 3 .  FLUoxetine (PROZAC) 40 MG capsule, Take 40 mg by mouth daily as needed (depression)., Disp: , Rfl:  .  furosemide (LASIX) 20 MG tablet, Take 1 tablet (20 mg total) by mouth daily. And may take a n additional 20 mg as needed for shortness of breathe or edema( swelling), Disp: 100 tablet, Rfl: 3 .  metoprolol succinate (TOPROL XL) 25 MG 24 hr tablet, Take 1 tablet (25 mg total) by mouth daily., Disp: 90 tablet, Rfl: 3 .  simvastatin (ZOCOR) 80 MG tablet, TAKE 1 TABLET(80 MG) BY MOUTH AT BEDTIME, Disp: 90 tablet, Rfl: 1 .  trandolapril (MAVIK) 2 MG tablet, Take 1 tablet (2 mg total) by mouth daily.,  Disp: 90 tablet, Rfl: 1 .  valACYclovir (VALTREX) 1000 MG tablet, TAKE 1/2 TABLET BY MOUTH THREE TIMES DAILY AS NEEDED FOR OUTBREAK, Disp: 135 tablet, Rfl: 1   Allergies  Allergen Reactions  . Penicillins Shortness Of Breath, Itching, Swelling, Rash and Other (See Comments)    Has patient had a PCN reaction causing immediate rash, facial/tongue/throat swelling, SOB or lightheadedness with hypotension: Yes Has patient had a PCN reaction causing severe rash involving mucus membranes or skin necrosis: No Has patient had a PCN reaction that required hospitalization: Yes Has patient had a PCN reaction occurring within the last 10 years: No If all of the above answers are "NO", then may proceed with Cephalosporin use.   . Azithromycin Itching, Swelling and Rash  . Levaquin [Levofloxacin In D5w] Hives, Itching, Swelling and Rash  . Clindamycin/Lincomycin Hives  . Erythromycin Base Itching  . Keflex [Cephalexin] Itching and Rash  . Sulfa Antibiotics Itching and Rash  . Tetracyclines & Related Itching and Rash     Review of Systems  Constitutional: Negative.  Negative for fatigue.  Eyes: Negative.   Respiratory: Negative.  Negative for cough and wheezing.   Cardiovascular: Negative.  Negative for chest pain, palpitations and leg swelling.  Endocrine: Negative for polydipsia, polyphagia and polyuria.  Musculoskeletal: Negative.   Skin: Negative.   Neurological: Negative.  Negative for dizziness and headaches.  Psychiatric/Behavioral: Negative.      Today's Vitals   12/20/20 0917  BP: 124/80  Pulse: 77  Temp: 98.3 F (36.8 C)  TempSrc: Oral  Weight: 208 lb 6.4 oz (94.5 kg)  Height: $Remove'5\' 5"'ZnflRRq$  (1.651 m)  PainSc: 10-Worst pain ever  PainLoc: Back   Body mass index is 34.68 kg/m.   Objective:  Physical Exam Vitals reviewed.  Constitutional:      General: She is not in acute distress.    Appearance: Normal appearance. She is obese.  Cardiovascular:     Rate and Rhythm: Normal rate  and regular rhythm.     Pulses: Normal pulses.     Heart sounds: Normal heart sounds. No murmur heard.   Pulmonary:     Effort: Pulmonary effort is normal. No respiratory distress.     Breath sounds: Normal breath sounds. No wheezing.  Musculoskeletal:        General: Swelling, tenderness (bilateral knees ) and deformity present.     Right lower leg: Edema present.     Left lower leg: Edema present.  Skin:    General: Skin is warm and dry.     Capillary Refill: Capillary  refill takes less than 2 seconds.  Neurological:     General: No focal deficit present.     Mental Status: She is alert and oriented to person, place, and time.     Cranial Nerves: No cranial nerve deficit.  Psychiatric:        Mood and Affect: Mood normal.        Behavior: Behavior normal.        Thought Content: Thought content normal.        Judgment: Judgment normal.         Assessment And Plan:     1. Mixed hyperlipidemia  Chronic, controlled  Continue with current medications, tolerating medications well - Lipid panel - CMP14+EGFR  2. Essential hypertension . B/P is well controlled.  . Continue follow up with Dr. Ellyn Hack . CMP ordered to check renal function.  . The importance of regular exercise and dietary modification was stressed to the patient.  - trandolapril (MAVIK) 2 MG tablet; Take 1 tablet (2 mg total) by mouth daily.  Dispense: 90 tablet; Refill: 1 - CMP14+EGFR  3. Class 1 obesity with serious comorbidity and body mass index (BMI) of 34.0 to 34.9 in adult, unspecified obesity type  Chronic  Discussed healthy diet and regular exercise options   Encouraged to do chair exercise at least 150 minutes per week with 2 days of strength training   Patient was given opportunity to ask questions. Patient verbalized understanding of the plan and was able to repeat key elements of the plan. All questions were answered to their satisfaction.  [name], FNP   I, [name], FNP, have  reviewed all documentation for this visit. The documentation on 12/20/20 for the exam, diagnosis, procedures, and orders are all accurate and complete.   IF YOU HAVE BEEN REFERRED TO A SPECIALIST, IT MAY TAKE 1-2 WEEKS TO SCHEDULE/PROCESS THE REFERRAL. IF YOU HAVE NOT HEARD FROM US/SPECIALIST IN TWO WEEKS, PLEASE GIVE [place] A CALL AT [phone].   THE PATIENT IS ENCOURAGED TO PRACTICE SOCIAL DISTANCING DUE TO THE COVID-19 PANDEMIC.

## 2020-12-20 NOTE — Patient Instructions (Signed)
High Cholesterol  High cholesterol is a condition in which the blood has high levels of a white, waxy substance similar to fat (cholesterol). The liver makes all the cholesterol that the body needs. The human body needs small amounts of cholesterol to help build cells. A person gets extra or excess cholesterol from the food that he or she eats. The blood carries cholesterol from the liver to the rest of the body. If you have high cholesterol, deposits (plaques) may build up on the walls of your arteries. Arteries are the blood vessels that carry blood away from your heart. These plaques make the arteries narrow and stiff. Cholesterol plaques increase your risk for heart attack and stroke. Work with your health care provider to keep your cholesterol levels in a healthy range. What increases the risk? The following factors may make you more likely to develop this condition:  Eating foods that are high in animal fat (saturated fat) or cholesterol.  Being overweight.  Not getting enough exercise.  A family history of high cholesterol (familial hypercholesterolemia).  Use of tobacco products.  Having diabetes. What are the signs or symptoms? There are no symptoms of this condition. How is this diagnosed? This condition may be diagnosed based on the results of a blood test.  If you are older than 77 years of age, your health care provider may check your cholesterol levels every 4-6 years.  You may be checked more often if you have high cholesterol or other risk factors for heart disease. The blood test for cholesterol measures:  "Bad" cholesterol, or LDL cholesterol. This is the main type of cholesterol that causes heart disease. The desired level is less than 100 mg/dL.  "Good" cholesterol, or HDL cholesterol. HDL helps protect against heart disease by cleaning the arteries and carrying the LDL to the liver for processing. The desired level for HDL is 60 mg/dL or higher.  Triglycerides.  These are fats that your body can store or burn for energy. The desired level is less than 150 mg/dL.  Total cholesterol. This measures the total amount of cholesterol in your blood and includes LDL, HDL, and triglycerides. The desired level is less than 200 mg/dL. How is this treated? This condition may be treated with:  Diet changes. You may be asked to eat foods that have more fiber and less saturated fats or added sugar.  Lifestyle changes. These may include regular exercise, maintaining a healthy weight, and quitting use of tobacco products.  Medicines. These are given when diet and lifestyle changes have not worked. You may be prescribed a statin medicine to help lower your cholesterol levels. Follow these instructions at home: Eating and drinking  Eat a healthy, balanced diet. This diet includes: ? Daily servings of a variety of fresh, frozen, or canned fruits and vegetables. ? Daily servings of whole grain foods that are rich in fiber. ? Foods that are low in saturated fats and trans fats. These include poultry and fish without skin, lean cuts of meat, and low-fat dairy products. ? A variety of fish, especially oily fish that contain omega-3 fatty acids. Aim to eat fish at least 2 times a week.  Avoid foods and drinks that have added sugar.  Use healthy cooking methods, such as roasting, grilling, broiling, baking, poaching, steaming, and stir-frying. Do not fry your food except for stir-frying.   Lifestyle  Get regular exercise. Aim to exercise for a total of 150 minutes a week. Increase your activity level by doing activities   such as gardening, walking, and taking the stairs.  Do not use any products that contain nicotine or tobacco, such as cigarettes, e-cigarettes, and chewing tobacco. If you need help quitting, ask your health care provider.   General instructions  Take over-the-counter and prescription medicines only as told by your health care provider.  Keep all  follow-up visits as told by your health care provider. This is important. Where to find more information  American Heart Association: www.heart.org  National Heart, Lung, and Blood Institute: www.nhlbi.nih.gov Contact a health care provider if:  You have trouble achieving or maintaining a healthy diet or weight.  You are starting an exercise program.  You are unable to stop smoking. Get help right away if:  You have chest pain.  You have trouble breathing.  You have any symptoms of a stroke. "BE FAST" is an easy way to remember the main warning signs of a stroke: ? B - Balance. Signs are dizziness, sudden trouble walking, or loss of balance. ? E - Eyes. Signs are trouble seeing or a sudden change in vision. ? F - Face. Signs are sudden weakness or numbness of the face, or the face or eyelid drooping on one side. ? A - Arms. Signs are weakness or numbness in an arm. This happens suddenly and usually on one side of the body. ? S - Speech. Signs are sudden trouble speaking, slurred speech, or trouble understanding what people say. ? T - Time. Time to call emergency services. Write down what time symptoms started.  You have other signs of a stroke, such as: ? A sudden, severe headache with no known cause. ? Nausea or vomiting. ? Seizure. These symptoms may represent a serious problem that is an emergency. Do not wait to see if the symptoms will go away. Get medical help right away. Call your local emergency services (911 in the [place] Do not drive yourself to the hospital. Summary  Cholesterol plaques increase your risk for heart attack and stroke. Work with your health care provider to keep your cholesterol levels in a healthy range.  Eat a healthy, balanced diet, get regular exercise, and maintain a healthy weight.  Do not use any products that contain nicotine or tobacco, such as cigarettes, e-cigarettes, and chewing tobacco.  Get help right away if you have any symptoms of a  stroke. This information is not intended to replace advice given to you by your health care provider. Make sure you discuss any questions you have with your health care provider. Document Revised: 07/21/2019 Document Reviewed: 07/21/2019 Elsevier Patient Education  2021 Elsevier Inc.  

## 2020-12-21 LAB — LIPID PANEL
Chol/HDL Ratio: 2.2 ratio (ref 0.0–4.4)
Cholesterol, Total: 152 mg/dL (ref 100–199)
HDL: 68 mg/dL (ref >39)
LDL Chol Calc (NIH): 66 mg/dL (ref 0–99)
Triglycerides: 96 mg/dL (ref 0–149)
VLDL Cholesterol Cal: 18 mg/dL (ref 5–40)

## 2020-12-21 LAB — CMP14+EGFR
ALT: 9 IU/L (ref 0–32)
AST: 12 IU/L (ref 0–40)
Albumin/Globulin Ratio: 1.7 (ref 1.2–2.2)
Albumin: 4.5 g/dL (ref 3.7–4.7)
Alkaline Phosphatase: 154 IU/L — ABNORMAL HIGH (ref 44–121)
BUN/Creatinine Ratio: 11 — ABNORMAL LOW (ref 12–28)
BUN: 10 mg/dL (ref 8–27)
Bilirubin Total: 0.5 mg/dL (ref 0.0–1.2)
CO2: 24 mmol/L (ref 20–29)
Calcium: 9.1 mg/dL (ref 8.7–10.3)
Chloride: 105 mmol/L (ref 96–106)
Creatinine, Ser: 0.95 mg/dL (ref 0.57–1.00)
Globulin, Total: 2.7 g/dL (ref 1.5–4.5)
Glucose: 75 mg/dL (ref 65–99)
Potassium: 3.5 mmol/L (ref 3.5–5.2)
Sodium: 144 mmol/L (ref 134–144)
Total Protein: 7.2 g/dL (ref 6.0–8.5)
eGFR: 62 mL/min/{1.73_m2} (ref >59)

## 2021-01-01 ENCOUNTER — Encounter: Payer: Self-pay | Admitting: Nurse Practitioner

## 2021-01-05 ENCOUNTER — Other Ambulatory Visit: Payer: Self-pay | Admitting: Nurse Practitioner

## 2021-01-11 ENCOUNTER — Telehealth: Payer: Self-pay

## 2021-01-11 NOTE — Telephone Encounter (Signed)
I left on the pt's voicemail that [name], DNP, FNP-BC said she will fax the pt's medication to her pharmacy that she needed to take prior to her dental appt.

## 2021-01-11 NOTE — Telephone Encounter (Signed)
-----   Message from [name], [employer] sent at 01/10/2021  5:34 PM EDT ----- Regarding: RE: dental appt Yes, I will fax tomorrow  ----- Message ----- From: Michelle Nasuti, CMA Sent: 01/10/2021  10:50 AM EDT To: [name], FNP Subject: dental appt                                    The pt said that her dentist office faxed a from over requesting that she has medication sent to her pharmacy to take prior to her appt for her teeth cleaning.

## 2021-01-12 ENCOUNTER — Other Ambulatory Visit: Payer: Self-pay | Admitting: Nurse Practitioner

## 2021-01-12 DIAGNOSIS — R748 Abnormal levels of other serum enzymes: Secondary | ICD-10-CM

## 2021-01-17 ENCOUNTER — Telehealth: Payer: Self-pay

## 2021-01-17 NOTE — Telephone Encounter (Signed)
Patient called demanding an appointment due to having cold symptoms and lower back pain. We were with a patient at the time of call so I advised Carolyne Fiscal to let her know we would give her a call as soon as we were available.    I just gave her a call and was unable to leave her a vm. Will try again later. YL,RMA

## 2021-02-14 ENCOUNTER — Telehealth: Payer: Self-pay | Admitting: *Deleted

## 2021-02-14 NOTE — Telephone Encounter (Signed)
   [facility] Pre-operative Risk Assessment    Patient Name: Peggy [name]  DOB: 1945/11/01  MRN: [id number]   HEARTCARE STAFF: - Please ensure there is not already an duplicate clearance open for this procedure. - Under Visit Info/Reason for Call, type in Other and utilize the format Clearance MM/DD/YY or Clearance TBD. Do not use dashes or single digits. - If request is for dental extraction, please clarify the # of teeth to be extracted. - If the patient is currently at the dentist's office, call Pre-Op APP to address. If the patient is not currently in the dentist office, please route to the Pre-Op pool  Request for surgical clearance:  What type of surgery is being performed? REMOVAL PALATAL TORUS, BUCCAL EXOSTOSIS RIGHT MAXILLA   When is this surgery scheduled? TBD   What type of clearance is required (medical clearance vs. Pharmacy clearance to hold med vs. Both)? MEDICAL  Are there any medications that need to be held prior to surgery and how long? NONE LISTED   Practice name and name of physician performing surgery? SCOTT JENSEN, D.M.D   What is the office phone number? [phone]   7.   What is the office fax number? [phone]  8.   Anesthesia type (None, local, MAC, general) ? GENERAL   Julaine Hua 02/14/2021, 4:51 PM  _________________________________________________________________   (provider comments below)

## 2021-02-15 NOTE — Telephone Encounter (Signed)
    Peggy [name] DOB:  1945/11/02  MRN:  [id number]   Primary Cardiologist: Dr. Ellyn Hack, MD   Chart reviewed as part of pre-operative protocol coverage. Given past medical history and time since last visit, based on ACC/AHA guidelines, Peggy [name] would be at acceptable risk for the planned procedure without further cardiovascular testing. She was recently seen in follow up with Dr. Ellyn Hack at which time it was planned for her to undergo a Lexiscan stress test. This was performed 12/09/20 that showed no ischemia or infarct, considered a low risk study. She was doing well when I spoke with her on the phone today with no complaints of chest pain or SOB.   The patient was advised that if she develops new symptoms prior to surgery to contact our office to arrange for a follow-up visit, and she verbalized understanding.  I will route this recommendation to the requesting party via Epic fax function and remove from pre-op pool.  Please call with questions.  Kathyrn Drown, NP 02/15/2021, 11:21 AM

## 2021-03-15 DIAGNOSIS — F331 Major depressive disorder, recurrent, moderate: Secondary | ICD-10-CM | POA: Diagnosis not present

## 2021-03-15 DIAGNOSIS — F419 Anxiety disorder, unspecified: Secondary | ICD-10-CM | POA: Diagnosis not present

## 2021-04-04 DIAGNOSIS — R110 Nausea: Secondary | ICD-10-CM | POA: Diagnosis not present

## 2021-04-04 DIAGNOSIS — R079 Chest pain, unspecified: Secondary | ICD-10-CM | POA: Diagnosis not present

## 2021-04-04 DIAGNOSIS — R0789 Other chest pain: Secondary | ICD-10-CM | POA: Diagnosis not present

## 2021-04-04 DIAGNOSIS — R42 Dizziness and giddiness: Secondary | ICD-10-CM | POA: Diagnosis not present

## 2021-04-04 DIAGNOSIS — I10 Essential (primary) hypertension: Secondary | ICD-10-CM | POA: Diagnosis not present

## 2021-04-05 ENCOUNTER — Ambulatory Visit (INDEPENDENT_AMBULATORY_CARE_PROVIDER_SITE_OTHER): Payer: Medicare HMO | Admitting: Nurse Practitioner

## 2021-04-05 ENCOUNTER — Other Ambulatory Visit: Payer: Self-pay

## 2021-04-05 ENCOUNTER — Encounter: Payer: Self-pay | Admitting: Nurse Practitioner

## 2021-04-05 ENCOUNTER — Telehealth: Payer: Self-pay | Admitting: Cardiology

## 2021-04-05 VITALS — BP 134/70 | HR 99 | Temp 98.4°F | Ht 62.0 in | Wt 202.2 lb

## 2021-04-05 DIAGNOSIS — R21 Rash and other nonspecific skin eruption: Secondary | ICD-10-CM | POA: Diagnosis not present

## 2021-04-05 DIAGNOSIS — E785 Hyperlipidemia, unspecified: Secondary | ICD-10-CM | POA: Diagnosis not present

## 2021-04-05 DIAGNOSIS — E782 Mixed hyperlipidemia: Secondary | ICD-10-CM

## 2021-04-05 DIAGNOSIS — M25562 Pain in left knee: Secondary | ICD-10-CM | POA: Diagnosis not present

## 2021-04-05 DIAGNOSIS — Z0000 Encounter for general adult medical examination without abnormal findings: Secondary | ICD-10-CM

## 2021-04-05 DIAGNOSIS — G8929 Other chronic pain: Secondary | ICD-10-CM

## 2021-04-05 DIAGNOSIS — E669 Obesity, unspecified: Secondary | ICD-10-CM

## 2021-04-05 DIAGNOSIS — R252 Cramp and spasm: Secondary | ICD-10-CM | POA: Diagnosis not present

## 2021-04-05 DIAGNOSIS — Z6834 Body mass index (BMI) 34.0-34.9, adult: Secondary | ICD-10-CM | POA: Diagnosis not present

## 2021-04-05 DIAGNOSIS — I10 Essential (primary) hypertension: Secondary | ICD-10-CM

## 2021-04-05 LAB — POCT URINALYSIS DIPSTICK
Bilirubin, UA: NEGATIVE
Blood, UA: NEGATIVE
Glucose, UA: NEGATIVE
Ketones, UA: NEGATIVE
Nitrite, UA: NEGATIVE
Protein, UA: NEGATIVE
Spec Grav, UA: 1.02 (ref 1.010–1.025)
Urobilinogen, UA: 0.2 E.U./dL
pH, UA: 5.5 (ref 5.0–8.0)

## 2021-04-05 LAB — POCT UA - MICROALBUMIN
Albumin/Creatinine Ratio, Urine, POC: <30
Creatinine, POC: 200 mg/dL
Microalbumin Ur, POC: 10 mg/L

## 2021-04-05 MED ORDER — NYSTATIN 100000 UNIT/GM EX POWD: CUTANEOUS | 1 refills | Status: DC

## 2021-04-05 MED ORDER — CIPROFLOXACIN HCL 500 MG PO TABS: 500.0000 mg | ORAL_TABLET | Freq: Two times a day (BID) | ORAL | 3 refills | Status: DC

## 2021-04-05 MED ORDER — OXYCODONE-ACETAMINOPHEN 10-325 MG PO TABS: 1.0000 | ORAL_TABLET | Freq: Four times a day (QID) | ORAL | 0 refills | Status: AC | PRN

## 2021-04-05 NOTE — Progress Notes (Signed)
I,[name],acting as a Education administrator for Peggy Brine, FNP.,have documented all relevant documentation on the behalf of Peggy Brine, FNP,as directed by  Peggy Brine, FNP while in the presence of Peggy [name], [place].  This visit occurred during the SARS-CoV-2 public health emergency.  Safety protocols were in place, including screening questions prior to the visit, additional usage of staff PPE, and extensive cleaning of exam room while observing appropriate contact time as indicated for disinfecting solutions.  Subjective:     Patient ID: Peggy [name] , female    DOB: [birth year] , 75 y.o.   MRN: [id number]   Chief Complaint  Patient presents with   Annual Exam     HPI  Pt presents today for HM.  Last night pts son called EMS, they did an EKG on her. She reports having chest pain for a couple of days. When EMS came it was recommended she go to the hospital for evaluation. Her son also stayed with her during that time and overnight. She states "I may go to the hospital later today if I do not feel better."  States "I know if I go to the ER they will keep me."  She does have Cardiologist with Dr. Ellyn Hack. Pt has a antibiotic that needs to be sent for dental work, not listed in her chart needing a refill.   Patient has clothes on does not want to undress for physical exam  Hypertension This is a chronic problem. The current episode started more than 1 year ago. The problem is unchanged. The problem is controlled. Associated symptoms include chest pain and shortness of breath (last night). Pertinent negatives include no anxiety, malaise/fatigue or palpitations. There are no associated agents to hypertension. Risk factors for coronary artery disease include obesity, sedentary lifestyle and post-menopausal state. Past treatments include ACE inhibitors and diuretics. There are no compliance problems.  There is no history of angina. There is no history of chronic renal disease.    Past Medical History:   Diagnosis Date   Anxiety    Arthritis    "all over"   Asthma    Bladder cancer ([place])    Chronic bronchitis ([place])    'get it q yr"   Chronic lower back pain    Coronary artery disease, non-occlusive 2005   Cardiac catheter 2005 and 2012 for "anginal symptoms "showed angiographically normal coronary arteries. -->  Negative Myoview June 2016.  Coronary calcification noted on chest CT -> calcium score 131, moderate proximal LAD and LCx disease on coronary CTA - (2019)   Daily headache    Deafness in left ear    Depression    DVT (deep venous thrombosis) ([place]) 2009/2010   "probably left leg"    GERD (gastroesophageal reflux disease)    History of kidney stones    Hyperlipidemia with target LDL less than 100    Hypertension    Nummular eczema    Skin cancer    "right abdomen"     Family History  Problem Relation Age of Onset   Heart failure Mother    Cancer Mother    Hypertension Mother    Hypertension Father    Heart attack Father        Noted on cardiology intake form as "massive heart attack" at age 25   Heart failure Sister    Heart attack Sister        Not listed on her cardiology intake form   Breast cancer Sister    Heart failure  Sister    Heart attack Sister        Not listed on her cardiology intake   Cancer Sister    Breast cancer Sister      Current Outpatient Medications:    acetaminophen (TYLENOL) 500 MG tablet, Take 1,000 mg by mouth 2 (two) times daily as needed for moderate pain or headache. , Disp: , Rfl:    diphenhydrAMINE (BENADRYL) 25 mg capsule, Take 25 mg by mouth daily as needed for allergies., Disp: , Rfl:    FLUoxetine (PROZAC) 40 MG capsule, Take 40 mg by mouth daily as needed (depression)., Disp: , Rfl:    furosemide (LASIX) 20 MG tablet, Take 1 tablet (20 mg total) by mouth daily. And may take a n additional 20 mg as needed for shortness of breathe or edema( swelling), Disp: 100 tablet, Rfl: 3   metoprolol succinate (TOPROL XL) 25 MG 24 hr  tablet, Take 1 tablet (25 mg total) by mouth daily., Disp: 90 tablet, Rfl: 3   simvastatin (ZOCOR) 80 MG tablet, TAKE 1 TABLET(80 MG) BY MOUTH AT BEDTIME, Disp: 90 tablet, Rfl: 1   trandolapril (MAVIK) 2 MG tablet, Take 1 tablet (2 mg total) by mouth daily., Disp: 90 tablet, Rfl: 1   valACYclovir (VALTREX) 1000 MG tablet, TAKE 1/2 TABLET BY MOUTH THREE TIMES DAILY AS NEEDED FOR OUTBREAK, Disp: 135 tablet, Rfl: 1   amLODipine (NORVASC) 2.5 MG tablet, Take 1 tablet (2.5 mg total) by mouth every evening., Disp: 90 tablet, Rfl: 3   ezetimibe (ZETIA) 10 MG tablet, Take 1 tablet (10 mg total) by mouth daily., Disp: 90 tablet, Rfl: 3   nystatin powder, APPLY TOPICALLY 4 TIMES A DAY, Disp: 30 g, Rfl: 1   oxyCODONE-acetaminophen (PERCOCET) 10-325 MG tablet, Take 1 tablet by mouth every 6 (six) hours as needed for pain., Disp: 20 tablet, Rfl: 0   Allergies  Allergen Reactions   Penicillins Shortness Of Breath, Itching, Swelling, Rash and Other (See Comments)    Has patient had a PCN reaction causing immediate rash, facial/tongue/throat swelling, SOB or lightheadedness with hypotension: Yes Has patient had a PCN reaction causing severe rash involving mucus membranes or skin necrosis: No Has patient had a PCN reaction that required hospitalization: Yes Has patient had a PCN reaction occurring within the last 10 years: No If all of the above answers are "NO", then may proceed with Cephalosporin use.    Azithromycin Itching, Swelling and Rash   Levaquin [Levofloxacin In D5w] Hives, Itching, Swelling and Rash   Clindamycin/Lincomycin Hives   Erythromycin Base Itching   Keflex [Cephalexin] Itching and Rash   Sulfa Antibiotics Itching and Rash   Tetracyclines & Related Itching and Rash      The patient states she is  status post hysterectomy .  Negative for Dysmenorrhea and Negative for Menorrhagia. Negative for: breast discharge, breast lump(s), breast pain and breast self exam. Associated symptoms  include abnormal vaginal bleeding. Pertinent negatives include abnormal bleeding (hematology), anxiety, decreased libido, depression, difficulty falling sleep, dyspareunia, history of infertility, nocturia, sexual dysfunction, sleep disturbances, urinary incontinence, urinary urgency, vaginal discharge and vaginal itching. Diet regular; avoids fried foods - bakes everything. She drinks a lot of pepsi and coffee. She drinks about 3-4 glasses of water a day average. The patient states her exercise level is moderate - walking up to one hour daily.   The patient's tobacco use is:  Social History   Tobacco Use  Smoking Status Never  Smokeless Tobacco Never  She has been exposed to passive smoke. The patient's alcohol use is:  Social History   Substance and Sexual Activity  Alcohol Use No    Review of Systems  Constitutional: Negative.  Negative for malaise/fatigue.  HENT: Negative.    Eyes: Negative.   Respiratory:  Positive for shortness of breath (last night).   Cardiovascular:  Positive for chest pain. Negative for palpitations.  Gastrointestinal: Negative.   Endocrine: Negative.   Genitourinary: Negative.   Musculoskeletal: Negative.   Skin: Negative.   Allergic/Immunologic: Negative.   Neurological: Negative.   Hematological: Negative.   Psychiatric/Behavioral: Negative.      Today's Vitals   04/05/21 0946 04/05/21 1011  BP: (!) 148/72 134/70  Pulse: 99   Temp: 98.4 F (36.9 C)   Weight: 202 lb 3.2 oz (91.7 kg)   Height: $Remove'5\' 2"'iGamuNH$  (1.575 m)    Body mass index is 36.98 kg/m.  Wt Readings from Last 3 Encounters:  04/05/21 202 lb 3.2 oz (91.7 kg)  12/20/20 208 lb 6.4 oz (94.5 kg)  12/09/20 205 lb (93 kg)    Objective:  Physical Exam Vitals reviewed.  Constitutional:      General: She is not in acute distress.    Appearance: Normal appearance. She is well-developed. She is obese.  HENT:     Head: Normocephalic and atraumatic.     Right Ear: Hearing, tympanic membrane,  ear canal and external ear normal. There is no impacted cerumen.     Left Ear: Hearing, tympanic membrane, ear canal and external ear normal. There is no impacted cerumen.     Nose:     Comments: Deferred - masked    Mouth/Throat:     Comments: Deferred - masked Eyes:     General: Lids are normal.     Extraocular Movements: Extraocular movements intact.     Conjunctiva/sclera: Conjunctivae normal.     Pupils: Pupils are equal, round, and reactive to light.     Funduscopic exam:    Right eye: No papilledema.        Left eye: No papilledema.  Neck:     Thyroid: No thyroid mass.     Vascular: No carotid bruit.  Cardiovascular:     Rate and Rhythm: Normal rate and regular rhythm.     Pulses: Normal pulses.     Heart sounds: Normal heart sounds. No murmur heard. Pulmonary:     Effort: Pulmonary effort is normal. No respiratory distress.     Breath sounds: Normal breath sounds. No wheezing.  Abdominal:     General: Abdomen is flat. Bowel sounds are normal. There is no distension.     Palpations: Abdomen is soft.     Tenderness: There is no abdominal tenderness.  Genitourinary:    Rectum: Guaiac result negative.  Musculoskeletal:        General: No swelling (bilateral knees are swollen). Normal range of motion.     Cervical back: Full passive range of motion without pain, normal range of motion and neck supple.     Right lower leg: Edema (nonpitting) present.     Left lower leg: Edema (nonpitting) present.  Skin:    General: Skin is warm and dry.     Capillary Refill: Capillary refill takes less than 2 seconds.  Neurological:     General: No focal deficit present.     Mental Status: She is alert and oriented to person, place, and time.     Cranial Nerves: No cranial nerve deficit.  Sensory: No sensory deficit.     Motor: No weakness.  Psychiatric:        Mood and Affect: Mood normal.        Behavior: Behavior normal.        Thought Content: Thought content normal.         Judgment: Judgment normal.        Assessment And Plan:     1. Health maintenance examination Behavior modifications discussed and diet history reviewed.   Pt will continue to exercise regularly and modify diet with low GI, plant based foods and decrease intake of processed foods.  Recommend intake of daily multivitamin, Vitamin D, and calcium.  Recommend mammogram and colonoscopy for preventive screenings, as well as recommend immunizations that include influenza, TDAP, and Shingles (up to date) - CMP14+EGFR - CBC - Hemoglobin A1c - Lipid panel  2. Class 1 obesity with serious comorbidity and body mass index (BMI) of 34.0 to 34.9 in adult, unspecified obesity type Chronic Discussed healthy diet and regular exercise options  Encouraged to exercise at least 150 minutes per week as tolerated  3. Essential hypertension Comments: Blood pressure is better controlled today, she is encouraged if her blood pressure increases as it was last night to go to ER Expressed to patient if she has a significantly elevated blood pressure with chest pain she should be seen in the ER for full evaluation - POCT Urinalysis Dipstick (81002) - POCT UA - Microalbumin  4. Mixed hyperlipidemia Comments: Stable, continue current medications  5. Muscle cramps Comments: Will check magnesium Encouraged to take magnesium supplement - Magnesium  6. Chronic pain of left knee Comments: Will provide a limited amount of percocet, encouraged to contact orthopedics if continues to have pain  - oxyCODONE-acetaminophen (PERCOCET) 10-325 MG tablet; Take 1 tablet by mouth every 6 (six) hours as needed for pain.  Dispense: 20 tablet; Refill: 0  7. Rash and nonspecific skin eruption Comments: No current rash however has intermittent erythema underneath breast - nystatin powder; APPLY TOPICALLY 4 TIMES A DAY  Dispense: 30 g; Refill: 1    Patient was given opportunity to ask questions. Patient verbalized understanding  of the plan and was able to repeat key elements of the plan. All questions were answered to their satisfaction.   Peggy Brine, FNP   I, Peggy Brine, FNP, have reviewed all documentation for this visit. The documentation on 04/11/21 for the exam, diagnosis, procedures, and orders are all accurate and complete.  THE PATIENT IS ENCOURAGED TO PRACTICE SOCIAL DISTANCING DUE TO THE COVID-19 PANDEMIC.

## 2021-04-05 NOTE — Patient Instructions (Signed)
Health Maintenance, Female Adopting a healthy lifestyle and getting preventive care are important in promoting health and wellness. Ask your health care provider about: The right schedule for you to have regular tests and exams. Things you can do on your own to prevent diseases and keep yourself healthy. What should I know about diet, weight, and exercise? Eat a healthy diet  Eat a diet that includes plenty of vegetables, fruits, low-fat dairy products, and lean protein. Do not eat a lot of foods that are high in solid fats, added sugars, or sodium.  Maintain a healthy weight Body mass index (BMI) is used to identify weight problems. It estimates body fat based on height and weight. Your health care provider can help determineyour BMI and help you achieve or maintain a healthy weight. Get regular exercise Get regular exercise. This is one of the most important things you can do for your health. Most adults should: Exercise for at least 150 minutes each week. The exercise should increase your heart rate and make you sweat (moderate-intensity exercise). Do strengthening exercises at least twice a week. This is in addition to the moderate-intensity exercise. Spend less time sitting. Even light physical activity can be beneficial. Watch cholesterol and blood lipids Have your blood tested for lipids and cholesterol at 77 years of age, then havethis test every 5 years. Have your cholesterol levels checked more often if: Your lipid or cholesterol levels are high. You are older than 77 years of age. You are at high risk for heart disease. What should I know about cancer screening? Depending on your health history and family history, you may need to have cancer screening at various ages. This may include screening for: Breast cancer. Cervical cancer. Colorectal cancer. Skin cancer. Lung cancer. What should I know about heart disease, diabetes, and high blood pressure? Blood pressure and heart  disease High blood pressure causes heart disease and increases the risk of stroke. This is more likely to develop in people who have high blood pressure readings, are of African descent, or are overweight. Have your blood pressure checked: Every 3-5 years if you are 18-39 years of age. Every year if you are 40 years old or older. Diabetes Have regular diabetes screenings. This checks your fasting blood sugar level. Have the screening done: Once every three years after age 40 if you are at a normal weight and have a low risk for diabetes. More often and at a younger age if you are overweight or have a high risk for diabetes. What should I know about preventing infection? Hepatitis B If you have a higher risk for hepatitis B, you should be screened for this virus. Talk with your health care provider to find out if you are at risk forhepatitis B infection. Hepatitis C Testing is recommended for: Everyone born from 1945 through 1965. Anyone with known risk factors for hepatitis C. Sexually transmitted infections (STIs) Get screened for STIs, including gonorrhea and chlamydia, if: You are sexually active and are younger than 77 years of age. You are older than 77 years of age and your health care provider tells you that you are at risk for this type of infection. Your sexual activity has changed since you were last screened, and you are at increased risk for chlamydia or gonorrhea. Ask your health care provider if you are at risk. Ask your health care provider about whether you are at high risk for HIV. Your health care provider may recommend a prescription medicine to help   prevent HIV infection. If you choose to take medicine to prevent HIV, you should first get tested for HIV. You should then be tested every 3 months for as long as you are taking the medicine. Pregnancy If you are about to stop having your period (premenopausal) and you may become pregnant, seek counseling before you get  pregnant. Take 400 to 800 micrograms (mcg) of folic acid every day if you become pregnant. Ask for birth control (contraception) if you want to prevent pregnancy. Osteoporosis and menopause Osteoporosis is a disease in which the bones lose minerals and strength with aging. This can result in bone fractures. If you are 65 years old or older, or if you are at risk for osteoporosis and fractures, ask your health care provider if you should: Be screened for bone loss. Take a calcium or vitamin D supplement to lower your risk of fractures. Be given hormone replacement therapy (HRT) to treat symptoms of menopause. Follow these instructions at home: Lifestyle Do not use any products that contain nicotine or tobacco, such as cigarettes, e-cigarettes, and chewing tobacco. If you need help quitting, ask your health care provider. Do not use street drugs. Do not share needles. Ask your health care provider for help if you need support or information about quitting drugs. Alcohol use Do not drink alcohol if: Your health care provider tells you not to drink. You are pregnant, may be pregnant, or are planning to become pregnant. If you drink alcohol: Limit how much you use to 0-1 drink a day. Limit intake if you are breastfeeding. Be aware of how much alcohol is in your drink. In the [place] one drink equals one 12 oz bottle of beer (355 mL), one 5 oz glass of wine (148 mL), or one 1 oz glass of hard liquor (44 mL). General instructions Schedule regular health, dental, and eye exams. Stay current with your vaccines. Tell your health care provider if: You often feel depressed. You have ever been abused or do not feel safe at home. Summary Adopting a healthy lifestyle and getting preventive care are important in promoting health and wellness. Follow your health care provider's instructions about healthy diet, exercising, and getting tested or screened for diseases. Follow your health care provider's  instructions on monitoring your cholesterol and blood pressure. This information is not intended to replace advice given to you by your health care provider. Make sure you discuss any questions you have with your healthcare provider. Document Revised: 08/14/2018 Document Reviewed: 08/14/2018 Elsevier Patient Education  2022 [employer]  

## 2021-04-05 NOTE — Telephone Encounter (Signed)
Returned the call to the patient. She stated that last night she had chest pain which radiated to her back. The pain occurred when she stood up. She stated that she also felt dizzy so her son called EMS.   EMS stated that her blood pressure was 200/100. They advised that she go to the hospital but she refused.   She saw her PCP today. Labs were drawn (in epic) She stated that her PCP advised that she take Magnesium which the patient stated has helped with the chest discomfort.   Myoview completed in April was normal.   The patient currently denies any chest pain and stated that she does feel better after taking the Magnesium. Patient made aware of ED precautions should new or worsening symptoms occur. Patient verbalized understanding.

## 2021-04-05 NOTE — Telephone Encounter (Signed)
Patient's son states for the past month the patient has been experiencing chest pain on and off. He states last night she developed cramping in her chest and became nauseous. He states her BP was 200/100 and her HR was around 99 and they called EMS. This morning the patient saw her PCP and they recommended that she follow up with cardiology for advisement. He states the patient also has cold sweats.   Pt c/o of Chest Pain: STAT if CP now or developed within 24 hours  1. Are you having CP right now?  Not currently   2. Are you experiencing any other symptoms (ex. SOB, nausea, vomiting, sweating)?  Nausea, cold sweats, cramping  3. How long have you been experiencing CP?  About 1 month  4. Is your CP continuous or coming and going?  Coming and going   5. Have you taken Nitroglycerin?  No, patient's son states she does not have any nitro. ?

## 2021-04-06 LAB — HEMOGLOBIN A1C
Est. average glucose Bld gHb Est-mCnc: 111 mg/dL
Hgb A1c MFr Bld: 5.5 % (ref 4.8–5.6)

## 2021-04-06 LAB — CBC
Hematocrit: 35.3 % (ref 34.0–46.6)
Hemoglobin: 11.3 g/dL (ref 11.1–15.9)
MCH: 28.5 pg (ref 26.6–33.0)
MCHC: 32 g/dL (ref 31.5–35.7)
MCV: 89 fL (ref 79–97)
Platelets: 342 10*3/uL (ref 150–450)
RBC: 3.97 x10E6/uL (ref 3.77–5.28)
RDW: 14.4 % (ref 11.7–15.4)
WBC: 9.9 10*3/uL (ref 3.4–10.8)

## 2021-04-06 LAB — CMP14+EGFR
ALT: 14 IU/L (ref 0–32)
AST: 21 IU/L (ref 0–40)
Albumin/Globulin Ratio: 1.5 (ref 1.2–2.2)
Albumin: 4.5 g/dL (ref 3.7–4.7)
Alkaline Phosphatase: 152 IU/L — ABNORMAL HIGH (ref 44–121)
BUN/Creatinine Ratio: 13 (ref 12–28)
BUN: 14 mg/dL (ref 8–27)
Bilirubin Total: 0.5 mg/dL (ref 0.0–1.2)
CO2: 25 mmol/L (ref 20–29)
Calcium: 9.2 mg/dL (ref 8.7–10.3)
Chloride: 97 mmol/L (ref 96–106)
Creatinine, Ser: 1.11 mg/dL — ABNORMAL HIGH (ref 0.57–1.00)
Globulin, Total: 3.1 g/dL (ref 1.5–4.5)
Glucose: 80 mg/dL (ref 65–99)
Potassium: 4.3 mmol/L (ref 3.5–5.2)
Sodium: 139 mmol/L (ref 134–144)
Total Protein: 7.6 g/dL (ref 6.0–8.5)
eGFR: 51 mL/min/{1.73_m2} — ABNORMAL LOW (ref >59)

## 2021-04-06 LAB — LIPID PANEL
Chol/HDL Ratio: 2.2 ratio (ref 0.0–4.4)
Cholesterol, Total: 157 mg/dL (ref 100–199)
HDL: 73 mg/dL (ref >39)
LDL Chol Calc (NIH): 66 mg/dL (ref 0–99)
Triglycerides: 101 mg/dL (ref 0–149)
VLDL Cholesterol Cal: 18 mg/dL (ref 5–40)

## 2021-04-06 LAB — MAGNESIUM: Magnesium: 1.9 mg/dL (ref 1.6–2.3)

## 2021-04-07 NOTE — Telephone Encounter (Signed)
Not sure what to make of the episode.  Her blood pressure seem to be pretty high.  Her chest pain is quite often musculoskeletal in nature, over the blood pressure is concerning.  If she has an episode where her blood pressure is that elevated again my recommendation will be to for her to take an extra dose of amlodipine and then recheck in an hour.  Glenetta Hew, MD

## 2021-04-08 NOTE — Telephone Encounter (Signed)
The patient has been made aware of Dr. Allison Quarry recommendations. If she needs anything further, she has been advised to call.

## 2021-04-08 NOTE — Telephone Encounter (Signed)
Left a message for the patient to call back.  

## 2021-04-11 ENCOUNTER — Encounter: Payer: Self-pay | Admitting: Nurse Practitioner

## 2021-04-26 ENCOUNTER — Other Ambulatory Visit: Payer: Self-pay | Admitting: Nurse Practitioner

## 2021-05-04 ENCOUNTER — Telehealth: Payer: Self-pay | Admitting: Nurse Practitioner

## 2021-05-04 NOTE — Telephone Encounter (Signed)
Left message asking patient to call I gave both office number and my number [phone]  Patient has AWV scheduled 08/18/21.  I wanted to see if appointment could be seen sooner  [facility] calendar year

## 2021-05-06 ENCOUNTER — Other Ambulatory Visit: Payer: Self-pay | Admitting: Cardiology

## 2021-05-26 ENCOUNTER — Other Ambulatory Visit: Payer: Self-pay

## 2021-05-26 ENCOUNTER — Ambulatory Visit (INDEPENDENT_AMBULATORY_CARE_PROVIDER_SITE_OTHER): Payer: Medicare HMO

## 2021-05-26 VITALS — BP 126/60 | HR 84 | Temp 99.1°F | Ht 61.6 in | Wt 205.6 lb

## 2021-05-26 DIAGNOSIS — Z0000 Encounter for general adult medical examination without abnormal findings: Secondary | ICD-10-CM | POA: Diagnosis not present

## 2021-05-26 NOTE — Patient Instructions (Signed)
Peggy [name] , Thank you for taking time to come for your Medicare Wellness Visit. I appreciate your ongoing commitment to your health goals. Please review the following plan we discussed and let me know if I can assist you in the future.   Screening recommendations/referrals: Colonoscopy: completed 12/06/2017 Mammogram: not required Bone Density: completed 10/10/2018 Recommended yearly ophthalmology/optometry visit for glaucoma screening and checkup Recommended yearly dental visit for hygiene and checkup  Vaccinations: Influenza vaccine: due Pneumococcal vaccine: completed 09/03/2018 Tdap vaccine: completed 11/19/2017, due 11/20/2027 Shingles vaccine: completed   Covid-19: 12/29/20, 06/16/2020, 10/24/2019, 09/23/2019  Advanced directives: Advance directive discussed with you today. Even though you declined this today please call our office should you change your mind and we can give you the proper paperwork for you to fill out.  Conditions/risks identified: none  Next appointment: Follow up in one year for your annual wellness visit    Preventive Care 65 Years and Older, Female Preventive care refers to lifestyle choices and visits with your health care provider that can promote health and wellness. What does preventive care include? A yearly physical exam. This is also called an annual well check. Dental exams once or twice a year. Routine eye exams. Ask your health care provider how often you should have your eyes checked. Personal lifestyle choices, including: Daily care of your teeth and gums. Regular physical activity. Eating a healthy diet. Avoiding tobacco and drug use. Limiting alcohol use. Practicing safe sex. Taking low-dose aspirin every day. Taking vitamin and mineral supplements as recommended by your health care provider. What happens during an annual well check? The services and screenings done by your health care provider during your annual well check will depend on your  age, overall health, lifestyle risk factors, and family history of disease. Counseling  Your health care provider may ask you questions about your: Alcohol use. Tobacco use. Drug use. Emotional well-being. Home and relationship well-being. Sexual activity. Eating habits. History of falls. Memory and ability to understand (cognition). Work and work Statistician. Reproductive health. Screening  You may have the following tests or measurements: Height, weight, and BMI. Blood pressure. Lipid and cholesterol levels. These may be checked every 5 years, or more frequently if you are over 46 years old. Skin check. Lung cancer screening. You may have this screening every year starting at age 24 if you have a 30-pack-year history of smoking and currently smoke or have quit within the past 15 years. Fecal occult blood test (FOBT) of the stool. You may have this test every year starting at age 73. Flexible sigmoidoscopy or colonoscopy. You may have a sigmoidoscopy every 5 years or a colonoscopy every 10 years starting at age 61. Hepatitis C blood test. Hepatitis B blood test. Sexually transmitted disease (STD) testing. Diabetes screening. This is done by checking your blood sugar (glucose) after you have not eaten for a while (fasting). You may have this done every 1-3 years. Bone density scan. This is done to screen for osteoporosis. You may have this done starting at age 41. Mammogram. This may be done every 1-2 years. Talk to your health care provider about how often you should have regular mammograms. Talk with your health care provider about your test results, treatment options, and if necessary, the need for more tests. Vaccines  Your health care provider may recommend certain vaccines, such as: Influenza vaccine. This is recommended every year. Tetanus, diphtheria, and acellular pertussis (Tdap, Td) vaccine. You may need a Td booster every 10 years.  Zoster vaccine. You may need this after  age 19. Pneumococcal 13-valent conjugate (PCV13) vaccine. One dose is recommended after age 32. Pneumococcal polysaccharide (PPSV23) vaccine. One dose is recommended after age 29. Talk to your health care provider about which screenings and vaccines you need and how often you need them. This information is not intended to replace advice given to you by your health care provider. Make sure you discuss any questions you have with your health care provider. Document Released: 09/17/2015 Document Revised: 05/10/2016 Document Reviewed: 06/22/2015 Elsevier Interactive Patient Education  2017 [employer] Prevention in the Home Falls can cause injuries. They can happen to people of all ages. There are many things you can do to make your home safe and to help prevent falls. What can I do on the outside of my home? Regularly fix the edges of walkways and driveways and fix any cracks. Remove anything that might make you trip as you walk through a door, such as a raised step or threshold. Trim any bushes or trees on the path to your home. Use bright outdoor lighting. Clear any walking paths of anything that might make someone trip, such as rocks or tools. Regularly check to see if handrails are loose or broken. Make sure that both sides of any steps have handrails. Any raised decks and porches should have guardrails on the edges. Have any leaves, snow, or ice cleared regularly. Use sand or salt on walking paths during winter. Clean up any spills in your garage right away. This includes oil or grease spills. What can I do in the bathroom? Use night lights. Install grab bars by the toilet and in the tub and shower. Do not use towel bars as grab bars. Use non-skid mats or decals in the tub or shower. If you need to sit down in the shower, use a plastic, non-slip stool. Keep the floor dry. Clean up any water that spills on the floor as soon as it happens. Remove soap buildup in the tub or shower  regularly. Attach bath mats securely with double-sided non-slip rug tape. Do not have throw rugs and other things on the floor that can make you trip. What can I do in the bedroom? Use night lights. Make sure that you have a light by your bed that is easy to reach. Do not use any sheets or blankets that are too big for your bed. They should not hang down onto the floor. Have a firm chair that has side arms. You can use this for support while you get dressed. Do not have throw rugs and other things on the floor that can make you trip. What can I do in the kitchen? Clean up any spills right away. Avoid walking on wet floors. Keep items that you use a lot in easy-to-reach places. If you need to reach something above you, use a strong step stool that has a grab bar. Keep electrical cords out of the way. Do not use floor polish or wax that makes floors slippery. If you must use wax, use non-skid floor wax. Do not have throw rugs and other things on the floor that can make you trip. What can I do with my stairs? Do not leave any items on the stairs. Make sure that there are handrails on both sides of the stairs and use them. Fix handrails that are broken or loose. Make sure that handrails are as long as the stairways. Check any carpeting to make sure that  it is firmly attached to the stairs. Fix any carpet that is loose or worn. Avoid having throw rugs at the top or bottom of the stairs. If you do have throw rugs, attach them to the floor with carpet tape. Make sure that you have a light switch at the top of the stairs and the bottom of the stairs. If you do not have them, ask someone to add them for you. What else can I do to help prevent falls? Wear shoes that: Do not have high heels. Have rubber bottoms. Are comfortable and fit you well. Are closed at the toe. Do not wear sandals. If you use a stepladder: Make sure that it is fully opened. Do not climb a closed stepladder. Make sure that  both sides of the stepladder are locked into place. Ask someone to hold it for you, if possible. Clearly mark and make sure that you can see: Any grab bars or handrails. First and last steps. Where the edge of each step is. Use tools that help you move around (mobility aids) if they are needed. These include: Canes. Walkers. Scooters. Crutches. Turn on the lights when you go into a dark area. Replace any light bulbs as soon as they burn out. Set up your furniture so you have a clear path. Avoid moving your furniture around. If any of your floors are uneven, fix them. If there are any pets around you, be aware of where they are. Review your medicines with your doctor. Some medicines can make you feel dizzy. This can increase your chance of falling. Ask your doctor what other things that you can do to help prevent falls. This information is not intended to replace advice given to you by your health care provider. Make sure you discuss any questions you have with your health care provider. Document Released: 06/17/2009 Document Revised: 01/27/2016 Document Reviewed: 09/25/2014 Elsevier Interactive Patient Education  2017 [employer].

## 2021-05-26 NOTE — Telephone Encounter (Signed)
Our office received a duplicate clearance request today, original clearance was faxed to [place] on 02/14/21. See previous notes as pt was cleared 02/15/21. Pt then however had chest pain in 04/2021, see notes in chart. I d/w pre op provider today who has asked for me to please resend original clearance noting that this is being re-sent to the pre op pool. Pre op provider will reach out to the pt for assessment as well confirm if procedure was done back 02/2021.

## 2021-05-26 NOTE — Telephone Encounter (Signed)
Left voice mail to call back 

## 2021-05-26 NOTE — Progress Notes (Signed)
This visit occurred during the SARS-CoV-2 public health emergency.  Safety protocols were in place, including screening questions prior to the visit, additional usage of staff PPE, and extensive cleaning of exam room while observing appropriate contact time as indicated for disinfecting solutions.  Subjective:   Peggy [name] is a 75 y.o. female who presents for Medicare Annual (Subsequent) preventive examination.  Review of Systems     Cardiac Risk Factors include: advanced age (>3men, >22 women);dyslipidemia;hypertension;obesity (BMI >30kg/m2);sedentary lifestyle     Objective:    Today's Vitals   05/26/21 1019  BP: 138/60  Pulse: 84  Temp: 99.1 F (37.3 C)  TempSrc: Oral  SpO2: 97%  Weight: 205 lb 9.6 oz (93.3 kg)  Height: 5' 1.6" (1.565 m)   Body mass index is 38.09 kg/m.  Advanced Directives 05/26/2021 08/18/2020 12/10/2019 08/13/2019 07/15/2019 07/30/2018 12/14/2017  Does Patient Have a Medical Advance Directive? No No No Yes No No No  Type of Advance Directive - - - [place] in Chart? - - - No - copy requested - - -  Would patient like information on creating a medical advance directive? No - Patient declined - - - No - Patient declined Yes (MAU/Ambulatory/Procedural Areas - Information given) No - Patient declined    Current Medications (verified) Outpatient Encounter Medications as of 05/26/2021  Medication Sig   acetaminophen (TYLENOL) 500 MG tablet Take 1,000 mg by mouth 2 (two) times daily as needed for moderate pain or headache.    diphenhydrAMINE (BENADRYL) 25 mg capsule Take 25 mg by mouth daily as needed for allergies.   FLUoxetine (PROZAC) 40 MG capsule Take 40 mg by mouth daily as needed (depression).   furosemide (LASIX) 20 MG tablet Take 1 tablet (20 mg total) by mouth daily. And may take a n additional 20 mg as needed for shortness of breathe or edema( swelling)   metoprolol succinate (TOPROL-XL)  25 MG 24 hr tablet TAKE 1 TABLET(25 MG) BY MOUTH DAILY   simvastatin (ZOCOR) 80 MG tablet TAKE 1 TABLET(80 MG) BY MOUTH AT BEDTIME   trandolapril (MAVIK) 2 MG tablet Take 1 tablet (2 mg total) by mouth daily.   valACYclovir (VALTREX) 1000 MG tablet TAKE 1/2 TABLET BY MOUTH THREE TIMES DAILY AS NEEDED FOR OUTBREAK   amLODipine (NORVASC) 2.5 MG tablet Take 1 tablet (2.5 mg total) by mouth every evening.   ezetimibe (ZETIA) 10 MG tablet Take 1 tablet (10 mg total) by mouth daily.   nystatin powder APPLY TOPICALLY 4 TIMES A DAY (Patient not taking: Reported on 05/26/2021)   oxyCODONE-acetaminophen (PERCOCET) 10-325 MG tablet Take 1 tablet by mouth every 6 (six) hours as needed for pain. (Patient not taking: Reported on 05/26/2021)   No facility-administered encounter medications on file as of 05/26/2021.    Allergies (verified) Penicillins, Azithromycin, Levaquin [levofloxacin in d5w], Clindamycin/lincomycin, Erythromycin base, Keflex [cephalexin], Sulfa antibiotics, and Tetracyclines & related   History: Past Medical History:  Diagnosis Date   Anxiety    Arthritis    "all over"   Asthma    Bladder cancer ([place])    Chronic bronchitis ([place])    'get it q yr"   Chronic lower back pain    Coronary artery disease, non-occlusive 2005   Cardiac catheter 2005 and 2012 for "anginal symptoms "showed angiographically normal coronary arteries. -->  Negative Myoview June 2016.  Coronary calcification noted on chest CT -> calcium score 131, moderate proximal  LAD and LCx disease on coronary CTA - (2019)   Daily headache    Deafness in left ear    Depression    DVT (deep venous thrombosis) ([place]) 2009/2010   "probably left leg"    GERD (gastroesophageal reflux disease)    History of kidney stones    Hyperlipidemia with target LDL less than 100    Hypertension    Nummular eczema    Skin cancer    "right abdomen"   Past Surgical History:  Procedure Laterality Date   ABDOMINAL HYSTERECTOMY  1980's    APPENDECTOMY Right    BACK SURGERY     x 2, lower   BLADDER TUMOR EXCISION     BREAST BIOPSY Right    BREAST EXCISIONAL BIOPSY Right    CAROTID ARTERY DOPPLERS   10/2017   Carotid Dopplers: 40-59% stenosis on the Right Internal Carotid, < 40% stenosis in the Left.  Both vertebral arteries show normal flow.  Left subclavian artery is normal, but there is some flow disturbance in the right subclavian artery.   COLONOSCOPY WITH PROPOFOL N/A 02/26/2015   Procedure: COLONOSCOPY WITH PROPOFOL;  Surgeon: Carol Ada, MD;  Location: [facility];  Service: Endoscopy;  Laterality: N/A;   COLONOSCOPY WITH PROPOFOL N/A 12/06/2017   Procedure: COLONOSCOPY WITH PROPOFOL;  Surgeon: Juanita Craver, MD;  Location: [facility];  Service: Endoscopy;  Laterality: N/A;   CORONARY CALCIUM SCORE & CT ANGIOGRAM  11/2017   Coronary calcium score 131 (moderate risk).  Moderate proximal LAD & LCx stenosis --> LOW RISK STUDY.  No evidence to suspect cardiac related chest pain --> correlates with cardiac catheterizations   ENTEROSCOPY N/A 12/14/2017   Procedure: ENTEROSCOPY;  Surgeon: Carol Ada, MD;  Location: [facility];  Service: Endoscopy;  Laterality: N/A;   ESOPHAGOGASTRODUODENOSCOPY Left 02/20/2015   Procedure: ESOPHAGOGASTRODUODENOSCOPY (EGD);  Surgeon: Milus Banister, MD;  Location: [facility];  Service: Endoscopy;  Laterality: Left;   ESOPHAGOGASTRODUODENOSCOPY (EGD) WITH PROPOFOL N/A 12/06/2017   Procedure: ESOPHAGOGASTRODUODENOSCOPY (EGD) WITH PROPOFOL;  Surgeon: Juanita Craver, MD;  Location: [facility];  Service: Endoscopy;  Laterality: N/A;   HOT HEMOSTASIS N/A 12/14/2017   Procedure: HOT HEMOSTASIS (ARGON PLASMA COAGULATION/BICAP);  Surgeon: Carol Ada, MD;  Location: [facility];  Service: Endoscopy;  Laterality: N/A;   JOINT REPLACEMENT     KNEE ARTHROSCOPY Right ~ 2000   LEFT HEART CATH AND CORONARY ANGIOGRAPHY  11/'05; 2/'12   Angiographically normal coronary arteries   LUMBAR DISC SURGERY   1980's X 1; ~ 2002   MEDIAL PARTIAL KNEE REPLACEMENT Right 2001   NM MYOVIEW LTD  02/2015   EF 65%.  No reversible ischemia.  Possible scar along the cardiac apex and anteroseptal wall.  (Mild severity, medium-sized defect in the apex anteroseptal wall noted on both resting and stress images).  No inducible ischemia.  Read as LOW RISK   PARTIAL KNEE ARTHROPLASTY Left 2009   REPLACEMENT TOTAL KNEE BILATERAL Bilateral    REVISION TOTAL KNEE ARTHROPLASTY Left    SKIN CANCER EXCISION Right    "abdomen"   TRANSTHORACIC ECHOCARDIOGRAM  06/'16; 2/'19; 1/'21    A) mod Conc LVH. EF 60-65%.  No RWMA. GR 1 D. Mild Ao Stenosis. Mild LA dilation.;  B) EF 60-65%.  Mild LVH.  No or W MA.  No significant valve disease.;l 01/2020: Normal LV systolic function.  Mild LVH.  GR 1 DD.  EF 60 to 65%.  No R WMA.  Trace AI.  No AS   Family  History  Problem Relation Age of Onset   Heart failure Mother    Cancer Mother    Hypertension Mother    Hypertension Father    Heart attack Father        Noted on cardiology intake form as "massive heart attack" at age 77   Heart failure Sister    Heart attack Sister        Not listed on her cardiology intake form   Breast cancer Sister    Heart failure Sister    Heart attack Sister        Not listed on her cardiology intake   Cancer Sister    Breast cancer Sister    Social History   Socioeconomic History   Marital status: Single    Spouse name: Not on file   Number of children: 1   Years of education: Not on file   Highest education level: Not on file  Occupational History   Occupation: Retired    Fish farm manager: RETIRED    Comment: Oncologist   Tobacco Use   Smoking status: Never   Smokeless tobacco: Never  Scientific laboratory technician Use: Never used  Substance and Sexual Activity   Alcohol use: No   Drug use: No   Sexual activity: Not Currently  Other Topics Concern   Not on file  Social History Narrative   Relocated from [place]   She is currently "'.  She  lives with her son (who is autistic).  She herself is disabled, and not working.   She never smoked and never drank alcohol.   She enjoys walking 7 days a week.   Social Determinants of Health   Financial Resource Strain: Low Risk    Difficulty of Paying Living Expenses: Not hard at all  Food Insecurity: No Food Insecurity   Worried About Charity fundraiser in the Last Year: Never true   [place] in the Last Year: Never true  Transportation Needs: No Transportation Needs   Lack of Transportation (Medical): No   Lack of Transportation (Non-Medical): No  Physical Activity: Inactive   Days of Exercise per Week: 0 days   Minutes of Exercise per Session: 0 min  Stress: No Stress Concern Present   Feeling of Stress : Not at all  Social Connections: Not on file    Tobacco Counseling Counseling given: Not Answered   Clinical Intake:  Pre-visit preparation completed: Yes  Pain : No/denies pain     Nutritional Status: BMI > 30  Obese Nutritional Risks: None Diabetes: No  How often do you need to have someone help you when you read instructions, pamphlets, or other written materials from your doctor or pharmacy?: 1 - Never What is the last grade level you completed in school?: some college  Diabetic? no  Interpreter Needed?: No  Information entered by :: NAllen LPN   Activities of Daily Living In your present state of health, do you have any difficulty performing the following activities: 05/26/2021 08/18/2020  Hearing? Y N  Comment deaf in left ear -  Vision? N N  Difficulty concentrating or making decisions? Y N  Comment sometimes remembering -  Walking or climbing stairs? Y Y  Dressing or bathing? N N  Doing errands, shopping? N Y  Conservation officer, nature and eating ? N N  Using the Toilet? N N  In the past six months, have you accidently leaked urine? Y Y  Comment with coughing, sneezing, and  laughing if laugh too hard  Do you have problems with loss of bowel  control? N N  Managing your Medications? N N  Managing your Finances? N N  Housekeeping or managing your Housekeeping? N Y  Some recent data might be hidden    Patient Care Team: [name], FNP as PCP - General (General Practice)  Indicate any recent Medical Services you may have received from other than [facility] providers in the past year (date may be approximate).     Assessment:   This is a routine wellness examination for [employer].  Hearing/Vision screen No results found.  Dietary issues and exercise activities discussed: Current Exercise Habits: The patient does not participate in regular exercise at present   Goals Addressed             This Visit's Progress    Patient Stated       05/26/2021, no goals       Depression Screen PHQ 2/9 Scores 05/26/2021 08/18/2020 08/18/2020 08/13/2019 07/01/2019 03/31/2019 12/26/2018  PHQ - 2 Score 0 0 0 0 0 3 0  PHQ- 9 Score - - - 0 - 6 -    Fall Risk Fall Risk  05/26/2021 08/18/2020 08/18/2020 08/13/2019 07/01/2019  Falls in the past year? 1 0 0 0 0  Comment got dizzy - - - -  Number falls in past yr: 0 - - - -  Injury with Fall? 0 - - - -  Risk for fall due to : Impaired mobility;Medication side effect Medication side effect - Impaired balance/gait;Impaired mobility;Medication side effect -  Follow up Falls evaluation completed;Education provided;Falls prevention discussed Falls evaluation completed;Education provided;Falls prevention discussed - Falls evaluation completed;Education provided;Falls prevention discussed -    FALL RISK PREVENTION PERTAINING TO THE HOME:  Any stairs in or around the home? No  If so, are there any without handrails? N/a Home free of loose throw rugs in walkways, pet beds, electrical cords, etc? Yes  Adequate lighting in your home to reduce risk of falls? Yes   ASSISTIVE DEVICES UTILIZED TO PREVENT FALLS:  Life alert? No  Use of a cane, walker or w/c? Yes  Grab bars in the bathroom? No  Shower  chair or bench in shower? No  Elevated toilet seat or a handicapped toilet? No   TIMED UP AND GO:  Was the test performed? No .  .   Gait slow and steady with assistive device  Cognitive Function:     6CIT Screen 05/26/2021 08/18/2020 08/13/2019 07/30/2018  What Year? 0 points 0 points 0 points 0 points  What month? 0 points 0 points 0 points 0 points  What time? 0 points 0 points 0 points 0 points  Count back from 20 0 points 0 points 0 points 0 points  Months in reverse 0 points 0 points 0 points 0 points  Repeat phrase 0 points 0 points 0 points 0 points  Total Score 0 0 0 0    Immunizations Immunization History  Administered Date(s) Administered   Fluad Quad(high Dose 65+) 05/25/2020   Influenza, High Dose Seasonal PF 07/01/2019   Influenza-Unspecified 05/13/2018   PFIZER(Purple Top)SARS-COV-2 Vaccination 09/23/2019, 10/24/2019, 06/16/2020, 12/29/2020   Pneumococcal Conjugate-13 09/03/2018   Tdap 11/16/2017   Zoster Recombinat (Shingrix) 09/04/2016, 02/02/2017    TDAP status: Up to date  Flu Vaccine status: Due, Education has been provided regarding the importance of this vaccine. Advised may receive this vaccine at local pharmacy or Health Dept. Aware to provide a copy  of the vaccination record if obtained from local pharmacy or Health Dept. Verbalized acceptance and understanding.  Pneumococcal vaccine status: Up to date  Covid-19 vaccine status: Completed vaccines  Qualifies for Shingles Vaccine? Yes   Zostavax completed No   Shingrix Completed?: Yes  Screening Tests Health Maintenance  Topic Date Due   INFLUENZA VACCINE  04/04/2021   TETANUS/TDAP  11/17/2027   DEXA SCAN  Completed   COVID-19 Vaccine  Completed   Hepatitis C Screening  Completed   Zoster Vaccines- Shingrix  Completed   HPV VACCINES  Aged Out    Health Maintenance  Health Maintenance Due  Topic Date Due   INFLUENZA VACCINE  04/04/2021    Colorectal cancer screening: Type of  screening: Colonoscopy. Completed 12/06/2017. Repeat every 5 years  Mammogram status: decline  Bone Density status: Completed 10/10/2018.   Lung Cancer Screening: (Low Dose CT Chest recommended if Age 67-80 years, 30 pack-year currently smoking OR have quit w/in 15years.) does not qualify.   Lung Cancer Screening Referral: no  Additional Screening:  Hepatitis C Screening: does qualify; Completed 07/01/2019  Vision Screening: Recommended annual ophthalmology exams for early detection of glaucoma and other disorders of the eye. Is the patient up to date with their annual eye exam?  No  Who is the provider or what is the name of the office in which the patient attends annual eye exams? Dr. Katy Fitch If pt is not established with a provider, would they like to be referred to a provider to establish care? No .   Dental Screening: Recommended annual dental exams for proper oral hygiene  Community Resource Referral / Chronic Care Management: CRR required this visit?  No   CCM required this visit?  No      Plan:     I have personally reviewed and noted the following in the patient's chart:   Medical and social history Use of alcohol, tobacco or illicit drugs  Current medications and supplements including opioid prescriptions.  Functional ability and status Nutritional status Physical activity Advanced directives List of other physicians Hospitalizations, surgeries, and ER visits in previous 12 months Vitals Screenings to include cognitive, depression, and falls Referrals and appointments  In addition, I have reviewed and discussed with patient certain preventive protocols, quality metrics, and best practice recommendations. A written personalized care plan for preventive services as well as general preventive health recommendations were provided to patient.     Peggy BRICK, LPN   [id number]   Nurse Notes:

## 2021-05-30 ENCOUNTER — Ambulatory Visit: Payer: Self-pay | Admitting: Internal Medicine

## 2021-05-31 ENCOUNTER — Other Ambulatory Visit: Payer: Self-pay

## 2021-05-31 ENCOUNTER — Encounter: Payer: Self-pay | Admitting: Cardiology

## 2021-05-31 ENCOUNTER — Ambulatory Visit (INDEPENDENT_AMBULATORY_CARE_PROVIDER_SITE_OTHER): Payer: Medicare HMO | Admitting: Cardiology

## 2021-05-31 VITALS — BP 130/65 | HR 74 | Ht 65.0 in | Wt 207.2 lb

## 2021-05-31 DIAGNOSIS — E782 Mixed hyperlipidemia: Secondary | ICD-10-CM | POA: Diagnosis not present

## 2021-05-31 DIAGNOSIS — R079 Chest pain, unspecified: Secondary | ICD-10-CM

## 2021-05-31 DIAGNOSIS — I10 Essential (primary) hypertension: Secondary | ICD-10-CM

## 2021-05-31 DIAGNOSIS — I471 Supraventricular tachycardia: Secondary | ICD-10-CM | POA: Diagnosis not present

## 2021-05-31 DIAGNOSIS — I2510 Atherosclerotic heart disease of native coronary artery without angina pectoris: Secondary | ICD-10-CM | POA: Diagnosis not present

## 2021-05-31 DIAGNOSIS — Z01810 Encounter for preprocedural cardiovascular examination: Secondary | ICD-10-CM

## 2021-05-31 NOTE — Progress Notes (Signed)
Primary Care Provider: [name], FNP Cardiologist: Glenetta Hew, MD Electrophysiologist: None  Clinic Note: Chief Complaint  Patient presents with   Follow-up    Myoview results.    ===================================  ASSESSMENT/PLAN   Problem List Items Addressed This Visit       Cardiology Problems   Coronary artery disease, non-occlusive (Chronic)    She has had a heart catheterizations in 2005 and 2012 followed by coronary CTA showing moderate disease.  We just evaluated with a Myoview stress test in April that was nonischemic.  I do not think that her strange episodes of chest pain are true anginal symptoms.  She is on amlodipine for the possibility of spasm although it does not sound like spasm.  For preventive maintenance, she is on beta-blocker statin and Zetia Should be on aspirin      Essential hypertension (Chronic)    Stable blood pressure on amlodipine low-dose along with Toprol and Lasix.  We added amlodipine for possible coronary spasm symptoms, and beta-blockers are being used for palpitations.      Mixed hyperlipidemia (Chronic)    LDL 66, well-controlled on current dose simvastatin.  Tolerating well.  Labs checked by PCP.      PSVT (paroxysmal supraventricular tachycardia) (HCC) - Primary (Chronic)    She has short little bursts that are for the most part pretty well controlled on Toprol.  I am leery of increasing too much because the spells are not lasting very long.  If she is more symptomatic, would probably increase to 50 mg.        Other   Chest pain with low risk for cardiac etiology (Chronic)   Preop cardiovascular exam    Pending OMFS surgery.  Should be a relatively low risk surgery for a low risk patient.  She is not very active, just had a Myoview stress test that was nonischemic confirming known nonobstructive CAD.  No further ischemic evaluation warranted.  She does not have aspirin listed, although she should be on it.  As  such, there is no medication to hold for surgery.  Okay to proceed with surgery without any further evaluation..       ===================================  HPI:    Peggy [name] is a 75 y.o. female with a PMH notable for Coronary Calcification-Nonobstructive CAD by coronary CTA (negative Myoview), PAT who presents today for 35-month follow-up.  Peggy [name] was last seen on November 25, 2020 -> she was still noticing these off-and-on episodes of chest pain as well as intermittent exertional dyspnea.  Rare palpitations but vague symptoms.  Short bursts of palpitations and short-lived chest pain with or without exertion.  At baseline not very active.  Baseline fatigue/feels tired.  Does not get back to sleep when she wakes up to urinate. -> Myoview ordered to evaluate chest pain  Recent Hospitalizations:  Called EMS on 04/04/2021-had chest pain, EKG checked.  She did not want to go to the hospital.  Reviewed  CV studies:    The following studies were reviewed today: (if available, images/films reviewed: From Epic Chart or Care Everywhere) Myoview 12/09/2020: LOW RISK.  EF 55 to 65%.  No ischemia or infarct.  Interval History:   Peggy [name] returns here today still having rare symptoms.  She is just a very overall poor historian.  No prolonged spells of palpitations.  Occasional dizziness.  Rare PND symptoms but no orthopnea.  She still says she has chest pain symptoms-every now and then, but not  routinely and not with any association with rest or exertion.  Somewhat similar to what was evaluated.  Persistent L>> R LE swelling - takes extra lasix ~2-3 x /week.  Left leg is chronically weaker - since Knee Sgx.   CV Review of Symptoms (Summary) Cardiovascular ROS: positive for - multiple "unusual Sx --> still has intermittent chest pain, dizziness, sometimes worse with standing, palpitations that are short-lived, some mild orthopnea but no real PND. negative for - prolonged consistent episodes  of exertional chest pain, prolonged palpitations/arrhythmias, consistent orthopnea and PND, syncope/near syncope or TIA/amaurosis fugax.  After the patient was seen in clinic, I found out that she actually was pending OMFS surgical procedure -> preop clearance of breast  REVIEWED OF SYSTEMS   Review of Systems  Constitutional:  Positive for malaise/fatigue (tires easily -- (frequent nocturia)). Negative for weight loss.  HENT:  Negative for congestion and nosebleeds.   Respiratory:  Negative for cough and shortness of breath (occasionally).   Cardiovascular:  Positive for leg swelling.  Gastrointestinal:  Negative for blood in stool and melena.  Genitourinary:  Positive for frequency (nocturia 3-4 x / night). Negative for dysuria and hematuria.  Musculoskeletal:  Positive for back pain, falls (had a fall last moth - got dizzy standing up) and joint pain (knees; hips, L ankle). Negative for neck pain.  Neurological:  Positive for dizziness (prior to fall; "sometimes" orthostatic), focal weakness (left leg > Right) and headaches. Negative for weakness.  Psychiatric/Behavioral:  Negative for memory loss. The patient has insomnia (Unable to get back to sleep after waking up to urinate). The patient is not nervous/anxious.    I have reviewed and (if needed) personally updated the patient's problem list, medications, allergies, past medical and surgical history, social and family history.   PAST MEDICAL HISTORY   Past Medical History:  Diagnosis Date   Anxiety    Arthritis    "all over"   Asthma    Bladder cancer ([place])    Chronic bronchitis ([place])    'get it q yr"   Chronic lower back pain    Coronary artery disease, non-occlusive 2005   Cardiac catheter 2005 and 2012 for "anginal symptoms "showed angiographically normal coronary arteries. -->  Negative Myoview June 2016.  Coronary calcification noted on chest CT -> calcium score 131, moderate proximal LAD and LCx disease on coronary CTA -  (2019)   Daily headache    Deafness in left ear    Depression    DVT (deep venous thrombosis) ([place]) 2009/2010   "probably left leg"    GERD (gastroesophageal reflux disease)    History of kidney stones    Hyperlipidemia with target LDL less than 100    Hypertension    Nummular eczema    Skin cancer    "right abdomen"    PAST SURGICAL HISTORY   Past Surgical History:  Procedure Laterality Date   ABDOMINAL HYSTERECTOMY  1980's   APPENDECTOMY Right    BACK SURGERY     x 2, lower   BLADDER TUMOR EXCISION     BREAST BIOPSY Right    BREAST EXCISIONAL BIOPSY Right    CAROTID ARTERY DOPPLERS   10/2017   Carotid Dopplers: 40-59% stenosis on the Right Internal Carotid, < 40% stenosis in the Left.  Both vertebral arteries show normal flow.  Left subclavian artery is normal, but there is some flow disturbance in the right subclavian artery.   COLONOSCOPY WITH PROPOFOL N/A 02/26/2015   Procedure: COLONOSCOPY WITH  PROPOFOL;  Surgeon: Carol Ada, MD;  Location: [facility];  Service: Endoscopy;  Laterality: N/A;   COLONOSCOPY WITH PROPOFOL N/A 12/06/2017   Procedure: COLONOSCOPY WITH PROPOFOL;  Surgeon: Juanita Craver, MD;  Location: [facility];  Service: Endoscopy;  Laterality: N/A;   CORONARY CALCIUM SCORE & CT ANGIOGRAM  11/2017   Coronary calcium score 131 (moderate risk).  Moderate proximal LAD & LCx stenosis --> LOW RISK STUDY.  No evidence to suspect cardiac related chest pain --> correlates with cardiac catheterizations   ENTEROSCOPY N/A 12/14/2017   Procedure: ENTEROSCOPY;  Surgeon: Carol Ada, MD;  Location: [facility];  Service: Endoscopy;  Laterality: N/A;   ESOPHAGOGASTRODUODENOSCOPY Left 02/20/2015   Procedure: ESOPHAGOGASTRODUODENOSCOPY (EGD);  Surgeon: Milus Banister, MD;  Location: [facility];  Service: Endoscopy;  Laterality: Left;   ESOPHAGOGASTRODUODENOSCOPY (EGD) WITH PROPOFOL N/A 12/06/2017   Procedure: ESOPHAGOGASTRODUODENOSCOPY (EGD) WITH PROPOFOL;  Surgeon:  Juanita Craver, MD;  Location: [facility];  Service: Endoscopy;  Laterality: N/A;   HOT HEMOSTASIS N/A 12/14/2017   Procedure: HOT HEMOSTASIS (ARGON PLASMA COAGULATION/BICAP);  Surgeon: Carol Ada, MD;  Location: [facility];  Service: Endoscopy;  Laterality: N/A;   JOINT REPLACEMENT     KNEE ARTHROSCOPY Right ~ 2000   LEFT HEART CATH AND CORONARY ANGIOGRAPHY  11/'05; 2/'12   Angiographically normal coronary arteries   LUMBAR DISC SURGERY  1980's X 1; ~ 2002   MEDIAL PARTIAL KNEE REPLACEMENT Right 2001   NM MYOVIEW LTD  02/2015   EF 65%.  No reversible ischemia.  Possible scar along the cardiac apex and anteroseptal wall.  (Mild severity, medium-sized defect in the apex anteroseptal wall noted on both resting and stress images).  No inducible ischemia.  Read as LOW RISK   NM MYOVIEW LTD  12/09/2020   LOW RISK.  EF 55 to 65%.  No ischemia or infarct.   PARTIAL KNEE ARTHROPLASTY Left 2009   REPLACEMENT TOTAL KNEE BILATERAL Bilateral    REVISION TOTAL KNEE ARTHROPLASTY Left    SKIN CANCER EXCISION Right    "abdomen"   TRANSTHORACIC ECHOCARDIOGRAM  06/'16; 2/'19; 1/'21    A) mod Conc LVH. EF 60-65%.  No RWMA. GR 1 D. Mild Ao Stenosis. Mild LA dilation.;  B) EF 60-65%.  Mild LVH.  No or W MA.  No significant valve disease.;l 01/2020: Normal LV systolic function.  Mild LVH.  GR 1 DD.  EF 60 to 65%.  No R WMA.  Trace AI.  No AS    Immunization History  Administered Date(s) Administered   Fluad Quad(high Dose 65+) 05/25/2020   Influenza, High Dose Seasonal PF 07/01/2019   Influenza-Unspecified 05/13/2018   PFIZER(Purple Top)SARS-COV-2 Vaccination 09/23/2019, 10/24/2019, 06/16/2020, 12/29/2020   Pneumococcal Conjugate-13 09/03/2018   Tdap 11/16/2017   Zoster Recombinat (Shingrix) 09/04/2016, 02/02/2017    MEDICATIONS/ALLERGIES   Current Meds  Medication Sig   acetaminophen (TYLENOL) 500 MG tablet Take 1,000 mg by mouth 2 (two) times daily as needed for moderate pain or headache.     diphenhydrAMINE (BENADRYL) 25 mg capsule Take 25 mg by mouth daily as needed for allergies.   FLUoxetine (PROZAC) 40 MG capsule Take 40 mg by mouth daily as needed (depression).   furosemide (LASIX) 20 MG tablet Take 1 tablet (20 mg total) by mouth daily. And may take a n additional 20 mg as needed for shortness of breathe or edema( swelling)   metoprolol succinate (TOPROL-XL) 25 MG 24 hr tablet TAKE 1 TABLET(25 MG) BY MOUTH DAILY   nystatin powder  APPLY TOPICALLY 4 TIMES A DAY   oxyCODONE-acetaminophen (PERCOCET) 10-325 MG tablet Take 1 tablet by mouth every 6 (six) hours as needed for pain.   simvastatin (ZOCOR) 80 MG tablet TAKE 1 TABLET(80 MG) BY MOUTH AT BEDTIME   trandolapril (MAVIK) 2 MG tablet Take 1 tablet (2 mg total) by mouth daily.   valACYclovir (VALTREX) 1000 MG tablet TAKE 1/2 TABLET BY MOUTH THREE TIMES DAILY AS NEEDED FOR OUTBREAK    Allergies  Allergen Reactions   Penicillins Shortness Of Breath, Itching, Swelling, Rash and Other (See Comments)    Has patient had a PCN reaction causing immediate rash, facial/tongue/throat swelling, SOB or lightheadedness with hypotension: Yes Has patient had a PCN reaction causing severe rash involving mucus membranes or skin necrosis: No Has patient had a PCN reaction that required hospitalization: Yes Has patient had a PCN reaction occurring within the last 10 years: No If all of the above answers are "NO", then may proceed with Cephalosporin use.    Azithromycin Itching, Swelling and Rash   Levaquin [Levofloxacin In D5w] Hives, Itching, Swelling and Rash   Clindamycin/Lincomycin Hives   Erythromycin Base Itching   Keflex [Cephalexin] Itching and Rash   Sulfa Antibiotics Itching and Rash   Tetracyclines & Related Itching and Rash    SOCIAL HISTORY/FAMILY HISTORY   Reviewed in Epic:  Pertinent findings:  Social History   Tobacco Use   Smoking status: Never   Smokeless tobacco: Never  Vaping Use   Vaping Use: Never used   Substance Use Topics   Alcohol use: No   Drug use: No   Social History   Social History Narrative   Relocated from [place]   She is currently "'.  She lives with her son (who is autistic).  She herself is disabled, and not working.   She never smoked and never drank alcohol.   She enjoys walking 7 days a week.    OBJCTIVE -PE, EKG, labs   Wt Readings from Last 3 Encounters:  05/31/21 207 lb 3.2 oz (94 kg)  05/26/21 205 lb 9.6 oz (93.3 kg)  04/05/21 202 lb 3.2 oz (91.7 kg)    Physical Exam: BP 130/65 (BP Location: Left Arm)   Pulse 74   Ht 5\' 5"  (1.651 m)   Wt 207 lb 3.2 oz (94 kg)   SpO2 97%   BMI 34.48 kg/m  Physical Exam Vitals reviewed.  Constitutional:      General: She is not in acute distress.    Appearance: Normal appearance. She is not ill-appearing or toxic-appearing.     Comments: Well-nourished, healthy-appearing.  HENT:     Head: Normocephalic and atraumatic.  Neck:     Vascular: No JVD.  Cardiovascular:     Rate and Rhythm: Normal rate and regular rhythm. Occasional Extrasystoles are present.    Chest Wall: PMI is not displaced.     Pulses: Normal pulses.     Heart sounds: S1 normal and S2 normal. Heart sounds are distant. Murmur (1/6 SEM at RUSB) heard.    No friction rub. No gallop.  Pulmonary:     Effort: Pulmonary effort is normal. No respiratory distress.     Breath sounds: Normal breath sounds.  Chest:     Chest wall: Tenderness present.  Musculoskeletal:        General: No swelling. Normal range of motion.     Cervical back: Normal range of motion and neck supple.  Lymphadenopathy:     Cervical: No cervical  adenopathy.  Neurological:     General: No focal deficit present.     Mental Status: She is alert and oriented to person, place, and time.     Gait: Gait normal.     Comments: Poor historian.  Suspect memory loss.  Psychiatric:        Mood and Affect: Mood normal.     Comments: Normal mood and affect, just does not really answer  questions consistently.  Giggles sometimes and answers "sometimes" more frequently than anything else.      Adult ECG Report Not checked  Recent Labs: Reviewed Lab Results  Component Value Date   CHOL 157 04/05/2021   HDL 73 04/05/2021   LDLCALC 66 04/05/2021   TRIG 101 04/05/2021   CHOLHDL 2.2 04/05/2021   Lab Results  Component Value Date   CREATININE 1.11 (H) 04/05/2021   BUN 14 04/05/2021   NA 139 04/05/2021   K 4.3 04/05/2021   CL 97 04/05/2021   CO2 25 04/05/2021   CBC Latest Ref Rng & Units 04/05/2021 03/31/2020 02/12/2020  WBC 3.4 - 10.8 x10E3/uL 9.9 8.7 9.2  Hemoglobin 11.1 - 15.9 g/dL 11.3 11.1 10.6(L)  Hematocrit 34.0 - 46.6 % 35.3 35.9 35.0(L)  Platelets 150 - 450 x10E3/uL 342 385 376    Lab Results  Component Value Date   HGBA1C 5.5 04/05/2021   Lab Results  Component Value Date   TSH 1.256 02/18/2015    ==================================================  COVID-19 Education: The signs and symptoms of COVID-19 were discussed with the patient and how to seek care for testing (follow up with PCP or arrange E-visit).    I spent a total of 16 minutes with the patient spent in direct patient consultation.  Additional time spent with chart review  / charting (studies, outside notes, etc): 15 min Total Time: 31 min  Current medicines are reviewed at length with the patient today.  (+/- concerns) none  This visit occurred during the SARS-CoV-2 public health emergency.  Safety protocols were in place, including screening questions prior to the visit, additional usage of staff PPE, and extensive cleaning of exam room while observing appropriate contact time as indicated for disinfecting solutions.  Notice: This dictation was prepared with Dragon dictation along with smart phrase technology. Any transcriptional errors that result from this process are unintentional and may not be corrected upon review.  Patient Instructions / Medication Changes & Studies & Tests  Ordered   Patient Instructions  Medication Instructions:  No changes  *If you need a refill on your cardiac medications before your next appointment, please call your pharmacy*   Lab Work: Not needed    Testing/Procedures: Not needed   Follow-Up: At [facility], you and your health needs are our priority.  As part of our continuing mission to provide you with exceptional heart care, we have created designated Provider Care Teams.  These Care Teams include your primary Cardiologist (physician) and Advanced Practice Providers (APPs -  Physician Assistants and Nurse Practitioners) who all work together to provide you with the care you need, when you need it.  We recommend signing up for the patient portal called "MyChart".  Sign up information is provided on this After Visit Summary.  MyChart is used to connect with patients for Virtual Visits (Telemedicine).  Patients are able to view lab/test results, encounter notes, upcoming appointments, etc.  Non-urgent messages can be sent to your provider as well.   To learn more about what you can do with MyChart,  go to [email or web address].    Your next appointment:   12 month(s)  The format for your next appointment:   In Person  Provider:   Glenetta Hew, MD        Studies Ordered:   No orders of the defined types were placed in this encounter.    Glenetta Hew, M.D., M.S. Interventional Cardiologist   Pager # [phone] Phone # [phone] [address] [place], [place] [address]   Thank you for choosing Heartcare at [facility]!!

## 2021-05-31 NOTE — Patient Instructions (Signed)

## 2021-06-01 NOTE — Telephone Encounter (Addendum)
   Name: Peggy [name]  DOB: November 01, 1945  MRN: [id number]   Primary Cardiologist: Glenetta Hew, MD  Chart reviewed as part of pre-operative protocol coverage.   Patient was seen outpatient by Dr. Ellyn Hack 05/31/21. Will route to Dr. Ellyn Hack to address preop status in his note and forward to the requesting surgeon.   I will remove from pre-op pool at this time.   Abigail Butts, PA-C 06/01/2021, 10:33 AM

## 2021-06-05 ENCOUNTER — Encounter: Payer: Self-pay | Admitting: Cardiology

## 2021-06-05 ENCOUNTER — Other Ambulatory Visit: Payer: Self-pay | Admitting: Nurse Practitioner

## 2021-06-05 DIAGNOSIS — Z01810 Encounter for preprocedural cardiovascular examination: Secondary | ICD-10-CM | POA: Insufficient documentation

## 2021-06-05 NOTE — Assessment & Plan Note (Signed)
She has short little bursts that are for the most part pretty well controlled on Toprol.  I am leery of increasing too much because the spells are not lasting very long.  If she is more symptomatic, would probably increase to 50 mg.

## 2021-06-05 NOTE — Assessment & Plan Note (Signed)
She has had a heart catheterizations in 2005 and 2012 followed by coronary CTA showing moderate disease.  We just evaluated with a Myoview stress test in April that was nonischemic.  I do not think that her strange episodes of chest pain are true anginal symptoms.  She is on amlodipine for the possibility of spasm although it does not sound like spasm.   For preventive maintenance, she is on beta-blocker statin and Zetia  Should be on aspirin

## 2021-06-05 NOTE — Assessment & Plan Note (Signed)
Pending OMFS surgery.  Should be a relatively low risk surgery for a low risk patient.  She is not very active, just had a Myoview stress test that was nonischemic confirming known nonobstructive CAD.  No further ischemic evaluation warranted.  She does not have aspirin listed, although she should be on it.  As such, there is no medication to hold for surgery.  Okay to proceed with surgery without any further evaluation.Marland Kitchen

## 2021-06-05 NOTE — Assessment & Plan Note (Signed)
Stable blood pressure on amlodipine low-dose along with Toprol and Lasix.  We added amlodipine for possible coronary spasm symptoms, and beta-blockers are being used for palpitations.

## 2021-06-05 NOTE — Assessment & Plan Note (Signed)
LDL 66, well-controlled on current dose simvastatin.  Tolerating well.  Labs checked by PCP.

## 2021-06-14 DIAGNOSIS — F331 Major depressive disorder, recurrent, moderate: Secondary | ICD-10-CM | POA: Diagnosis not present

## 2021-06-14 DIAGNOSIS — F418 Other specified anxiety disorders: Secondary | ICD-10-CM | POA: Diagnosis not present

## 2021-06-14 NOTE — Telephone Encounter (Signed)
Patient was recently cleared by Dr. Ellyn Hack during the office visit.  I have resent Dr. Allison Quarry note to the requesting provider.  Please call and verify they have received it.

## 2021-06-14 NOTE — Telephone Encounter (Signed)
Dr. Hoyt Koch office called wanting to know status of clearance.

## 2021-06-14 NOTE — Telephone Encounter (Signed)
Clearance has been re-faxed to Dr. Diona Browner today

## 2021-06-28 ENCOUNTER — Other Ambulatory Visit: Payer: Self-pay

## 2021-06-28 ENCOUNTER — Ambulatory Visit (INDEPENDENT_AMBULATORY_CARE_PROVIDER_SITE_OTHER): Payer: Medicare HMO

## 2021-06-28 VITALS — BP 130/78 | HR 59 | Temp 98.1°F | Ht 65.0 in

## 2021-06-28 DIAGNOSIS — Z23 Encounter for immunization: Secondary | ICD-10-CM

## 2021-06-28 NOTE — Progress Notes (Signed)
Pt here for a flu shot

## 2021-06-28 NOTE — Patient Instructions (Signed)
Influenza, Adult °Influenza, also called "the flu," is a viral infection that mainly affects the respiratory tract. This includes the lungs, nose, and throat. The flu spreads easily from person to person (is contagious). It causes common cold symptoms, along with high fever and body aches. °What are the causes? °This condition is caused by the influenza virus. You can get the virus by: °Breathing in droplets that are in the air from an infected person's cough or sneeze. °Touching something that has the virus on it (has been contaminated) and then touching your mouth, nose, or eyes. °What increases the risk? °The following factors may make you more likely to get the flu: °Not washing or sanitizing your hands often. °Having close contact with many people during cold and flu season. °Touching your mouth, eyes, or nose without first washing or sanitizing your hands. °Not getting an annual flu shot. °You may have a higher risk for the flu, including serious problems, such as a lung infection (pneumonia), if you: °Are older than 65. °Are pregnant. °Have a weakened disease-fighting system (immune system). This includes people who have HIV or AIDS, are on chemotherapy, or are taking medicines that reduce (suppress) the immune system. °Have a long-term (chronic) illness, such as heart disease, kidney disease, diabetes, or lung disease. °Have a liver disorder. °Are severely overweight (morbidly obese). °Have anemia. °Have asthma. °What are the signs or symptoms? °Symptoms of this condition usually begin suddenly and last 4-14 days. These may include: °Fever and chills. °Headaches, body aches, or muscle aches. °Sore throat. °Cough. °Runny or stuffy (congested) nose. °Chest discomfort. °Poor appetite. °Weakness or fatigue. °Dizziness. °Nausea or vomiting. °How is this diagnosed? °This condition may be diagnosed based on: °Your symptoms and medical history. °A physical exam. °Swabbing your nose or throat and testing the fluid  for the influenza virus. °How is this treated? °If the flu is diagnosed early, you can be treated with antiviral medicine that is given by mouth (orally) or through an IV. This can help reduce how severe the illness is and how long it lasts. °Taking care of yourself at home can help relieve symptoms. Your health care provider may recommend: °Taking over-the-counter medicines. °Drinking plenty of fluids. °In many cases, the flu goes away on its own. If you have severe symptoms or complications, you may be treated in a hospital. °Follow these instructions at home: °Activity °Rest as needed and get plenty of sleep. °Stay home from work or school as told by your health care provider. Unless you are visiting your health care provider, avoid leaving home until your fever has been gone for 24 hours without taking medicine. °Eating and drinking °Take an oral rehydration solution (ORS). This is a drink that is sold at pharmacies and retail stores. °Drink enough fluid to keep your urine pale yellow. °Drink clear fluids in small amounts as you are able. Clear fluids include water, ice chips, fruit juice mixed with water, and low-calorie sports drinks. °Eat bland, easy-to-digest foods in small amounts as you are able. These foods include bananas, applesauce, rice, lean meats, toast, and crackers. °Avoid drinking fluids that contain a lot of sugar or caffeine, such as energy drinks, regular sports drinks, and soda. °Avoid alcohol. °Avoid spicy or fatty foods. °General instructions °  °Take over-the-counter and prescription medicines only as told by your health care provider. °Use a cool mist humidifier to add humidity to the air in your home. This can make it easier to breathe. °When using a cool mist humidifier,   clean it daily. Empty the water and replace it with clean water. °Cover your mouth and nose when you cough or sneeze. °Wash your hands with soap and water often and for at least 20 seconds, especially after you cough or  sneeze. If soap and water are not available, use alcohol-based hand sanitizer. °Keep all follow-up visits. This is important. °How is this prevented? ° °Get an annual flu shot. This is usually available in late summer, fall, or winter. Ask your health care provider when you should get your flu shot. °Avoid contact with people who are sick during cold and flu season. This is generally fall and winter. °Contact a health care provider if: °You develop new symptoms. °You have: °Chest pain. °Diarrhea. °A fever. °Your cough gets worse. °You produce more mucus. °You feel nauseous or you vomit. °Get help right away if you: °Develop shortness of breath or have difficulty breathing. °Have skin or nails that turn a bluish color. °Have severe pain or stiffness in your neck. °Develop a sudden headache or sudden pain in your face or ear. °Cannot eat or drink without vomiting. °These symptoms may represent a serious problem that is an emergency. Do not wait to see if the symptoms will go away. Get medical help right away. Call your local emergency services (911 in the [place] Do not drive yourself to the hospital. °Summary °Influenza, also called "the flu," is a viral infection that primarily affects your respiratory tract. °Symptoms of the flu usually begin suddenly and last 4-14 days. °Getting an annual flu shot is the best way to prevent getting the flu. °Stay home from work or school as told by your health care provider. Unless you are visiting your health care provider, avoid leaving home until your fever has been gone for 24 hours without taking medicine. °Keep all follow-up visits. This is important. °This information is not intended to replace advice given to you by your health care provider. Make sure you discuss any questions you have with your health care provider. °Document Revised: 04/09/2020 Document Reviewed: 04/09/2020 °Elsevier Patient Education © 2022 [employer] ° °

## 2021-08-04 ENCOUNTER — Other Ambulatory Visit: Payer: Self-pay | Admitting: Nurse Practitioner

## 2021-08-09 ENCOUNTER — Encounter: Payer: Self-pay | Admitting: Nurse Practitioner

## 2021-08-09 ENCOUNTER — Ambulatory Visit (INDEPENDENT_AMBULATORY_CARE_PROVIDER_SITE_OTHER): Payer: Medicare HMO | Admitting: Nurse Practitioner

## 2021-08-09 ENCOUNTER — Other Ambulatory Visit: Payer: Self-pay

## 2021-08-09 VITALS — BP 122/78 | HR 78 | Temp 98.6°F | Ht 65.0 in | Wt 205.0 lb

## 2021-08-09 DIAGNOSIS — E782 Mixed hyperlipidemia: Secondary | ICD-10-CM

## 2021-08-09 DIAGNOSIS — I700 Atherosclerosis of aorta: Secondary | ICD-10-CM | POA: Diagnosis not present

## 2021-08-09 DIAGNOSIS — Z23 Encounter for immunization: Secondary | ICD-10-CM

## 2021-08-09 DIAGNOSIS — I10 Essential (primary) hypertension: Secondary | ICD-10-CM | POA: Diagnosis not present

## 2021-08-09 NOTE — Progress Notes (Signed)
I,Yamilka J Llittleton,acting as a Education administrator for [employer], FNP.,have documented all relevant documentation on the behalf of [name], FNP,as directed by  [name], FNP while in the presence of [name], [place].   This visit occurred during the SARS-CoV-2 public health emergency.  Safety protocols were in place, including screening questions prior to the visit, additional usage of staff PPE, and extensive cleaning of exam room while observing appropriate contact time as indicated for disinfecting solutions.  Subjective:     Patient ID: Peggy [name] , female    DOB: September 09, 1945 , 75 y.o.   MRN: [id number]   Chief Complaint  Patient presents with   Hypertension   Hyperlipidemia    HPI  Patient presents today for a bp and chol check. She has seen Cardiology - Dr. Ellyn Hack and her psychiatrist no changes.   Wt Readings from Last 3 Encounters: 08/09/21 : 205 lb (93 kg) 05/31/21 : 207 lb 3.2 oz (94 kg) 05/26/21 : 205 lb 9.6 oz (93.3 kg)    Hypertension This is a chronic problem. The problem is controlled. Pertinent negatives include no anxiety or chest pain. There are no associated agents to hypertension. Risk factors for coronary artery disease include sedentary lifestyle and obesity. Past treatments include diuretics. There are no compliance problems.  There is no history of angina. There is no history of chronic renal disease.  Hyperlipidemia This is a chronic problem. The problem is controlled. Recent lipid tests were reviewed and are normal. She has no history of chronic renal disease. There are no known factors aggravating her hyperlipidemia. Pertinent negatives include no chest pain. Risk factors for coronary artery disease include obesity and a sedentary lifestyle.    Past Medical History:  Diagnosis Date   Anxiety    Arthritis    "all over"   Asthma    Bladder cancer ([place])    Chest pain at rest 02/18/2015   Chronic bronchitis [facility])    'get it q yr"   Chronic lower  back pain    Coronary artery disease, non-occlusive 2005   Cardiac catheter 2005 and 2012 for "anginal symptoms "showed angiographically normal coronary arteries. -->  Negative Myoview June 2016.  Coronary calcification noted on chest CT -> calcium score 131, moderate proximal LAD and LCx disease on coronary CTA - (2019)   Daily headache    Deafness in left ear    Depression    DVT (deep venous thrombosis) ([place]) 2009/2010   "probably left leg"    GERD (gastroesophageal reflux disease)    History of kidney stones    Hyperlipidemia with target LDL less than 100    Hypertension    Leg swelling 12/12/2017   Nummular eczema    Sciatica 12/10/2012   Skin cancer    "right abdomen"     Family History  Problem Relation Age of Onset   Heart failure Mother    Cancer Mother    Hypertension Mother    Hypertension Father    Heart attack Father        Noted on cardiology intake form as "massive heart attack" at age 59   Heart failure Sister    Heart attack Sister        Not listed on her cardiology intake form   Breast cancer Sister    Heart failure Sister    Heart attack Sister        Not listed on her cardiology intake   Cancer Sister    Breast  cancer Sister      Current Outpatient Medications:    acetaminophen (TYLENOL) 500 MG tablet, Take 1,000 mg by mouth 2 (two) times daily as needed for moderate pain or headache. , Disp: , Rfl:    amLODipine (NORVASC) 2.5 MG tablet, Take 1 tablet (2.5 mg total) by mouth every evening., Disp: 90 tablet, Rfl: 3   diphenhydrAMINE (BENADRYL) 25 mg capsule, Take 25 mg by mouth daily as needed for allergies., Disp: , Rfl:    ezetimibe (ZETIA) 10 MG tablet, Take 1 tablet (10 mg total) by mouth daily., Disp: 90 tablet, Rfl: 3   FLUoxetine (PROZAC) 40 MG capsule, Take 40 mg by mouth daily as needed (depression)., Disp: , Rfl:    furosemide (LASIX) 20 MG tablet, Take 1 tablet (20 mg total) by mouth daily. And may take a n additional 20 mg as needed for  shortness of breathe or edema( swelling), Disp: 100 tablet, Rfl: 3   metoprolol succinate (TOPROL-XL) 25 MG 24 hr tablet, TAKE 1 TABLET(25 MG) BY MOUTH DAILY, Disp: 90 tablet, Rfl: 1   nystatin powder, APPLY TOPICALLY 4 TIMES A DAY, Disp: 30 g, Rfl: 1   oxyCODONE-acetaminophen (PERCOCET) 10-325 MG tablet, Take 1 tablet by mouth every 6 (six) hours as needed for pain., Disp: 20 tablet, Rfl: 0   simvastatin (ZOCOR) 80 MG tablet, TAKE 1 TABLET(80 MG) BY MOUTH AT BEDTIME, Disp: 90 tablet, Rfl: 1   trandolapril (MAVIK) 2 MG tablet, Take 1 tablet (2 mg total) by mouth daily., Disp: 90 tablet, Rfl: 1   valACYclovir (VALTREX) 1000 MG tablet, TAKE 1/2 TABLET BY MOUTH THREE TIMES DAILY AS NEEDED FOR OUTBREAK, Disp: 135 tablet, Rfl: 1   Allergies  Allergen Reactions   Penicillins Shortness Of Breath, Itching, Swelling, Rash and Other (See Comments)    Has patient had a PCN reaction causing immediate rash, facial/tongue/throat swelling, SOB or lightheadedness with hypotension: Yes Has patient had a PCN reaction causing severe rash involving mucus membranes or skin necrosis: No Has patient had a PCN reaction that required hospitalization: Yes Has patient had a PCN reaction occurring within the last 10 years: No If all of the above answers are "NO", then may proceed with Cephalosporin use.    Azithromycin Itching, Swelling and Rash   Levaquin [Levofloxacin In D5w] Hives, Itching, Swelling and Rash   Clindamycin/Lincomycin Hives   Erythromycin Base Itching   Keflex [Cephalexin] Itching and Rash   Sulfa Antibiotics Itching and Rash   Tetracyclines & Related Itching and Rash     Review of Systems  Constitutional: Negative.   Respiratory: Negative.    Cardiovascular: Negative.  Negative for chest pain.  Neurological: Negative.   Psychiatric/Behavioral: Negative.      Today's Vitals   08/09/21 0958  BP: 122/78  Pulse: 78  Temp: 98.6 F (37 C)  Weight: 205 lb (93 kg)  Height: $Remove'5\' 5"'cCpqaYv$  (1.651 m)   PainSc: 10-Worst pain ever  PainLoc: Knee   Body mass index is 34.11 kg/m.   Objective:  Physical Exam Vitals reviewed.  Constitutional:      General: She is not in acute distress.    Appearance: Normal appearance. She is obese.  Cardiovascular:     Rate and Rhythm: Normal rate and regular rhythm.     Pulses: Normal pulses.     Heart sounds: Normal heart sounds. No murmur heard. Pulmonary:     Effort: Pulmonary effort is normal. No respiratory distress.     Breath sounds: Normal breath sounds.  No wheezing.  Musculoskeletal:     Right lower leg: Edema (trace) present.     Left lower leg: Edema (trace) present.  Skin:    General: Skin is warm and dry.     Capillary Refill: Capillary refill takes less than 2 seconds.  Neurological:     General: No focal deficit present.     Mental Status: She is alert and oriented to person, place, and time.     Cranial Nerves: No cranial nerve deficit.     Motor: No weakness.  Psychiatric:        Mood and Affect: Mood normal.        Behavior: Behavior normal.        Thought Content: Thought content normal.        Judgment: Judgment normal.        Assessment And Plan:     1. Essential hypertension Comments: Blood pressure is well controlled, continue follow up with Dr. Ellyn Hack - [id number]+EGFR  2. Mixed hyperlipidemia Comments: Stable, continue statin tolerating well.  - Lipid panel  3. Atherosclerosis of aorta (HCC) Comments: Continue statin, tolerating well  4. Encounter for immunization - Pneumococcal polysaccharide vaccine 23-valent greater than or equal to 2yo subcutaneous/IM     Patient was given opportunity to ask questions. Patient verbalized understanding of the plan and was able to repeat key elements of the plan. All questions were answered to their satisfaction.  [name], FNP   I, [name], FNP, have reviewed all documentation for this visit. The documentation on 08/09/21 for the exam, diagnosis, procedures,  and orders are all accurate and complete.   IF YOU HAVE BEEN REFERRED TO A SPECIALIST, IT MAY TAKE 1-2 WEEKS TO SCHEDULE/PROCESS THE REFERRAL. IF YOU HAVE NOT HEARD FROM US/SPECIALIST IN TWO WEEKS, PLEASE GIVE [place] A CALL AT [phone] X [phone].   THE PATIENT IS ENCOURAGED TO PRACTICE SOCIAL DISTANCING DUE TO THE COVID-19 PANDEMIC.

## 2021-08-09 NOTE — Patient Instructions (Signed)

## 2021-08-10 LAB — LIPID PANEL
Chol/HDL Ratio: 2.1 ratio (ref 0.0–4.4)
Cholesterol, Total: 139 mg/dL (ref 100–199)
HDL: 67 mg/dL (ref >39)
LDL Chol Calc (NIH): 56 mg/dL (ref 0–99)
Triglycerides: 83 mg/dL (ref 0–149)
VLDL Cholesterol Cal: 16 mg/dL (ref 5–40)

## 2021-08-10 LAB — CMP14+EGFR
ALT: 12 IU/L (ref 0–32)
AST: 21 IU/L (ref 0–40)
Albumin/Globulin Ratio: 1.6 (ref 1.2–2.2)
Albumin: 4.4 g/dL (ref 3.7–4.7)
Alkaline Phosphatase: 142 IU/L — ABNORMAL HIGH (ref 44–121)
BUN/Creatinine Ratio: 7 — ABNORMAL LOW (ref 12–28)
BUN: 8 mg/dL (ref 8–27)
Bilirubin Total: 0.6 mg/dL (ref 0.0–1.2)
CO2: 27 mmol/L (ref 20–29)
Calcium: 9.3 mg/dL (ref 8.7–10.3)
Chloride: 105 mmol/L (ref 96–106)
Creatinine, Ser: 1.12 mg/dL — ABNORMAL HIGH (ref 0.57–1.00)
Globulin, Total: 2.7 g/dL (ref 1.5–4.5)
Glucose: 86 mg/dL (ref 70–99)
Potassium: 4.4 mmol/L (ref 3.5–5.2)
Sodium: 145 mmol/L — ABNORMAL HIGH (ref 134–144)
Total Protein: 7.1 g/dL (ref 6.0–8.5)
eGFR: 51 mL/min/{1.73_m2} — ABNORMAL LOW (ref >59)

## 2021-08-15 ENCOUNTER — Other Ambulatory Visit: Payer: Self-pay | Admitting: Nurse Practitioner

## 2021-08-15 DIAGNOSIS — I10 Essential (primary) hypertension: Secondary | ICD-10-CM

## 2021-08-16 ENCOUNTER — Other Ambulatory Visit: Payer: Self-pay

## 2021-08-16 DIAGNOSIS — I10 Essential (primary) hypertension: Secondary | ICD-10-CM

## 2021-08-16 MED ORDER — TRANDOLAPRIL 2 MG PO TABS: 2.0000 mg | ORAL_TABLET | Freq: Every day | ORAL | 1 refills | Status: DC

## 2021-08-18 ENCOUNTER — Ambulatory Visit: Payer: Medicare HMO

## 2021-08-18 ENCOUNTER — Ambulatory Visit: Payer: Medicare HMO | Admitting: Nurse Practitioner

## 2021-09-03 ENCOUNTER — Other Ambulatory Visit: Payer: Self-pay | Admitting: Cardiology

## 2021-09-13 DIAGNOSIS — F331 Major depressive disorder, recurrent, moderate: Secondary | ICD-10-CM | POA: Diagnosis not present

## 2021-09-13 DIAGNOSIS — F419 Anxiety disorder, unspecified: Secondary | ICD-10-CM | POA: Diagnosis not present

## 2021-10-25 DIAGNOSIS — R69 Illness, unspecified: Secondary | ICD-10-CM | POA: Diagnosis not present

## 2021-11-02 ENCOUNTER — Other Ambulatory Visit: Payer: Self-pay | Admitting: Cardiology

## 2021-12-02 ENCOUNTER — Other Ambulatory Visit: Payer: Self-pay | Admitting: Nurse Practitioner

## 2021-12-12 ENCOUNTER — Ambulatory Visit: Payer: Medicare HMO | Admitting: Nurse Practitioner

## 2021-12-13 DIAGNOSIS — F418 Other specified anxiety disorders: Secondary | ICD-10-CM | POA: Diagnosis not present

## 2021-12-13 DIAGNOSIS — F331 Major depressive disorder, recurrent, moderate: Secondary | ICD-10-CM | POA: Diagnosis not present

## 2022-01-04 DIAGNOSIS — H43392 Other vitreous opacities, left eye: Secondary | ICD-10-CM | POA: Diagnosis not present

## 2022-01-04 DIAGNOSIS — H43812 Vitreous degeneration, left eye: Secondary | ICD-10-CM | POA: Diagnosis not present

## 2022-01-04 DIAGNOSIS — H524 Presbyopia: Secondary | ICD-10-CM | POA: Diagnosis not present

## 2022-01-04 DIAGNOSIS — H25813 Combined forms of age-related cataract, bilateral: Secondary | ICD-10-CM | POA: Diagnosis not present

## 2022-01-04 DIAGNOSIS — Z0100 Encounter for examination of eyes and vision without abnormal findings: Secondary | ICD-10-CM | POA: Diagnosis not present

## 2022-01-04 DIAGNOSIS — H40013 Open angle with borderline findings, low risk, bilateral: Secondary | ICD-10-CM | POA: Diagnosis not present

## 2022-01-04 DIAGNOSIS — H10413 Chronic giant papillary conjunctivitis, bilateral: Secondary | ICD-10-CM | POA: Diagnosis not present

## 2022-01-26 ENCOUNTER — Other Ambulatory Visit: Payer: Self-pay

## 2022-01-26 MED ORDER — SIMVASTATIN 80 MG PO TABS: ORAL_TABLET | ORAL | 1 refills | Status: DC

## 2022-02-06 ENCOUNTER — Other Ambulatory Visit: Payer: Self-pay | Admitting: Cardiology

## 2022-02-15 ENCOUNTER — Other Ambulatory Visit: Payer: Self-pay

## 2022-02-15 DIAGNOSIS — I10 Essential (primary) hypertension: Secondary | ICD-10-CM

## 2022-02-15 MED ORDER — TRANDOLAPRIL 2 MG PO TABS: 2.0000 mg | ORAL_TABLET | Freq: Every day | ORAL | 1 refills | Status: DC

## 2022-04-04 DIAGNOSIS — F419 Anxiety disorder, unspecified: Secondary | ICD-10-CM | POA: Diagnosis not present

## 2022-04-04 DIAGNOSIS — F331 Major depressive disorder, recurrent, moderate: Secondary | ICD-10-CM | POA: Diagnosis not present

## 2022-04-06 ENCOUNTER — Telehealth: Payer: Self-pay | Admitting: Cardiology

## 2022-04-06 NOTE — Progress Notes (Signed)
Cardiology Clinic Note   Patient Name: Peggy [name] Date of Encounter: 04/07/2022  Primary Care Provider:  Minette [name], Peggy [name] Primary Cardiologist:  Peggy Hew, MD  Patient Profile    76 year old female with known history of coronary artery disease (nonocclusive with left heart catheterization in 2005 in 2012 followed by coronary CTA which revealed moderate disease, and Myoview stress test in April 2022 which was nonischemic, it was felt that it was possible that she was having coronary artery spasms.  She also has a history of short bursts of PSVT remained on low-dose metoprolol 25 mg daily with indications to increase to 50 mg daily if she became symptomatic.  She was placed on beta-blocker,her labs with an LDL of 66 and no medication changes were made.   Past Medical History    Past Medical History:  Diagnosis Date   Anxiety    Arthritis    "all over"   Asthma    Bladder cancer ([place])    Chest pain at rest 02/18/2015   Chronic bronchitis [facility])    'get it q yr"   Chronic lower back pain    Coronary artery disease, non-occlusive 2005   Cardiac catheter 2005 and 2012 for "anginal symptoms "showed angiographically normal coronary arteries. -->  Negative Myoview June 2016.  Coronary calcification noted on chest CT -> calcium score 131, moderate proximal LAD and LCx disease on coronary CTA - (2019)   Daily headache    Deafness in left ear    Depression    DVT (deep venous thrombosis) ([place]) 2009/2010   "probably left leg"    GERD (gastroesophageal reflux disease)    History of kidney stones    Hyperlipidemia with target LDL less than 100    Hypertension    Leg swelling 12/12/2017   Nummular eczema    Sciatica 12/10/2012   Skin cancer    "right abdomen"   Past Surgical History:  Procedure Laterality Date   ABDOMINAL HYSTERECTOMY  1980's   APPENDECTOMY Right    BACK SURGERY     x 2, lower   BLADDER TUMOR EXCISION     BREAST BIOPSY Right    BREAST EXCISIONAL BIOPSY Right     CAROTID ARTERY DOPPLERS   10/2017   Carotid Dopplers: 40-59% stenosis on the Right Internal Carotid, < 40% stenosis in the Left.  Both vertebral arteries show normal flow.  Left subclavian artery is normal, but there is some flow disturbance in the right subclavian artery.   COLONOSCOPY WITH PROPOFOL N/A 02/26/2015   Procedure: COLONOSCOPY WITH PROPOFOL;  Surgeon: Carol Ada, MD;  Location: [facility];  Service: Endoscopy;  Laterality: N/A;   COLONOSCOPY WITH PROPOFOL N/A 12/06/2017   Procedure: COLONOSCOPY WITH PROPOFOL;  Surgeon: Juanita Craver, MD;  Location: [facility];  Service: Endoscopy;  Laterality: N/A;   CORONARY CALCIUM SCORE & CT ANGIOGRAM  11/2017   Coronary calcium score 131 (moderate risk).  Moderate proximal LAD & LCx stenosis --> LOW RISK STUDY.  No evidence to suspect cardiac related chest pain --> correlates with cardiac catheterizations   ENTEROSCOPY N/A 12/14/2017   Procedure: ENTEROSCOPY;  Surgeon: Carol Ada, MD;  Location: [facility];  Service: Endoscopy;  Laterality: N/A;   ESOPHAGOGASTRODUODENOSCOPY Left 02/20/2015   Procedure: ESOPHAGOGASTRODUODENOSCOPY (EGD);  Surgeon: Milus Banister, MD;  Location: [place];  Service: Endoscopy;  Laterality: Left;   ESOPHAGOGASTRODUODENOSCOPY (EGD) WITH PROPOFOL N/A 12/06/2017   Procedure: ESOPHAGOGASTRODUODENOSCOPY (EGD) WITH PROPOFOL;  Surgeon: Juanita Craver, MD;  Location: [facility];  Service: Endoscopy;  Laterality: N/A;   HOT HEMOSTASIS N/A 12/14/2017   Procedure: HOT HEMOSTASIS (ARGON PLASMA COAGULATION/BICAP);  Surgeon: Carol Ada, MD;  Location: [facility];  Service: Endoscopy;  Laterality: N/A;   JOINT REPLACEMENT     KNEE ARTHROSCOPY Right ~ 2000   LEFT HEART CATH AND CORONARY ANGIOGRAPHY  11/'05; 2/'12   Angiographically normal coronary arteries   LUMBAR DISC SURGERY  1980's X 1; ~ 2002   MEDIAL PARTIAL KNEE REPLACEMENT Right 2001   NM MYOVIEW LTD  02/2015   EF 65%.  No reversible ischemia.  Possible  scar along the cardiac apex and anteroseptal wall.  (Mild severity, medium-sized defect in the apex anteroseptal wall noted on both resting and stress images).  No inducible ischemia.  Read as LOW RISK   NM MYOVIEW LTD  12/09/2020   LOW RISK.  EF 55 to 65%.  No ischemia or infarct.   PARTIAL KNEE ARTHROPLASTY Left 2009   REPLACEMENT TOTAL KNEE BILATERAL Bilateral    REVISION TOTAL KNEE ARTHROPLASTY Left    SKIN CANCER EXCISION Right    "abdomen"   TRANSTHORACIC ECHOCARDIOGRAM  06/'16; 2/'19; 1/'21    A) mod Conc LVH. EF 60-65%.  No RWMA. GR 1 D. Mild Ao Stenosis. Mild LA dilation.;  B) EF 60-65%.  Mild LVH.  No or W MA.  No significant valve disease.;l 01/2020: Normal LV systolic function.  Mild LVH.  GR 1 DD.  EF 60 to 65%.  No R WMA.  Trace AI.  No AS    Allergies  Allergies  Allergen Reactions   Penicillins Shortness Of Breath, Itching, Swelling, Rash and Other (See Comments)    Has patient had a PCN reaction causing immediate rash, facial/tongue/throat swelling, SOB or lightheadedness with hypotension: Yes Has patient had a PCN reaction causing severe rash involving mucus membranes or skin necrosis: No Has patient had a PCN reaction that required hospitalization: Yes Has patient had a PCN reaction occurring within the last 10 years: No If all of the above answers are "NO", then may proceed with Cephalosporin use.    Azithromycin Itching, Swelling and Rash   Levaquin [Levofloxacin In D5w] Hives, Itching, Swelling and Rash   Clindamycin/Lincomycin Hives   Erythromycin Base Itching   Keflex [Cephalexin] Itching and Rash   Sulfa Antibiotics Itching and Rash   Tetracyclines & Related Itching and Rash    History of Present Illness    [name] is a 76 year old female we are following for ongoing assessment and management of hypertension, hyperlipidemia, PSVT, chronic chest pain with possibility of coronary artery spasms, now on on beta-blocker, with nonischemic stress test in April  2022, with negative coronary CTA in 2022.    She called our office on April 06, 2022 complaining she is having some shortness of breath with intermittent pain on the left shoulder with associated swelling and chest discomfort under the left breast.  She states that she has been having some lower extremity edema, shortness of breath, and left-sided chest pain which is not new for her.  She describes the chest discomfort as sharp feeling like "a fork being stuck in my chest".  She finds relief by taking extra doses of Lasix.  She is to be on Lasix 20 mg daily but has been taking 2 tablets in the morning and 2 tablets in the evening.  She states that that helps her swelling.  But the muscle pain continues.  She denies any injury to her left shoulder, left chest,  heavy lifting, pushing or pulling on the left side.  She states she does have arthritis in his has chronic pain on the left side.  She does not take any over-the-counter pain relief medications only extra doses of Lasix.  Of note she is not on any potassium replacement.    Current Outpatient Medications  Medication Sig Dispense Refill   acetaminophen (TYLENOL) 500 MG tablet Take 1,000 mg by mouth 2 (two) times daily as needed for moderate pain or headache.      amLODipine (NORVASC) 2.5 MG tablet TAKE 1 TABLET(2.5 MG) BY MOUTH EVERY EVENING 90 tablet 3   ezetimibe (ZETIA) 10 MG tablet TAKE 1 TABLET(10 MG) BY MOUTH DAILY 90 tablet 3   FLUoxetine (PROZAC) 40 MG capsule Take 40 mg by mouth daily as needed (depression).     furosemide (LASIX) 40 MG tablet Take 1 tablet (40 mg total) by mouth daily. 90 tablet 3   metoprolol succinate (TOPROL-XL) 25 MG 24 hr tablet TAKE 1 TABLET(25 MG) BY MOUTH DAILY 90 tablet 3   potassium chloride (KLOR-CON) 10 MEQ tablet Take 1 tablet (10 mEq total) by mouth daily. 90 tablet 3   simvastatin (ZOCOR) 80 MG tablet TAKE 1 TABLET(80 MG) BY MOUTH AT BEDTIME 90 tablet 1   trandolapril (MAVIK) 2 MG tablet TAKE 1 TABLET(2  MG) BY MOUTH DAILY 90 tablet 1   valACYclovir (VALTREX) 1000 MG tablet TAKE 1/2 TABLET BY MOUTH THREE TIMES DAILY AS NEEDED FOR OUTBREAK 135 tablet 2   diazepam (VALIUM) 5 MG tablet Take 5 mg by mouth every 6 (six) hours as needed. (Patient not taking: Reported on 04/07/2022)     diphenhydrAMINE (BENADRYL) 25 mg capsule Take 25 mg by mouth daily as needed for allergies. (Patient not taking: Reported on 04/07/2022)     nystatin powder APPLY TOPICALLY 4 TIMES A DAY (Patient not taking: Reported on 04/07/2022) 30 g 1   trandolapril (MAVIK) 2 MG tablet Take 1 tablet (2 mg total) by mouth daily. 90 tablet 1   No current facility-administered medications for this visit.     Family History    Family History  Problem Relation Age of Onset   Heart failure Mother    Cancer Mother    Hypertension Mother    Hypertension Father    Heart attack Father        Noted on cardiology intake form as "massive heart attack" at age 70   Heart failure Sister    Heart attack Sister        Not listed on her cardiology intake form   Breast cancer Sister    Heart failure Sister    Heart attack Sister        Not listed on her cardiology intake   Cancer Sister    Breast cancer Sister    She indicated that her mother is deceased. She indicated that her father is deceased. She indicated that two of her eight sisters are alive. She indicated that all of her five brothers are deceased.  Social History    Social History   Socioeconomic History   Marital status: Single    Spouse name: Not on file   Number of children: 1   Years of education: Not on file   Highest education level: Not on file  Occupational History   Occupation: Retired    Fish farm manager: RETIRED    Comment: Oncologist   Tobacco Use   Smoking status: Never   Smokeless tobacco: Never  Vaping  Use   Vaping Use: Never used  Substance and Sexual Activity   Alcohol use: No   Drug use: No   Sexual activity: Not Currently  Other Topics Concern    Not on file  Social History Narrative   Relocated from [place]   She is currently "'.  She lives with her son (who is autistic).  She herself is disabled, and not working.   She never smoked and never drank alcohol.   She enjoys walking 7 days a week.   Social Determinants of Health   Financial Resource Strain: Low Risk  (05/26/2021)   Overall Financial Resource Strain (CARDIA)    Difficulty of Paying Living Expenses: Not hard at all  Food Insecurity: No Food Insecurity (05/26/2021)   Hunger Vital Sign    Worried About Running Out of Food in the Last Year: Never true    Ran Out of Food in the Last Year: Never true  Transportation Needs: No Transportation Needs (05/26/2021)   PRAPARE - Hydrologist (Medical): No    Lack of Transportation (Non-Medical): No  Physical Activity: Inactive (05/26/2021)   Exercise Vital Sign    Days of Exercise per Week: 0 days    Minutes of Exercise per Session: 0 min  Stress: No Stress Concern Present (05/26/2021)   [place]    Feeling of Stress : Not at all  Social Connections: Moderately Isolated (07/30/2018)   Social Connection and Isolation Panel [NHANES]    Frequency of Communication with Friends and Family: Never    Frequency of Social Gatherings with Friends and Family: Never    Attends Religious Services: More than 4 times per year    Active Member of [employer] or Organizations: No    Attends Archivist Meetings: Never    Marital Status: Never married  Intimate Partner Violence: Not At Risk (07/30/2018)   Humiliation, Afraid, Rape, and Kick questionnaire    Fear of Current or Ex-Partner: No    Emotionally Abused: No    Physically Abused: No    Sexually Abused: No     Review of Systems    General:  No chills, fever, night sweats or weight changes.  Cardiovascular:  No chest pain, dyspnea on exertion, edema, orthopnea, palpitations,  paroxysmal nocturnal dyspnea. Dermatological: No rash, lesions/masses Respiratory: No cough, dyspnea Urologic: No hematuria, dysuria Abdominal:   No nausea, vomiting, diarrhea, bright red blood per rectum, melena, or hematemesis Neurologic:  No visual changes, wkns, changes in mental status. All other systems reviewed and are otherwise negative except as noted above.     Physical Exam    VS:  BP (!) 130/52 (BP Location: Left Arm, Patient Position: Sitting, Cuff Size: Large)   Pulse 84   Ht _0  (1.651 m)   Wt 214 lb 12.8 oz (97.4 kg)   SpO2 97%   BMI 35.74 kg/m  , BMI Body mass index is 35.74 kg/m.     GEN: Well nourished, well developed, in no acute distress. HEENT: normal. Neck: Supple,   Obese, sitting in a wheelchair.no JVD, carotid bruits, or masses. Cardiac: RRR, no murmurs, rubs, or gallops. No clubbing, cyanosis, no edema.  Radials/DP/PT 2+ and equal bilaterally.  Respiratory:  Respirations regular and unlabored, clear to auscultation bilaterally. GI: Soft, nontender, nondistended, BS + x 4. MS: no deformity or atrophy.  Pain is reproducible to palpation of the left pectoral area above her breast.  Some radiation into the axilla.  No issues with range of motion to the shoulder. Skin: warm and dry, no rash. Neuro:  Strength and sensation are intact. Psych: Normal affect.  Accessory Clinical Findings    ECG personally reviewed by me today-sinus rhythm with sinus arrhythmia, heart rate of 62 bpm.   No acute changes  Lab Results  Component Value Date   WBC 9.9 04/05/2021   HGB 11.3 04/05/2021   HCT 35.3 04/05/2021   MCV 89 04/05/2021   PLT 342 04/05/2021   Lab Results  Component Value Date   CREATININE 1.12 (H) 08/09/2021   BUN 8 08/09/2021   NA 145 (H) 08/09/2021   K 4.4 08/09/2021   CL 105 08/09/2021   CO2 27 08/09/2021   Lab Results  Component Value Date   ALT 12 08/09/2021   AST 21 08/09/2021   ALKPHOS 142 (H) 08/09/2021   BILITOT 0.6 08/09/2021    Lab Results  Component Value Date   CHOL 139 08/09/2021   HDL 67 08/09/2021   LDLCALC 56 08/09/2021   TRIG 83 08/09/2021   CHOLHDL 2.1 08/09/2021    Lab Results  Component Value Date   HGBA1C 5.5 04/05/2021    Review of Prior Studies: NM Stress Test 12/09/2020 There was no ST segment deviation noted during stress. Nuclear stress EF: 65%. The left ventricular ejection fraction is normal (55-65%). The study is normal. This is a low risk study.  Echocardiogram 01/06/2020 1. Normal LV systolic function; mild LVH; grade 1 diastolic dysfunction;  trace AI.   2. Left ventricular ejection fraction, by estimation, is 60 to 65%. The  left ventricle has normal function. The left ventricle has no regional  wall motion abnormalities. There is mild left ventricular hypertrophy.  Left ventricular diastolic parameters  are consistent with Grade I diastolic dysfunction (impaired relaxation).   3. Right ventricular systolic function is normal. The right ventricular  size is normal.   4. The mitral valve is normal in structure. Trivial mitral valve  regurgitation. No evidence of mitral stenosis.   5. The aortic valve has an indeterminant number of cusps. Aortic valve  regurgitation is trivial. No aortic stenosis is present.   6. The inferior vena cava is normal in size with greater than 50%  respiratory variability, suggesting right atrial pressure of 3 mmHg.   Coronary CTA 11/12/2017 IMPRESSION: 1. Coronary artery calcium score 131 Agatston units. This places the patient in the 67th percentile for age and gender, suggesting intermediate risk for future cardiac events.   2.  Mild proximal LAD stenosis, mild proximal LCx stenosis.    Assessment & Plan   Left-sided chest discomfort: Likely musculoskeletal as it is reproducible with palpation, described as "feeling like a fork in my chest".  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  Has been using Lasix to assist her with  pain control.  Should only be on Lasix 20 mg daily, but has been taking up to 80 mg daily.  She is to reduce her Lasix to 40 mg daily, I am going to start her on potassium supplement of 10 mEq daily and order a BMET.  I have advised her not to take Lasix for pain control.  She is also stating that it helps her with her breathing.  She states that her breathing is bothersome with chest expansion and sometimes with walking around.  O2 sat here in the office is normal at 96%.  She is also advised to get  over-the-counter Voltaren gel.  Follow-up with PCP concerning musculoskeletal pain.  2.  Hypertension: Slightly elevated today in the office.  Continue current medication regimen of amlodipine, metoprolol, and trandolapril.  3.  Hyperlipidemia.  Continue Zocor 80 mg daily.  Uncertain if this is myalgia pain as she is only having this and a focus location in the left chest.  Checking a be met to evaluate potassium status.   Current medicines are reviewed at length with the patient today.  I have spent 25 min's  dedicated to the care of this patient on the date of this encounter to include pre-visit review of records, assessment, management and diagnostic testing,with shared decision making.  Signed, Phill Myron. West Pugh, ANP, AACC   04/07/2022 3:18 PM      Office [phone] Fax [phone]  Notice: This dictation was prepared with Dragon dictation along with smaller phrase technology. Any transcriptional errors that result from this process are unintentional and may not be corrected upon review.

## 2022-04-06 NOTE — Telephone Encounter (Signed)
Pt c/o Shortness Of Breath: STAT if SOB developed within the last 24 hours or pt is noticeably SOB on the phone  1. Are you currently SOB (can you hear that pt is SOB on the phone)? Yes   2. How long have you been experiencing SOB? Last week  3. Are you SOB when sitting or when up moving around? Both   4. Are you currently experiencing any other symptoms? Pain of left shoulder, sweating, chest pain under left breast

## 2022-04-06 NOTE — Telephone Encounter (Signed)
Spoke with pt she states that she is not having symptoms right now "just maybe a little short of breath, the other symptoms listed are intermittent" informed pt that if/when these symptoms return immediately have someone take you to the ER, verbalized understanding. Appt scheduled for 8-4 w/Kathryn Lawrence. She will call back today by the end of the day if she cannot "find a ride".

## 2022-04-07 ENCOUNTER — Encounter: Payer: Self-pay | Admitting: Adult Health

## 2022-04-07 ENCOUNTER — Ambulatory Visit (INDEPENDENT_AMBULATORY_CARE_PROVIDER_SITE_OTHER): Payer: Medicare HMO | Admitting: Adult Health

## 2022-04-07 VITALS — BP 130/52 | HR 84 | Ht 65.0 in | Wt 214.8 lb

## 2022-04-07 DIAGNOSIS — M7989 Other specified soft tissue disorders: Secondary | ICD-10-CM

## 2022-04-07 DIAGNOSIS — R0789 Other chest pain: Secondary | ICD-10-CM | POA: Diagnosis not present

## 2022-04-07 DIAGNOSIS — E7800 Pure hypercholesterolemia, unspecified: Secondary | ICD-10-CM | POA: Diagnosis not present

## 2022-04-07 DIAGNOSIS — Z79899 Other long term (current) drug therapy: Secondary | ICD-10-CM | POA: Diagnosis not present

## 2022-04-07 DIAGNOSIS — I10 Essential (primary) hypertension: Secondary | ICD-10-CM

## 2022-04-07 MED ORDER — FUROSEMIDE 40 MG PO TABS: 40.0000 mg | ORAL_TABLET | Freq: Every day | ORAL | 3 refills | Status: DC

## 2022-04-07 MED ORDER — POTASSIUM CHLORIDE ER 10 MEQ PO TBCR: 10.0000 meq | EXTENDED_RELEASE_TABLET | Freq: Every day | ORAL | 3 refills | Status: DC

## 2022-04-07 NOTE — Patient Instructions (Signed)
Medication Instructions:  Increase Lasix to 40 mg ( Take 1 Tablet Daily). Start Potassium 10 meq ( Take 1 Tablet Daily). *If you need a refill on your cardiac medications before your next appointment, please call your pharmacy*   Lab Work: BMET, Magnesium Level. Today If you have labs (blood work) drawn today and your tests are completely normal, you will receive your results only by: [place] (if you have MyChart) OR A paper copy in the mail If you have any lab test that is abnormal or we need to change your treatment, we will call you to review the results.   Testing/Procedures: No Testing   Follow-Up: At [facility], you and your health needs are our priority.  As part of our continuing mission to provide you with exceptional heart care, we have created designated Provider Care Teams.  These Care Teams include your primary Cardiologist (physician) and Advanced Practice Providers (APPs -  Physician Assistants and Nurse Practitioners) who all work together to provide you with the care you need, when you need it.  We recommend signing up for the patient portal called "MyChart".  Sign up information is provided on this After Visit Summary.  MyChart is used to connect with patients for Virtual Visits (Telemedicine).  Patients are able to view lab/test results, encounter notes, upcoming appointments, etc.  Non-urgent messages can be sent to your provider as well.   To learn more about what you can do with MyChart, go to [email or web address].    Your next appointment:   1 month(s)  The format for your next appointment:   In Person  Provider:   Jory Sims, DNP, ANP  or, Glenetta Hew, MD will plan to see you again in .{   Other Instructions Recommend Voltaren Gel for Shoulder pain.

## 2022-04-08 LAB — BASIC METABOLIC PANEL
BUN/Creatinine Ratio: 11 — ABNORMAL LOW (ref 12–28)
BUN: 11 mg/dL (ref 8–27)
CO2: 26 mmol/L (ref 20–29)
Calcium: 8.9 mg/dL (ref 8.7–10.3)
Chloride: 104 mmol/L (ref 96–106)
Creatinine, Ser: 1.02 mg/dL — ABNORMAL HIGH (ref 0.57–1.00)
Glucose: 79 mg/dL (ref 70–99)
Potassium: 4 mmol/L (ref 3.5–5.2)
Sodium: 143 mmol/L (ref 134–144)
eGFR: 56 mL/min/{1.73_m2} — ABNORMAL LOW (ref >59)

## 2022-04-08 LAB — MAGNESIUM: Magnesium: 2 mg/dL (ref 1.6–2.3)

## 2022-04-12 ENCOUNTER — Encounter: Payer: Self-pay | Admitting: Nurse Practitioner

## 2022-04-12 ENCOUNTER — Ambulatory Visit (INDEPENDENT_AMBULATORY_CARE_PROVIDER_SITE_OTHER): Payer: Medicare HMO | Admitting: Nurse Practitioner

## 2022-04-12 VITALS — BP 144/70 | HR 95 | Temp 98.7°F | Ht 65.0 in | Wt 210.0 lb

## 2022-04-12 DIAGNOSIS — E559 Vitamin D deficiency, unspecified: Secondary | ICD-10-CM

## 2022-04-12 DIAGNOSIS — Z79899 Other long term (current) drug therapy: Secondary | ICD-10-CM

## 2022-04-12 DIAGNOSIS — Z0000 Encounter for general adult medical examination without abnormal findings: Secondary | ICD-10-CM

## 2022-04-12 DIAGNOSIS — I700 Atherosclerosis of aorta: Secondary | ICD-10-CM | POA: Diagnosis not present

## 2022-04-12 DIAGNOSIS — E2839 Other primary ovarian failure: Secondary | ICD-10-CM

## 2022-04-12 DIAGNOSIS — E782 Mixed hyperlipidemia: Secondary | ICD-10-CM

## 2022-04-12 DIAGNOSIS — I10 Essential (primary) hypertension: Secondary | ICD-10-CM

## 2022-04-12 DIAGNOSIS — Z1231 Encounter for screening mammogram for malignant neoplasm of breast: Secondary | ICD-10-CM

## 2022-04-12 DIAGNOSIS — E6609 Other obesity due to excess calories: Secondary | ICD-10-CM

## 2022-04-12 DIAGNOSIS — Z6834 Body mass index (BMI) 34.0-34.9, adult: Secondary | ICD-10-CM | POA: Diagnosis not present

## 2022-04-12 LAB — POCT URINALYSIS DIPSTICK
Bilirubin, UA: NEGATIVE
Glucose, UA: NEGATIVE
Ketones, UA: NEGATIVE
Nitrite, UA: NEGATIVE
Protein, UA: NEGATIVE
Spec Grav, UA: 1.02 (ref 1.010–1.025)
Urobilinogen, UA: 0.2 E.U./dL
pH, UA: 6.5 (ref 5.0–8.0)

## 2022-04-12 MED ORDER — VALACYCLOVIR HCL 1 G PO TABS
ORAL_TABLET | ORAL | 2 refills | Status: DC
Start: 2022-04-12 — End: 2022-09-24

## 2022-04-12 MED ORDER — SIMVASTATIN 80 MG PO TABS: ORAL_TABLET | ORAL | 1 refills | Status: DC

## 2022-04-12 NOTE — Progress Notes (Signed)
I,Tianna Badgett,acting as a Education administrator for [employer], FNP.,have documented all relevant documentation on the behalf of [name], FNP,as directed by  [name], FNP while in the presence of [name], [place].  Subjective:     Patient ID: Peggy [name] , female    DOB: October 17, 1945 , 76 y.o.   MRN: [id number]   Chief Complaint  Patient presents with   Annual Exam    HPI  Pt presents today for HM. Patient refused to undress for physical exam. She has not taken her medications including her blood pressure medications this morning due to not eating yet. She went to the heart doctor furosemide changed from 20 mg to 40 mg in am.  They also added potassium chloride.   Hypertension This is a chronic problem. The current episode started more than 1 year ago. The problem is unchanged. The problem is controlled. Pertinent negatives include no anxiety, malaise/fatigue or palpitations. There are no associated agents to hypertension. Risk factors for coronary artery disease include obesity, sedentary lifestyle and post-menopausal state. Past treatments include ACE inhibitors and diuretics. There are no compliance problems.  There is no history of angina. There is no history of chronic renal disease.     Past Medical History:  Diagnosis Date   Anxiety    Arthritis    "all over"   Asthma    Bladder cancer ([place])    Chest pain at rest 02/18/2015   Chronic bronchitis [facility])    'get it q yr"   Chronic lower back pain    Coronary artery disease, non-occlusive 2005   Cardiac catheter 2005 and 2012 for "anginal symptoms "showed angiographically normal coronary arteries. -->  Negative Myoview June 2016.  Coronary calcification noted on chest CT -> calcium score 131, moderate proximal LAD and LCx disease on coronary CTA - (2019)   Daily headache    Deafness in left ear    Depression    DVT (deep venous thrombosis) ([place]) 2009/2010   "probably left leg"    GERD (gastroesophageal reflux disease)     History of kidney stones    Hyperlipidemia with target LDL less than 100    Hypertension    Leg swelling 12/12/2017   Nummular eczema    Sciatica 12/10/2012   Skin cancer    "right abdomen"     Family History  Problem Relation Age of Onset   Heart failure Mother    Cancer Mother    Hypertension Mother    Hypertension Father    Heart attack Father        Noted on cardiology intake form as "massive heart attack" at age 69   Heart failure Sister    Heart attack Sister        Not listed on her cardiology intake form   Breast cancer Sister    Heart failure Sister    Heart attack Sister        Not listed on her cardiology intake   Cancer Sister    Breast cancer Sister      Current Outpatient Medications:    acetaminophen (TYLENOL) 500 MG tablet, Take 1,000 mg by mouth 2 (two) times daily as needed for moderate pain or headache. , Disp: , Rfl:    amLODipine (NORVASC) 2.5 MG tablet, TAKE 1 TABLET(2.5 MG) BY MOUTH EVERY EVENING, Disp: 90 tablet, Rfl: 3   diazepam (VALIUM) 5 MG tablet, Take 5 mg by mouth every 6 (six) hours as needed., Disp: , Rfl:  diphenhydrAMINE (BENADRYL) 25 mg capsule, Take 25 mg by mouth daily as needed for allergies., Disp: , Rfl:    ezetimibe (ZETIA) 10 MG tablet, TAKE 1 TABLET(10 MG) BY MOUTH DAILY, Disp: 90 tablet, Rfl: 3   FLUoxetine (PROZAC) 40 MG capsule, Take 40 mg by mouth daily as needed (depression)., Disp: , Rfl:    furosemide (LASIX) 40 MG tablet, Take 1 tablet (40 mg total) by mouth daily., Disp: 90 tablet, Rfl: 3   metoprolol succinate (TOPROL-XL) 25 MG 24 hr tablet, TAKE 1 TABLET(25 MG) BY MOUTH DAILY, Disp: 90 tablet, Rfl: 3   nystatin powder, APPLY TOPICALLY 4 TIMES A DAY, Disp: 30 g, Rfl: 1   potassium chloride (KLOR-CON) 10 MEQ tablet, Take 1 tablet (10 mEq total) by mouth daily., Disp: 90 tablet, Rfl: 3   trandolapril (MAVIK) 2 MG tablet, Take 1 tablet (2 mg total) by mouth daily., Disp: 90 tablet, Rfl: 1   simvastatin (ZOCOR) 80 MG tablet,  TAKE 1 TABLET(80 MG) BY MOUTH AT BEDTIME, Disp: 90 tablet, Rfl: 1   valACYclovir (VALTREX) 1000 MG tablet, Take 1/2 tablet by mouth three times a day as needed for outbreak, Disp: 135 tablet, Rfl: 2   Allergies  Allergen Reactions   Penicillins Shortness Of Breath, Itching, Swelling, Rash and Other (See Comments)    Has patient had a PCN reaction causing immediate rash, facial/tongue/throat swelling, SOB or lightheadedness with hypotension: Yes Has patient had a PCN reaction causing severe rash involving mucus membranes or skin necrosis: No Has patient had a PCN reaction that required hospitalization: Yes Has patient had a PCN reaction occurring within the last 10 years: No If all of the above answers are "NO", then may proceed with Cephalosporin use.    Azithromycin Itching, Swelling and Rash   Levaquin [Levofloxacin In D5w] Hives, Itching, Swelling and Rash   Clindamycin/Lincomycin Hives   Erythromycin Base Itching   Keflex [Cephalexin] Itching and Rash   Sulfa Antibiotics Itching and Rash   Tetracyclines & Related Itching and Rash      The patient states she is status post hysterectomy.   Negative for Dysmenorrhea and Negative for Menorrhagia. Negative for: breast discharge, breast lump(s), breast pain and breast self exam. Associated symptoms include abnormal vaginal bleeding. Pertinent negatives include abnormal bleeding (hematology), anxiety, decreased libido, depression, difficulty falling sleep, dyspareunia, history of infertility, nocturia, sexual dysfunction, sleep disturbances, urinary incontinence, urinary urgency, vaginal discharge and vaginal itching. Diet regular; she does not eat a lot of pork. Eats mostly chicken and hamburger. She tries to eat a "no salt diet".   The patient states her exercise level is minimal with walking at least 30 minutes a day.   The patient's tobacco use is:  Social History   Tobacco Use  Smoking Status Never  Smokeless Tobacco Never   She has  been exposed to passive smoke. The patient's alcohol use is:  Social History   Substance and Sexual Activity  Alcohol Use No    Review of Systems  Constitutional: Negative.  Negative for malaise/fatigue.  HENT: Negative.    Eyes: Negative.   Respiratory: Negative.    Cardiovascular: Negative.  Negative for palpitations.  Gastrointestinal: Negative.   Endocrine: Negative.   Genitourinary: Negative.   Musculoskeletal: Negative.   Skin: Negative.   Allergic/Immunologic: Negative.   Neurological: Negative.   Hematological: Negative.   Psychiatric/Behavioral: Negative.       Today's Vitals   04/12/22 0852 04/12/22 0944  BP: (!) 140/70 (!) 144/70  Pulse:  95   Temp: 98.7 F (37.1 C)   TempSrc: Oral   Weight: 210 lb (95.3 kg)   Height: '5\' 5"'$  (1.651 m)    Body mass index is 34.95 kg/m.  Wt Readings from Last 3 Encounters:  04/12/22 210 lb (95.3 kg)  04/07/22 214 lb 12.8 oz (97.4 kg)  08/09/21 205 lb (93 kg)    Objective:  Physical Exam Vitals reviewed.  Constitutional:      General: She is not in acute distress.    Appearance: Normal appearance. She is well-developed. She is obese.  HENT:     Head: Normocephalic and atraumatic.     Right Ear: Hearing, tympanic membrane, ear canal and external ear normal. There is no impacted cerumen.     Left Ear: Hearing, tympanic membrane, ear canal and external ear normal. There is no impacted cerumen.     Nose:     Comments: Deferred - masked    Mouth/Throat:     Comments: Deferred - masked Eyes:     General: Lids are normal.     Extraocular Movements: Extraocular movements intact.     Conjunctiva/sclera: Conjunctivae normal.     Pupils: Pupils are equal, round, and reactive to light.     Funduscopic exam:    Right eye: No papilledema.        Left eye: No papilledema.  Neck:     Thyroid: No thyroid mass.     Vascular: No carotid bruit.  Cardiovascular:     Rate and Rhythm: Normal rate and regular rhythm.     Pulses:  Normal pulses.     Heart sounds: Normal heart sounds. No murmur heard. Pulmonary:     Effort: Pulmonary effort is normal. No respiratory distress.     Breath sounds: Normal breath sounds. No wheezing.  Chest:     Comments: Breast exam deferred patient refuses to get undressed.  Abdominal:     General: Abdomen is flat. Bowel sounds are normal. There is no distension.     Palpations: Abdomen is soft.     Tenderness: There is no abdominal tenderness.  Musculoskeletal:        General: No swelling (bilateral knees are swollen). Normal range of motion.     Cervical back: Full passive range of motion without pain, normal range of motion and neck supple.     Right lower leg: No edema.     Left lower leg: Edema (trace) present.  Skin:    General: Skin is warm and dry.     Capillary Refill: Capillary refill takes less than 2 seconds.     Comments: Unable to visualize full body due to patient refusing to get undressed  Neurological:     General: No focal deficit present.     Mental Status: She is alert and oriented to person, place, and time.     Cranial Nerves: No cranial nerve deficit.     Sensory: No sensory deficit.     Motor: No weakness.  Psychiatric:        Mood and Affect: Mood normal.        Behavior: Behavior normal.        Thought Content: Thought content normal.        Judgment: Judgment normal.         Assessment And Plan:     1. Health maintenance examination Behavior modifications discussed and diet history reviewed.   Pt will continue to exercise regularly and modify diet with low GI, plant  based foods and decrease intake of processed foods.  Recommend intake of daily multivitamin, Vitamin D, and calcium.  Recommend mammogram and colonoscopy for preventive screenings, as well as recommend immunizations that include influenza (will schedule for [place] next month), TDAP, and Shingles  2. Encounter for screening mammogram for breast cancer Pt instructed on Self Breast  Exam.According to ACOG guidelines Women aged 63 and older are recommended to get an annual mammogram. Form completed and given to patient contact the [facility] for appointment scheduling.  Pt encouraged to get annual mammogram Unable to do manual breast exam due to patient refuses to get undressed.  - MM Digital Screening; Future  3. Decreased estrogen level - DG Bone Density; Future  4. Class 1 obesity due to excess calories with serious comorbidity and body mass index (BMI) of 34.0 to 34.9 in adult Comments: Encouraged to continue exercising as tolerated.  She is encouraged to strive for BMI less than 30 to decrease cardiac risk. Advised to aim for at least 150 minutes of exercise per week.  5. Essential hypertension Comments: Blood pressure is slightly elevated, repeat is improved. Continue current medications and follow up with Cardiology. Advised to take medication at home - Microalbumin / Creatinine Urine Ratio - POCT Urinalysis Dipstick (81002)  6. Mixed hyperlipidemia Comments: Stable, continue statin.  - Lipid panel  7. Atherosclerosis of aorta (HCC) Comments: Continue statin, tolerating well.  - Lipid panel  8. Vitamin D deficiency - VITAMIN D 25 Hydroxy (Vit-D Deficiency, Fractures)  9. Other long term (current) drug therapy - CBC She is encouraged to strive for BMI less than 30 to decrease cardiac risk. Advised to aim for at least 150 minutes of exercise per week.   Patient was given opportunity to ask questions. Patient verbalized understanding of the plan and was able to repeat key elements of the plan. All questions were answered to their satisfaction.   [name], FNP   I, [name], FNP, have reviewed all documentation for this visit. The documentation on 04/12/22 for the exam, diagnosis, procedures, and orders are all accurate and complete.   THE PATIENT IS ENCOURAGED TO PRACTICE SOCIAL DISTANCING DUE TO THE COVID-19 PANDEMIC.

## 2022-04-12 NOTE — Patient Instructions (Signed)

## 2022-04-13 LAB — CBC
Hematocrit: 36.8 % (ref 34.0–46.6)
Hemoglobin: 11.6 g/dL (ref 11.1–15.9)
MCH: 29.7 pg (ref 26.6–33.0)
MCHC: 31.5 g/dL (ref 31.5–35.7)
MCV: 94 fL (ref 79–97)
Platelets: 319 10*3/uL (ref 150–450)
RBC: 3.91 x10E6/uL (ref 3.77–5.28)
RDW: 13.1 % (ref 11.7–15.4)
WBC: 9.5 10*3/uL (ref 3.4–10.8)

## 2022-04-13 LAB — LIPID PANEL
Chol/HDL Ratio: 2.7 ratio (ref 0.0–4.4)
Cholesterol, Total: 161 mg/dL (ref 100–199)
HDL: 60 mg/dL (ref >39)
LDL Chol Calc (NIH): 75 mg/dL (ref 0–99)
Triglycerides: 156 mg/dL — ABNORMAL HIGH (ref 0–149)
VLDL Cholesterol Cal: 26 mg/dL (ref 5–40)

## 2022-04-13 LAB — VITAMIN D 25 HYDROXY (VIT D DEFICIENCY, FRACTURES): Vit D, 25-Hydroxy: 16.3 ng/mL — ABNORMAL LOW (ref 30.0–100.0)

## 2022-04-13 LAB — MICROALBUMIN / CREATININE URINE RATIO
Creatinine, Urine: 160.2 mg/dL
Microalb/Creat Ratio: 3 mg/g creat (ref 0–29)
Microalbumin, Urine: 5.3 ug/mL

## 2022-04-14 ENCOUNTER — Other Ambulatory Visit: Payer: Self-pay

## 2022-04-14 MED ORDER — VITAMIN D (ERGOCALCIFEROL) 1.25 MG (50000 UNIT) PO CAPS: 50000.0000 [IU] | ORAL_CAPSULE | ORAL | 3 refills | Status: DC

## 2022-05-12 ENCOUNTER — Ambulatory Visit: Payer: Medicare HMO | Admitting: Adult Health

## 2022-05-16 ENCOUNTER — Ambulatory Visit (INDEPENDENT_AMBULATORY_CARE_PROVIDER_SITE_OTHER): Payer: Medicare HMO

## 2022-05-16 VITALS — BP 122/70 | HR 91 | Temp 98.6°F | Ht 65.0 in | Wt 210.0 lb

## 2022-05-16 DIAGNOSIS — Z23 Encounter for immunization: Secondary | ICD-10-CM

## 2022-05-16 NOTE — Progress Notes (Signed)
Patient presents today for a flu shot. YL,RMA 

## 2022-05-18 ENCOUNTER — Ambulatory Visit: Payer: Medicare HMO

## 2022-05-23 ENCOUNTER — Ambulatory Visit
Admission: RE | Admit: 2022-05-23 | Discharge: 2022-05-23 | Disposition: A | Discharge status: Home / Self Care | Payer: Medicare HMO | Source: Ambulatory Visit | Attending: Nurse Practitioner | Admitting: Nurse Practitioner

## 2022-05-23 DIAGNOSIS — Z1231 Encounter for screening mammogram for malignant neoplasm of breast: Secondary | ICD-10-CM

## 2022-05-23 NOTE — Progress Notes (Unsigned)
Cardiology Clinic Note   Patient Name: [name] Date of Encounter: 05/25/2022  Primary Care Provider:  [name], [place] Primary Cardiologist:  Glenetta Hew, MD  Patient Profile    Peggy [name] 76 year old female presents the clinic today for follow-up evaluation of her essential hypertension coronary artery disease.  Past Medical History    Past Medical History:  Diagnosis Date   Anxiety    Arthritis    "all over"   Asthma    Bladder cancer ([place])    Chest pain at rest 02/18/2015   Chronic bronchitis [facility])    'get it q yr"   Chronic lower back pain    Coronary artery disease, non-occlusive 2005   Cardiac catheter 2005 and 2012 for "anginal symptoms "showed angiographically normal coronary arteries. -->  Negative Myoview June 2016.  Coronary calcification noted on chest CT -> calcium score 131, moderate proximal LAD and LCx disease on coronary CTA - (2019)   Daily headache    Deafness in left ear    Depression    DVT (deep venous thrombosis) ([place]) 2009/2010   "probably left leg"    GERD (gastroesophageal reflux disease)    History of kidney stones    Hyperlipidemia with target LDL less than 100    Hypertension    Leg swelling 12/12/2017   Nummular eczema    Sciatica 12/10/2012   Skin cancer    "right abdomen"   Past Surgical History:  Procedure Laterality Date   ABDOMINAL HYSTERECTOMY  1980's   APPENDECTOMY Right    BACK SURGERY     x 2, lower   BLADDER TUMOR EXCISION     BREAST BIOPSY Right    BREAST EXCISIONAL BIOPSY Right    CAROTID ARTERY DOPPLERS   10/2017   Carotid Dopplers: 40-59% stenosis on the Right Internal Carotid, < 40% stenosis in the Left.  Both vertebral arteries show normal flow.  Left subclavian artery is normal, but there is some flow disturbance in the right subclavian artery.   COLONOSCOPY WITH PROPOFOL N/A 02/26/2015   Procedure: COLONOSCOPY WITH PROPOFOL;  Surgeon: Carol Ada, MD;  Location: [facility];  Service: Endoscopy;   Laterality: N/A;   COLONOSCOPY WITH PROPOFOL N/A 12/06/2017   Procedure: COLONOSCOPY WITH PROPOFOL;  Surgeon: Juanita Craver, MD;  Location: [facility];  Service: Endoscopy;  Laterality: N/A;   CORONARY CALCIUM SCORE & CT ANGIOGRAM  11/2017   Coronary calcium score 131 (moderate risk).  Moderate proximal LAD & LCx stenosis --> LOW RISK STUDY.  No evidence to suspect cardiac related chest pain --> correlates with cardiac catheterizations   ENTEROSCOPY N/A 12/14/2017   Procedure: ENTEROSCOPY;  Surgeon: Carol Ada, MD;  Location: [facility];  Service: Endoscopy;  Laterality: N/A;   ESOPHAGOGASTRODUODENOSCOPY Left 02/20/2015   Procedure: ESOPHAGOGASTRODUODENOSCOPY (EGD);  Surgeon: Milus Banister, MD;  Location: [facility];  Service: Endoscopy;  Laterality: Left;   ESOPHAGOGASTRODUODENOSCOPY (EGD) WITH PROPOFOL N/A 12/06/2017   Procedure: ESOPHAGOGASTRODUODENOSCOPY (EGD) WITH PROPOFOL;  Surgeon: Juanita Craver, MD;  Location: [facility];  Service: Endoscopy;  Laterality: N/A;   HOT HEMOSTASIS N/A 12/14/2017   Procedure: HOT HEMOSTASIS (ARGON PLASMA COAGULATION/BICAP);  Surgeon: Carol Ada, MD;  Location: [facility];  Service: Endoscopy;  Laterality: N/A;   JOINT REPLACEMENT     KNEE ARTHROSCOPY Right ~ 2000   LEFT HEART CATH AND CORONARY ANGIOGRAPHY  11/'05; 2/'12   Angiographically normal coronary arteries   LUMBAR DISC SURGERY  1980's X 1; ~ 2002   MEDIAL  PARTIAL KNEE REPLACEMENT Right 2001   NM MYOVIEW LTD  02/2015   EF 65%.  No reversible ischemia.  Possible scar along the cardiac apex and anteroseptal wall.  (Mild severity, medium-sized defect in the apex anteroseptal wall noted on both resting and stress images).  No inducible ischemia.  Read as LOW RISK   NM MYOVIEW LTD  12/09/2020   LOW RISK.  EF 55 to 65%.  No ischemia or infarct.   PARTIAL KNEE ARTHROPLASTY Left 2009   REPLACEMENT TOTAL KNEE BILATERAL Bilateral    REVISION TOTAL KNEE ARTHROPLASTY Left    SKIN CANCER  EXCISION Right    "abdomen"   TRANSTHORACIC ECHOCARDIOGRAM  06/'16; 2/'19; 1/'21    A) mod Conc LVH. EF 60-65%.  No RWMA. GR 1 D. Mild Ao Stenosis. Mild LA dilation.;  B) EF 60-65%.  Mild LVH.  No or W MA.  No significant valve disease.;l 01/2020: Normal LV systolic function.  Mild LVH.  GR 1 DD.  EF 60 to 65%.  No R WMA.  Trace AI.  No AS    Allergies  Allergies  Allergen Reactions   Penicillins Shortness Of Breath, Itching, Swelling, Rash and Other (See Comments)    Has patient had a PCN reaction causing immediate rash, facial/tongue/throat swelling, SOB or lightheadedness with hypotension: Yes Has patient had a PCN reaction causing severe rash involving mucus membranes or skin necrosis: No Has patient had a PCN reaction that required hospitalization: Yes Has patient had a PCN reaction occurring within the last 10 years: No If all of the above answers are "NO", then may proceed with Cephalosporin use.    Azithromycin Itching, Swelling and Rash   Levaquin [Levofloxacin In D5w] Hives, Itching, Swelling and Rash   Clindamycin/Lincomycin Hives   Erythromycin Base Itching   Keflex [Cephalexin] Itching and Rash   Sulfa Antibiotics Itching and Rash   Tetracyclines & Related Itching and Rash    History of Present Illness    Peggy [name] is a PMH of essential hypertension, CAD, PSVT, DDD, HLD, systolic ejection murmur, spinal stenosis, musculoskeletal chest pain, chronic left knee pain, palpitations, and underwent cardiac catheterization in 2005, 2012, which was followed by coronary CTA which showed moderate disease.  She underwent stress testing 4/22 which was nonischemic.  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  She remained on low-dose metoprolol for short bursts of PSVT with instructions to increase to 50 mg daily if she became symptomatic.  She was seen in follow-up by Bunnie Domino, DNP on 04/07/2022.  She had contacted the cardiology clinic 04/06/2022.  She complained  of intermittent periods of shortness of breath and left shoulder pain.  She indicated that she had increased lower extremity edema.  She reported that she was compliant with furosemide.  It was helping with her swelling.  She denied injury to her left shoulder, left chest, heavy lifting, pushing or pulling.  She was noted to have arthritis with chronic pain on her left side.  She was not taking any over-the-counter medications.  Her blood pressure was 130/52 with a pulse of 84.  Her EKG showed normal sinus rhythm.  It was felt that her left-sided discomfort was musculoskeletal in a chair and was reproducible.  She presents to the clinic today for follow-up evaluation and states she feels well.  She has been walking regularly and using a stationary bike.  She feels that her chest discomfort is better.  She still continues to notice occasional episodes  of sharp type chest discomfort that will come on at rest.  She reports that her discomfort will last for 1 to 2 minutes and dissipate on its own without intervention.  She denies exertional chest discomfort.  I reassured her that her discomfort is not related to cardiac issues.  We reviewed her previous cardiac catheterizations and stress testing.  She expressed understanding.  I will prescribe sublingual nitroglycerin, continue her current diet, continue her current physical activity, and plan follow-up in 6 months.  Today she denies chest pain, shortness of breath, lower extremity edema, fatigue, palpitations, melena, hematuria, hemoptysis, diaphoresis, weakness, presyncope, syncope, orthopnea, and PND.    Home Medications    Prior to Admission medications   Medication Sig Start Date End Date Taking? Authorizing Provider  acetaminophen (TYLENOL) 500 MG tablet Take 1,000 mg by mouth 2 (two) times daily as needed for moderate pain or headache.     [provider]  amLODipine (NORVASC) 2.5 MG tablet TAKE 1 TABLET(2.5 MG) BY MOUTH EVERY EVENING  09/06/21   Leonie Man, MD  diazepam (VALIUM) 5 MG tablet Take 5 mg by mouth every 6 (six) hours as needed. 04/04/22   [provider]  diphenhydrAMINE (BENADRYL) 25 mg capsule Take 25 mg by mouth daily as needed for allergies.    [provider]  ezetimibe (ZETIA) 10 MG tablet TAKE 1 TABLET(10 MG) BY MOUTH DAILY 09/06/21   Leonie Man, MD  FLUoxetine (PROZAC) 40 MG capsule Take 40 mg by mouth daily as needed (depression).    [provider]  furosemide (LASIX) 40 MG tablet Take 1 tablet (40 mg total) by mouth daily. 04/07/22 07/06/22  Lendon Colonel, NP  metoprolol succinate (TOPROL-XL) 25 MG 24 hr tablet TAKE 1 TABLET(25 MG) BY MOUTH DAILY 11/02/21   Leonie Man, MD  nystatin powder APPLY TOPICALLY 4 TIMES A DAY 04/05/21   [name], FNP  potassium chloride (KLOR-CON) 10 MEQ tablet Take 1 tablet (10 mEq total) by mouth daily. 04/07/22 07/06/22  Lendon Colonel, NP  simvastatin (ZOCOR) 80 MG tablet TAKE 1 TABLET(80 MG) BY MOUTH AT BEDTIME 04/12/22   [name], FNP  trandolapril (MAVIK) 2 MG tablet Take 1 tablet (2 mg total) by mouth daily. 02/15/22   [name], FNP  valACYclovir (VALTREX) 1000 MG tablet Take 1/2 tablet by mouth three times a day as needed for outbreak 04/12/22   [name], FNP  Vitamin D, Ergocalciferol, (DRISDOL) 1.25 MG (50000 UNIT) CAPS capsule Take 1 capsule (50,000 Units total) by mouth 2 (two) times a week. 04/17/22   [name], FNP    Family History    Family History  Problem Relation Age of Onset   Heart failure Mother    Cancer Mother    Hypertension Mother    Hypertension Father    Heart attack Father        Noted on cardiology intake form as "massive heart attack" at age 53   Heart failure Sister    Heart attack Sister        Not listed on her cardiology intake form   Breast cancer Sister    Heart failure Sister    Heart attack Sister        Not listed on her cardiology intake   Cancer Sister    Breast  cancer Sister    She indicated that her mother is deceased. She indicated that her father is deceased. She indicated that two of her eight sisters  are alive. She indicated that all of her five brothers are deceased.  Social History    Social History   Socioeconomic History   Marital status: Single    Spouse name: Not on file   Number of children: 1   Years of education: Not on file   Highest education level: Not on file  Occupational History   Occupation: Retired    Fish farm manager: RETIRED    Comment: Oncologist   Tobacco Use   Smoking status: Never   Smokeless tobacco: Never  Scientific laboratory technician Use: Never used  Substance and Sexual Activity   Alcohol use: No   Drug use: No   Sexual activity: Not Currently  Other Topics Concern   Not on file  Social History Narrative   Relocated from [place]   She is currently "'.  She lives with her son (who is autistic).  She herself is disabled, and not working.   She never smoked and never drank alcohol.   She enjoys walking 7 days a week.   Social Determinants of Health   Financial Resource Strain: Low Risk  (05/26/2021)   Overall Financial Resource Strain (CARDIA)    Difficulty of Paying Living Expenses: Not hard at all  Food Insecurity: No Food Insecurity (05/26/2021)   Hunger Vital Sign    Worried About Running Out of Food in the Last Year: Never true    Ran Out of Food in the Last Year: Never true  Transportation Needs: No Transportation Needs (05/26/2021)   PRAPARE - Hydrologist (Medical): No    Lack of Transportation (Non-Medical): No  Physical Activity: Inactive (05/26/2021)   Exercise Vital Sign    Days of Exercise per Week: 0 days    Minutes of Exercise per Session: 0 min  Stress: No Stress Concern Present (05/26/2021)   [place]    Feeling of Stress : Not at all  Social Connections: Moderately Isolated (07/30/2018)    Social Connection and Isolation Panel [NHANES]    Frequency of Communication with Friends and Family: Never    Frequency of Social Gatherings with Friends and Family: Never    Attends Religious Services: More than 4 times per year    Active Member of [employer] or Organizations: No    Attends Archivist Meetings: Never    Marital Status: Never married  Intimate Partner Violence: Not At Risk (07/30/2018)   Humiliation, Afraid, Rape, and Kick questionnaire    Fear of Current or Ex-Partner: No    Emotionally Abused: No    Physically Abused: No    Sexually Abused: No     Review of Systems    General:  No chills, fever, night sweats or weight changes.  Cardiovascular:  No chest pain, dyspnea on exertion, edema, orthopnea, palpitations, paroxysmal nocturnal dyspnea. Dermatological: No rash, lesions/masses Respiratory: No cough, dyspnea Urologic: No hematuria, dysuria Abdominal:   No nausea, vomiting, diarrhea, bright red blood per rectum, melena, or hematemesis Neurologic:  No visual changes, wkns, changes in mental status. All other systems reviewed and are otherwise negative except as noted above.  Physical Exam    VS:  BP 124/64   Pulse 76   Ht '5\' 5"'$  (1.651 m)   Wt 212 lb (96.2 kg)   SpO2 98%   BMI 35.28 kg/m  , BMI Body mass index is 35.28 kg/m. GEN: Well nourished, well developed, in no acute distress. HEENT:  normal. Neck: Supple, no JVD, carotid bruits, or masses. Cardiac: RRR, no murmurs, rubs, or gallops. No clubbing, cyanosis, edema.  Radials/DP/PT 2+ and equal bilaterally.  Respiratory:  Respirations regular and unlabored, clear to auscultation bilaterally. GI: Soft, nontender, nondistended, BS + x 4. MS: no deformity or atrophy. Skin: warm and dry, no rash. Neuro:  Strength and sensation are intact. Psych: Normal affect.  Accessory Clinical Findings    Recent Labs: 08/09/2021: ALT 12 04/07/2022: BUN 11; Creatinine, Ser 1.02; Magnesium 2.0; Potassium 4.0;  Sodium 143 04/12/2022: Hemoglobin 11.6; Platelets 319   Recent Lipid Panel    Component Value Date/Time   CHOL 161 04/12/2022 0938   TRIG 156 (H) 04/12/2022 0938   HDL 60 04/12/2022 0938   CHOLHDL 2.7 04/12/2022 0938   CHOLHDL 4.2 12/10/2012 1132   VLDL 35 12/10/2012 1132   LDLCALC 75 04/12/2022 0938         ECG personally reviewed by me today-none today.  Echocardiogram 01/06/2020  IMPRESSIONS     1. Normal LV systolic function; mild LVH; grade 1 diastolic dysfunction;  trace AI.   2. Left ventricular ejection fraction, by estimation, is 60 to 65%. The  left ventricle has normal function. The left ventricle has no regional  wall motion abnormalities. There is mild left ventricular hypertrophy.  Left ventricular diastolic parameters  are consistent with Grade I diastolic dysfunction (impaired relaxation).   3. Right ventricular systolic function is normal. The right ventricular  size is normal.   4. The mitral valve is normal in structure. Trivial mitral valve  regurgitation. No evidence of mitral stenosis.   5. The aortic valve has an indeterminant number of cusps. Aortic valve  regurgitation is trivial. No aortic stenosis is present.   6. The inferior vena cava is normal in size with greater than 50%  respiratory variability, suggesting right atrial pressure of 3 mmHg.   Assessment & Plan   1.  Chest discomfort-no chest pain today.  Previously noted to have musculoskeletal left-sided chest pain.  It was reproducible during exam.  She was instructed to follow-up with her PCP for further evaluation and treatment if persisted.  Voltaren gel as previously recommended. No plans for ischemic evaluation at this time Maintain physical activity  Essential hypertension-BP today 124/64 Continue amlodipine, metoprolol, Heart healthy low-sodium diet-salty 6 given Increase physical activity as tolerated  Hyperlipidemia-LDL 75 on 04/12/22 Continue simvastatin, ezetimibe Heart  healthy low-sodium high-fiber diet Increase physical activity as tolerated  Disposition: Follow-up with Dr. Ellyn Hack in 6 months.   Jossie Ng. Venus Gilles NP-C     05/25/2022, 11:59 AM [place] [address] Office [phone] Fax [phone]  Notice: This dictation was prepared with Dragon dictation along with smaller phrase technology. Any transcriptional errors that result from this process are unintentional and may not be corrected upon review.  I spent 14 minutes examining this patient, reviewing medications, and using patient centered shared decision making involving her cardiac care.  Prior to her visit I spent greater than 20 minutes reviewing her past medical history,  medications, and prior cardiac tests.

## 2022-05-25 ENCOUNTER — Encounter: Payer: Self-pay | Admitting: General Practice

## 2022-05-25 ENCOUNTER — Ambulatory Visit: Payer: Medicare HMO | Attending: General Practice | Admitting: General Practice

## 2022-05-25 VITALS — BP 124/64 | HR 76 | Ht 65.0 in | Wt 212.0 lb

## 2022-05-25 DIAGNOSIS — E7800 Pure hypercholesterolemia, unspecified: Secondary | ICD-10-CM | POA: Diagnosis not present

## 2022-05-25 DIAGNOSIS — I10 Essential (primary) hypertension: Secondary | ICD-10-CM | POA: Diagnosis not present

## 2022-05-25 DIAGNOSIS — R079 Chest pain, unspecified: Secondary | ICD-10-CM | POA: Diagnosis not present

## 2022-05-25 MED ORDER — NITROGLYCERIN 0.4 MG SL SUBL: 0.4000 mg | SUBLINGUAL_TABLET | SUBLINGUAL | 3 refills | Status: DC | PRN

## 2022-05-25 NOTE — Patient Instructions (Addendum)
Medication Instructions:  The current medical regimen is effective;  continue present plan and medications as directed. Please refer to the Current Medication list given to you today.  *If you need a refill on your cardiac medications before your next appointment, please call your pharmacy*   Lab Work: NONE If you have labs (blood work) drawn today and your tests are completely normal, you will receive your results only by: [place] (if you have MyChart) OR A paper copy in the mail If you have any lab test that is abnormal or we need to change your treatment, we will call you to review the results.   Testing/Procedures: NONE   Follow-Up: At [facility], you and your health needs are our priority.  As part of our continuing mission to provide you with exceptional heart care, we have created designated Provider Care Teams.  These Care Teams include your primary Cardiologist (physician) and Advanced Practice Providers (APPs -  Physician Assistants and Nurse Practitioners) who all work together to provide you with the care you need, when you need it.  We recommend signing up for the patient portal called "MyChart".  Sign up information is provided on this After Visit Summary.  MyChart is used to connect with patients for Virtual Visits (Telemedicine).  Patients are able to view lab/test results, encounter notes, upcoming appointments, etc.  Non-urgent messages can be sent to your provider as well.   To learn more about what you can do with MyChart, go to [email or web address].    Your next appointment:   6 month(s)  The format for your next appointment:   In Person  Provider:   Glenetta Hew, MD     Other Instructions PLEASE READ AND FOLLOW SALTY 6 ATTACHED  Important Information About Sugar

## 2022-06-01 ENCOUNTER — Ambulatory Visit (INDEPENDENT_AMBULATORY_CARE_PROVIDER_SITE_OTHER): Payer: Medicare HMO

## 2022-06-01 VITALS — Ht 65.0 in | Wt 210.0 lb

## 2022-06-01 DIAGNOSIS — Z0000 Encounter for general adult medical examination without abnormal findings: Secondary | ICD-10-CM

## 2022-06-01 NOTE — Patient Instructions (Signed)
Peggy [name] , Thank you for taking time to come for your Medicare Wellness Visit. I appreciate your ongoing commitment to your health goals. Please review the following plan we discussed and let me know if I can assist you in the future.   Screening recommendations/referrals: Colonoscopy: not required Mammogram: completed 05/23/2022, due 05/25/2023 Bone Density: scheduled 10/09/2022 Recommended yearly ophthalmology/optometry visit for glaucoma screening and checkup Recommended yearly dental visit for hygiene and checkup  Vaccinations: Influenza vaccine: completed 05/16/2022 Pneumococcal vaccine: completed 08/09/2021 Tdap vaccine: completed 11/16/2017, due 11/17/2027 Shingles vaccine: completed   Covid-19: 07/05/2021, 12/29/2020, 06/16/2020, 10/24/2019, 09/23/2019  Advanced directives: Advance directive discussed with you today.   Conditions/risks identified: none  Next appointment: Follow up in one year for your annual wellness visit    Preventive Care 65 Years and Older, Female Preventive care refers to lifestyle choices and visits with your health care provider that can promote health and wellness. What does preventive care include? A yearly physical exam. This is also called an annual well check. Dental exams once or twice a year. Routine eye exams. Ask your health care provider how often you should have your eyes checked. Personal lifestyle choices, including: Daily care of your teeth and gums. Regular physical activity. Eating a healthy diet. Avoiding tobacco and drug use. Limiting alcohol use. Practicing safe sex. Taking low-dose aspirin every day. Taking vitamin and mineral supplements as recommended by your health care provider. What happens during an annual well check? The services and screenings done by your health care provider during your annual well check will depend on your age, overall health, lifestyle risk factors, and family history of disease. Counseling  Your health  care provider may ask you questions about your: Alcohol use. Tobacco use. Drug use. Emotional well-being. Home and relationship well-being. Sexual activity. Eating habits. History of falls. Memory and ability to understand (cognition). Work and work Statistician. Reproductive health. Screening  You may have the following tests or measurements: Height, weight, and BMI. Blood pressure. Lipid and cholesterol levels. These may be checked every 5 years, or more frequently if you are over 67 years old. Skin check. Lung cancer screening. You may have this screening every year starting at age 66 if you have a 30-pack-year history of smoking and currently smoke or have quit within the past 15 years. Fecal occult blood test (FOBT) of the stool. You may have this test every year starting at age 59. Flexible sigmoidoscopy or colonoscopy. You may have a sigmoidoscopy every 5 years or a colonoscopy every 10 years starting at age 76. Hepatitis C blood test. Hepatitis B blood test. Sexually transmitted disease (STD) testing. Diabetes screening. This is done by checking your blood sugar (glucose) after you have not eaten for a while (fasting). You may have this done every 1-3 years. Bone density scan. This is done to screen for osteoporosis. You may have this done starting at age 3. Mammogram. This may be done every 1-2 years. Talk to your health care provider about how often you should have regular mammograms. Talk with your health care provider about your test results, treatment options, and if necessary, the need for more tests. Vaccines  Your health care provider may recommend certain vaccines, such as: Influenza vaccine. This is recommended every year. Tetanus, diphtheria, and acellular pertussis (Tdap, Td) vaccine. You may need a Td booster every 10 years. Zoster vaccine. You may need this after age 63. Pneumococcal 13-valent conjugate (PCV13) vaccine. One dose is recommended after age  39. Pneumococcal  polysaccharide (PPSV23) vaccine. One dose is recommended after age 58. Talk to your health care provider about which screenings and vaccines you need and how often you need them. This information is not intended to replace advice given to you by your health care provider. Make sure you discuss any questions you have with your health care provider. Document Released: 09/17/2015 Document Revised: 05/10/2016 Document Reviewed: 06/22/2015 Elsevier Interactive Patient Education  2017 [place] Prevention in the Home Falls can cause injuries. They can happen to people of all ages. There are many things you can do to make your home safe and to help prevent falls. What can I do on the outside of my home? Regularly fix the edges of walkways and driveways and fix any cracks. Remove anything that might make you trip as you walk through a door, such as a raised step or threshold. Trim any bushes or trees on the path to your home. Use bright outdoor lighting. Clear any walking paths of anything that might make someone trip, such as rocks or tools. Regularly check to see if handrails are loose or broken. Make sure that both sides of any steps have handrails. Any raised decks and porches should have guardrails on the edges. Have any leaves, snow, or ice cleared regularly. Use sand or salt on walking paths during winter. Clean up any spills in your garage right away. This includes oil or grease spills. What can I do in the bathroom? Use night lights. Install grab bars by the toilet and in the tub and shower. Do not use towel bars as grab bars. Use non-skid mats or decals in the tub or shower. If you need to sit down in the shower, use a plastic, non-slip stool. Keep the floor dry. Clean up any water that spills on the floor as soon as it happens. Remove soap buildup in the tub or shower regularly. Attach bath mats securely with double-sided non-slip rug tape. Do not have throw  rugs and other things on the floor that can make you trip. What can I do in the bedroom? Use night lights. Make sure that you have a light by your bed that is easy to reach. Do not use any sheets or blankets that are too big for your bed. They should not hang down onto the floor. Have a firm chair that has side arms. You can use this for support while you get dressed. Do not have throw rugs and other things on the floor that can make you trip. What can I do in the kitchen? Clean up any spills right away. Avoid walking on wet floors. Keep items that you use a lot in easy-to-reach places. If you need to reach something above you, use a strong step stool that has a grab bar. Keep electrical cords out of the way. Do not use floor polish or wax that makes floors slippery. If you must use wax, use non-skid floor wax. Do not have throw rugs and other things on the floor that can make you trip. What can I do with my stairs? Do not leave any items on the stairs. Make sure that there are handrails on both sides of the stairs and use them. Fix handrails that are broken or loose. Make sure that handrails are as long as the stairways. Check any carpeting to make sure that it is firmly attached to the stairs. Fix any carpet that is loose or worn. Avoid having throw rugs at the top or  bottom of the stairs. If you do have throw rugs, attach them to the floor with carpet tape. Make sure that you have a light switch at the top of the stairs and the bottom of the stairs. If you do not have them, ask someone to add them for you. What else can I do to help prevent falls? Wear shoes that: Do not have high heels. Have rubber bottoms. Are comfortable and fit you well. Are closed at the toe. Do not wear sandals. If you use a stepladder: Make sure that it is fully opened. Do not climb a closed stepladder. Make sure that both sides of the stepladder are locked into place. Ask someone to hold it for you, if  possible. Clearly mark and make sure that you can see: Any grab bars or handrails. First and last steps. Where the edge of each step is. Use tools that help you move around (mobility aids) if they are needed. These include: Canes. Walkers. Scooters. Crutches. Turn on the lights when you go into a dark area. Replace any light bulbs as soon as they burn out. Set up your furniture so you have a clear path. Avoid moving your furniture around. If any of your floors are uneven, fix them. If there are any pets around you, be aware of where they are. Review your medicines with your doctor. Some medicines can make you feel dizzy. This can increase your chance of falling. Ask your doctor what other things that you can do to help prevent falls. This information is not intended to replace advice given to you by your health care provider. Make sure you discuss any questions you have with your health care provider. Document Released: 06/17/2009 Document Revised: 01/27/2016 Document Reviewed: 09/25/2014 Elsevier Interactive Patient Education  2017 [employer].

## 2022-06-01 NOTE — Progress Notes (Signed)
I connected with Shanon Ace today by telephone and verified that I am speaking with the correct person using two identifiers. Location patient: home Location provider: work Persons participating in the virtual visit: Nolan Lasser, Glenna Durand LPN.   I discussed the limitations, risks, security and privacy concerns of performing an evaluation and management service by telephone and the availability of in person appointments. I also discussed with the patient that there may be a patient responsible charge related to this service. The patient expressed understanding and verbally consented to this telephonic visit.    Interactive audio and video telecommunications were attempted between this provider and patient, however failed, due to patient having technical difficulties OR patient did not have access to video capability.  We continued and completed visit with audio only.     Vital signs may be patient reported or missing.  Subjective:   Peggy [name] is a 76 y.o. female who presents for Medicare Annual (Subsequent) preventive examination.  Review of Systems     Cardiac Risk Factors include: advanced age (>14mn, >>36women);dyslipidemia;hypertension;obesity (BMI >30kg/m2)     Objective:    Today's Vitals   06/01/22 0911 06/01/22 0912  Weight: 210 lb (95.3 kg)   Height: '5\' 5"'$  (1.651 m)   PainSc:  10-Worst pain ever   Body mass index is 34.95 kg/m.     06/01/2022    9:18 AM 05/26/2021   10:31 AM 08/18/2020    9:39 AM 12/10/2019    9:24 AM 08/13/2019   10:49 AM 07/15/2019    9:37 AM 07/30/2018    3:06 PM  Advanced Directives  Does Patient Have a Medical Advance Directive? No No No No Yes No No  Type of Advance Directive     [place] Chart?     No - copy requested    Would patient like information on creating a medical advance directive?  No - Patient declined    No - Patient declined Yes (MAU/Ambulatory/Procedural  Areas - Information given)    Current Medications (verified) Outpatient Encounter Medications as of 06/01/2022  Medication Sig   acetaminophen (TYLENOL) 500 MG tablet Take 1,000 mg by mouth 2 (two) times daily as needed for moderate pain or headache.    amLODipine (NORVASC) 2.5 MG tablet TAKE 1 TABLET(2.5 MG) BY MOUTH EVERY EVENING   diazepam (VALIUM) 5 MG tablet Take 5 mg by mouth every 6 (six) hours as needed.   diphenhydrAMINE (BENADRYL) 25 mg capsule Take 25 mg by mouth daily as needed for allergies.   ezetimibe (ZETIA) 10 MG tablet TAKE 1 TABLET(10 MG) BY MOUTH DAILY   FLUoxetine (PROZAC) 40 MG capsule Take 40 mg by mouth daily as needed (depression).   furosemide (LASIX) 40 MG tablet Take 1 tablet (40 mg total) by mouth daily.   metoprolol succinate (TOPROL-XL) 25 MG 24 hr tablet TAKE 1 TABLET(25 MG) BY MOUTH DAILY   nitroGLYCERIN (NITROSTAT) 0.4 MG SL tablet Place 1 tablet (0.4 mg total) under the tongue every 5 (five) minutes as needed for chest pain.   Potassium 99 MG TABS Take 99 mg by mouth daily. Take 1 tablet by mouth daily.   simvastatin (ZOCOR) 80 MG tablet TAKE 1 TABLET(80 MG) BY MOUTH AT BEDTIME   trandolapril (MAVIK) 2 MG tablet Take 1 tablet (2 mg total) by mouth daily.   valACYclovir (VALTREX) 1000 MG tablet Take 1/2 tablet by mouth three times a day as  needed for outbreak   Vitamin D, Ergocalciferol, (DRISDOL) 1.25 MG (50000 UNIT) CAPS capsule Take 1 capsule (50,000 Units total) by mouth 2 (two) times a week.   No facility-administered encounter medications on file as of 06/01/2022.    Allergies (verified) Penicillins, Azithromycin, Levaquin [levofloxacin in d5w], Clindamycin/lincomycin, Erythromycin base, Keflex [cephalexin], Sulfa antibiotics, and Tetracyclines & related   History: Past Medical History:  Diagnosis Date   Anxiety    Arthritis    "all over"   Asthma    Bladder cancer ([place])    Chest pain at rest 02/18/2015   Chronic bronchitis ([place])    'get it q  yr"   Chronic lower back pain    Coronary artery disease, non-occlusive 2005   Cardiac catheter 2005 and 2012 for "anginal symptoms "showed angiographically normal coronary arteries. -->  Negative Myoview June 2016.  Coronary calcification noted on chest CT -> calcium score 131, moderate proximal LAD and LCx disease on coronary CTA - (2019)   Daily headache    Deafness in left ear    Depression    DVT (deep venous thrombosis) ([place]) 2009/2010   "probably left leg"    GERD (gastroesophageal reflux disease)    History of kidney stones    Hyperlipidemia with target LDL less than 100    Hypertension    Leg swelling 12/12/2017   Nummular eczema    Sciatica 12/10/2012   Skin cancer    "right abdomen"   Past Surgical History:  Procedure Laterality Date   ABDOMINAL HYSTERECTOMY  1980's   APPENDECTOMY Right    BACK SURGERY     x 2, lower   BLADDER TUMOR EXCISION     BREAST BIOPSY Right    BREAST EXCISIONAL BIOPSY Right    CAROTID ARTERY DOPPLERS   10/2017   Carotid Dopplers: 40-59% stenosis on the Right Internal Carotid, < 40% stenosis in the Left.  Both vertebral arteries show normal flow.  Left subclavian artery is normal, but there is some flow disturbance in the right subclavian artery.   COLONOSCOPY WITH PROPOFOL N/A 02/26/2015   Procedure: COLONOSCOPY WITH PROPOFOL;  Surgeon: Carol Ada, MD;  Location: [facility];  Service: Endoscopy;  Laterality: N/A;   COLONOSCOPY WITH PROPOFOL N/A 12/06/2017   Procedure: COLONOSCOPY WITH PROPOFOL;  Surgeon: Juanita Craver, MD;  Location: [facility];  Service: Endoscopy;  Laterality: N/A;   CORONARY CALCIUM SCORE & CT ANGIOGRAM  11/2017   Coronary calcium score 131 (moderate risk).  Moderate proximal LAD & LCx stenosis --> LOW RISK STUDY.  No evidence to suspect cardiac related chest pain --> correlates with cardiac catheterizations   ENTEROSCOPY N/A 12/14/2017   Procedure: ENTEROSCOPY;  Surgeon: Carol Ada, MD;  Location: [facility];   Service: Endoscopy;  Laterality: N/A;   ESOPHAGOGASTRODUODENOSCOPY Left 02/20/2015   Procedure: ESOPHAGOGASTRODUODENOSCOPY (EGD);  Surgeon: Milus Banister, MD;  Location: [facility];  Service: Endoscopy;  Laterality: Left;   ESOPHAGOGASTRODUODENOSCOPY (EGD) WITH PROPOFOL N/A 12/06/2017   Procedure: ESOPHAGOGASTRODUODENOSCOPY (EGD) WITH PROPOFOL;  Surgeon: Juanita Craver, MD;  Location: [facility];  Service: Endoscopy;  Laterality: N/A;   HOT HEMOSTASIS N/A 12/14/2017   Procedure: HOT HEMOSTASIS (ARGON PLASMA COAGULATION/BICAP);  Surgeon: Carol Ada, MD;  Location: [facility];  Service: Endoscopy;  Laterality: N/A;   JOINT REPLACEMENT     KNEE ARTHROSCOPY Right ~ 2000   LEFT HEART CATH AND CORONARY ANGIOGRAPHY  11/'05; 2/'12   Angiographically normal coronary arteries   LUMBAR DISC SURGERY  1980's X 1; ~ 2002  MEDIAL PARTIAL KNEE REPLACEMENT Right 2001   NM MYOVIEW LTD  02/2015   EF 65%.  No reversible ischemia.  Possible scar along the cardiac apex and anteroseptal wall.  (Mild severity, medium-sized defect in the apex anteroseptal wall noted on both resting and stress images).  No inducible ischemia.  Read as LOW RISK   NM MYOVIEW LTD  12/09/2020   LOW RISK.  EF 55 to 65%.  No ischemia or infarct.   PARTIAL KNEE ARTHROPLASTY Left 2009   REPLACEMENT TOTAL KNEE BILATERAL Bilateral    REVISION TOTAL KNEE ARTHROPLASTY Left    SKIN CANCER EXCISION Right    "abdomen"   TRANSTHORACIC ECHOCARDIOGRAM  06/'16; 2/'19; 1/'21    A) mod Conc LVH. EF 60-65%.  No RWMA. GR 1 D. Mild Ao Stenosis. Mild LA dilation.;  B) EF 60-65%.  Mild LVH.  No or W MA.  No significant valve disease.;l 01/2020: Normal LV systolic function.  Mild LVH.  GR 1 DD.  EF 60 to 65%.  No R WMA.  Trace AI.  No AS   Family History  Problem Relation Age of Onset   Heart failure Mother    Cancer Mother    Hypertension Mother    Hypertension Father    Heart attack Father        Noted on cardiology intake form as "massive  heart attack" at age 18   Heart failure Sister    Heart attack Sister        Not listed on her cardiology intake form   Breast cancer Sister    Heart failure Sister    Heart attack Sister        Not listed on her cardiology intake   Cancer Sister    Breast cancer Sister    Social History   Socioeconomic History   Marital status: Single    Spouse name: Not on file   Number of children: 1   Years of education: Not on file   Highest education level: Not on file  Occupational History   Occupation: Retired    Fish farm manager: RETIRED    Comment: Oncologist   Tobacco Use   Smoking status: Never   Smokeless tobacco: Never  Scientific laboratory technician Use: Never used  Substance and Sexual Activity   Alcohol use: No   Drug use: No   Sexual activity: Not Currently  Other Topics Concern   Not on file  Social History Narrative   Relocated from [place]   She is currently "'.  She lives with her son (who is autistic).  She herself is disabled, and not working.   She never smoked and never drank alcohol.   She enjoys walking 7 days a week.   Social Determinants of Health   Financial Resource Strain: Low Risk  (06/01/2022)   Overall Financial Resource Strain (CARDIA)    Difficulty of Paying Living Expenses: Not hard at all  Food Insecurity: No Food Insecurity (06/01/2022)   Hunger Vital Sign    Worried About Running Out of Food in the Last Year: Never true    Ran Out of Food in the Last Year: Never true  Transportation Needs: No Transportation Needs (06/01/2022)   PRAPARE - Hydrologist (Medical): No    Lack of Transportation (Non-Medical): No  Physical Activity: Inactive (06/01/2022)   Exercise Vital Sign    Days of Exercise per Week: 0 days    Minutes of Exercise per  Session: 0 min  Stress: No Stress Concern Present (06/01/2022)   [place]    Feeling of Stress : Not at all  Social  Connections: Moderately Isolated (07/30/2018)   Social Connection and Isolation Panel [NHANES]    Frequency of Communication with Friends and Family: Never    Frequency of Social Gatherings with Friends and Family: Never    Attends Religious Services: More than 4 times per year    Active Member of [employer] or Organizations: No    Attends Music therapist: Never    Marital Status: Never married    Tobacco Counseling Counseling given: Not Answered   Clinical Intake:  Pre-visit preparation completed: Yes  Pain : 0-10 Pain Score: 10-Worst pain ever Pain Type: Chronic pain Pain Location: Leg Pain Orientation: Left, Right Pain Descriptors / Indicators: Constant Pain Onset: More than a month ago Pain Frequency: Constant     Nutritional Status: BMI > 30  Obese Nutritional Risks: Nausea/ vomitting/ diarrhea (a little nausea) Diabetes: No  How often do you need to have someone help you when you read instructions, pamphlets, or other written materials from your doctor or pharmacy?: 1 - Never What is the last grade level you completed in school?: some college  Diabetic?no   Interpreter Needed?: No  Information entered by :: NAllen LPN   Activities of Daily Living    06/01/2022    9:20 AM  In your present state of health, do you have any difficulty performing the following activities:  Hearing? 1  Comment deaf in left ear  Vision? 0  Difficulty concentrating or making decisions? 1  Walking or climbing stairs? 1  Dressing or bathing? 0  Doing errands, shopping? 0  Preparing Food and eating ? N  Using the Toilet? N  In the past six months, have you accidently leaked urine? Y  Comment with hard laughter  Do you have problems with loss of bowel control? N  Managing your Medications? N  Managing your Finances? N  Housekeeping or managing your Housekeeping? N    Patient Care Team: [name], FNP as PCP - General (General Practice) Leonie Man, MD as  PCP - Cardiology (Cardiology)  Indicate any recent Medical Services you may have received from other than [facility] providers in the past year (date may be approximate).     Assessment:   This is a routine wellness examination for [employer].  Hearing/Vision screen Vision Screening - Comments:: Regular eye exams, Dr. Katy Fitch   Dietary issues and exercise activities discussed: Current Exercise Habits: The patient does not participate in regular exercise at present   Goals Addressed             This Visit's Progress    Patient Stated       06/01/2022, no goals       Depression Screen    06/01/2022    9:20 AM 04/12/2022    8:45 AM 05/26/2021   10:33 AM 08/18/2020    9:39 AM 08/18/2020    8:48 AM 08/13/2019   10:51 AM 07/01/2019    9:32 AM  PHQ 2/9 Scores  PHQ - 2 Score 0 0 0 0 0 0 0  PHQ- 9 Score      0     Fall Risk    06/01/2022    9:19 AM 04/12/2022    8:45 AM 05/26/2021   10:32 AM 08/18/2020    9:39 AM 08/18/2020  8:48 AM  Fall Risk   Falls in the past year? 0 0 1 0 0  Comment   got dizzy    Number falls in past yr: 0 0 0    Injury with Fall? 0 0 0    Risk for fall due to : Impaired balance/gait;Impaired mobility;Medication side effect No Fall Risks Impaired mobility;Medication side effect Medication side effect   Follow up Falls prevention discussed;Falls evaluation completed;Education provided Falls evaluation completed Falls evaluation completed;Education provided;Falls prevention discussed Falls evaluation completed;Education provided;Falls prevention discussed     FALL RISK PREVENTION PERTAINING TO THE HOME:  Any stairs in or around the home? No  If so, are there any without handrails?  Home free of loose throw rugs in walkways, pet beds, electrical cords, etc? Yes  Adequate lighting in your home to reduce risk of falls? Yes   ASSISTIVE DEVICES UTILIZED TO PREVENT FALLS:  Life alert? Yes  Use of a cane, walker or w/c? Yes  Grab bars in the bathroom? Yes   Shower chair or bench in shower? No  Elevated toilet seat or a handicapped toilet? No   TIMED UP AND GO:  Was the test performed? No .      Cognitive Function:        06/01/2022    9:22 AM 05/26/2021   10:35 AM 08/18/2020    9:41 AM 08/13/2019   10:54 AM 07/30/2018    3:07 PM  6CIT Screen  What Year? 0 points 0 points 0 points 0 points 0 points  What month? 0 points 0 points 0 points 0 points 0 points  What time? 0 points 0 points 0 points 0 points 0 points  Count back from 20 0 points 0 points 0 points 0 points 0 points  Months in reverse 0 points 0 points 0 points 0 points 0 points  Repeat phrase 0 points 0 points 0 points 0 points 0 points  Total Score 0 points 0 points 0 points 0 points 0 points    Immunizations Immunization History  Administered Date(s) Administered   Fluad Quad(high Dose 65+) 05/25/2020, 06/28/2021, 05/16/2022   Influenza, High Dose Seasonal PF 07/01/2019   Influenza-Unspecified 05/13/2018   PFIZER(Purple Top)SARS-COV-2 Vaccination 09/23/2019, 10/24/2019, 06/16/2020, 12/29/2020, 07/05/2021   Pneumococcal Conjugate-13 09/03/2018   Pneumococcal Polysaccharide-23 08/09/2021   Tdap 11/16/2017   Zoster Recombinat (Shingrix) 09/04/2016, 02/02/2017    TDAP status: Up to date  Flu Vaccine status: Up to date  Pneumococcal vaccine status: Up to date  Covid-19 vaccine status: Completed vaccines  Qualifies for Shingles Vaccine? Yes   Zostavax completed Yes   Shingrix Completed?: Yes  Screening Tests Health Maintenance  Topic Date Due   COVID-19 Vaccine (6 - Pfizer risk series) 08/30/2021   TETANUS/TDAP  11/17/2027   Pneumonia Vaccine 26+ Years old  Completed   INFLUENZA VACCINE  Completed   DEXA SCAN  Completed   Hepatitis C Screening  Completed   Zoster Vaccines- Shingrix  Completed   HPV VACCINES  Aged Out   COLONOSCOPY (Pts 45-101yr Insurance coverage will need to be confirmed)  Discontinued    Health Maintenance  Health Maintenance  Due  Topic Date Due   COVID-19 Vaccine (6 - Pfizer risk series) 08/30/2021    Colorectal cancer screening: No longer required.   Mammogram status: Completed 05/23/2022. Repeat every year  Bone Density status: scheduled 10/09/2022  Lung Cancer Screening: (Low Dose CT Chest recommended if Age 78-80years, 30 pack-year currently smoking OR have quit w/in  15years.) does not qualify.   Lung Cancer Screening Referral: no  Additional Screening:  Hepatitis C Screening: does qualify; Completed 07/01/2019  Vision Screening: Recommended annual ophthalmology exams for early detection of glaucoma and other disorders of the eye. Is the patient up to date with their annual eye exam?  Yes  Who is the provider or what is the name of the office in which the patient attends annual eye exams? Dr. Katy Fitch If pt is not established with a provider, would they like to be referred to a provider to establish care? No .   Dental Screening: Recommended annual dental exams for proper oral hygiene  Community Resource Referral / Chronic Care Management: CRR required this visit?  No   CCM required this visit?  No      Plan:     I have personally reviewed and noted the following in the patient's chart:   Medical and social history Use of alcohol, tobacco or illicit drugs  Current medications and supplements including opioid prescriptions. Patient is not currently taking opioid prescriptions. Functional ability and status Nutritional status Physical activity Advanced directives List of other physicians Hospitalizations, surgeries, and ER visits in previous 12 months Vitals Screenings to include cognitive, depression, and falls Referrals and appointments  In addition, I have reviewed and discussed with patient certain preventive protocols, quality metrics, and best practice recommendations. A written personalized care plan for preventive services as well as general preventive health recommendations were  provided to patient.     MAKAIYAH SCHWEIGER, LPN   [id number]   Nurse Notes: none  Due to this being a virtual visit, the after visit summary with patients personalized plan was offered to patient via mail or my-chart. to pick up at office at next visit

## 2022-07-10 ENCOUNTER — Other Ambulatory Visit: Payer: Self-pay | Admitting: Cardiology

## 2022-07-11 DIAGNOSIS — F331 Major depressive disorder, recurrent, moderate: Secondary | ICD-10-CM | POA: Diagnosis not present

## 2022-07-11 DIAGNOSIS — F419 Anxiety disorder, unspecified: Secondary | ICD-10-CM | POA: Diagnosis not present

## 2022-07-12 DIAGNOSIS — H43392 Other vitreous opacities, left eye: Secondary | ICD-10-CM | POA: Diagnosis not present

## 2022-07-12 DIAGNOSIS — H25813 Combined forms of age-related cataract, bilateral: Secondary | ICD-10-CM | POA: Diagnosis not present

## 2022-07-12 DIAGNOSIS — H10413 Chronic giant papillary conjunctivitis, bilateral: Secondary | ICD-10-CM | POA: Diagnosis not present

## 2022-07-12 DIAGNOSIS — H43812 Vitreous degeneration, left eye: Secondary | ICD-10-CM | POA: Diagnosis not present

## 2022-07-12 DIAGNOSIS — H40013 Open angle with borderline findings, low risk, bilateral: Secondary | ICD-10-CM | POA: Diagnosis not present

## 2022-08-08 ENCOUNTER — Other Ambulatory Visit: Payer: Self-pay | Admitting: Nurse Practitioner

## 2022-08-26 ENCOUNTER — Other Ambulatory Visit: Payer: Self-pay | Admitting: Cardiology

## 2022-09-11 DIAGNOSIS — H40013 Open angle with borderline findings, low risk, bilateral: Secondary | ICD-10-CM | POA: Diagnosis not present

## 2022-09-11 DIAGNOSIS — H43812 Vitreous degeneration, left eye: Secondary | ICD-10-CM | POA: Diagnosis not present

## 2022-09-11 DIAGNOSIS — H10413 Chronic giant papillary conjunctivitis, bilateral: Secondary | ICD-10-CM | POA: Diagnosis not present

## 2022-09-11 DIAGNOSIS — H43392 Other vitreous opacities, left eye: Secondary | ICD-10-CM | POA: Diagnosis not present

## 2022-09-11 DIAGNOSIS — H25813 Combined forms of age-related cataract, bilateral: Secondary | ICD-10-CM | POA: Diagnosis not present

## 2022-09-24 ENCOUNTER — Other Ambulatory Visit: Payer: Self-pay | Admitting: Nurse Practitioner

## 2022-09-28 DIAGNOSIS — H25812 Combined forms of age-related cataract, left eye: Secondary | ICD-10-CM | POA: Diagnosis not present

## 2022-10-09 ENCOUNTER — Ambulatory Visit
Admission: RE | Admit: 2022-10-09 | Discharge: 2022-10-09 | Disposition: A | Discharge status: Home / Self Care | Payer: 59 | Source: Ambulatory Visit | Attending: Nurse Practitioner | Admitting: Nurse Practitioner

## 2022-10-09 DIAGNOSIS — Z780 Asymptomatic menopausal state: Secondary | ICD-10-CM | POA: Diagnosis not present

## 2022-10-09 DIAGNOSIS — E2839 Other primary ovarian failure: Secondary | ICD-10-CM

## 2022-10-16 ENCOUNTER — Encounter: Payer: Self-pay | Admitting: Nurse Practitioner

## 2022-10-16 ENCOUNTER — Ambulatory Visit (INDEPENDENT_AMBULATORY_CARE_PROVIDER_SITE_OTHER): Payer: 59 | Admitting: Nurse Practitioner

## 2022-10-16 VITALS — BP 132/76 | HR 86 | Temp 98.6°F | Ht 65.0 in | Wt 211.0 lb

## 2022-10-16 DIAGNOSIS — Z860100 Personal history of colon polyps, unspecified: Secondary | ICD-10-CM | POA: Insufficient documentation

## 2022-10-16 DIAGNOSIS — R1013 Epigastric pain: Secondary | ICD-10-CM | POA: Insufficient documentation

## 2022-10-16 DIAGNOSIS — K5730 Diverticulosis of large intestine without perforation or abscess without bleeding: Secondary | ICD-10-CM | POA: Insufficient documentation

## 2022-10-16 DIAGNOSIS — I700 Atherosclerosis of aorta: Secondary | ICD-10-CM

## 2022-10-16 DIAGNOSIS — Q2733 Arteriovenous malformation of digestive system vessel: Secondary | ICD-10-CM | POA: Insufficient documentation

## 2022-10-16 DIAGNOSIS — K5900 Constipation, unspecified: Secondary | ICD-10-CM | POA: Insufficient documentation

## 2022-10-16 DIAGNOSIS — R140 Abdominal distension (gaseous): Secondary | ICD-10-CM | POA: Insufficient documentation

## 2022-10-16 DIAGNOSIS — I10 Essential (primary) hypertension: Secondary | ICD-10-CM

## 2022-10-16 DIAGNOSIS — Z6835 Body mass index (BMI) 35.0-35.9, adult: Secondary | ICD-10-CM

## 2022-10-16 DIAGNOSIS — K625 Hemorrhage of anus and rectum: Secondary | ICD-10-CM | POA: Insufficient documentation

## 2022-10-16 DIAGNOSIS — K219 Gastro-esophageal reflux disease without esophagitis: Secondary | ICD-10-CM | POA: Insufficient documentation

## 2022-10-16 DIAGNOSIS — R252 Cramp and spasm: Secondary | ICD-10-CM

## 2022-10-16 DIAGNOSIS — R1033 Periumbilical pain: Secondary | ICD-10-CM | POA: Insufficient documentation

## 2022-10-16 DIAGNOSIS — E782 Mixed hyperlipidemia: Secondary | ICD-10-CM

## 2022-10-16 MED ORDER — TRANDOLAPRIL 2 MG PO TABS: 2.0000 mg | ORAL_TABLET | Freq: Every day | ORAL | 1 refills | Status: DC

## 2022-10-16 MED ORDER — MAGNESIUM 250 MG PO TABS: 1.0000 | ORAL_TABLET | Freq: Every evening | ORAL | 1 refills | Status: DC

## 2022-10-16 NOTE — Progress Notes (Signed)
Barnet Glasgow Martin,acting as a Education administrator for [name], FNP.,have documented all relevant documentation on the behalf of [name], FNP,as directed by  [name], FNP while in the presence of [name], [place].    Subjective:     Patient ID: Peggy [name] , female    DOB: April 07, 1946 , 77 y.o.   MRN: [id number]   Chief Complaint  Patient presents with   Hypertension    HPI  Patient presents today for a bp and chol check. She has seen Cardiology - Dr. Ellyn Hack and her psychiatrist no changes. Patient has no other concerns today. Patient states she had eye 09/28/2022 and get intraocular lens.  BP Readings from Last 3 Encounters: 10/16/22 : 132/76 05/25/22 : 124/64 05/16/22 : 122/70    Hypertension This is a chronic problem. The problem is controlled. Pertinent negatives include no anxiety or chest pain. There are no associated agents to hypertension. Risk factors for coronary artery disease include sedentary lifestyle and obesity. Past treatments include diuretics. There are no compliance problems.  There is no history of angina. There is no history of chronic renal disease.  Hyperlipidemia This is a chronic problem. The problem is controlled. Recent lipid tests were reviewed and are normal. She has no history of chronic renal disease. There are no known factors aggravating her hyperlipidemia. Pertinent negatives include no chest pain. Risk factors for coronary artery disease include obesity and a sedentary lifestyle.     Past Medical History:  Diagnosis Date   Anxiety    Arthritis    "all over"   Asthma    Bladder cancer ([place])    Chest pain at rest 02/18/2015   Chronic bronchitis [facility])    'get it q yr"   Chronic lower back pain    Coronary artery disease, non-occlusive 2005   Cardiac catheter 2005 and 2012 for "anginal symptoms "showed angiographically normal coronary arteries. -->  Negative Myoview June 2016.  Coronary calcification noted on chest CT -> calcium score 131,  moderate proximal LAD and LCx disease on coronary CTA - (2019)   Daily headache    Deafness in left ear    Depression    DVT (deep venous thrombosis) ([place]) 2009/2010   "probably left leg"    GERD (gastroesophageal reflux disease)    History of kidney stones    Hyperlipidemia with target LDL less than 100    Hypertension    Leg swelling 12/12/2017   Nummular eczema    Sciatica 12/10/2012   Skin cancer    "right abdomen"     Family History  Problem Relation Age of Onset   Heart failure Mother    Cancer Mother    Hypertension Mother    Hypertension Father    Heart attack Father        Noted on cardiology intake form as "massive heart attack" at age 97   Heart failure Sister    Heart attack Sister        Not listed on her cardiology intake form   Breast cancer Sister    Heart failure Sister    Heart attack Sister        Not listed on her cardiology intake   Cancer Sister    Breast cancer Sister      Current Outpatient Medications:    acetaminophen (TYLENOL) 500 MG tablet, Take 1,000 mg by mouth 2 (two) times daily as needed for moderate pain or headache. , Disp: , Rfl:    amLODipine (NORVASC)  2.5 MG tablet, TAKE 1 TABLET(2.5 MG) BY MOUTH EVERY EVENING, Disp: 90 tablet, Rfl: 3   diazepam (VALIUM) 5 MG tablet, Take 5 mg by mouth every 6 (six) hours as needed., Disp: , Rfl:    diphenhydrAMINE (BENADRYL) 25 mg capsule, Take 25 mg by mouth daily as needed for allergies., Disp: , Rfl:    ezetimibe (ZETIA) 10 MG tablet, TAKE 1 TABLET(10 MG) BY MOUTH DAILY, Disp: 90 tablet, Rfl: 3   FLUoxetine (PROZAC) 40 MG capsule, Take 40 mg by mouth daily as needed (depression)., Disp: , Rfl:    furosemide (LASIX) 40 MG tablet, Take 1 tablet (40 mg total) by mouth daily., Disp: 90 tablet, Rfl: 3   Magnesium 250 MG TABS, Take 1 tablet (250 mg total) by mouth every evening. Take with evening meal, Disp: 90 tablet, Rfl: 1   nitroGLYCERIN (NITROSTAT) 0.4 MG SL tablet, Place 1 tablet (0.4 mg total)  under the tongue every 5 (five) minutes as needed for chest pain., Disp: 90 tablet, Rfl: 3   Potassium 99 MG TABS, Take 99 mg by mouth daily. Take 1 tablet by mouth daily., Disp: , Rfl:    simvastatin (ZOCOR) 80 MG tablet, TAKE 1 TABLET(80 MG) BY MOUTH AT BEDTIME, Disp: 90 tablet, Rfl: 1   valACYclovir (VALTREX) 1000 MG tablet, TAKE 1/2 TABLET BY MOUTH THREE TIMES DAILY AS NEEDED FOR OUTBREAK, Disp: 135 tablet, Rfl: 2   Vitamin D, Ergocalciferol, (DRISDOL) 1.25 MG (50000 UNIT) CAPS capsule, TAKE 1 CAPSULE BY MOUTH 2 TIMES A WEEK, Disp: 5 capsule, Rfl: 3   metoprolol succinate (TOPROL-XL) 25 MG 24 hr tablet, TAKE 1 TABLET(25 MG) BY MOUTH DAILY, Disp: 90 tablet, Rfl: 1   trandolapril (MAVIK) 2 MG tablet, Take 1 tablet (2 mg total) by mouth daily., Disp: 90 tablet, Rfl: 1   Allergies  Allergen Reactions   Penicillins Shortness Of Breath, Itching, Swelling, Rash and Other (See Comments)    Has patient had a PCN reaction causing immediate rash, facial/tongue/throat swelling, SOB or lightheadedness with hypotension: Yes Has patient had a PCN reaction causing severe rash involving mucus membranes or skin necrosis: No Has patient had a PCN reaction that required hospitalization: Yes Has patient had a PCN reaction occurring within the last 10 years: No If all of the above answers are "NO", then may proceed with Cephalosporin use.    Azithromycin Itching, Swelling and Rash   Levaquin [Levofloxacin In D5w] Hives, Itching, Swelling and Rash   Clindamycin/Lincomycin Hives   Erythromycin Base Itching   Keflex [Cephalexin] Itching and Rash   Sulfa Antibiotics Itching and Rash   Tetracyclines & Related Itching and Rash     Review of Systems  Constitutional: Negative.   HENT: Negative.    Eyes: Negative.   Respiratory: Negative.    Cardiovascular: Negative.  Negative for chest pain.  Gastrointestinal: Negative.      Today's Vitals   10/16/22 0950  BP: 132/76  Pulse: 86  Temp: 98.6 F (37 C)   TempSrc: Oral  Weight: 211 lb (95.7 kg)  Height: '5\' 5"'$  (1.651 m)  PainSc: 10-Worst pain ever  PainLoc: Knee   Body mass index is 35.11 kg/m.  Wt Readings from Last 3 Encounters:  10/16/22 211 lb (95.7 kg)  06/01/22 210 lb (95.3 kg)  05/25/22 212 lb (96.2 kg)    Objective:  Physical Exam Vitals reviewed.  Constitutional:      General: She is not in acute distress.    Appearance: Normal appearance. She is  obese.  Cardiovascular:     Rate and Rhythm: Normal rate and regular rhythm.     Pulses: Normal pulses.     Heart sounds: Normal heart sounds. No murmur heard. Pulmonary:     Effort: Pulmonary effort is normal. No respiratory distress.     Breath sounds: Normal breath sounds. No wheezing.  Musculoskeletal:     Right lower leg: Edema (trace) present.     Left lower leg: Edema (trace) present.  Skin:    General: Skin is warm and dry.     Capillary Refill: Capillary refill takes less than 2 seconds.  Neurological:     General: No focal deficit present.     Mental Status: She is alert and oriented to person, place, and time.     Cranial Nerves: No cranial nerve deficit.     Motor: No weakness.  Psychiatric:        Mood and Affect: Mood normal.        Behavior: Behavior normal.        Thought Content: Thought content normal.        Judgment: Judgment normal.         Assessment And Plan:     1. Essential hypertension Comments: Blood pressure is well-controlled.  Continue current medications - BMP8+eGFR - trandolapril (MAVIK) 2 MG tablet; Take 1 tablet (2 mg total) by mouth daily.  Dispense: 90 tablet; Refill: 1  2. Mixed hyperlipidemia Comments: Continue statin, tolerating well. - Lipid panel  3. Atherosclerosis of aorta (HCC) Comments: Continue statin, tolerating well.  4. Muscle cramps Comments: Will check her magnesium level and CK level advised to take magnesium in the evening and to stay with hydrated - CK, total - Magnesium 250 MG TABS; Take 1 tablet  (250 mg total) by mouth every evening. Take with evening meal  Dispense: 90 tablet; Refill: 1  5. Morbid obesity (HCC) Chronic Discussed healthy diet and regular exercise options  Encouraged to exercise at least 150 minutes per week with 2 days of strength training as tolerated  6. BMI 35.0-35.9,adult     Patient was given opportunity to ask questions. Patient verbalized understanding of the plan and was able to repeat key elements of the plan. All questions were answered to their satisfaction.  [name], FNP   I, [name], FNP, have reviewed all documentation for this visit. The documentation on 10/16/22 for the exam, diagnosis, procedures, and orders are all accurate and complete.   IF YOU HAVE BEEN REFERRED TO A SPECIALIST, IT MAY TAKE 1-2 WEEKS TO SCHEDULE/PROCESS THE REFERRAL. IF YOU HAVE NOT HEARD FROM US/SPECIALIST IN TWO WEEKS, PLEASE GIVE [place] A CALL AT [phone].   THE PATIENT IS ENCOURAGED TO PRACTICE SOCIAL DISTANCING DUE TO THE COVID-19 PANDEMIC.

## 2022-10-16 NOTE — Patient Instructions (Signed)
Hypertension, Adult ?Hypertension is another name for high blood pressure. High blood pressure forces your heart to work harder to pump blood. This can cause problems over time. ?There are two numbers in a blood pressure reading. There is a top number (systolic) over a bottom number (diastolic). It is best to have a blood pressure that is below 120/80. ?What are the causes? ?The cause of this condition is not known. Some other conditions can lead to high blood pressure. ?What increases the risk? ?Some lifestyle factors can make you more likely to develop high blood pressure: ?Smoking. ?Not getting enough exercise or physical activity. ?Being overweight. ?Having too much fat, sugar, calories, or salt (sodium) in your diet. ?Drinking too much alcohol. ?Other risk factors include: ?Having any of these conditions: ?Heart disease. ?Diabetes. ?High cholesterol. ?Kidney disease. ?Obstructive sleep apnea. ?Having a family history of high blood pressure and high cholesterol. ?Age. The risk increases with age. ?Stress. ?What are the signs or symptoms? ?High blood pressure may not cause symptoms. Very high blood pressure (hypertensive crisis) may cause: ?Headache. ?Fast or uneven heartbeats (palpitations). ?Shortness of breath. ?Nosebleed. ?Vomiting or feeling like you may vomit (nauseous). ?Changes in how you see. ?Very bad chest pain. ?Feeling dizzy. ?Seizures. ?How is this treated? ?This condition is treated by making healthy lifestyle changes, such as: ?Eating healthy foods. ?Exercising more. ?Drinking less alcohol. ?Your doctor may prescribe medicine if lifestyle changes do not help enough and if: ?Your top number is above 130. ?Your bottom number is above 80. ?Your personal target blood pressure may vary. ?Follow these instructions at home: ?Eating and drinking ? ?If told, follow the DASH eating plan. To follow this plan: ?Fill one half of your plate at each meal with fruits and vegetables. ?Fill one fourth of your plate  at each meal with whole grains. Whole grains include whole-wheat pasta, brown rice, and whole-grain bread. ?Eat or drink low-fat dairy products, such as skim milk or low-fat yogurt. ?Fill one fourth of your plate at each meal with low-fat (lean) proteins. Low-fat proteins include fish, chicken without skin, eggs, beans, and tofu. ?Avoid fatty meat, cured and processed meat, or chicken with skin. ?Avoid pre-made or processed food. ?Limit the amount of salt in your diet to less than 1,500 mg each day. ?Do not drink alcohol if: ?Your doctor tells you not to drink. ?You are pregnant, may be pregnant, or are planning to become pregnant. ?If you drink alcohol: ?Limit how much you have to: ?0-1 drink a day for women. ?0-2 drinks a day for men. ?Know how much alcohol is in your drink. In the [place] one drink equals one 12 oz bottle of beer (355 mL), one 5 oz glass of wine (148 mL), or one 1? oz glass of hard liquor (44 mL). ?Lifestyle ? ?Work with your doctor to stay at a healthy weight or to lose weight. Ask your doctor what the best weight is for you. ?Get at least 30 minutes of exercise that causes your heart to beat faster (aerobic exercise) most days of the week. This may include walking, swimming, or biking. ?Get at least 30 minutes of exercise that strengthens your muscles (resistance exercise) at least 3 days a week. This may include lifting weights or doing Pilates. ?Do not smoke or use any products that contain nicotine or tobacco. If you need help quitting, ask your doctor. ?Check your blood pressure at home as told by your doctor. ?Keep all follow-up visits. ?Medicines ?Take over-the-counter and prescription medicines   only as told by your doctor. Follow directions carefully. ?Do not skip doses of blood pressure medicine. The medicine does not work as well if you skip doses. Skipping doses also puts you at risk for problems. ?Ask your doctor about side effects or reactions to medicines that you should watch  for. ?Contact a doctor if: ?You think you are having a reaction to the medicine you are taking. ?You have headaches that keep coming back. ?You feel dizzy. ?You have swelling in your ankles. ?You have trouble with your vision. ?Get help right away if: ?You get a very bad headache. ?You start to feel mixed up (confused). ?You feel weak or numb. ?You feel faint. ?You have very bad pain in your: ?Chest. ?Belly (abdomen). ?You vomit more than once. ?You have trouble breathing. ?These symptoms may be an emergency. Get help right away. Call 911. ?Do not wait to see if the symptoms will go away. ?Do not drive yourself to the hospital. ?Summary ?Hypertension is another name for high blood pressure. ?High blood pressure forces your heart to work harder to pump blood. ?For most people, a normal blood pressure is less than 120/80. ?Making healthy choices can help lower blood pressure. If your blood pressure does not get lower with healthy choices, you may need to take medicine. ?This information is not intended to replace advice given to you by your health care provider. Make sure you discuss any questions you have with your health care provider. ?Document Revised: 06/09/2021 Document Reviewed: 06/09/2021 ?Elsevier Patient Education ? 2023 Else[employer] Inc[employer] ? ?

## 2022-10-17 LAB — BMP8+EGFR
BUN/Creatinine Ratio: 9 — ABNORMAL LOW (ref 12–28)
BUN: 12 mg/dL (ref 8–27)
CO2: 23 mmol/L (ref 20–29)
Calcium: 9.4 mg/dL (ref 8.7–10.3)
Chloride: 106 mmol/L (ref 96–106)
Creatinine, Ser: 1.31 mg/dL — ABNORMAL HIGH (ref 0.57–1.00)
Glucose: 81 mg/dL (ref 70–99)
Potassium: 4.7 mmol/L (ref 3.5–5.2)
Sodium: 147 mmol/L — ABNORMAL HIGH (ref 134–144)
eGFR: 41 mL/min/{1.73_m2} — ABNORMAL LOW (ref >59)

## 2022-10-17 LAB — LIPID PANEL
Chol/HDL Ratio: 2.9 ratio (ref 0.0–4.4)
Cholesterol, Total: 177 mg/dL (ref 100–199)
HDL: 61 mg/dL (ref >39)
LDL Chol Calc (NIH): 88 mg/dL (ref 0–99)
Triglycerides: 165 mg/dL — ABNORMAL HIGH (ref 0–149)
VLDL Cholesterol Cal: 28 mg/dL (ref 5–40)

## 2022-10-17 LAB — CK: Total CK: 181 U/L (ref 32–182)

## 2022-10-23 ENCOUNTER — Other Ambulatory Visit: Payer: Self-pay | Admitting: Cardiology

## 2022-11-06 ENCOUNTER — Ambulatory Visit: Payer: 59 | Admitting: Cardiology

## 2022-11-13 DIAGNOSIS — D492 Neoplasm of unspecified behavior of bone, soft tissue, and skin: Secondary | ICD-10-CM | POA: Diagnosis not present

## 2022-12-06 DIAGNOSIS — R140 Abdominal distension (gaseous): Secondary | ICD-10-CM | POA: Diagnosis not present

## 2022-12-06 DIAGNOSIS — K5901 Slow transit constipation: Secondary | ICD-10-CM | POA: Diagnosis not present

## 2022-12-06 DIAGNOSIS — K5520 Angiodysplasia of colon without hemorrhage: Secondary | ICD-10-CM | POA: Diagnosis not present

## 2022-12-06 DIAGNOSIS — Z86010 Personal history of colonic polyps: Secondary | ICD-10-CM | POA: Diagnosis not present

## 2023-02-05 ENCOUNTER — Encounter: Payer: Self-pay | Admitting: Nurse Practitioner

## 2023-02-05 ENCOUNTER — Ambulatory Visit (INDEPENDENT_AMBULATORY_CARE_PROVIDER_SITE_OTHER): Payer: 59 | Admitting: Nurse Practitioner

## 2023-02-05 VITALS — BP 118/64 | HR 98 | Temp 98.4°F | Ht 65.0 in | Wt 208.0 lb

## 2023-02-05 DIAGNOSIS — R519 Headache, unspecified: Secondary | ICD-10-CM | POA: Diagnosis not present

## 2023-02-05 DIAGNOSIS — M79605 Pain in left leg: Secondary | ICD-10-CM | POA: Diagnosis not present

## 2023-02-05 NOTE — Progress Notes (Signed)
Hershal Coria Martin,acting as a Neurosurgeon for Arnette Felts, FNP.,have documented all relevant documentation on the behalf of Arnette Felts, FNP,as directed by  Arnette Felts, FNP while in the presence of Arnette Felts, FNP.    Subjective:     Patient ID: Peggy [name] , female    DOB: 12/03/1945 , 77 y.o.   MRN: [id number]   No chief complaint on file.   HPI  Patient presents today for a headache on one side of her head for a month. She also has a bruise on the bottom of her left leg that she reports is painful as well. She has not been to the Orthopedic in the last 1-2 years for her knees.   She has taken her blood pressure medications today at 730 am. She ate macaroni salad and baked fish yesterday.   BP Readings from Last 3 Encounters: 02/05/23 : (!) 150/80 10/16/22 : 132/76 05/25/22 : 124/64       Past Medical History:  Diagnosis Date   Anxiety    Arthritis    "all over"   Asthma    Bladder cancer ([facility])    Chest pain at rest 02/18/2015   Chronic bronchitis ([facility])    'get it q yr"   Chronic lower back pain    Coronary artery disease, non-occlusive 2005   Cardiac catheter 2005 and 2012 for "anginal symptoms "showed angiographically normal coronary arteries. -->  Negative Myoview June 2016.  Coronary calcification noted on chest CT -> calcium score 131, moderate proximal LAD and LCx disease on coronary CTA - (2019)   Daily headache    Deafness in left ear    Depression    DVT (deep venous thrombosis) ([facility]) 2009/2010   "probably left leg"    GERD (gastroesophageal reflux disease)    History of kidney stones    Hyperlipidemia with target LDL less than 100    Hypertension    Leg swelling 12/12/2017   Nummular eczema    Sciatica 12/10/2012   Skin cancer    "right abdomen"     Family History  Problem Relation Age of Onset   Heart failure Mother    Cancer Mother    Hypertension Mother    Hypertension Father    Heart attack Father        Noted on cardiology intake form as  "massive heart attack" at age 54   Heart failure Sister    Heart attack Sister        Not listed on her cardiology intake form   Breast cancer Sister    Heart failure Sister    Heart attack Sister        Not listed on her cardiology intake   Cancer Sister    Breast cancer Sister      Current Outpatient Medications:    acetaminophen (TYLENOL) 500 MG tablet, Take 1,000 mg by mouth 2 (two) times daily as needed for moderate pain or headache. , Disp: , Rfl:    amLODipine (NORVASC) 2.5 MG tablet, TAKE 1 TABLET(2.5 MG) BY MOUTH EVERY EVENING, Disp: 90 tablet, Rfl: 3   diazepam (VALIUM) 5 MG tablet, Take 5 mg by mouth every 6 (six) hours as needed., Disp: , Rfl:    diphenhydrAMINE (BENADRYL) 25 mg capsule, Take 25 mg by mouth daily as needed for allergies., Disp: , Rfl:    ezetimibe (ZETIA) 10 MG tablet, TAKE 1 TABLET(10 MG) BY MOUTH DAILY, Disp: 90 tablet, Rfl: 3   FLUoxetine (PROZAC) 40 MG  capsule, Take 40 mg by mouth daily as needed (depression)., Disp: , Rfl:    furosemide (LASIX) 40 MG tablet, Take 1 tablet (40 mg total) by mouth daily., Disp: 90 tablet, Rfl: 3   Magnesium 250 MG TABS, Take 1 tablet (250 mg total) by mouth every evening. Take with evening meal, Disp: 90 tablet, Rfl: 1   metoprolol succinate (TOPROL-XL) 25 MG 24 hr tablet, TAKE 1 TABLET(25 MG) BY MOUTH DAILY, Disp: 90 tablet, Rfl: 1   nitroGLYCERIN (NITROSTAT) 0.4 MG SL tablet, Place 1 tablet (0.4 mg total) under the tongue every 5 (five) minutes as needed for chest pain., Disp: 90 tablet, Rfl: 3   Potassium 99 MG TABS, Take 99 mg by mouth daily. Take 1 tablet by mouth daily., Disp: , Rfl:    simvastatin (ZOCOR) 80 MG tablet, TAKE 1 TABLET(80 MG) BY MOUTH AT BEDTIME, Disp: 90 tablet, Rfl: 1   trandolapril (MAVIK) 2 MG tablet, Take 1 tablet (2 mg total) by mouth daily., Disp: 90 tablet, Rfl: 1   valACYclovir (VALTREX) 1000 MG tablet, TAKE 1/2 TABLET BY MOUTH THREE TIMES DAILY AS NEEDED FOR OUTBREAK, Disp: 135 tablet, Rfl: 2    Vitamin D, Ergocalciferol, (DRISDOL) 1.25 MG (50000 UNIT) CAPS capsule, TAKE 1 CAPSULE BY MOUTH 2 TIMES A WEEK, Disp: 5 capsule, Rfl: 3   Allergies  Allergen Reactions   Penicillins Shortness Of Breath, Itching, Swelling, Rash and Other (See Comments)    Has patient had a PCN reaction causing immediate rash, facial/tongue/throat swelling, SOB or lightheadedness with hypotension: Yes Has patient had a PCN reaction causing severe rash involving mucus membranes or skin necrosis: No Has patient had a PCN reaction that required hospitalization: Yes Has patient had a PCN reaction occurring within the last 10 years: No If all of the above answers are "NO", then may proceed with Cephalosporin use.    Azithromycin Itching, Swelling and Rash   Levaquin [Levofloxacin In D5w] Hives, Itching, Swelling and Rash   Clindamycin/Lincomycin Hives   Erythromycin Base Itching   Keflex [Cephalexin] Itching and Rash   Sulfa Antibiotics Itching and Rash   Tetracyclines & Related Itching and Rash     Review of Systems  Constitutional: Negative.   Eyes:  Positive for visual disturbance (right side; sometimes she can not see).  Respiratory: Negative.    Cardiovascular: Negative.   Neurological:  Positive for light-headedness and headaches (right side of headache for one month, sharp and constant). Negative for weakness and numbness.       She is having to use a walker and cane due to losing her balance.   Psychiatric/Behavioral: Negative.       Today's Vitals   02/05/23 1028 02/05/23 1129 02/05/23 1130 02/05/23 1131  BP: (!) 150/80 124/80 120/60 118/64  Pulse: 80 74 70 98  Temp: 98.4 F (36.9 C)     TempSrc: Oral     Weight: 208 lb (94.3 kg)     Height: 5\' 5"  (1.651 m)     PainSc: 10-Worst pain ever     PainLoc: Leg      Body mass index is 34.61 kg/m.  Wt Readings from Last 3 Encounters:  02/05/23 208 lb (94.3 kg)  10/16/22 211 lb (95.7 kg)  06/01/22 210 lb (95.3 kg)     Objective:  Physical  Exam Vitals reviewed.  Constitutional:      General: She is not in acute distress.    Appearance: Normal appearance.  Cardiovascular:     Rate and  Rhythm: Normal rate.     Pulses: Normal pulses.     Heart sounds: Normal heart sounds. No murmur heard. Pulmonary:     Effort: Pulmonary effort is normal. No respiratory distress.     Breath sounds: Normal breath sounds. No wheezing.  Skin:    General: Skin is warm and dry.     Capillary Refill: Capillary refill takes less than 2 seconds.  Neurological:     General: No focal deficit present.     Mental Status: She is alert and oriented to person, place, and time.     Cranial Nerves: No cranial nerve deficit.     Motor: No weakness.         Assessment And Plan:     1. Right-sided headache Comments: Will check CT of head since this has been persistent and advised in future to go to ER for evaluation. New onset headache. - CT HEAD WO CONTRAST ( ); Future  2. Left leg pain Comments: lower leg calf area with tenderness, appears to be slightl bruising/discoloration. Advised to wear support socks.    Return for keep next appt. .  Patient was given opportunity to ask questions. Patient verbalized understanding of the plan and was able to repeat key elements of the plan. All questions were answered to their satisfaction.  Arnette Felts, FNP   I, Arnette Felts, FNP, have reviewed all documentation for this visit. The documentation on 02/01/23 for the exam, diagnosis, procedures, and orders are all accurate and complete.   IF YOU HAVE BEEN REFERRED TO A SPECIALIST, IT MAY TAKE 1-2 WEEKS TO SCHEDULE/PROCESS THE REFERRAL. IF YOU HAVE NOT HEARD FROM US/SPECIALIST IN TWO WEEKS, PLEASE GIVE [place] A CALL AT [phone].   THE PATIENT IS ENCOURAGED TO PRACTICE SOCIAL DISTANCING DUE TO THE COVID-19 PANDEMIC.

## 2023-02-05 NOTE — Patient Instructions (Addendum)
General Headache Without Cause A headache is pain or discomfort you feel around the head or neck area. There are many causes and types of headaches. In some cases, the cause may not be found. Follow these instructions at home: Watch your condition for any changes. Let your doctor know about them. Take these steps to help with your condition: Managing pain     Take over-the-counter and prescription medicines only as told by your doctor. This includes medicines for pain that are taken by mouth or put on the skin. Lie down in a dark, quiet room when you have a headache. If told, put ice on your head and neck area: Put ice in a plastic bag. Place a towel between your skin and the bag. Leave the ice on for 20 minutes, 2-3 times per day. Take off the ice if your skin turns bright red. This is very important. If you cannot feel pain, heat, or cold, you have a greater risk of damage to the area. If told, put heat on the affected area. Use the heat source that your doctor recommends, such as a moist heat pack or a heating pad. Place a towel between your skin and the heat source. Leave the heat on for 20-30 minutes. Take off the heat if your skin turns bright red. This is very important. If you cannot feel pain, heat, or cold, you have a greater risk of getting burned. Keep lights dim if bright lights bother you or make your headaches worse. Eating and drinking Eat meals on a regular schedule. If you drink alcohol: Limit how much you have to: 0-1 drink a day for women who are not pregnant. 0-2 drinks a day for men. Know how much alcohol is in a drink. In the [place] one drink equals one 12 oz bottle of beer (355 mL), one 5 oz glass of wine (148 mL), or one 1 oz glass of hard liquor (44 mL). Stop drinking caffeine, or drink less caffeine. General instructions  Keep a journal to find out if certain things bring on headaches. For example, write down: What you eat and drink. How much sleep you  get. Any change to your diet or medicines. Get a massage or try other ways to relax. Limit stress. Sit up straight. Do not tighten (tense) your muscles. Do not smoke or use any products that contain nicotine or tobacco. If you need help quitting, ask your doctor. Exercise regularly as told by your doctor. Get enough sleep. This often means 7-9 hours of sleep each night. Keep all follow-up visits. This is important. Contact a doctor if: Medicine does not help your symptoms. You have a headache that feels different than the other headaches. You feel like you may vomit (nauseous) or you vomit. You have a fever. Get help right away if: Your headache: Gets very bad quickly. Gets worse after a lot of physical activity. You have any of these symptoms: You continue to vomit. A stiff neck. Trouble seeing. Your eye or ear hurts. Trouble speaking. Weak muscles or you lose muscle control. You lose your balance or have trouble walking. You feel like you will pass out (faint) or you pass out. You are mixed up (confused). You have a seizure. These symptoms may be an emergency. Get help right away. Call your local emergency services (911 in the [place] Do not wait to see if the symptoms will go away. Do not drive yourself to the hospital. Summary A headache is pain or discomfort that  is felt around the head or neck area. There are many causes and types of headaches. In some cases, the cause may not be found. Keep a journal to help find out what causes your headaches. Watch your condition for any changes. Let your doctor know about them. Contact a doctor if you have a headache that is different from usual, or if medicine does not help your headache. Get help right away if your headache gets very bad, you throw up, you have trouble seeing, you lose your balance, or you have a seizure. This information is not intended to replace advice given to you by your health care provider. Make sure you  discuss any questions you have with your health care provider. Document Revised: 01/19/2021 Document Reviewed: 01/19/2021 Elsevier Patient Education  2024 [employer]  Advised to go to ER if symptoms worsen to include nausea, vomiting, worsening visual changes, headache worsens or loss of consciousness.

## 2023-02-09 ENCOUNTER — Ambulatory Visit
Admission: RE | Admit: 2023-02-09 | Discharge: 2023-02-09 | Disposition: A | Discharge status: Home / Self Care | Payer: 59 | Source: Ambulatory Visit | Attending: Nurse Practitioner | Admitting: Nurse Practitioner

## 2023-02-09 DIAGNOSIS — R519 Headache, unspecified: Secondary | ICD-10-CM | POA: Diagnosis not present

## 2023-02-09 DIAGNOSIS — R42 Dizziness and giddiness: Secondary | ICD-10-CM | POA: Diagnosis not present

## 2023-02-15 ENCOUNTER — Other Ambulatory Visit: Payer: Self-pay | Admitting: Nurse Practitioner

## 2023-02-15 DIAGNOSIS — R519 Headache, unspecified: Secondary | ICD-10-CM

## 2023-03-18 ENCOUNTER — Other Ambulatory Visit: Payer: Self-pay | Admitting: Nurse Practitioner

## 2023-03-27 ENCOUNTER — Other Ambulatory Visit: Payer: Self-pay | Admitting: Nurse Practitioner

## 2023-04-16 ENCOUNTER — Ambulatory Visit: Payer: 59 | Admitting: Nurse Practitioner

## 2023-04-17 ENCOUNTER — Other Ambulatory Visit: Payer: Self-pay | Admitting: Nurse Practitioner

## 2023-04-17 ENCOUNTER — Other Ambulatory Visit: Payer: Self-pay | Admitting: Cardiology

## 2023-04-17 ENCOUNTER — Encounter: Payer: Medicare HMO | Admitting: Nurse Practitioner

## 2023-04-17 DIAGNOSIS — I10 Essential (primary) hypertension: Secondary | ICD-10-CM

## 2023-05-17 ENCOUNTER — Other Ambulatory Visit: Payer: Self-pay | Admitting: Cardiology

## 2023-05-21 ENCOUNTER — Telehealth: Payer: Self-pay | Admitting: Psychiatry

## 2023-05-21 ENCOUNTER — Ambulatory Visit: Payer: 59 | Admitting: Psychiatry

## 2023-05-21 NOTE — Telephone Encounter (Signed)
LVM and sent text informing pt of need to reschedule 05/21/23 appt - MD out

## 2023-05-30 ENCOUNTER — Telehealth: Payer: Self-pay | Admitting: Psychiatry

## 2023-05-30 ENCOUNTER — Ambulatory Visit (INDEPENDENT_AMBULATORY_CARE_PROVIDER_SITE_OTHER): Payer: 59 | Admitting: Psychiatry

## 2023-05-30 ENCOUNTER — Encounter: Payer: Self-pay | Admitting: Psychiatry

## 2023-05-30 VITALS — BP 148/73 | HR 78 | Ht 65.0 in | Wt 210.8 lb

## 2023-05-30 DIAGNOSIS — R519 Headache, unspecified: Secondary | ICD-10-CM | POA: Diagnosis not present

## 2023-05-30 DIAGNOSIS — G43709 Chronic migraine without aura, not intractable, without status migrainosus: Secondary | ICD-10-CM

## 2023-05-30 DIAGNOSIS — M542 Cervicalgia: Secondary | ICD-10-CM | POA: Diagnosis not present

## 2023-05-30 NOTE — Telephone Encounter (Signed)
[employer] medicare/[employer] medicaid NPR sent to [facility] [phone]

## 2023-05-30 NOTE — Progress Notes (Signed)
Referring:  Arnette Felts, FNP [address] [place],  [place] [address]  PCP: Arnette Felts, FNP  Neurology was asked to evaluate Peggy [name], a 77 year old female for a chief complaint of headaches.  Our recommendations of care will be communicated by shared medical record.    CC:  headaches  History provided from self  HPI:  Medical co-morbidities: asthma, bladder cancer, CAD, DVT 2009, HLD, HTN, PVST  The patient presents for evaluation of headaches which began in May 2024. States she had headaches when she was younger but otherwise had not had significant headaches until recently. Headache is described as constant pain on both sides, right>left. They are associated with blurry vision, double vision, photophobia, phonophobia, and nausea. They will sometimes get so severe that she needs to lie down. She tried Nurtec samples which helped temporarily but then stopped working. She has also taken Tylenol but this didn't help.  Has chronic neck pain which will sometimes radiate down her arm. C-spine X-ray in 2020 showed multilevel degenerative changes. She also has a "knot" above her forehead which is sore sometimes. She is unsure what this is.  [facility] 02/09/23 showed mild generalized atrophy and no acute intracranial process. It also showed chronic cortical densities on the frontal region of the skull. This correlates with the are of the "knot" on her forehead.  Headache History: Onset: May 2024 Triggers: no Aura: blurry vision Location: right>left Quality/Description: Associated Symptoms:  Photophobia: yes  Phonophobia: yes  Nausea: sometimes Worse with activity?: yes Duration of headaches: constant  Headache days per month: 30 Headache free days per month: 0  Current Treatment: Abortive none  Preventative none  Prior Therapies                                 Amlodipine 2.5 mg daily Metoprolol 25 mg daily Trandalopril 2 mg daily Fluoxetine 40 mg  daily   LABS: CBC    Component Value Date/Time   WBC 9.5 04/12/2022 0938   WBC 9.2 02/12/2020 0926   RBC 3.91 04/12/2022 0938   RBC 3.82 (L) 02/12/2020 0926   HGB 11.6 04/12/2022 0938   HCT 36.8 04/12/2022 0938   PLT 319 04/12/2022 0938   MCV 94 04/12/2022 0938   MCH 29.7 04/12/2022 0938   MCH 27.7 02/12/2020 0926   MCHC 31.5 04/12/2022 0938   MCHC 30.3 02/12/2020 0926   RDW 13.1 04/12/2022 0938   LYMPHSABS 2.3 12/25/2015 2130   MONOABS 0.6 12/25/2015 2130   EOSABS 0.4 12/25/2015 2130   BASOSABS 0.0 12/25/2015 2130      Latest Ref Rng & Units 10/16/2022   10:34 AM 04/07/2022    3:04 PM 08/09/2021   10:31 AM  CMP  Glucose 70 - 99 mg/dL 81  79  86   BUN 8 - 27 mg/dL 12  11  8    Creatinine 0.57 - 1.00 mg/dL 6.44  0.34  7.42   Sodium 134 - 144 mmol/L 147  143  145   Potassium 3.5 - 5.2 mmol/L 4.7  4.0  4.4   Chloride 96 - 106 mmol/L 106  104  105   CO2 20 - 29 mmol/L 23  26  27    Calcium 8.7 - 10.3 mg/dL 9.4  8.9  9.3   Total Protein 6.0 - 8.5 g/dL   7.1   Total Bilirubin 0.0 - 1.2 mg/dL   0.6   Alkaline  Phos 44 - 121 IU/L   142   AST 0 - 40 IU/L   21   ALT 0 - 32 IU/L   12      IMAGING:  [facility] 02/09/23: 1. Generalized cerebral atrophy. 2. No acute intracranial abnormality.  Imaging independently reviewed on May 30, 2023   Current Outpatient Medications on File Prior to Visit  Medication Sig Dispense Refill   acetaminophen (TYLENOL) 500 MG tablet Take 1,000 mg by mouth 2 (two) times daily as needed for moderate pain or headache.      amLODipine (NORVASC) 2.5 MG tablet TAKE 1 TABLET(2.5 MG) BY MOUTH EVERY EVENING 90 tablet 3   ciprofloxacin (CIPRO) 500 MG tablet TAKE 1 TABLET BY MOUTH TWICE DAILY FOR 3 DAYS FOR DENTAL PROCEDURES 30 tablet 1   diazepam (VALIUM) 5 MG tablet Take 5 mg by mouth every 6 (six) hours as needed.     diphenhydrAMINE (BENADRYL) 25 mg capsule Take 25 mg by mouth daily as needed for allergies.     ezetimibe (ZETIA) 10 MG tablet TAKE 1  TABLET(10 MG) BY MOUTH DAILY 90 tablet 3   FLUoxetine (PROZAC) 40 MG capsule Take 40 mg by mouth daily as needed (depression).     metoprolol succinate (TOPROL-XL) 25 MG 24 hr tablet TAKE 1 TABLET(25 MG) BY MOUTH DAILY 90 tablet 0   simvastatin (ZOCOR) 80 MG tablet TAKE 1 TABLET(80 MG) BY MOUTH AT BEDTIME 90 tablet 1   trandolapril (MAVIK) 2 MG tablet TAKE 1 TABLET(2 MG) BY MOUTH DAILY 90 tablet 1   valACYclovir (VALTREX) 1000 MG tablet TAKE 1/2 TABLET BY MOUTH THREE TIMES DAILY AS NEEDED FOR OUTBREAK 135 tablet 2   Vitamin D, Ergocalciferol, (DRISDOL) 1.25 MG (50000 UNIT) CAPS capsule TAKE 1 CAPSULE BY MOUTH 2 TIMES A WEEK 5 capsule 3   furosemide (LASIX) 40 MG tablet Take 1 tablet (40 mg total) by mouth daily. 90 tablet 3   Magnesium 250 MG TABS Take 1 tablet (250 mg total) by mouth every evening. Take with evening meal (Patient not taking: Reported on 05/30/2023) 90 tablet 1   nitroGLYCERIN (NITROSTAT) 0.4 MG SL tablet Place 1 tablet (0.4 mg total) under the tongue every 5 (five) minutes as needed for chest pain. 90 tablet 3   Potassium 99 MG TABS Take 99 mg by mouth daily. Take 1 tablet by mouth daily. (Patient not taking: Reported on 05/30/2023)     No current facility-administered medications on file prior to visit.     Allergies: Allergies  Allergen Reactions   Penicillins Shortness Of Breath, Itching, Swelling, Rash and Other (See Comments)    Has patient had a PCN reaction causing immediate rash, facial/tongue/throat swelling, SOB or lightheadedness with hypotension: Yes Has patient had a PCN reaction causing severe rash involving mucus membranes or skin necrosis: No Has patient had a PCN reaction that required hospitalization: Yes Has patient had a PCN reaction occurring within the last 10 years: No If all of the above answers are "NO", then may proceed with Cephalosporin use.    Azithromycin Itching, Swelling and Rash   Levaquin [Levofloxacin In D5w] Hives, Itching, Swelling and  Rash   Clindamycin/Lincomycin Hives   Erythromycin Base Itching   Keflex [Cephalexin] Itching and Rash   Sulfa Antibiotics Itching and Rash   Tetracyclines & Related Itching and Rash    Family History: Family History  Problem Relation Age of Onset   Heart failure Mother    Cancer Mother    Hypertension Mother  Hypertension Father    Heart attack Father        Noted on cardiology intake form as "massive heart attack" at age 34   Heart failure Sister    Heart attack Sister        Not listed on her cardiology intake form   Breast cancer Sister    Heart failure Sister    Heart attack Sister        Not listed on her cardiology intake   Cancer Sister    Breast cancer Sister      Past Medical History: Past Medical History:  Diagnosis Date   Anxiety    Arthritis    "all over"   Asthma    Bladder cancer (HCC)    Chest pain at rest 02/18/2015   Chronic bronchitis (HCC)    'get it q yr"   Chronic lower back pain    Coronary artery disease, non-occlusive 2005   Cardiac catheter 2005 and 2012 for "anginal symptoms "showed angiographically normal coronary arteries. -->  Negative Myoview June 2016.  Coronary calcification noted on chest CT -> calcium score 131, moderate proximal LAD and LCx disease on coronary CTA - (2019)   Daily headache    Deafness in left ear    Depression    DVT (deep venous thrombosis) (HCC) 2009/2010   "probably left leg"    GERD (gastroesophageal reflux disease)    History of kidney stones    Hyperlipidemia with target LDL less than 100    Hypertension    Leg swelling 12/12/2017   Nummular eczema    Sciatica 12/10/2012   Skin cancer    "right abdomen"    Past Surgical History Past Surgical History:  Procedure Laterality Date   ABDOMINAL HYSTERECTOMY  1980's   APPENDECTOMY Right    BACK SURGERY     x 2, lower   BLADDER TUMOR EXCISION     BREAST BIOPSY Right    BREAST EXCISIONAL BIOPSY Right    CAROTID ARTERY DOPPLERS   10/2017   Carotid  Dopplers: 40-59% stenosis on the Right Internal Carotid, < 40% stenosis in the Left.  Both vertebral arteries show normal flow.  Left subclavian artery is normal, but there is some flow disturbance in the right subclavian artery.   COLONOSCOPY WITH PROPOFOL N/A 02/26/2015   Procedure: COLONOSCOPY WITH PROPOFOL;  Surgeon: Jeani Hawking, MD;  Location: [facility];  Service: Endoscopy;  Laterality: N/A;   COLONOSCOPY WITH PROPOFOL N/A 12/06/2017   Procedure: COLONOSCOPY WITH PROPOFOL;  Surgeon: Charna Elizabeth, MD;  Location: [facility];  Service: Endoscopy;  Laterality: N/A;   CORONARY CALCIUM SCORE & CT ANGIOGRAM  11/2017   Coronary calcium score 131 (moderate risk).  Moderate proximal LAD & LCx stenosis --> LOW RISK STUDY.  No evidence to suspect cardiac related chest pain --> correlates with cardiac catheterizations   ENTEROSCOPY N/A 12/14/2017   Procedure: ENTEROSCOPY;  Surgeon: Jeani Hawking, MD;  Location: [facility];  Service: Endoscopy;  Laterality: N/A;   ESOPHAGOGASTRODUODENOSCOPY Left 02/20/2015   Procedure: ESOPHAGOGASTRODUODENOSCOPY (EGD);  Surgeon: Rachael Fee, MD;  Location: [facility] END[facility]COPY;  Service: Endoscopy;  Laterality: Left;   ESOPHAGOGASTRODUODENOSCOPY (EGD) WITH PROPOFOL N/A 12/06/2017   Procedure: ESOPHAGOGASTRODUODENOSCOPY (EGD) WITH PROPOFOL;  Surgeon: Charna Elizabeth, MD;  Location: [facility];  Service: Endoscopy;  Laterality: N/A;   HOT HEMOSTASIS N/A 12/14/2017   Procedure: HOT HEMOSTASIS (ARGON PLASMA COAGULATION/BICAP);  Surgeon: Jeani Hawking, MD;  Location: [facility];  Service: Endoscopy;  Laterality: N/A;  JOINT REPLACEMENT     KNEE ARTHROSCOPY Right ~ 2000   LEFT HEART CATH AND CORONARY ANGIOGRAPHY  11/'05; 2/'12   Angiographically normal coronary arteries   LUMBAR DISC SURGERY  1980's X 1; ~ 2002   MEDIAL PARTIAL KNEE REPLACEMENT Right 2001   NM MYOVIEW LTD  02/2015   EF 65%.  No reversible ischemia.  Possible scar along the cardiac apex and anteroseptal  wall.  (Mild severity, medium-sized defect in the apex anteroseptal wall noted on both resting and stress images).  No inducible ischemia.  Read as LOW RISK   NM MYOVIEW LTD  12/09/2020   LOW RISK.  EF 55 to 65%.  No ischemia or infarct.   PARTIAL KNEE ARTHROPLASTY Left 2009   REPLACEMENT TOTAL KNEE BILATERAL Bilateral    REVISION TOTAL KNEE ARTHROPLASTY Left    SKIN CANCER EXCISION Right    "abdomen"   TRANSTHORACIC ECHOCARDIOGRAM  06/'16; 2/'19; 1/'21    A) mod Conc LVH. EF 60-65%.  No RWMA. GR 1 D. Mild Ao Stenosis. Mild LA dilation.;  B) EF 60-65%.  Mild LVH.  No or W MA.  No significant valve disease.;l 01/2020: Normal LV systolic function.  Mild LVH.  GR 1 DD.  EF 60 to 65%.  No R WMA.  Trace AI.  No AS    Social History: Social History   Tobacco Use   Smoking status: Never   Smokeless tobacco: Never  Vaping Use   Vaping status: Never Used  Substance Use Topics   Alcohol use: No   Drug use: No    ROS: Negative for fevers, chills. Positive for headaches. All other systems reviewed and negative unless stated otherwise in HPI.   Physical Exam:   Vital Signs: BP (!) 148/73 (BP Location: Left Arm, Patient Position: Sitting, Cuff Size: Large)   Pulse 78   Ht 5\' 5"  (1.651 m)   Wt 210 lb 12.8 oz (95.6 kg)   BMI 35.08 kg/m  GENERAL: well appearing,in no acute distress,alert SKIN:  Color, texture, turgor normal. No rashes or lesions HEAD:  Normocephalic/atraumatic. CV:  RRR RESP: Normal respiratory effort MSK: +tenderness to palpation over occiput, neck, and shoulders. Limited range of motion turning head to the left and right  NEUROLOGICAL: Mental Status: Alert, oriented to person, place and time,Follows commands Cranial Nerves: PERRL, visual fields intact to confrontation, slightly dysconjugate gaze with mild exotropia of the left eye, decreased sensation over left V1, no facial droop or ptosis, hearing grossly intact, no dysarthria, palate elevate symmetrically, tongue  protrudes midline, shoulder shrug intact and symmetric Motor: left hip flexion 4/5, otherwise muscle strength 5/5 both upper and lower extremities Reflexes: 2+ throughout Sensation: decreased sensation to light touch over left leg, otherwise intact to light touch all 4 extremities Coordination: Finger-to- nose-finger intact bilaterally Gait: antalgic gait, walks with a cane   IMPRESSION: 77 year old female with a history of asthma, bladder cancer, CAD, DVT 2009, HLD, HTN, PVST who presents for evaluation of new daily headaches over the past 3 months. CTH with mild generalized atrophy and otherwise unremarkable. Exam with a slightly dysconjugate gaze and decreased sensation over her left forehead. She also has weakness and decreased sensation in her left leg, however she does note a history of sciatica on that side. Will order brain MRI and check blood work for inflammatory markers. She would prefer not to start a daily preventive at this time. Ubrelvy sample provided as her headaches do have migrainous features including photophobia, phonophobia,  and nausea. She has neck tenderness and limited range of motion on exam which is also likely contributing to her headaches. Referral to neck PT placed  PLAN: -ESR, CRP -MRI brain -Ubrelvy sample provided, will send in rx if this is effective -Referral to neck PT -Next steps: consider gabapentin, zonisamide for headache prevention  I spent a total of 35 minutes chart reviewing and counseling the patient. Headache education was done. Discussed treatment options including preventive and acute medications. Discussed medication side effects, adverse reactions and drug interactions. Written educational materials and patient instructions outlining all of the above were given.  Follow-up: 6 months   Ocie Doyne, MD 05/30/2023   9:30 AM

## 2023-05-31 LAB — SEDIMENTATION RATE: Sed Rate: 16 mm/hr (ref 0–40)

## 2023-05-31 LAB — C-REACTIVE PROTEIN: CRP: 2 mg/L (ref 0–10)

## 2023-06-12 ENCOUNTER — Other Ambulatory Visit: Payer: 59

## 2023-06-14 ENCOUNTER — Other Ambulatory Visit: Payer: 59

## 2023-06-18 ENCOUNTER — Encounter: Payer: Self-pay | Admitting: Cardiology

## 2023-06-18 ENCOUNTER — Ambulatory Visit: Payer: 59 | Attending: Cardiology | Admitting: Cardiology

## 2023-06-18 VITALS — BP 136/77 | HR 78 | Ht 65.0 in | Wt 210.4 lb

## 2023-06-18 DIAGNOSIS — E785 Hyperlipidemia, unspecified: Secondary | ICD-10-CM

## 2023-06-18 DIAGNOSIS — I10 Essential (primary) hypertension: Secondary | ICD-10-CM

## 2023-06-18 DIAGNOSIS — I471 Supraventricular tachycardia, unspecified: Secondary | ICD-10-CM

## 2023-06-18 DIAGNOSIS — R600 Localized edema: Secondary | ICD-10-CM | POA: Diagnosis not present

## 2023-06-18 DIAGNOSIS — I2510 Atherosclerotic heart disease of native coronary artery without angina pectoris: Secondary | ICD-10-CM

## 2023-06-18 DIAGNOSIS — I700 Atherosclerosis of aorta: Secondary | ICD-10-CM | POA: Diagnosis not present

## 2023-06-18 DIAGNOSIS — R0601 Orthopnea: Secondary | ICD-10-CM

## 2023-06-18 DIAGNOSIS — Z0000 Encounter for general adult medical examination without abnormal findings: Secondary | ICD-10-CM

## 2023-06-18 NOTE — Progress Notes (Signed)
Cardiology Office Note:  .   Date:  06/24/2023  ID:  Peggy [name], DOB [birth year], MRN [id number] PCP: Arnette Felts, FNP  [facility] Providers Cardiologist:  Bryan Lemma, MD     Chief Complaint  Patient presents with   Follow-up    Annual - doing well   Coronary Artery Disease    Non-obstructive by Cor CTA -- NO CP    Patient Profile: .     Peggy [name] is an obese 77 y.o. female with a PMH notable for HTN, nonobstructive CAD (nonobstructive disease By Cath in 2005 in 2012 and Coronary CTA with moderate disease, nonischemic Myoview April 2022), PSVT, DDD, HLD, MSK related chest pain who presents here for annual follow-up at the request of Arnette Felts, FNP.   Peggy [name] was last seen on 05/25/2022 by Edd Fabian, NP for what sounded like left-sided musculoskeletal chest pain-having been seen by Joni Reining, NP in August 2023.  Pain lasted maybe 1 or 2 minutes.  Not exertional.  Reproducible on exam.  Recommended following up with PCP and using Voltaren gel.  No ischemic evaluation.  BP stable.  Recommended 24-month follow-up.  Subjective  Discussed the use of AI scribe software for clinical note transcription with the patient, who gave verbal consent to proceed.  History of Present Illness   The patient, with a history of coronary artery disease, heart murmur, and fluid overload, presents with intermittent chest discomfort, which has improved with topical gel application. She denies any exacerbation with movement or exercise. She also reports occasional palpitations, lasting up to a minute, but denies associated dizziness or lightheadedness.  The patient sleeps with her head elevated due to a sensation of suffocation when lying flat. She occasionally wakes up short of breath and manages this by sitting up and watching TV. She also reports swelling in her legs, particularly the left leg, which extends from the knee to the toes. This is managed with daily furosemide,  which she reports as effective.  She also reports weakness on the left side, which she attributes to previous knee surgery. She has not experienced any sudden onset weakness, numbness, or slurred speech. She reports a change in vision, described as a curtain coming across the eye, which resolves upon closing and reopening the eyes. She attributes this to needing new glasses following cataract surgery.  The patient denies any recent illnesses, fevers, chills, cold sweats, nausea, vomiting, diarrhea, or loose stools, but reports excessive sweating. She is currently on a regimen of Zetia, furosemide, metoprolol, simvastatin, Trandolapril, Valtrex, and vitamin D. She also takes Cipro before dental procedures due to a propensity for infection. She denies taking potassium supplements.  The patient's lipid profile and kidney function were last checked in February 2024, with slightly elevated triglycerides but otherwise within normal limits. She is due for a follow-up with her primary care provider soon.  LABS REVIEWED:     Cardiovascular ROS: positive for - dyspnea on exertion, edema, orthopnea, paroxysmal nocturnal dyspnea, and relatively well-controlled with the use of Lasix. negative for - chest pain, irregular heartbeat, palpitations, rapid heart rate, shortness of breath, or lightheadedness, syncope or near syncope, TIA or emesis fugax, claudication  ROS:  Review of Systems - Negative except L side weak -& swelling from L knee Sgx; chronic back pain as well as hips.  Positional dizziness.  Easy fatigue- Deconditioning ; somewhat sedentary    Objective  Medications - Furosemide 40 mg daily - Amlodipine 5 mg daily -  Cipro as needed for dental procedures - Zetia 10 mg daily - Metoprolol succinate 25 mg daily - Simvastatin 80 mg daily - Trandolapril 2 mg daily - Valtrex 1000 mg three times a day for outbreak - Vitamin D 1.25 mg twice a week  Studies Reviewed: Marland Kitchen   EKG  Interpretation Date/Time:  Monday June 18 2023 08:40:53 EDT Ventricular Rate:  78 PR Interval:  168 QRS Duration:  66 QT Interval:  388 QTC Calculation: 442 R Axis:   4  Text Interpretation: Normal sinus rhythm Possible Inferior infarct (cited on or before 12-Feb-2020) Cannot rule out Anterior infarct (cited on or before 12-Feb-2020) No significant change since last tracing Confirmed by Bryan Lemma ([id number]) on 06/18/2023 8:57:48 AM    LABS (10/16/2022): Total cholesterol: 177 mg/dL; Triglycerides: 165 mg/dL; HDL: 61 mg/dL; LDL: 88 mg/dL; Cr: 1.31 mg/dL; Potassium: 4.7 mEq/L  Previous Studies ECHO 01/06/2020: Normal LV size and function.  Mild LVH.  EF 60 to 65%-no RWMA.  GR 1 DD.  Essentially normal valves.  Normal RAP and RAP. Myoview 12/09/2020: Normal LV EF 55 to 65%.  No EKG changes.  LOW RISK/NORMAL STUDY Carotid Dopplers 10/07/2020: 1 - 40% R ICA.  LICA disease.  Normal vertebral and subclavian arteries. Lower Extremity Venous Dopplers: No DVT  Risk Assessment/Calculations:          Physical Exam:   VS:  BP 136/77 (BP Location: Left Arm, Patient Position: Sitting, Cuff Size: Large)   Pulse 78   Ht 5\' 5"  (1.651 m)   Wt 210 lb 6.4 oz (95.4 kg)   SpO2 97%   BMI 35.01 kg/m    Wt Readings from Last 3 Encounters:  06/18/23 210 lb 6.4 oz (95.4 kg)  05/30/23 210 lb 12.8 oz (95.6 kg)  02/05/23 208 lb (94.3 kg)    GEN: Well nourished, well groomed; in no acute distress; moderately obese; somewhat poor historian. NECK: No JVD; No carotid bruits CARDIAC: Normal S1, S2; RRR, soft 1/6 SEM at RUSB otherwise no additional murmurs, rubs, gallops RESPIRATORY:  Clear to auscultation without rales, wheezing or rhonchi ; nonlabored, good air movement. ABDOMEN: Soft, non-tender, non-distended EXTREMITIES:  No edema; No deformity     ASSESSMENT AND PLAN: .    Problem List Items Addressed This Visit       Cardiology Problems   Atherosclerosis of aorta (HCC)    Continue management of  blood pressure and lipids as well as other risk factors.      Coronary artery disease, non-occlusive - Primary (Chronic)    Nonocclusive disease by catheterization in 2005 and 2012, Coronary CTA also showed similar moderate disease in 2019.  Myoview and echo in 2022 both look pretty normal.  So far everything has been nonischemic.  Was last evaluated for sound like musculoskeletal chest pain => Resolved with topical gel. No exacerbation with movement or exercise. No associated chest pain. -Continue current management. Amlodipine 5 mg along with Toprol 25 mg daily for history trandolapril 2 mg daily for afterload reduction. On Zetia 10 mg along with simvastatin 80 mg daily labs not quite at an LDL less than 70 but reasonable acceptable given her advanced age. I recommended aspirin in the past, I do not think she is taking it every day.      Essential hypertension (Chronic)    Borderline blood pressure readings. -Continue Amlodipine 5mg , Toprol 25 mg, and trandolapril 2mg  daily. -> Is on standing dose of loop diuretic-furosemide 40 mg daily.  Relevant Orders   EKG 12-Lead (Completed)   Hyperlipidemia with target LDL less than 100 (Chronic)    Previous LDL up and down to 66, most recent LDL is little bit higher on stable dose of simvastatin and Zetia.LDL slightly elevated at 88, but would not recommend aggressive management, given advanced age and deconditioning. -Continue Zetia 10mg  daily and Simvastatin 80mg  daily. .->  Could potentially switch from simvastatin to more potent statin such as rosuvastatin 40 mg.      PSVT (paroxysmal supraventricular tachycardia) (HCC) (Chronic)    No prolonged episodes.  Just occasional palpitations lasting for approximately a minute, not associated with dizziness or wooziness. -Continue current management. => Low-dose Toprol 25 mg daily. -Reminded her of vagal maneuvers.        Other   Bilateral lower extremity edema (Chronic)    Again, difficult  to say if this is related to diastolic heart failure, probably more related to venous stasis. Noted more in left leg (related to prior surgery), extending to toes. Managed with daily Furosemide. -Continue Furosemide 40mg  daily. -Consider additional dose of Furosemide if nocturnal dyspnea occurs.      Health care maintenance    -Continue current medications including Metoprolol succinate 25mg  daily, Valtrex 1000mg  three times a day for outbreaks, and Vitamin D 1.25mg  twice a week. -If weight increases by more than 3 pounds in a day or 5 pounds in a week, take an extra dose of Lasix until weight returns to baseline.      Orthopnea (Chronic)    Orthopnea / Paroxysmal nocturnal dyspnea -> Occasional episodes, managed by sitting up and watching TV. Only grade 1 diastolic function noted on echo with normal EF.  I am not sure if this could be related to body habitus but is also associated with some PND -Consider additional dose of Furosemide if episodes occur.            Dispo: Return in about 1 year (around 06/17/2024) for 1 Yr Follow-up. -Follow-up in one year, or sooner if needed.   Total time spent: 16 min spent with patient + 16 min spent charting = 32 min      Signed, Marykay Lex, MD, MS Bryan Lemma, M.D., M.S. Interventional Cardiologist  [facility]  Pager # [phone] Phone # [phone] [address] [place], [place] [address]

## 2023-06-18 NOTE — Patient Instructions (Addendum)
Medication Instructions:  No changes  If you notice you are little short of breath when you wake up - suggest  weigh yourself if your weight has increase overnight then take an  dose of furosemide    *If you need a refill on your cardiac medications before your next appointment, please call your pharmacy*   Lab Work:   Not needed .   Testing/Procedures: Not needed   Follow-Up: At [facility], you and your health needs are our priority.  As part of our continuing mission to provide you with exceptional heart care, we have created designated Provider Care Teams.  These Care Teams include your primary Cardiologist (physician) and Advanced Practice Providers (APPs -  Physician Assistants and Nurse Practitioners) who all work together to provide you with the care you need, when you need it.  We recommend signing up for the patient portal called "MyChart".  Sign up information is provided on this After Visit Summary.  MyChart is used to connect with patients for Virtual Visits (Telemedicine).  Patients are able to view lab/test results, encounter notes, upcoming appointments, etc.  Non-urgent messages can be sent to your provider as well.   To learn more about what you can do with MyChart, go to [email or web address].    Your next appointment:   12 month(s)  The format for your next appointment:   In Person  Provider:   Bryan Lemma, MD

## 2023-06-20 ENCOUNTER — Ambulatory Visit: Payer: Self-pay | Admitting: Nurse Practitioner

## 2023-06-20 ENCOUNTER — Ambulatory Visit (INDEPENDENT_AMBULATORY_CARE_PROVIDER_SITE_OTHER): Payer: 59

## 2023-06-20 DIAGNOSIS — Z0000 Encounter for general adult medical examination without abnormal findings: Secondary | ICD-10-CM | POA: Diagnosis not present

## 2023-06-20 NOTE — Patient Instructions (Signed)
Peggy [name] , Thank you for taking time to come for your Medicare Wellness Visit. I appreciate your ongoing commitment to your health goals. Please review the following plan we discussed and let me know if I can assist you in the future.   Referrals/Orders/Follow-Ups/Clinician Recommendations: none  This is a list of the screening recommended for you and due dates:  Health Maintenance  Topic Date Due   Flu Shot  04/05/2023   COVID-19 Vaccine (7 - 2023-24 season) 07/06/2023*   Medicare Annual Wellness Visit  06/19/2024   DTaP/Tdap/Td vaccine (2 - Td or Tdap) 11/17/2027   Pneumonia Vaccine  Completed   DEXA scan (bone density measurement)  Completed   Hepatitis C Screening  Completed   Zoster (Shingles) Vaccine  Completed   HPV Vaccine  Aged Out   Colon Cancer Screening  Discontinued  *Topic was postponed. The date shown is not the original due date.    Advanced directives: (ACP Link)Information on Advanced Care Planning can be found at [facility] Advance Health Care Directives Advance Health Care Directives ([email or web address]   Next Medicare Annual Wellness Visit scheduled for next year: No, office will schedule appointment  Insert Preventive Care attachment Insert FALL PREVENTION attachment if needed

## 2023-06-20 NOTE — Progress Notes (Signed)
Subjective:   Peggy [name] is a 77 y.o. female who presents for Medicare Annual (Subsequent) preventive examination.  Visit Complete: Virtual I connected with  Anthonette Legato on 06/20/23 by a audio enabled telemedicine application and verified that I am speaking with the correct person using two identifiers.  Patient Location: Home  Provider Location: Office/Clinic  I discussed the limitations of evaluation and management by telemedicine. The patient expressed understanding and agreed to proceed.  Vital Signs: Because this visit was a virtual/telehealth visit, some criteria may be missing or patient reported. Any vitals not documented were not able to be obtained and vitals that have been documented are patient reported.  e.  Cardiac Risk Factors include: advanced age (>110men, >74 women);dyslipidemia;hypertension     Objective:    Today's Vitals   06/20/23 0816  PainSc: 6    There is no height or weight on file to calculate BMI.     06/20/2023    8:26 AM 06/01/2022    9:18 AM 05/26/2021   10:31 AM 08/18/2020    9:39 AM 12/10/2019    9:24 AM 08/13/2019   10:49 AM 07/15/2019    9:37 AM  Advanced Directives  Does Patient Have a Medical Advance Directive? No No No No No Yes No  Type of Holiday representative of Healthcare Power of Attorney in Chart?      No - copy requested   Would patient like information on creating a medical advance directive?   No - Patient declined    No - Patient declined    Current Medications (verified) Outpatient Encounter Medications as of 06/20/2023  Medication Sig   acetaminophen (TYLENOL) 500 MG tablet Take 1,000 mg by mouth 2 (two) times daily as needed for moderate pain or headache.    amLODipine (NORVASC) 2.5 MG tablet TAKE 1 TABLET(2.5 MG) BY MOUTH EVERY EVENING   ciprofloxacin (CIPRO) 500 MG tablet TAKE 1 TABLET BY MOUTH TWICE DAILY FOR 3 DAYS FOR DENTAL PROCEDURES   diazepam (VALIUM) 5 MG tablet Take 5  mg by mouth every 6 (six) hours as needed.   diphenhydrAMINE (BENADRYL) 25 mg capsule Take 25 mg by mouth daily as needed for allergies.   ezetimibe (ZETIA) 10 MG tablet TAKE 1 TABLET(10 MG) BY MOUTH DAILY   FLUoxetine (PROZAC) 40 MG capsule Take 40 mg by mouth daily as needed (depression).   Magnesium 250 MG TABS Take 1 tablet (250 mg total) by mouth every evening. Take with evening meal   metoprolol succinate (TOPROL-XL) 25 MG 24 hr tablet TAKE 1 TABLET(25 MG) BY MOUTH DAILY   simvastatin (ZOCOR) 80 MG tablet TAKE 1 TABLET(80 MG) BY MOUTH AT BEDTIME   trandolapril (MAVIK) 2 MG tablet TAKE 1 TABLET(2 MG) BY MOUTH DAILY   valACYclovir (VALTREX) 1000 MG tablet TAKE 1/2 TABLET BY MOUTH THREE TIMES DAILY AS NEEDED FOR OUTBREAK   Vitamin D, Ergocalciferol, (DRISDOL) 1.25 MG (50000 UNIT) CAPS capsule TAKE 1 CAPSULE BY MOUTH 2 TIMES A WEEK   furosemide (LASIX) 40 MG tablet Take 1 tablet (40 mg total) by mouth daily.   nitroGLYCERIN (NITROSTAT) 0.4 MG SL tablet Place 1 tablet (0.4 mg total) under the tongue every 5 (five) minutes as needed for chest pain.   Potassium 99 MG TABS Take 99 mg by mouth daily. Take 1 tablet by mouth daily. (Patient not taking: Reported on 06/20/2023)   No facility-administered encounter medications on file as  of 06/20/2023.    Allergies (verified) Penicillins, Azithromycin, Levaquin [levofloxacin in d5w], Clindamycin/lincomycin, Erythromycin base, Keflex [cephalexin], Sulfa antibiotics, and Tetracyclines & related   History: Past Medical History:  Diagnosis Date   Anxiety    Arthritis    "all over"   Asthma    Bladder cancer (HCC)    Chest pain at rest 02/18/2015   Chronic bronchitis (HCC)    'get it q yr"   Chronic lower back pain    Coronary artery disease, non-occlusive 2005   Cardiac catheter 2005 and 2012 for "anginal symptoms "showed angiographically normal coronary arteries. -->  Negative Myoview June 2016.  Coronary calcification noted on chest CT ->  calcium score 131, moderate proximal LAD and LCx disease on coronary CTA - (2019)   Daily headache    Deafness in left ear    Depression    DVT (deep venous thrombosis) (HCC) 2009/2010   "probably left leg"    GERD (gastroesophageal reflux disease)    History of kidney stones    Hyperlipidemia with target LDL less than 100    Hypertension    Leg swelling 12/12/2017   Nummular eczema    Sciatica 12/10/2012   Skin cancer    "right abdomen"   Past Surgical History:  Procedure Laterality Date   ABDOMINAL HYSTERECTOMY  1980's   APPENDECTOMY Right    BACK SURGERY     x 2, lower   BLADDER TUMOR EXCISION     BREAST BIOPSY Right    BREAST EXCISIONAL BIOPSY Right    CAROTID ARTERY DOPPLERS   10/2017   Carotid Dopplers: 40-59% stenosis on the Right Internal Carotid, < 40% stenosis in the Left.  Both vertebral arteries show normal flow.  Left subclavian artery is normal, but there is some flow disturbance in the right subclavian artery.   CATARACT EXTRACTION Left    2024   COLONOSCOPY WITH PROPOFOL N/A 02/26/2015   Procedure: COLONOSCOPY WITH PROPOFOL;  Surgeon: Jeani Hawking, MD;  Location: [facility];  Service: Endoscopy;  Laterality: N/A;   COLONOSCOPY WITH PROPOFOL N/A 12/06/2017   Procedure: COLONOSCOPY WITH PROPOFOL;  Surgeon: Charna Elizabeth, MD;  Location: [facility];  Service: Endoscopy;  Laterality: N/A;   CORONARY CALCIUM SCORE & CT ANGIOGRAM  11/2017   Coronary calcium score 131 (moderate risk).  Moderate proximal LAD & LCx stenosis --> LOW RISK STUDY.  No evidence to suspect cardiac related chest pain --> correlates with cardiac catheterizations   ENTEROSCOPY N/A 12/14/2017   Procedure: ENTEROSCOPY;  Surgeon: Jeani Hawking, MD;  Location: [facility];  Service: Endoscopy;  Laterality: N/A;   ESOPHAGOGASTRODUODENOSCOPY Left 02/20/2015   Procedure: ESOPHAGOGASTRODUODENOSCOPY (EGD);  Surgeon: Rachael Fee, MD;  Location: [facility];  Service: Endoscopy;  Laterality: Left;    ESOPHAGOGASTRODUODENOSCOPY (EGD) WITH PROPOFOL N/A 12/06/2017   Procedure: ESOPHAGOGASTRODUODENOSCOPY (EGD) WITH PROPOFOL;  Surgeon: Charna Elizabeth, MD;  Location: [facility];  Service: Endoscopy;  Laterality: N/A;   HOT HEMOSTASIS N/A 12/14/2017   Procedure: HOT HEMOSTASIS (ARGON PLASMA COAGULATION/BICAP);  Surgeon: Jeani Hawking, MD;  Location: [facility];  Service: Endoscopy;  Laterality: N/A;   JOINT REPLACEMENT     KNEE ARTHROSCOPY Right ~ 2000   LEFT HEART CATH AND CORONARY ANGIOGRAPHY  11/'05; 2/'12   Angiographically normal coronary arteries   LUMBAR DISC SURGERY  1980's X 1; ~ 2002   MEDIAL PARTIAL KNEE REPLACEMENT Right 2001   NM MYOVIEW LTD  02/2015   EF 65%.  No reversible ischemia.  Possible scar along the  cardiac apex and anteroseptal wall.  (Mild severity, medium-sized defect in the apex anteroseptal wall noted on both resting and stress images).  No inducible ischemia.  Read as LOW RISK   NM MYOVIEW LTD  12/09/2020   LOW RISK.  EF 55 to 65%.  No ischemia or infarct.   PARTIAL KNEE ARTHROPLASTY Left 2009   REPLACEMENT TOTAL KNEE BILATERAL Bilateral    REVISION TOTAL KNEE ARTHROPLASTY Left    SKIN CANCER EXCISION Right    "abdomen"   TRANSTHORACIC ECHOCARDIOGRAM  06/'16; 2/'19; 1/'21    A) mod Conc LVH. EF 60-65%.  No RWMA. GR 1 D. Mild Ao Stenosis. Mild LA dilation.;  B) EF 60-65%.  Mild LVH.  No or W MA.  No significant valve disease.;l 01/2020: Normal LV systolic function.  Mild LVH.  GR 1 DD.  EF 60 to 65%.  No R WMA.  Trace AI.  No AS   Family History  Problem Relation Age of Onset   Heart failure Mother    Cancer Mother    Hypertension Mother    Hypertension Father    Heart attack Father        Noted on cardiology intake form as "massive heart attack" at age 74   Heart failure Sister    Heart attack Sister        Not listed on her cardiology intake form   Breast cancer Sister    Heart failure Sister    Heart attack Sister        Not listed on her cardiology  intake   Cancer Sister    Breast cancer Sister    Social History   Socioeconomic History   Marital status: Single    Spouse name: Not on file   Number of children: 1   Years of education: Not on file   Highest education level: Not on file  Occupational History   Occupation: Retired    Associate Professor: RETIRED    Comment: Psychiatric nurse   Tobacco Use   Smoking status: Never   Smokeless tobacco: Never  Vaping Use   Vaping status: Never Used  Substance and Sexual Activity   Alcohol use: No   Drug use: No   Sexual activity: Not Currently  Other Topics Concern   Not on file  Social History Narrative   Relocated from [place]   She is currently "'.  She lives with her son (who is autistic).  She herself is disabled, and not working.   She never smoked and never drank alcohol.   She enjoys walking 7 days a week.   Social Determinants of Health   Financial Resource Strain: Low Risk  (06/20/2023)   Overall Financial Resource Strain (CARDIA)    Difficulty of Paying Living Expenses: Not hard at all  Food Insecurity: No Food Insecurity (06/20/2023)   Hunger Vital Sign    Worried About Running Out of Food in the Last Year: Never true    Ran Out of Food in the Last Year: Never true  Transportation Needs: No Transportation Needs (06/20/2023)   PRAPARE - Administrator, Civil Service (Medical): No    Lack of Transportation (Non-Medical): No  Physical Activity: Inactive (06/20/2023)   Exercise Vital Sign    Days of Exercise per Week: 0 days    Minutes of Exercise per Session: 0 min  Stress: No Stress Concern Present (06/20/2023)   [employer] of Occupational Health - Occupational Stress Questionnaire    Feeling of  Stress : Not at all  Social Connections: Socially Isolated (06/20/2023)   Social Connection and Isolation Panel [NHANES]    Frequency of Communication with Friends and Family: More than three times a week    Frequency of Social Gatherings with Friends and  Family: Twice a week    Attends Religious Services: Never    Database administrator or Organizations: No    Attends Engineer, structural: Never    Marital Status: Never married    Tobacco Counseling Counseling given: Not Answered   Clinical Intake:  Pre-visit preparation completed: Yes  Pain : 0-10 Pain Score: 6  Pain Type: Chronic pain Pain Location: Generalized Pain Descriptors / Indicators: Aching Pain Onset: More than a month ago Pain Frequency: Constant     Nutritional Risks: None Diabetes: No  How often do you need to have someone help you when you read instructions, pamphlets, or other written materials from your doctor or pharmacy?: 1 - Never  Interpreter Needed?: No  Information entered by :: NAllen LPN   Activities of Daily Living    06/20/2023    8:18 AM  In your present state of health, do you have any difficulty performing the following activities:  Hearing? 0  Vision? 1  Comment need new glasses  Difficulty concentrating or making decisions? 1  Walking or climbing stairs? 1  Dressing or bathing? 0  Doing errands, shopping? 0  Comment usually has someone with her  Preparing Food and eating ? N  Using the Toilet? N  In the past six months, have you accidently leaked urine? Y  Comment if held too long  Do you have problems with loss of bowel control? N  Managing your Medications? N  Managing your Finances? N  Housekeeping or managing your Housekeeping? N    Patient Care Team: Arnette Felts, FNP as PCP - General (General Practice) Marykay Lex, MD as PCP - Cardiology (Cardiology)  Indicate any recent Medical Services you may have received from other than [facility] providers in the past year (date may be approximate).     Assessment:   This is a routine wellness examination for [employer].  Hearing/Vision screen Hearing Screening - Comments:: Denies hearing issues Vision Screening - Comments:: Regular eye exams, [facility]    Goals Addressed             This Visit's Progress    Patient Stated       06/20/2023, work on drinking more water       Depression Screen    06/20/2023    8:28 AM 10/16/2022    9:49 AM 06/01/2022    9:20 AM 04/12/2022    8:45 AM 05/26/2021   10:33 AM 08/18/2020    9:39 AM 08/18/2020    8:48 AM  PHQ 2/9 Scores  PHQ - 2 Score 0 0 0 0 0 0 0  PHQ- 9 Score 0          Fall Risk    06/20/2023    8:27 AM 02/05/2023   10:28 AM 10/16/2022    9:49 AM 06/01/2022    9:19 AM 04/12/2022    8:45 AM  Fall Risk   Falls in the past year? 0 0 0 0 0  Number falls in past yr: 0 0 0 0 0  Injury with Fall? 0 0 0 0 0  Risk for fall due to : Medication side effect;Impaired mobility;Impaired balance/gait No Fall Risks No Fall Risks Impaired balance/gait;Impaired mobility;Medication  side effect No Fall Risks  Follow up Falls prevention discussed;Falls evaluation completed Falls evaluation completed Falls evaluation completed Falls prevention discussed;Falls evaluation completed;Education provided Falls evaluation completed    MEDICARE RISK AT HOME: Medicare Risk at Home Any stairs in or around the home?: No If so, are there any without handrails?: No Home free of loose throw rugs in walkways, pet beds, electrical cords, etc?: Yes Adequate lighting in your home to reduce risk of falls?: Yes Life alert?: No Use of a cane, walker or w/c?: Yes Grab bars in the bathroom?: No Shower chair or bench in shower?: No Elevated toilet seat or a handicapped toilet?: No  TIMED UP AND GO:  Was the test performed?  No    Cognitive Function:        06/20/2023    8:29 AM 06/01/2022    9:22 AM 05/26/2021   10:35 AM 08/18/2020    9:41 AM 08/13/2019   10:54 AM  6CIT Screen  What Year? 0 points 0 points 0 points 0 points 0 points  What month? 0 points 0 points 0 points 0 points 0 points  What time? 0 points 0 points 0 points 0 points 0 points  Count back from 20 0 points 0 points 0 points 0 points 0 points   Months in reverse 0 points 0 points 0 points 0 points 0 points  Repeat phrase 0 points 0 points 0 points 0 points 0 points  Total Score 0 points 0 points 0 points 0 points 0 points    Immunizations Immunization History  Administered Date(s) Administered   Fluad Quad(high Dose 65+) 05/25/2020, 06/28/2021, 05/16/2022   Influenza, High Dose Seasonal PF 07/01/2019   Influenza-Unspecified 05/13/2018   PFIZER(Purple Top)SARS-COV-2 Vaccination 09/23/2019, 10/24/2019, 06/16/2020, 12/29/2020, 07/05/2021   Pfizer(Comirnaty)Fall Seasonal Vaccine 12 years and older 08/11/2022   Pneumococcal Conjugate-13 09/03/2018   Pneumococcal Polysaccharide-23 08/09/2021   Tdap 11/16/2017   Zoster Recombinant(Shingrix) 09/04/2016, 02/02/2017    TDAP status: Up to date  Flu Vaccine status: Due, Education has been provided regarding the importance of this vaccine. Advised may receive this vaccine at local pharmacy or Health Dept. Aware to provide a copy of the vaccination record if obtained from local pharmacy or Health Dept. Verbalized acceptance and understanding.  Pneumococcal vaccine status: Up to date  Covid-19 vaccine status: Information provided on how to obtain vaccines.   Qualifies for Shingles Vaccine? Yes   Zostavax completed Yes   Shingrix Completed?: Yes  Screening Tests Health Maintenance  Topic Date Due   INFLUENZA VACCINE  04/05/2023   COVID-19 Vaccine (7 - 2023-24 season) 07/06/2023 (Originally 05/06/2023)   Medicare Annual Wellness (AWV)  06/19/2024   DTaP/Tdap/Td (2 - Td or Tdap) 11/17/2027   Pneumonia Vaccine 32+ Years old  Completed   DEXA SCAN  Completed   Hepatitis C Screening  Completed   Zoster Vaccines- Shingrix  Completed   HPV VACCINES  Aged Out   Colonoscopy  Discontinued    Health Maintenance  Health Maintenance Due  Topic Date Due   INFLUENZA VACCINE  04/05/2023    Colorectal cancer screening: No longer required.   Mammogram status: No longer required due to  age.  Bone Density status: Completed 10/09/2022.   Lung Cancer Screening: (Low Dose CT Chest recommended if Age 70-80 years, 20 pack-year currently smoking OR have quit w/in 15years.) does not qualify.   Lung Cancer Screening Referral: no  Additional Screening:  Hepatitis C Screening: does qualify; Completed 07/01/2019  Vision Screening: Recommended  annual ophthalmology exams for early detection of glaucoma and other disorders of the eye. Is the patient up to date with their annual eye exam?  Yes  Who is the provider or what is the name of the office in which the patient attends annual eye exams? [facility] If pt is not established with a provider, would they like to be referred to a provider to establish care? No .   Dental Screening: Recommended annual dental exams for proper oral hygiene  Diabetic Foot Exam: n/a  Community Resource Referral / Chronic Care Management: CRR required this visit?  No   CCM required this visit?  No     Plan:     I have personally reviewed and noted the following in the patient's chart:   Medical and social history Use of alcohol, tobacco or illicit drugs  Current medications and supplements including opioid prescriptions. Patient is not currently taking opioid prescriptions. Functional ability and status Nutritional status Physical activity Advanced directives List of other physicians Hospitalizations, surgeries, and ER visits in previous 12 months Vitals Screenings to include cognitive, depression, and falls Referrals and appointments  In addition, I have reviewed and discussed with patient certain preventive protocols, quality metrics, and best practice recommendations. A written personalized care plan for preventive services as well as general preventive health recommendations were provided to patient.     KIEREN RICCI, LPN   21/30/[id number]   After Visit Summary: (Pick Up) Due to this being a telephonic visit, with patients  personalized plan was offered to patient and patient has requested to Pick up at office.  Nurse Notes: none

## 2023-06-24 ENCOUNTER — Encounter: Payer: Self-pay | Admitting: Cardiology

## 2023-06-24 DIAGNOSIS — R0601 Orthopnea: Secondary | ICD-10-CM | POA: Insufficient documentation

## 2023-06-24 DIAGNOSIS — Z0000 Encounter for general adult medical examination without abnormal findings: Secondary | ICD-10-CM | POA: Insufficient documentation

## 2023-06-24 DIAGNOSIS — R600 Localized edema: Secondary | ICD-10-CM | POA: Insufficient documentation

## 2023-06-24 NOTE — Assessment & Plan Note (Signed)
No prolonged episodes.  Just occasional palpitations lasting for approximately a minute, not associated with dizziness or wooziness. -Continue current management. => Low-dose Toprol 25 mg daily. -Reminded her of vagal maneuvers.

## 2023-06-24 NOTE — Assessment & Plan Note (Signed)
Nonocclusive disease by catheterization in 2005 and 2012, Coronary CTA also showed similar moderate disease in 2019.  Myoview and echo in 2022 both look pretty normal.  So far everything has been nonischemic.  Was last evaluated for sound like musculoskeletal chest pain => Resolved with topical gel. No exacerbation with movement or exercise. No associated chest pain. -Continue current management. Amlodipine 5 mg along with Toprol 25 mg daily for history trandolapril 2 mg daily for afterload reduction. On Zetia 10 mg along with simvastatin 80 mg daily labs not quite at an LDL less than 70 but reasonable acceptable given her advanced age. I recommended aspirin in the past, I do not think she is taking it every day.

## 2023-06-24 NOTE — Assessment & Plan Note (Signed)
Previous LDL up and down to 66, most recent LDL is little bit higher on stable dose of simvastatin and Zetia.LDL slightly elevated at 88, but would not recommend aggressive management, given advanced age and deconditioning. -Continue Zetia 10mg  daily and Simvastatin 80mg  daily. .->  Could potentially switch from simvastatin to more potent statin such as rosuvastatin 40 mg.

## 2023-06-24 NOTE — Assessment & Plan Note (Signed)
Continue management of blood pressure and lipids as well as other risk factors.

## 2023-06-24 NOTE — Assessment & Plan Note (Signed)
Orthopnea / Paroxysmal nocturnal dyspnea -> Occasional episodes, managed by sitting up and watching TV. Only grade 1 diastolic function noted on echo with normal EF.  I am not sure if this could be related to body habitus but is also associated with some PND -Consider additional dose of Furosemide if episodes occur.

## 2023-06-24 NOTE — Assessment & Plan Note (Signed)
Borderline blood pressure readings. -Continue Amlodipine 5mg , Toprol 25 mg, and trandolapril 2mg  daily. -> Is on standing dose of loop diuretic-furosemide 40 mg daily.

## 2023-06-24 NOTE — Assessment & Plan Note (Signed)
-  Continue current medications including Metoprolol succinate 25mg  daily, Valtrex 1000mg  three times a day for outbreaks, and Vitamin D 1.25mg  twice a week. -If weight increases by more than 3 pounds in a day or 5 pounds in a week, take an extra dose of Lasix until weight returns to baseline.

## 2023-06-24 NOTE — Assessment & Plan Note (Signed)
Again, difficult to say if this is related to diastolic heart failure, probably more related to venous stasis. Noted more in left leg (related to prior surgery), extending to toes. Managed with daily Furosemide. -Continue Furosemide 40mg  daily. -Consider additional dose of Furosemide if nocturnal dyspnea occurs.

## 2023-06-25 ENCOUNTER — Ambulatory Visit: Payer: 59 | Admitting: Nurse Practitioner

## 2023-06-25 ENCOUNTER — Encounter: Payer: Self-pay | Admitting: Nurse Practitioner

## 2023-06-25 VITALS — BP 132/80 | HR 81 | Temp 98.1°F | Ht 65.0 in | Wt 205.0 lb

## 2023-06-25 DIAGNOSIS — I10 Essential (primary) hypertension: Secondary | ICD-10-CM

## 2023-06-25 DIAGNOSIS — Z0000 Encounter for general adult medical examination without abnormal findings: Secondary | ICD-10-CM | POA: Diagnosis not present

## 2023-06-25 DIAGNOSIS — G8929 Other chronic pain: Secondary | ICD-10-CM

## 2023-06-25 DIAGNOSIS — Z6834 Body mass index (BMI) 34.0-34.9, adult: Secondary | ICD-10-CM

## 2023-06-25 DIAGNOSIS — E782 Mixed hyperlipidemia: Secondary | ICD-10-CM | POA: Insufficient documentation

## 2023-06-25 DIAGNOSIS — Z23 Encounter for immunization: Secondary | ICD-10-CM

## 2023-06-25 DIAGNOSIS — R252 Cramp and spasm: Secondary | ICD-10-CM | POA: Diagnosis not present

## 2023-06-25 DIAGNOSIS — E559 Vitamin D deficiency, unspecified: Secondary | ICD-10-CM

## 2023-06-25 DIAGNOSIS — Z79899 Other long term (current) drug therapy: Secondary | ICD-10-CM | POA: Diagnosis not present

## 2023-06-25 DIAGNOSIS — M25561 Pain in right knee: Secondary | ICD-10-CM | POA: Diagnosis not present

## 2023-06-25 DIAGNOSIS — E6609 Other obesity due to excess calories: Secondary | ICD-10-CM

## 2023-06-25 DIAGNOSIS — I700 Atherosclerosis of aorta: Secondary | ICD-10-CM | POA: Diagnosis not present

## 2023-06-25 DIAGNOSIS — E66811 Obesity, class 1: Secondary | ICD-10-CM | POA: Insufficient documentation

## 2023-06-25 DIAGNOSIS — M25562 Pain in left knee: Secondary | ICD-10-CM

## 2023-06-25 LAB — POCT URINALYSIS DIP (CLINITEK)
Bilirubin, UA: NEGATIVE
Blood, UA: NEGATIVE
Glucose, UA: NEGATIVE mg/dL
Ketones, POC UA: NEGATIVE mg/dL
Leukocytes, UA: NEGATIVE
Nitrite, UA: NEGATIVE
POC PROTEIN,UA: NEGATIVE
Spec Grav, UA: 1.02 (ref 1.010–1.025)
Urobilinogen, UA: 0.2 U/dL
pH, UA: 5.5 (ref 5.0–8.0)

## 2023-06-25 MED ORDER — VALACYCLOVIR HCL 1 G PO TABS: ORAL_TABLET | ORAL | 2 refills | Status: DC

## 2023-06-25 MED ORDER — TRANDOLAPRIL 2 MG PO TABS: 2.0000 mg | ORAL_TABLET | Freq: Every day | ORAL | 1 refills | Status: DC

## 2023-06-25 MED ORDER — MAGNESIUM 250 MG PO TABS: 1.0000 | ORAL_TABLET | Freq: Every evening | ORAL | 1 refills | Status: DC

## 2023-06-25 MED ORDER — SIMVASTATIN 80 MG PO TABS: ORAL_TABLET | ORAL | 1 refills | Status: DC

## 2023-06-25 NOTE — Patient Instructions (Signed)
Health Maintenance  Topic Date Due   COVID-19 Vaccine (7 - 2023-24 season) 07/06/2023*   Medicare Annual Wellness Visit  06/19/2024   DTaP/Tdap/Td vaccine (2 - Td or Tdap) 11/17/2027   Pneumonia Vaccine  Completed   Flu Shot  Completed   DEXA scan (bone density measurement)  Completed   Hepatitis C Screening  Completed   Zoster (Shingles) Vaccine  Completed   HPV Vaccine  Aged Out   Colon Cancer Screening  Discontinued  *Topic was postponed. The date shown is not the original due date.

## 2023-06-25 NOTE — Assessment & Plan Note (Signed)
She is encouraged to strive for BMI less than 30 to decrease cardiac risk. Advised to aim for at least 150 minutes of exercise per week.

## 2023-06-25 NOTE — Assessment & Plan Note (Signed)
Will check vitamin D level and will send refill for her vitamin d once get results.      Also encouraged to spend 15 minutes in the sun daily.

## 2023-06-25 NOTE — Assessment & Plan Note (Signed)
Behavior modifications discussed and diet history reviewed.   Pt will continue to exercise regularly and modify diet with low GI, plant based foods and decrease intake of processed foods.  Recommend intake of daily multivitamin, Vitamin D, and calcium.  Recommend mammogram for preventive screenings, as well as recommend immunizations that include influenza, TDAP, and Shingles

## 2023-06-25 NOTE — Assessment & Plan Note (Signed)
Blood pressure is elevated, relates this to rushing to her appt.

## 2023-06-25 NOTE — Progress Notes (Signed)
Madelaine Bhat, CMA,acting as a Neurosurgeon for Arnette Felts, FNP.,have documented all relevant documentation on the behalf of Arnette Felts, FNP,as directed by  Arnette Felts, FNP while in the presence of Arnette Felts, FNP.  Subjective:    Patient ID: Peggy [name] , female    DOB: [birth year] , 77 y.o.   MRN: [id number]  Chief Complaint  Patient presents with   Annual Exam    HPI  Patient presents today for HM, Patient reports compliance with medication. Patient denies any chest pain, SOB, or headaches. Patient has no concerns today. Patient previously had EKG.  Patient refuses to get undressed for her physical. She admits to taking her blood pressure medications this morning and feels her blood pressure being elevated is related to rushing here today.   Hypertension This is a chronic problem. The current episode started more than 1 year ago. The problem has been gradually worsening since onset. The problem is controlled. Pertinent negatives include no anxiety or chest pain. There are no associated agents to hypertension. Risk factors for coronary artery disease include sedentary lifestyle and obesity. Past treatments include diuretics. There are no compliance problems.  There is no history of angina. There is no history of chronic renal disease.  Hyperlipidemia This is a chronic problem. The problem is controlled. Recent lipid tests were reviewed and are normal. She has no history of chronic renal disease. There are no known factors aggravating her hyperlipidemia. Pertinent negatives include no chest pain. The current treatment provides moderate improvement of lipids. There are no compliance problems.  Risk factors for coronary artery disease include obesity, dyslipidemia and hypertension.     Past Medical History:  Diagnosis Date   Anxiety    Arthritis    "all over"   Asthma    Bladder cancer (HCC)    Chest pain at rest 02/18/2015   Chronic bronchitis [facility])    'get it q yr"   Chronic lower  back pain    Coronary artery disease, non-occlusive 2005   Cardiac catheter 2005 and 2012 for "anginal symptoms "showed angiographically normal coronary arteries. -->  Negative Myoview June 2016.  Coronary calcification noted on chest CT -> calcium score 131, moderate proximal LAD and LCx disease on coronary CTA - (2019)   Daily headache    Deafness in left ear    Depression    DVT (deep venous thrombosis) (HCC) 2009/2010   "probably left leg"    GERD (gastroesophageal reflux disease)    History of kidney stones    Hyperlipidemia with target LDL less than 100    Hypertension    Leg swelling 12/12/2017   Nummular eczema    Sciatica 12/10/2012   Skin cancer    "right abdomen"     Family History  Problem Relation Age of Onset   Heart failure Mother    Cancer Mother    Hypertension Mother    Hypertension Father    Heart attack Father        Noted on cardiology intake form as "massive heart attack" at age 56   Heart failure Sister    Heart attack Sister        Not listed on her cardiology intake form   Breast cancer Sister    Heart failure Sister    Heart attack Sister        Not listed on her cardiology intake   Cancer Sister    Breast cancer Sister      Current Outpatient Medications:  acetaminophen (TYLENOL) 500 MG tablet, Take 1,000 mg by mouth 2 (two) times daily as needed for moderate pain or headache. , Disp: , Rfl:    amLODipine (NORVASC) 2.5 MG tablet, TAKE 1 TABLET(2.5 MG) BY MOUTH EVERY EVENING, Disp: 90 tablet, Rfl: 3   diazepam (VALIUM) 5 MG tablet, Take 5 mg by mouth every 6 (six) hours as needed., Disp: , Rfl:    diphenhydrAMINE (BENADRYL) 25 mg capsule, Take 25 mg by mouth daily as needed for allergies., Disp: , Rfl:    ezetimibe (ZETIA) 10 MG tablet, TAKE 1 TABLET(10 MG) BY MOUTH DAILY, Disp: 90 tablet, Rfl: 3   FLUoxetine (PROZAC) 40 MG capsule, Take 40 mg by mouth daily as needed (depression)., Disp: , Rfl:    furosemide (LASIX) 40 MG tablet, Take 1 tablet  (40 mg total) by mouth daily., Disp: 90 tablet, Rfl: 3   Magnesium 250 MG TABS, Take 1 tablet (250 mg total) by mouth every evening. Take with evening meal, Disp: 90 tablet, Rfl: 1   metoprolol succinate (TOPROL-XL) 25 MG 24 hr tablet, TAKE 1 TABLET(25 MG) BY MOUTH DAILY, Disp: 90 tablet, Rfl: 0   nitroGLYCERIN (NITROSTAT) 0.4 MG SL tablet, Place 1 tablet (0.4 mg total) under the tongue every 5 (five) minutes as needed for chest pain., Disp: 90 tablet, Rfl: 3   Potassium 99 MG TABS, Take 99 mg by mouth daily. Take 1 tablet by mouth daily. (Patient not taking: Reported on 06/20/2023), Disp: , Rfl:    simvastatin (ZOCOR) 80 MG tablet, TAKE 1 TABLET(80 MG) BY MOUTH AT BEDTIME, Disp: 90 tablet, Rfl: 1   trandolapril (MAVIK) 2 MG tablet, Take 1 tablet (2 mg total) by mouth daily., Disp: 90 tablet, Rfl: 1   valACYclovir (VALTREX) 1000 MG tablet, TAKE 1/2 TABLET BY MOUTH THREE TIMES DAILY AS NEEDED FOR OUTBREAK, Disp: 135 tablet, Rfl: 2   Vitamin D, Ergocalciferol, (DRISDOL) 1.25 MG (50000 UNIT) CAPS capsule, TAKE 1 CAPSULE BY MOUTH 2 TIMES A WEEK, Disp: 5 capsule, Rfl: 3   Allergies  Allergen Reactions   Penicillins Shortness Of Breath, Itching, Swelling, Rash and Other (See Comments)    Has patient had a PCN reaction causing immediate rash, facial/tongue/throat swelling, SOB or lightheadedness with hypotension: Yes Has patient had a PCN reaction causing severe rash involving mucus membranes or skin necrosis: No Has patient had a PCN reaction that required hospitalization: Yes Has patient had a PCN reaction occurring within the last 10 years: No If all of the above answers are "NO", then may proceed with Cephalosporin use.    Azithromycin Itching, Swelling and Rash   Levaquin [Levofloxacin In D5w] Hives, Itching, Swelling and Rash   Clindamycin/Lincomycin Hives   Erythromycin Base Itching   Keflex [Cephalexin] Itching and Rash   Sulfa Antibiotics Itching and Rash   Tetracyclines & Related Itching  and Rash      The patient states she uses status post hysterectomy for birth control. No LMP recorded. Patient has had a hysterectomy.  Negative for Dysmenorrhea and Negative for Menorrhagia. Negative for: breast discharge, breast lump(s), breast pain and breast self exam. Associated symptoms include abnormal vaginal bleeding. Pertinent negatives include abnormal bleeding (hematology), anxiety, decreased libido, depression, difficulty falling sleep, dyspareunia, history of infertility, nocturia, sexual dysfunction, sleep disturbances, urinary incontinence, urinary urgency, vaginal discharge and vaginal itching. Diet regular.  The patient states her exercise level is minimal with walking - daily at least 30-40 minutes.   The patient's tobacco use is:  Social History  Tobacco Use  Smoking Status Never  Smokeless Tobacco Never  . She has been exposed to passive smoke. The patient's alcohol use is:  Social History   Substance and Sexual Activity  Alcohol Use No    Review of Systems  Constitutional: Negative.   HENT: Negative.    Eyes: Negative.   Respiratory: Negative.    Cardiovascular: Negative.  Negative for chest pain.  Gastrointestinal: Negative.   Endocrine: Negative.   Genitourinary: Negative.   Musculoskeletal: Negative.   Skin: Negative.   Allergic/Immunologic: Negative.   Neurological: Negative.   Hematological: Negative.   Psychiatric/Behavioral: Negative.       Today's Vitals   06/25/23 0857  BP: (!) 160/94  Pulse: 81  Temp: 98.1 F (36.7 C)  Weight: 205 lb (93 kg)  Height: 5\' 5"  (1.651 m)  PainSc: 10-Worst pain ever  PainLoc: Knee   Body mass index is 34.11 kg/m.  Wt Readings from Last 3 Encounters:  06/25/23 205 lb (93 kg)  06/18/23 210 lb 6.4 oz (95.4 kg)  05/30/23 210 lb 12.8 oz (95.6 kg)     Objective:  Physical Exam Vitals reviewed.  Constitutional:      General: She is not in acute distress.    Appearance: Normal appearance. She is  well-developed. She is obese.  HENT:     Head: Normocephalic and atraumatic.     Right Ear: Hearing, tympanic membrane, ear canal and external ear normal. There is no impacted cerumen.     Left Ear: Hearing, tympanic membrane, ear canal and external ear normal. There is no impacted cerumen.     Nose:     Comments: Deferred - masked    Mouth/Throat:     Comments: Deferred - masked Eyes:     General: Lids are normal.     Extraocular Movements: Extraocular movements intact.     Conjunctiva/sclera: Conjunctivae normal.     Pupils: Pupils are equal, round, and reactive to light.     Funduscopic exam:    Right eye: No papilledema.        Left eye: No papilledema.  Neck:     Thyroid: No thyroid mass.     Vascular: No carotid bruit.  Cardiovascular:     Rate and Rhythm: Normal rate and regular rhythm.     Pulses: Normal pulses.     Heart sounds: Normal heart sounds. No murmur heard. Pulmonary:     Effort: Pulmonary effort is normal. No respiratory distress.     Breath sounds: Normal breath sounds. No wheezing.  Chest:     Comments: Breast exam deferred patient refuses to get undressed.  Abdominal:     General: Abdomen is flat. Bowel sounds are normal. There is no distension.     Palpations: Abdomen is soft.     Tenderness: There is no abdominal tenderness.  Genitourinary:    Comments: Deferred  Musculoskeletal:        General: Swelling (bilateral knees are swollen with left more than right) and tenderness (bilateral lower extremities particularly the left) present. Normal range of motion.     Cervical back: Full passive range of motion without pain, normal range of motion and neck supple.     Right lower leg: Edema (trace) present.     Left lower leg: Edema (trace) present.  Skin:    General: Skin is warm and dry.     Capillary Refill: Capillary refill takes less than 2 seconds.     Comments: Unable to visualize full body  due to patient refusing to get undressed. Has healed  surgical scar to knees  Neurological:     General: No focal deficit present.     Mental Status: She is alert and oriented to person, place, and time.     Cranial Nerves: No cranial nerve deficit.     Sensory: No sensory deficit.     Motor: No weakness.  Psychiatric:        Mood and Affect: Mood normal.        Behavior: Behavior normal.        Thought Content: Thought content normal.        Judgment: Judgment normal.         Assessment And Plan:     Encounter for annual health examination Assessment & Plan: Behavior modifications discussed and diet history reviewed.   Pt will continue to exercise regularly and modify diet with low GI, plant based foods and decrease intake of processed foods.  Recommend intake of daily multivitamin, Vitamin D, and calcium.  Recommend mammogram for preventive screenings, as well as recommend immunizations that include influenza, TDAP, and Shingles    Class 1 obesity due to excess calories with serious comorbidity and body mass index (BMI) of 34.0 to 34.9 in adult Assessment & Plan: She is encouraged to strive for BMI less than 30 to decrease cardiac risk. Advised to aim for at least 150 minutes of exercise per week.    Need for influenza vaccination Assessment & Plan: Influenza vaccine administered Encouraged to take Tylenol as needed for fever or muscle aches.   Orders: -     Flu Vaccine Trivalent High Dose (Fluad)  Essential hypertension Assessment & Plan: Blood pressure is elevated, relates this to rushing to her appt.   Orders: -     Microalbumin / creatinine urine ratio -     POCT URINALYSIS DIP (CLINITEK) -     CMP14+EGFR -     Trandolapril; Take 1 tablet (2 mg total) by mouth daily.  Dispense: 90 tablet; Refill: 1  Mixed hyperlipidemia Assessment & Plan: Cholesterol levels are stable. Continue statin.   Orders: -     CMP14+EGFR  Atherosclerosis of aorta (HCC) Assessment & Plan: Continue statin, tolerating  well  Orders: -     CMP14+EGFR -     Lipid panel  Muscle cramps Assessment & Plan: Continue magnesium supplement  Orders: -     Magnesium; Take 1 tablet (250 mg total) by mouth every evening. Take with evening meal  Dispense: 90 tablet; Refill: 1  Vitamin D deficiency Assessment & Plan: Will check vitamin D level and will send refill for her vitamin d once get results.      Also encouraged to spend 15 minutes in the sun daily.   Orders: -     VITAMIN D 25 Hydroxy (Vit-D Deficiency, Fractures)  Chronic pain of both knees Assessment & Plan: Continues to have significant pain to her knees and is requesting referral to pain management.  Orders: -     Ambulatory referral to Pain Clinic  Other long term (current) drug therapy -     CBC with Differential/Platelet  Other orders -     Simvastatin; TAKE 1 TABLET(80 MG) BY MOUTH AT BEDTIME  Dispense: 90 tablet; Refill: 1 -     valACYclovir HCl; TAKE 1/2 TABLET BY MOUTH THREE TIMES DAILY AS NEEDED FOR OUTBREAK  Dispense: 135 tablet; Refill: 2    Return for 1 year physical; 6 month f/u . Patient  was given opportunity to ask questions. Patient verbalized understanding of the plan and was able to repeat key elements of the plan. All questions were answered to their satisfaction.   Arnette Felts, FNP  I, Arnette Felts, FNP, have reviewed all documentation for this visit. The documentation on 06/25/23 for the exam, diagnosis, procedures, and orders are all accurate and complete.

## 2023-06-25 NOTE — Assessment & Plan Note (Signed)
Continues to have significant pain to her knees and is requesting referral to pain management.

## 2023-06-25 NOTE — Assessment & Plan Note (Signed)
Continue magnesium supplement 

## 2023-06-25 NOTE — Assessment & Plan Note (Signed)
Cholesterol levels are stable.  Continue statin.

## 2023-06-25 NOTE — Assessment & Plan Note (Signed)
Continue statin, tolerating well 

## 2023-06-25 NOTE — Assessment & Plan Note (Signed)
Influenza vaccine administered Encouraged to take Tylenol as needed for fever or muscle aches.

## 2023-06-26 LAB — CMP14+EGFR
ALT: 10 [IU]/L (ref 0–32)
AST: 19 [IU]/L (ref 0–40)
Albumin: 4.2 g/dL (ref 3.8–4.8)
Alkaline Phosphatase: 137 [IU]/L — ABNORMAL HIGH (ref 44–121)
BUN/Creatinine Ratio: 11 — ABNORMAL LOW (ref 12–28)
BUN: 13 mg/dL (ref 8–27)
Bilirubin Total: 0.5 mg/dL (ref 0.0–1.2)
CO2: 23 mmol/L (ref 20–29)
Calcium: 9.2 mg/dL (ref 8.7–10.3)
Chloride: 108 mmol/L — ABNORMAL HIGH (ref 96–106)
Creatinine, Ser: 1.17 mg/dL — ABNORMAL HIGH (ref 0.57–1.00)
Globulin, Total: 3 g/dL (ref 1.5–4.5)
Glucose: 88 mg/dL (ref 70–99)
Potassium: 4.8 mmol/L (ref 3.5–5.2)
Sodium: 145 mmol/L — ABNORMAL HIGH (ref 134–144)
Total Protein: 7.2 g/dL (ref 6.0–8.5)
eGFR: 47 mL/min/{1.73_m2} — ABNORMAL LOW (ref >59)

## 2023-06-26 LAB — CBC WITH DIFFERENTIAL/PLATELET
Basophils Absolute: 0.1 10*3/uL (ref 0.0–0.2)
Basos: 2 %
EOS (ABSOLUTE): 0.2 10*3/uL (ref 0.0–0.4)
Eos: 3 %
Hematocrit: 38 % (ref 34.0–46.6)
Hemoglobin: 11.4 g/dL (ref 11.1–15.9)
Immature Grans (Abs): 0 10*3/uL (ref 0.0–0.1)
Immature Granulocytes: 0 %
Lymphocytes Absolute: 3 10*3/uL (ref 0.7–3.1)
Lymphs: 35 %
MCH: 28.9 pg (ref 26.6–33.0)
MCHC: 30 g/dL — ABNORMAL LOW (ref 31.5–35.7)
MCV: 96 fL (ref 79–97)
Monocytes Absolute: 0.5 10*3/uL (ref 0.1–0.9)
Monocytes: 6 %
Neutrophils Absolute: 4.7 10*3/uL (ref 1.4–7.0)
Neutrophils: 54 %
Platelets: 337 10*3/uL (ref 150–450)
RBC: 3.95 x10E6/uL (ref 3.77–5.28)
RDW: 12.4 % (ref 11.7–15.4)
WBC: 8.5 10*3/uL (ref 3.4–10.8)

## 2023-06-26 LAB — LIPID PANEL
Chol/HDL Ratio: 2.3 ratio (ref 0.0–4.4)
Cholesterol, Total: 158 mg/dL (ref 100–199)
HDL: 68 mg/dL (ref >39)
LDL Chol Calc (NIH): 72 mg/dL (ref 0–99)
Triglycerides: 101 mg/dL (ref 0–149)
VLDL Cholesterol Cal: 18 mg/dL (ref 5–40)

## 2023-06-26 LAB — MICROALBUMIN / CREATININE URINE RATIO
Creatinine, Urine: 135.2 mg/dL
Microalb/Creat Ratio: 9 mg/g{creat} (ref 0–29)
Microalbumin, Urine: 11.6 ug/mL

## 2023-06-26 LAB — VITAMIN D 25 HYDROXY (VIT D DEFICIENCY, FRACTURES): Vit D, 25-Hydroxy: 44.5 ng/mL (ref 30.0–100.0)

## 2023-06-27 ENCOUNTER — Other Ambulatory Visit: Payer: Self-pay | Admitting: Adult Health

## 2023-07-18 ENCOUNTER — Other Ambulatory Visit: Payer: Self-pay | Admitting: Cardiology

## 2023-07-19 ENCOUNTER — Encounter: Payer: Self-pay | Admitting: Physical Medicine & Rehabilitation

## 2023-07-24 ENCOUNTER — Ambulatory Visit: Payer: 59 | Admitting: Physical Medicine & Rehabilitation

## 2023-08-10 ENCOUNTER — Encounter: Payer: 59 | Attending: Physical Medicine & Rehabilitation | Admitting: Physical Medicine & Rehabilitation

## 2023-08-10 ENCOUNTER — Encounter: Payer: Self-pay | Admitting: Physical Medicine & Rehabilitation

## 2023-08-10 VITALS — BP 138/76 | HR 72 | Ht 65.0 in | Wt 211.2 lb

## 2023-08-10 DIAGNOSIS — M25562 Pain in left knee: Secondary | ICD-10-CM | POA: Insufficient documentation

## 2023-08-10 DIAGNOSIS — Z79891 Long term (current) use of opiate analgesic: Secondary | ICD-10-CM | POA: Insufficient documentation

## 2023-08-10 DIAGNOSIS — M545 Low back pain, unspecified: Secondary | ICD-10-CM | POA: Diagnosis not present

## 2023-08-10 DIAGNOSIS — G894 Chronic pain syndrome: Secondary | ICD-10-CM | POA: Diagnosis not present

## 2023-08-10 DIAGNOSIS — M2550 Pain in unspecified joint: Secondary | ICD-10-CM | POA: Insufficient documentation

## 2023-08-10 DIAGNOSIS — Z5181 Encounter for therapeutic drug level monitoring: Secondary | ICD-10-CM | POA: Diagnosis not present

## 2023-08-10 DIAGNOSIS — M25561 Pain in right knee: Secondary | ICD-10-CM | POA: Diagnosis not present

## 2023-08-10 DIAGNOSIS — G8929 Other chronic pain: Secondary | ICD-10-CM | POA: Diagnosis not present

## 2023-08-10 NOTE — Progress Notes (Addendum)
Subjective:    Patient ID: Peggy [name], female    DOB: 1946/08/23, 77 y.o.   MRN: [id number]  HPI   HPI  Peggy [name] is a 77 y.o. year old female  who  has a past medical history of Anxiety, Arthritis, Asthma, Bladder cancer (HCC), Chest pain at rest (02/18/2015), Chronic bronchitis (HCC), Chronic lower back pain, Coronary artery disease, non-occlusive (2005), Daily headache, Deafness in left ear, Depression, DVT (deep venous thrombosis) (HCC) (2009/2010), GERD (gastroesophageal reflux disease), History of kidney stones, Hyperlipidemia with target LDL less than 100, Hypertension, Leg swelling (12/12/2017), Nummular eczema, Sciatica (12/10/2012), and Skin cancer.   They are presenting to PM&R clinic as a new patient for pain management evaluation. They were referred by Arnette Felts for treatment of knee pain.  Patient reports she had had chronic knee pain for many years.  She reports having a history of 3 knee replacements on the left knee and " 2-1/2" knee replacements on her left knee, 1 partial knee and 2 full knee replacements.  She continues to have severe pain in her knees particularly on the left side.  Pain is present all the time but worsened with ambulation.    She also reports arthritis in multiple other areas of her body including her lower back but pain in other locations is overall tolerable.  She will alternate between using a cane, walker, and wheelchair for mobility.  Exacerbating factors:  Red flag symptoms: No red flags for back pain endorsed in Hx or ROS  Medications tried: Topical medications - Voltaren, Bengay-minimal benefit Nsaids Ibuprofen- doesn't help Tylenol minimal benefit Opiates Tramadol- made her stomach upset  Hydrocodone/Oxycodone-resulted in GI upset Tylenol with codeine-she says she uses years ago, does not recall any side effects but has poor recollection of this medication Gabapentin / Lyrica  - Denies  TCAs  - Denies  SNRIs - Denies   Other  treatments: PT- Last tried many years ago  TENs unit - Denies benefit  Injections - No recent injections  Surgery b/l TKA  Prior back surgery years ago    Prior UDS results: No results found for: "LABOPIA", "COCAINSCRNUR", "LABBENZ", "AMPHETMU", "THCU", "LABBARB"     Pain Inventory Average Pain 10 Pain Right Now 10 My pain is sharp and aching  In the last 24 hours, has pain interfered with the following? General activity 9 Relation with others 0 Enjoyment of life 0 What TIME of day is your pain at its worst? daytime, evening, and night Sleep (in general) Fair  Pain is worse with: walking, bending, sitting, and standing Pain improves with:  nothing Relief from Meds: 0  use a cane use a walker how many minutes can you walk? 5-10 ability to climb steps?  no do you drive?  no  I need assistance with the following:  bathing, meal prep, household duties, and shopping  weakness trouble walking spasms  Any changes since last visit?  no  Any changes since last visit?  no    Family History  Problem Relation Age of Onset   Heart failure Mother    Cancer Mother    Hypertension Mother    Hypertension Father    Heart attack Father        Noted on cardiology intake form as "massive heart attack" at age 43   Heart failure Sister    Heart attack Sister        Not listed on her cardiology intake form   Breast cancer Sister  Heart failure Sister    Heart attack Sister        Not listed on her cardiology intake   Cancer Sister    Breast cancer Sister    Social History   Socioeconomic History   Marital status: Single    Spouse name: Not on file   Number of children: 1   Years of education: Not on file   Highest education level: Not on file  Occupational History   Occupation: Retired    Associate Professor: RETIRED    Comment: Psychiatric nurse   Tobacco Use   Smoking status: Never   Smokeless tobacco: Never  Vaping Use   Vaping status: Never Used  Substance and Sexual  Activity   Alcohol use: No   Drug use: No   Sexual activity: Not Currently  Other Topics Concern   Not on file  Social History Narrative   Relocated from [place]   She is currently "'.  She lives with her son (who is autistic).  She herself is disabled, and not working.   She never smoked and never drank alcohol.   She enjoys walking 7 days a week.   Social Determinants of Health   Financial Resource Strain: Low Risk  (06/20/2023)   Overall Financial Resource Strain (CARDIA)    Difficulty of Paying Living Expenses: Not hard at all  Food Insecurity: No Food Insecurity (06/20/2023)   Hunger Vital Sign    Worried About Running Out of Food in the Last Year: Never true    Ran Out of Food in the Last Year: Never true  Transportation Needs: No Transportation Needs (06/20/2023)   PRAPARE - Administrator, Civil Service (Medical): No    Lack of Transportation (Non-Medical): No  Physical Activity: Inactive (06/20/2023)   Exercise Vital Sign    Days of Exercise per Week: 0 days    Minutes of Exercise per Session: 0 min  Stress: No Stress Concern Present (06/20/2023)   [employer] of Occupational Health - Occupational Stress Questionnaire    Feeling of Stress : Not at all  Social Connections: Socially Isolated (06/20/2023)   Social Connection and Isolation Panel [NHANES]    Frequency of Communication with Friends and Family: More than three times a week    Frequency of Social Gatherings with Friends and Family: Twice a week    Attends Religious Services: Never    Database administrator or Organizations: No    Attends Engineer, structural: Never    Marital Status: Never married   Past Surgical History:  Procedure Laterality Date   ABDOMINAL HYSTERECTOMY  1980's   APPENDECTOMY Right    BACK SURGERY     x 2, lower   BLADDER TUMOR EXCISION     BREAST BIOPSY Right    BREAST EXCISIONAL BIOPSY Right    CAROTID ARTERY DOPPLERS   10/2017   Carotid Dopplers:  40-59% stenosis on the Right Internal Carotid, < 40% stenosis in the Left.  Both vertebral arteries show normal flow.  Left subclavian artery is normal, but there is some flow disturbance in the right subclavian artery.   CATARACT EXTRACTION Left    2024   COLONOSCOPY WITH PROPOFOL N/A 02/26/2015   Procedure: COLONOSCOPY WITH PROPOFOL;  Surgeon: Jeani Hawking, MD;  Location: [facility];  Service: Endoscopy;  Laterality: N/A;   COLONOSCOPY WITH PROPOFOL N/A 12/06/2017   Procedure: COLONOSCOPY WITH PROPOFOL;  Surgeon: Charna Elizabeth, MD;  Location: [facility];  Service: Endoscopy;  Laterality: N/A;   CORONARY CALCIUM SCORE & CT ANGIOGRAM  11/2017   Coronary calcium score 131 (moderate risk).  Moderate proximal LAD & LCx stenosis --> LOW RISK STUDY.  No evidence to suspect cardiac related chest pain --> correlates with cardiac catheterizations   ENTEROSCOPY N/A 12/14/2017   Procedure: ENTEROSCOPY;  Surgeon: Jeani Hawking, MD;  Location: [facility];  Service: Endoscopy;  Laterality: N/A;   ESOPHAGOGASTRODUODENOSCOPY Left 02/20/2015   Procedure: ESOPHAGOGASTRODUODENOSCOPY (EGD);  Surgeon: Rachael Fee, MD;  Location: [facility] ENDOSCOPY;  Service: Endoscopy;  Laterality: Left;   ESOPHAGOGASTRODUODENOSCOPY (EGD) WITH PROPOFOL N/A 12/06/2017   Procedure: ESOPHAGOGASTRODUODENOSCOPY (EGD) WITH PROPOFOL;  Surgeon: Charna Elizabeth, MD;  Location: [facility];  Service: Endoscopy;  Laterality: N/A;   HOT HEMOSTASIS N/A 12/14/2017   Procedure: HOT HEMOSTASIS (ARGON PLASMA COAGULATION/BICAP);  Surgeon: Jeani Hawking, MD;  Location: [facility];  Service: Endoscopy;  Laterality: N/A;   JOINT REPLACEMENT     KNEE ARTHROSCOPY Right ~ 2000   LEFT HEART CATH AND CORONARY ANGIOGRAPHY  11/'05; 2/'12   Angiographically normal coronary arteries   LUMBAR DISC SURGERY  1980's X 1; ~ 2002   MEDIAL PARTIAL KNEE REPLACEMENT Right 2001   NM MYOVIEW LTD  02/2015   EF 65%.  No reversible ischemia.  Possible scar along the  cardiac apex and anteroseptal wall.  (Mild severity, medium-sized defect in the apex anteroseptal wall noted on both resting and stress images).  No inducible ischemia.  Read as LOW RISK   NM MYOVIEW LTD  12/09/2020   LOW RISK.  EF 55 to 65%.  No ischemia or infarct.   PARTIAL KNEE ARTHROPLASTY Left 2009   REPLACEMENT TOTAL KNEE BILATERAL Bilateral    REVISION TOTAL KNEE ARTHROPLASTY Left    SKIN CANCER EXCISION Right    "abdomen"   TRANSTHORACIC ECHOCARDIOGRAM  06/'16; 2/'19; 1/'21    A) mod Conc LVH. EF 60-65%.  No RWMA. GR 1 D. Mild Ao Stenosis. Mild LA dilation.;  B) EF 60-65%.  Mild LVH.  No or W MA.  No significant valve disease.;l 01/2020: Normal LV systolic function.  Mild LVH.  GR 1 DD.  EF 60 to 65%.  No R WMA.  Trace AI.  No AS   Past Medical History:  Diagnosis Date   Anxiety    Arthritis    "all over"   Asthma    Bladder cancer (HCC)    Chest pain at rest 02/18/2015   Chronic bronchitis [facility])    'get it q yr"   Chronic lower back pain    Coronary artery disease, non-occlusive 2005   Cardiac catheter 2005 and 2012 for "anginal symptoms "showed angiographically normal coronary arteries. -->  Negative Myoview June 2016.  Coronary calcification noted on chest CT -> calcium score 131, moderate proximal LAD and LCx disease on coronary CTA - (2019)   Daily headache    Deafness in left ear    Depression    DVT (deep venous thrombosis) (HCC) 2009/2010   "probably left leg"    GERD (gastroesophageal reflux disease)    History of kidney stones    Hyperlipidemia with target LDL less than 100    Hypertension    Leg swelling 12/12/2017   Nummular eczema    Sciatica 12/10/2012   Skin cancer    "right abdomen"   BP 138/76   Pulse 72   Ht 5\' 5"  (1.651 m)   Wt 211 lb 3.2 oz (95.8 kg)   SpO2 98%   BMI  35.15 kg/m   Opioid Risk Score:   Fall Risk Score:  `1  Depression screen [facility] 2/9     08/10/2023   11:24 AM 06/20/2023    8:28 AM 10/16/2022    9:49 AM 06/01/2022    9:20 AM  04/12/2022    8:45 AM 05/26/2021   10:33 AM 08/18/2020    9:39 AM  Depression screen PHQ 2/9  Decreased Interest 0 0 0 0 0 0 0  Down, Depressed, Hopeless 0 0 0 0 0 0 0  PHQ - 2 Score 0 0 0 0 0 0 0  Altered sleeping 3 0       Tired, decreased energy 0 0       Change in appetite 2 0       Feeling bad or failure about yourself  0 0       Trouble concentrating 0 0       Moving slowly or fidgety/restless 0 0       Suicidal thoughts  0       PHQ-9 Score 5 0       Difficult doing work/chores  Not difficult at all         Review of Systems  Constitutional:  Positive for appetite change.       Poor  HENT: Negative.    Eyes: Negative.   Respiratory: Negative.    Cardiovascular: Negative.   Gastrointestinal: Negative.   Endocrine: Negative.   Genitourinary: Negative.   Musculoskeletal:  Positive for gait problem and joint swelling.  Skin: Negative.   Allergic/Immunologic: Negative.   Neurological:  Positive for weakness.  Hematological: Negative.   Psychiatric/Behavioral: Negative.    All other systems reviewed and are negative.      Objective:   Physical Exam  Gen: no distress, normal appearing HEENT: oral mucosa pink and moist, NCAT Chest: normal effort, normal rate of breathing Abd: soft, non-distended Ext: no edema Psych: pleasant, normal affect Skin: intact Neuro: Alert and awake, follows commands, cranial nerves II through XII grossly intact, normal speech and language RUE: 5/5 Deltoid, 5/5 Biceps, 5/5 Triceps, 5/5 Wrist Ext, 5/5 Grip LUE: 5/5 Deltoid, 5/5 Biceps, 5/5 Triceps, 5/5 Wrist Ext, 5/5 Grip RLE: HF 4/5, KE 4/5, ADF 4/5, APF 4/5 LLE: HF 4-/5, KE 4-/5, ADF 4/5, APF 4/5 Sensory exam normal for light touch and pain in all 4 limbs.  No abnormal tone noted Musculoskeletal:   B/L knee joint tenderness left greater than right Mild TTP lumbar spine Pain with passive ROM b/l knees B/l TKA incision scars- healed Mild diffuse tenderness throughout b/l UE , hips,  ankles Mild TTP C spine     Assessment & Plan:   Assessment 1) B/L knee pain s/p b/l Knee TKA 2) Chronic lower back pain  3) Polyarthralgia   Plan -UDS and pain agreement today -Will consider trying tylenol #3 BID PRN after UDS results  -Exercises for b/l Knees provided-advised to complete three times a week  -Consider Genicular nerve blocks   08/22/23 called to f/u UDS, no answer, left discrete VM

## 2023-08-15 ENCOUNTER — Other Ambulatory Visit: Payer: Self-pay

## 2023-08-15 ENCOUNTER — Telehealth: Payer: Self-pay | Admitting: Cardiology

## 2023-08-15 MED ORDER — AMLODIPINE BESYLATE 2.5 MG PO TABS: 2.5000 mg | ORAL_TABLET | Freq: Every day | ORAL | 3 refills | Status: DC

## 2023-08-15 MED ORDER — EZETIMIBE 10 MG PO TABS: 10.0000 mg | ORAL_TABLET | Freq: Every day | ORAL | 3 refills | Status: DC

## 2023-08-15 MED ORDER — METOPROLOL SUCCINATE ER 25 MG PO TB24: 25.0000 mg | ORAL_TABLET | Freq: Every day | ORAL | 0 refills | Status: DC

## 2023-08-15 MED ORDER — FUROSEMIDE 40 MG PO TABS: 40.0000 mg | ORAL_TABLET | Freq: Every day | ORAL | 3 refills | Status: DC

## 2023-08-15 NOTE — Telephone Encounter (Signed)
*  STAT* If patient is at the pharmacy, call can be transferred to refill team.   1. Which medications need to be refilled? (please list name of each medication and dose if known) amLODipine (NORVASC) 2.5 MG tablet   ezetimibe (ZETIA) 10 MG tablet    furosemide (LASIX) 40 MG tablet    metoprolol succinate (TOPROL-XL) 25 MG 24 hr tablet     2. Which pharmacy/location (including street and city if local pharmacy) is medication to be sent to? [facility] [phone] - [place], [place] - [address]   3. Do they need a 30 day or 90 day supply? 90

## 2023-08-16 LAB — TOXASSURE SELECT,+ANTIDEPR,UR

## 2023-08-22 ENCOUNTER — Telehealth: Payer: Self-pay | Admitting: Physical Medicine & Rehabilitation

## 2023-08-22 NOTE — Telephone Encounter (Signed)
Called, left discrete VM to f/u on prior visit

## 2023-09-10 ENCOUNTER — Encounter: Payer: 59 | Admitting: Physical Medicine & Rehabilitation

## 2023-09-11 ENCOUNTER — Telehealth: Payer: Self-pay | Admitting: Physical Medicine & Rehabilitation

## 2023-09-11 NOTE — Telephone Encounter (Signed)
 Called today 09/11/23 f/u on prior visit plan, discrete VM left

## 2023-09-20 ENCOUNTER — Encounter (HOSPITAL_COMMUNITY): Payer: Self-pay

## 2023-09-20 ENCOUNTER — Ambulatory Visit (HOSPITAL_COMMUNITY)
Admission: EM | Admit: 2023-09-20 | Discharge: 2023-09-20 | Disposition: A | Discharge status: Home / Self Care | Payer: 59

## 2023-09-20 DIAGNOSIS — R040 Epistaxis: Secondary | ICD-10-CM | POA: Diagnosis not present

## 2023-09-20 NOTE — ED Provider Notes (Signed)
[facility]    CSN: [id number] Arrival date & time: 09/20/23  1349      History   Chief Complaint Chief Complaint  Patient presents with   Epistaxis    HPI Peggy [name] is a 78 y.o. female who presents due to developing R nose bleed 1 hour ago and though she was pinching her nose( at the nose bridge area) the blood kept on dripping in the sink. She denies HA. Admits her nose was itching and she was itching it before the bleeding started. While in the lobby the bleeding stopped and has not reoccurred. She states she did take her BP med this am.       Past Medical History:  Diagnosis Date   Anxiety    Arthritis    "all over"   Asthma    Bladder cancer (HCC)    Chest pain at rest 02/18/2015   Chronic bronchitis [facility])    'get it q yr"   Chronic lower back pain    Coronary artery disease, non-occlusive 2005   Cardiac catheter 2005 and 2012 for "anginal symptoms "showed angiographically normal coronary arteries. -->  Negative Myoview June 2016.  Coronary calcification noted on chest CT -> calcium score 131, moderate proximal LAD and LCx disease on coronary CTA - (2019)   Daily headache    Deafness in left ear    Depression    DVT (deep venous thrombosis) (HCC) 2009/2010   "probably left leg"    GERD (gastroesophageal reflux disease)    History of kidney stones    Hyperlipidemia with target LDL less than 100    Hypertension    Leg swelling 12/12/2017   Nummular eczema    Sciatica 12/10/2012   Skin cancer    "right abdomen"    Patient Active Problem List   Diagnosis Date Noted   Mixed hyperlipidemia 06/25/2023   Encounter for annual health examination 06/25/2023   Class 1 obesity due to excess calories with serious comorbidity and body mass index (BMI) of 34.0 to 34.9 in adult 06/25/2023   Muscle cramps 06/25/2023   Vitamin D deficiency 06/25/2023   Chronic pain of both knees 06/25/2023   Need for influenza vaccination 06/25/2023   Orthopnea 06/24/2023    Bilateral lower extremity edema 06/24/2023   Health care maintenance 06/24/2023   Abdominal bloating 10/16/2022   Constipation 10/16/2022   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   Diverticulosis of colon 10/16/2022   Gastroesophageal reflux disease 10/16/2022   History of colonic polyps 10/16/2022   Rectal bleeding 10/16/2022   Epigastric pain 10/16/2022   Periumbilical pain 10/16/2022   Atherosclerosis of aorta (HCC) 10/16/2022   Preop cardiovascular exam 06/05/2021   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   PSVT (paroxysmal supraventricular tachycardia) (HCC) 04/29/2020   Palpitations 03/17/2020   Chronic pain of left knee 12/11/2018   Chest pain with low risk for cardiac etiology 10/11/2017   Systolic ejection murmur 10/11/2017   Bilateral carotid bruits 10/11/2017   Coronary artery calcification seen on CAT scan 10/11/2017   Anemia, iron deficiency 02/20/2015   Musculoskeletal chest pain 02/20/2015   Hyperlipidemia with target LDL less than 100 02/18/2015   Back pain, lumbosacral 12/10/2012   Essential hypertension 12/10/2012   Spinal stenosis 12/10/2012   DDD (degenerative disc disease), lumbar 12/10/2012   Coronary artery disease, non-occlusive 2005    Past Surgical History:  Procedure Laterality Date  ABDOMINAL HYSTERECTOMY  1980's   APPENDECTOMY Right    BACK SURGERY     x 2, lower   BLADDER TUMOR EXCISION     BREAST BIOPSY Right    BREAST EXCISIONAL BIOPSY Right    CAROTID ARTERY DOPPLERS   10/2017   Carotid Dopplers: 40-59% stenosis on the Right Internal Carotid, < 40% stenosis in the Left.  Both vertebral arteries show normal flow.  Left subclavian artery is normal, but there is some flow disturbance in the right subclavian artery.   CATARACT EXTRACTION Left    2024   COLONOSCOPY WITH PROPOFOL N/A 02/26/2015   Procedure: COLONOSCOPY WITH PROPOFOL;  Surgeon:  Jeani Hawking, MD;  Location: [facility];  Service: Endoscopy;  Laterality: N/A;   COLONOSCOPY WITH PROPOFOL N/A 12/06/2017   Procedure: COLONOSCOPY WITH PROPOFOL;  Surgeon: Charna Elizabeth, MD;  Location: [facility];  Service: Endoscopy;  Laterality: N/A;   CORONARY CALCIUM SCORE & CT ANGIOGRAM  11/2017   Coronary calcium score 131 (moderate risk).  Moderate proximal LAD & LCx stenosis --> LOW RISK STUDY.  No evidence to suspect cardiac related chest pain --> correlates with cardiac catheterizations   ENTEROSCOPY N/A 12/14/2017   Procedure: ENTEROSCOPY;  Surgeon: Jeani Hawking, MD;  Location: [facility];  Service: Endoscopy;  Laterality: N/A;   ESOPHAGOGASTRODUODENOSCOPY Left 02/20/2015   Procedure: ESOPHAGOGASTRODUODENOSCOPY (EGD);  Surgeon: Rachael Fee, MD;  Location: [facility] END[facility]COPY;  Service: Endoscopy;  Laterality: Left;   ESOPHAGOGASTRODUODENOSCOPY (EGD) WITH PROPOFOL N/A 12/06/2017   Procedure: ESOPHAGOGASTRODUODENOSCOPY (EGD) WITH PROPOFOL;  Surgeon: Charna Elizabeth, MD;  Location: [facility];  Service: Endoscopy;  Laterality: N/A;   HOT HEMOSTASIS N/A 12/14/2017   Procedure: HOT HEMOSTASIS (ARGON PLASMA COAGULATION/BICAP);  Surgeon: Jeani Hawking, MD;  Location: [facility];  Service: Endoscopy;  Laterality: N/A;   JOINT REPLACEMENT     KNEE ARTHROSCOPY Right ~ 2000   LEFT HEART CATH AND CORONARY ANGIOGRAPHY  11/'05; 2/'12   Angiographically normal coronary arteries   LUMBAR DISC SURGERY  1980's X 1; ~ 2002   MEDIAL PARTIAL KNEE REPLACEMENT Right 2001   NM MYOVIEW LTD  02/2015   EF 65%.  No reversible ischemia.  Possible scar along the cardiac apex and anteroseptal wall.  (Mild severity, medium-sized defect in the apex anteroseptal wall noted on both resting and stress images).  No inducible ischemia.  Read as LOW RISK   NM MYOVIEW LTD  12/09/2020   LOW RISK.  EF 55 to 65%.  No ischemia or infarct.   PARTIAL KNEE ARTHROPLASTY Left 2009   REPLACEMENT TOTAL KNEE BILATERAL Bilateral     REVISION TOTAL KNEE ARTHROPLASTY Left    SKIN CANCER EXCISION Right    "abdomen"   TRANSTHORACIC ECHOCARDIOGRAM  06/'16; 2/'19; 1/'21    A) mod Conc LVH. EF 60-65%.  No RWMA. GR 1 D. Mild Ao Stenosis. Mild LA dilation.;  B) EF 60-65%.  Mild LVH.  No or W MA.  No significant valve disease.;l 01/2020: Normal LV systolic function.  Mild LVH.  GR 1 DD.  EF 60 to 65%.  No R WMA.  Trace AI.  No AS    OB History   No obstetric history on file.      Home Medications    Prior to Admission medications   Medication Sig Start Date End Date Taking? Authorizing Provider  acetaminophen (TYLENOL) 500 MG tablet Take 1,000 mg by mouth 2 (two) times daily as needed for moderate pain or headache.     [provider]  amLODipine (NORVASC) 2.5 MG tablet Take 1 tablet (2.5 mg total) by mouth daily. 08/15/23   Marykay Lex, MD  diazepam (VALIUM) 5 MG tablet Take 5 mg by mouth every 6 (six) hours as needed. 04/04/22   [provider]  diphenhydrAMINE (BENADRYL) 25 mg capsule Take 25 mg by mouth daily as needed for allergies.    [provider]  ezetimibe (ZETIA) 10 MG tablet Take 1 tablet (10 mg total) by mouth daily. 08/15/23   Marykay Lex, MD  FLUoxetine (PROZAC) 40 MG capsule Take 40 mg by mouth daily as needed (depression).    [provider]  furosemide (LASIX) 40 MG tablet Take 1 tablet (40 mg total) by mouth daily. 08/15/23   Marykay Lex, MD  Magnesium 250 MG TABS Take 1 tablet (250 mg total) by mouth every evening. Take with evening meal 06/25/23   Arnette Felts, FNP  metoprolol succinate (TOPROL-XL) 25 MG 24 hr tablet Take 1 tablet (25 mg total) by mouth daily. 08/15/23   Marykay Lex, MD  nitroGLYCERIN (NITROSTAT) 0.4 MG SL tablet Place 1 tablet (0.4 mg total) under the tongue every 5 (five) minutes as needed for chest pain. 05/25/22 02/05/23  Ronney Asters, NP  Potassium 99 MG TABS Take 99 mg by mouth daily. Take 1 tablet by mouth daily.     [provider]  simvastatin (ZOCOR) 80 MG tablet TAKE 1 TABLET(80 MG) BY MOUTH AT BEDTIME 06/25/23   Arnette Felts, FNP  trandolapril (MAVIK) 2 MG tablet Take 1 tablet (2 mg total) by mouth daily. 06/25/23   Arnette Felts, FNP  valACYclovir (VALTREX) 1000 MG tablet TAKE 1/2 TABLET BY MOUTH THREE TIMES DAILY AS NEEDED FOR OUTBREAK 06/25/23   Arnette Felts, FNP  Vitamin D, Ergocalciferol, (DRISDOL) 1.25 MG (50000 UNIT) CAPS capsule TAKE 1 CAPSULE BY MOUTH 2 TIMES A WEEK 08/08/22   Arnette Felts, FNP    Family History Family History  Problem Relation Age of Onset   Heart failure Mother    Cancer Mother    Hypertension Mother    Hypertension Father    Heart attack Father        Noted on cardiology intake form as "massive heart attack" at age 32   Heart failure Sister    Heart attack Sister        Not listed on her cardiology intake form   Breast cancer Sister    Heart failure Sister    Heart attack Sister        Not listed on her cardiology intake   Cancer Sister    Breast cancer Sister     Social History Social History   Tobacco Use   Smoking status: Never   Smokeless tobacco: Never  Vaping Use   Vaping status: Never Used  Substance Use Topics   Alcohol use: No   Drug use: No     Allergies   Penicillins, Azithromycin, Levaquin [levofloxacin in d5w], Clindamycin/lincomycin, Erythromycin base, Keflex [cephalexin], Sulfa antibiotics, and Tetracyclines & related   Review of Systems Review of Systems As noted in HPI  Physical Exam Triage Vital Signs ED Triage Vitals [09/20/23 1355]  Encounter Vitals Group     BP (!) 189/90     Systolic BP Percentile      Diastolic BP Percentile      Pulse Rate (!) 103     Resp 18     Temp 98.6 F (37 C)     Temp  src      SpO2 100 %     Weight      Height      Head Circumference      Peak Flow      Pain Score 0     Pain Loc      Pain Education      Exclude from Growth Chart    No data found.  Updated Vital  Signs BP (!) 189/90 (BP Location: Right Arm)   Pulse (!) 103   Temp 98.6 F (37 C)   Resp 18   SpO2 100%  Repeated BP 156/82 Visual Acuity Right Eye Distance:   Left Eye Distance:   Bilateral Distance:    Right Eye Near:   Left Eye Near:    Bilateral Near:     Physical Exam Vitals and nursing note reviewed.  Constitutional:      General: She is not in acute distress.    Appearance: She is obese. She is not toxic-appearing.  HENT:     Right Ear: External ear normal.     Left Ear: External ear normal.     Nose:     Comments: There is no active area of bleeding present. Has some dryness on anterior R septum. No polyps or growths noted on either nostril. Has a little dry blood in the outer R nostril.  Eyes:     Conjunctiva/sclera: Conjunctivae normal.  Pulmonary:     Effort: Pulmonary effort is normal.  Musculoskeletal:     Cervical back: Neck supple.  Skin:    General: Skin is warm and dry.  Neurological:     Mental Status: She is alert and oriented to person, place, and time.     Gait: Gait normal.  Psychiatric:        Mood and Affect: Mood normal.        Behavior: Behavior normal.        Thought Content: Thought content normal.        Judgment: Judgment normal.      UC Treatments / Results  Labs (all labs ordered are listed, but only abnormal results are displayed) Labs Reviewed - No data to display  EKG   Radiology No results found.  Procedures Procedures (including critical care time)  Medications Ordered in UC Medications - No data to display  Initial Impression / Assessment and Plan / UC Course  I have reviewed the triage vital signs and the nursing notes.  Epistaxis resolved Itching nose  I advised her to use Ayr saline nose get into her nostrils 2-3 times a day for prevention I educated how to pinch her nose correctly when is bleeding and to hold it for 5-10 minutes to help it stop if the bleeding returns.  Final Clinical Impressions(s) /  UC Diagnoses   Final diagnoses:  Epistaxis     Discharge Instructions      Get Ayr saline nose gel and apply with a Q-tip into your nostrils 2-3 times a day to keep it moist      ED Prescriptions   None    PDMP not reviewed this encounter.   Garey Ham, [place] 09/20/23 1543

## 2023-09-20 NOTE — ED Triage Notes (Signed)
Pt c/o rt nare bleeding x1 hr. Active bleeding.

## 2023-09-20 NOTE — Discharge Instructions (Signed)
Get Ayr saline nose gel and apply with a Q-tip into your nostrils 2-3 times a day to keep it moist

## 2023-09-21 ENCOUNTER — Encounter: Payer: Self-pay | Admitting: Physical Medicine & Rehabilitation

## 2023-09-21 ENCOUNTER — Other Ambulatory Visit: Payer: Self-pay | Admitting: Nurse Practitioner

## 2023-09-21 ENCOUNTER — Encounter: Payer: 59 | Attending: Physical Medicine & Rehabilitation | Admitting: Physical Medicine & Rehabilitation

## 2023-09-21 VITALS — BP 132/74 | HR 83 | Ht 65.0 in | Wt 213.0 lb

## 2023-09-21 DIAGNOSIS — M545 Low back pain, unspecified: Secondary | ICD-10-CM | POA: Diagnosis not present

## 2023-09-21 DIAGNOSIS — G8929 Other chronic pain: Secondary | ICD-10-CM | POA: Diagnosis not present

## 2023-09-21 DIAGNOSIS — M25562 Pain in left knee: Secondary | ICD-10-CM | POA: Diagnosis not present

## 2023-09-21 DIAGNOSIS — M25561 Pain in right knee: Secondary | ICD-10-CM | POA: Insufficient documentation

## 2023-09-21 DIAGNOSIS — M2550 Pain in unspecified joint: Secondary | ICD-10-CM | POA: Diagnosis not present

## 2023-09-21 DIAGNOSIS — Z5181 Encounter for therapeutic drug level monitoring: Secondary | ICD-10-CM | POA: Diagnosis not present

## 2023-09-21 MED ORDER — ACETAMINOPHEN-CODEINE 300-30 MG PO TABS: 1.0000 | ORAL_TABLET | Freq: Two times a day (BID) | ORAL | 0 refills | Status: DC | PRN

## 2023-09-21 NOTE — Progress Notes (Signed)
Subjective:    Patient ID: Peggy [name], female    DOB: October 08, 1945, 78 y.o.   MRN: [id number]  HPI  HPI   Peggy [name] is a 78 y.o. year old female  who  has a past medical history of Anxiety, Arthritis, Asthma, Bladder cancer (HCC), Chest pain at rest (02/18/2015), Chronic bronchitis (HCC), Chronic lower back pain, Coronary artery disease, non-occlusive (2005), Daily headache, Deafness in left ear, Depression, DVT (deep venous thrombosis) (HCC) (2009/2010), GERD (gastroesophageal reflux disease), History of kidney stones, Hyperlipidemia with target LDL less than 100, Hypertension, Leg swelling (12/12/2017), Nummular eczema, Sciatica (12/10/2012), and Skin cancer.   They are presenting to PM&R clinic as a new patient for pain management evaluation. They were referred by Arnette Felts for treatment of knee pain.  Patient reports she had had chronic knee pain for many years.  She reports having a history of 3 knee replacements on the left knee and " 2-1/2" knee replacements on her left knee, 1 partial knee and 2 full knee replacements.  She continues to have severe pain in her knees particularly on the left side.  Pain is present all the time but worsened with ambulation.     She also reports arthritis in multiple other areas of her body including her lower back but pain in other locations is overall tolerable.   She will alternate between using a cane, walker, and wheelchair for mobility.   Exacerbating factors:  Red flag symptoms: No red flags for back pain endorsed in Hx or ROS   Medications tried: Topical medications - Voltaren, Bengay-minimal benefit Nsaids Ibuprofen- doesn't help Tylenol minimal benefit Opiates Tramadol- made her stomach upset  Hydrocodone/Oxycodone-resulted in GI upset Tylenol with codeine-she says she uses years ago, does not recall any side effects but has poor recollection of this medication Gabapentin / Lyrica  - Denies  TCAs  - Denies  SNRIs - Denies    Other  treatments: PT- Last tried many years ago  TENs unit - Denies benefit  Injections - No recent injections  Surgery b/l TKA  Prior back surgery years ago   Interval History 09/20/22 Patient is here for follow-up regarding her chronic knee and lower back pain.  Pain overall unchanged from prior visit.  Pain continues make ambulation and activities more difficult.  He has recently been worsened by cold weather.  Pain Inventory Average Pain 10 Pain Right Now 10 My pain is constant, sharp, burning, and aching  In the last 24 hours, has pain interfered with the following? General activity 9 Relation with others 0 Enjoyment of life 0 What TIME of day is your pain at its worst? morning , daytime, evening, and night Sleep (in general) Fair  Pain is worse with: walking, sitting, standing, and some activites Pain improves with:  nothing Relief from Meds:  na  Family History  Problem Relation Age of Onset   Heart failure Mother    Cancer Mother    Hypertension Mother    Hypertension Father    Heart attack Father        Noted on cardiology intake form as "massive heart attack" at age 80   Heart failure Sister    Heart attack Sister        Not listed on her cardiology intake form   Breast cancer Sister    Heart failure Sister    Heart attack Sister        Not listed on her cardiology intake   Cancer  Sister    Breast cancer Sister    Social History   Socioeconomic History   Marital status: Single    Spouse name: Not on file   Number of children: 1   Years of education: Not on file   Highest education level: Not on file  Occupational History   Occupation: Retired    Associate Professor: RETIRED    Comment: Psychiatric nurse   Tobacco Use   Smoking status: Never   Smokeless tobacco: Never  Vaping Use   Vaping status: Never Used  Substance and Sexual Activity   Alcohol use: No   Drug use: No   Sexual activity: Not Currently  Other Topics Concern   Not on file  Social History  Narrative   Relocated from [place]   She is currently "'.  She lives with her son (who is autistic).  She herself is disabled, and not working.   She never smoked and never drank alcohol.   She enjoys walking 7 days a week.   Social Drivers of Corporate investment banker Strain: Low Risk  (06/20/2023)   Overall Financial Resource Strain (CARDIA)    Difficulty of Paying Living Expenses: Not hard at all  Food Insecurity: No Food Insecurity (06/20/2023)   Hunger Vital Sign    Worried About Running Out of Food in the Last Year: Never true    Ran Out of Food in the Last Year: Never true  Transportation Needs: No Transportation Needs (06/20/2023)   PRAPARE - Administrator, Civil Service (Medical): No    Lack of Transportation (Non-Medical): No  Physical Activity: Inactive (06/20/2023)   Exercise Vital Sign    Days of Exercise per Week: 0 days    Minutes of Exercise per Session: 0 min  Stress: No Stress Concern Present (06/20/2023)   [employer] of Occupational Health - Occupational Stress Questionnaire    Feeling of Stress : Not at all  Social Connections: Socially Isolated (06/20/2023)   Social Connection and Isolation Panel [NHANES]    Frequency of Communication with Friends and Family: More than three times a week    Frequency of Social Gatherings with Friends and Family: Twice a week    Attends Religious Services: Never    Database administrator or Organizations: No    Attends Engineer, structural: Never    Marital Status: Never married   Past Surgical History:  Procedure Laterality Date   ABDOMINAL HYSTERECTOMY  1980's   APPENDECTOMY Right    BACK SURGERY     x 2, lower   BLADDER TUMOR EXCISION     BREAST BIOPSY Right    BREAST EXCISIONAL BIOPSY Right    CAROTID ARTERY DOPPLERS   10/2017   Carotid Dopplers: 40-59% stenosis on the Right Internal Carotid, < 40% stenosis in the Left.  Both vertebral arteries show normal flow.  Left subclavian  artery is normal, but there is some flow disturbance in the right subclavian artery.   CATARACT EXTRACTION Left    2024   COLONOSCOPY WITH PROPOFOL N/A 02/26/2015   Procedure: COLONOSCOPY WITH PROPOFOL;  Surgeon: Jeani Hawking, MD;  Location: [facility];  Service: Endoscopy;  Laterality: N/A;   COLONOSCOPY WITH PROPOFOL N/A 12/06/2017   Procedure: COLONOSCOPY WITH PROPOFOL;  Surgeon: Charna Elizabeth, MD;  Location: [facility];  Service: Endoscopy;  Laterality: N/A;   CORONARY CALCIUM SCORE & CT ANGIOGRAM  11/2017   Coronary calcium score 131 (moderate risk).  Moderate proximal LAD &  LCx stenosis --> LOW RISK STUDY.  No evidence to suspect cardiac related chest pain --> correlates with cardiac catheterizations   ENTEROSCOPY N/A 12/14/2017   Procedure: ENTEROSCOPY;  Surgeon: Jeani Hawking, MD;  Location: [facility];  Service: Endoscopy;  Laterality: N/A;   ESOPHAGOGASTRODUODENOSCOPY Left 02/20/2015   Procedure: ESOPHAGOGASTRODUODENOSCOPY (EGD);  Surgeon: Rachael Fee, MD;  Location: [facility] ENDOSCOPY;  Service: Endoscopy;  Laterality: Left;   ESOPHAGOGASTRODUODENOSCOPY (EGD) WITH PROPOFOL N/A 12/06/2017   Procedure: ESOPHAGOGASTRODUODENOSCOPY (EGD) WITH PROPOFOL;  Surgeon: Charna Elizabeth, MD;  Location: [facility];  Service: Endoscopy;  Laterality: N/A;   HOT HEMOSTASIS N/A 12/14/2017   Procedure: HOT HEMOSTASIS (ARGON PLASMA COAGULATION/BICAP);  Surgeon: Jeani Hawking, MD;  Location: [facility];  Service: Endoscopy;  Laterality: N/A;   JOINT REPLACEMENT     KNEE ARTHROSCOPY Right ~ 2000   LEFT HEART CATH AND CORONARY ANGIOGRAPHY  11/'05; 2/'12   Angiographically normal coronary arteries   LUMBAR DISC SURGERY  1980's X 1; ~ 2002   MEDIAL PARTIAL KNEE REPLACEMENT Right 2001   NM MYOVIEW LTD  02/2015   EF 65%.  No reversible ischemia.  Possible scar along the cardiac apex and anteroseptal wall.  (Mild severity, medium-sized defect in the apex anteroseptal wall noted on both resting and stress  images).  No inducible ischemia.  Read as LOW RISK   NM MYOVIEW LTD  12/09/2020   LOW RISK.  EF 55 to 65%.  No ischemia or infarct.   PARTIAL KNEE ARTHROPLASTY Left 2009   REPLACEMENT TOTAL KNEE BILATERAL Bilateral    REVISION TOTAL KNEE ARTHROPLASTY Left    SKIN CANCER EXCISION Right    "abdomen"   TRANSTHORACIC ECHOCARDIOGRAM  06/'16; 2/'19; 1/'21    A) mod Conc LVH. EF 60-65%.  No RWMA. GR 1 D. Mild Ao Stenosis. Mild LA dilation.;  B) EF 60-65%.  Mild LVH.  No or W MA.  No significant valve disease.;l 01/2020: Normal LV systolic function.  Mild LVH.  GR 1 DD.  EF 60 to 65%.  No R WMA.  Trace AI.  No AS   Past Surgical History:  Procedure Laterality Date   ABDOMINAL HYSTERECTOMY  1980's   APPENDECTOMY Right    BACK SURGERY     x 2, lower   BLADDER TUMOR EXCISION     BREAST BIOPSY Right    BREAST EXCISIONAL BIOPSY Right    CAROTID ARTERY DOPPLERS   10/2017   Carotid Dopplers: 40-59% stenosis on the Right Internal Carotid, < 40% stenosis in the Left.  Both vertebral arteries show normal flow.  Left subclavian artery is normal, but there is some flow disturbance in the right subclavian artery.   CATARACT EXTRACTION Left    2024   COLONOSCOPY WITH PROPOFOL N/A 02/26/2015   Procedure: COLONOSCOPY WITH PROPOFOL;  Surgeon: Jeani Hawking, MD;  Location: [facility];  Service: Endoscopy;  Laterality: N/A;   COLONOSCOPY WITH PROPOFOL N/A 12/06/2017   Procedure: COLONOSCOPY WITH PROPOFOL;  Surgeon: Charna Elizabeth, MD;  Location: [facility];  Service: Endoscopy;  Laterality: N/A;   CORONARY CALCIUM SCORE & CT ANGIOGRAM  11/2017   Coronary calcium score 131 (moderate risk).  Moderate proximal LAD & LCx stenosis --> LOW RISK STUDY.  No evidence to suspect cardiac related chest pain --> correlates with cardiac catheterizations   ENTEROSCOPY N/A 12/14/2017   Procedure: ENTEROSCOPY;  Surgeon: Jeani Hawking, MD;  Location: [facility];  Service: Endoscopy;  Laterality: N/A;    ESOPHAGOGASTRODUODENOSCOPY Left 02/20/2015   Procedure: ESOPHAGOGASTRODUODENOSCOPY (EGD);  Surgeon: Rachael Fee, MD;  Location: [facility] ENDOSCOPY;  Service: Endoscopy;  Laterality: Left;   ESOPHAGOGASTRODUODENOSCOPY (EGD) WITH PROPOFOL N/A 12/06/2017   Procedure: ESOPHAGOGASTRODUODENOSCOPY (EGD) WITH PROPOFOL;  Surgeon: Charna Elizabeth, MD;  Location: [facility];  Service: Endoscopy;  Laterality: N/A;   HOT HEMOSTASIS N/A 12/14/2017   Procedure: HOT HEMOSTASIS (ARGON PLASMA COAGULATION/BICAP);  Surgeon: Jeani Hawking, MD;  Location: [facility];  Service: Endoscopy;  Laterality: N/A;   JOINT REPLACEMENT     KNEE ARTHROSCOPY Right ~ 2000   LEFT HEART CATH AND CORONARY ANGIOGRAPHY  11/'05; 2/'12   Angiographically normal coronary arteries   LUMBAR DISC SURGERY  1980's X 1; ~ 2002   MEDIAL PARTIAL KNEE REPLACEMENT Right 2001   NM MYOVIEW LTD  02/2015   EF 65%.  No reversible ischemia.  Possible scar along the cardiac apex and anteroseptal wall.  (Mild severity, medium-sized defect in the apex anteroseptal wall noted on both resting and stress images).  No inducible ischemia.  Read as LOW RISK   NM MYOVIEW LTD  12/09/2020   LOW RISK.  EF 55 to 65%.  No ischemia or infarct.   PARTIAL KNEE ARTHROPLASTY Left 2009   REPLACEMENT TOTAL KNEE BILATERAL Bilateral    REVISION TOTAL KNEE ARTHROPLASTY Left    SKIN CANCER EXCISION Right    "abdomen"   TRANSTHORACIC ECHOCARDIOGRAM  06/'16; 2/'19; 1/'21    A) mod Conc LVH. EF 60-65%.  No RWMA. GR 1 D. Mild Ao Stenosis. Mild LA dilation.;  B) EF 60-65%.  Mild LVH.  No or W MA.  No significant valve disease.;l 01/2020: Normal LV systolic function.  Mild LVH.  GR 1 DD.  EF 60 to 65%.  No R WMA.  Trace AI.  No AS   Past Medical History:  Diagnosis Date   Anxiety    Arthritis    "all over"   Asthma    Bladder cancer (HCC)    Chest pain at rest 02/18/2015   Chronic bronchitis [facility])    'get it q yr"   Chronic lower back pain    Coronary artery disease,  non-occlusive 2005   Cardiac catheter 2005 and 2012 for "anginal symptoms "showed angiographically normal coronary arteries. -->  Negative Myoview June 2016.  Coronary calcification noted on chest CT -> calcium score 131, moderate proximal LAD and LCx disease on coronary CTA - (2019)   Daily headache    Deafness in left ear    Depression    DVT (deep venous thrombosis) (HCC) 2009/2010   "probably left leg"    GERD (gastroesophageal reflux disease)    History of kidney stones    Hyperlipidemia with target LDL less than 100    Hypertension    Leg swelling 12/12/2017   Nummular eczema    Sciatica 12/10/2012   Skin cancer    "right abdomen"   BP 132/74   Pulse 83   Ht 5\' 5"  (1.651 m)   Wt 213 lb (96.6 kg)   SpO2 97%   BMI 35.45 kg/m   Opioid Risk Score:   Fall Risk Score:  `1  Depression screen [facility] 2/9     09/21/2023    9:34 AM 08/10/2023   11:24 AM 06/20/2023    8:28 AM 10/16/2022    9:49 AM 06/01/2022    9:20 AM 04/12/2022    8:45 AM 05/26/2021   10:33 AM  Depression screen PHQ 2/9  Decreased Interest 0 0 0 0 0 0 0  Down, Depressed, Hopeless  0 0 0 0 0 0 0  PHQ - 2 Score 0 0 0 0 0 0 0  Altered sleeping  3 0      Tired, decreased energy  0 0      Change in appetite  2 0      Feeling bad or failure about yourself   0 0      Trouble concentrating  0 0      Moving slowly or fidgety/restless  0 0      Suicidal thoughts   0      PHQ-9 Score  5 0      Difficult doing work/chores   Not difficult at all          Review of Systems  Musculoskeletal:  Positive for gait problem.       Bilateral leg and knee pain  Neurological:  Positive for weakness and numbness.  All other systems reviewed and are negative.      Objective:   Physical Exam   Physical Exam   Gen: no distress, normal appearing HEENT: oral mucosa pink and moist, NCAT Chest: normal effort, normal rate of breathing Abd: soft, non-distended Ext: no edema Psych: pleasant, normal affect Skin: intact Neuro:  Alert and awake, follows commands, cranial nerves II through XII grossly intact, normal speech and language RUE: 5/5 Deltoid, 5/5 Biceps, 5/5 Triceps, 5/5 Wrist Ext, 5/5 Grip LUE: 5/5 Deltoid, 5/5 Biceps, 5/5 Triceps, 5/5 Wrist Ext, 5/5 Grip RLE: HF 4/5, KE 4/5, ADF 4/5, APF 4/5 LLE: HF 4-/5, KE 4-/5, ADF 4/5, APF 4/5 Sensory exam normal for light touch and pain in all 4 limbs.  No abnormal tone noted Musculoskeletal:   B/L knee joint tenderness left greater than right- unchanged 1/17 Mild TTP lumbar spine B/l TKA incision scars- healed, mild swelling around her knees b/l Mild diffuse tenderness throughout b/l UE , hips, ankles Mild TTP C spine        Assessment & Plan:   Assessment 1) B/L knee pain s/p b/l Knee TKA 2) Chronic lower back pain  3) Polyarthralgia    Plan --Continue UDS and pill counts.  Continue PDMP monitoring.  Pain contract completed prior visit. -Discussed bringing pill bottle with any medications even if empty to all appointments -Order tylenol #3 BID PRN -Exercises for b/l Knees provided-advised to complete three times a week - She reports she did not have improvement after trying this  -Consider Genicular nerve blocks - discussed with pt, she will consider

## 2023-09-28 ENCOUNTER — Other Ambulatory Visit: Payer: Self-pay | Admitting: General Practice

## 2023-09-28 ENCOUNTER — Other Ambulatory Visit: Payer: Self-pay | Admitting: Cardiology

## 2023-09-28 ENCOUNTER — Other Ambulatory Visit: Payer: Self-pay | Admitting: Nurse Practitioner

## 2023-10-13 ENCOUNTER — Other Ambulatory Visit: Payer: Self-pay | Admitting: Nurse Practitioner

## 2023-10-16 ENCOUNTER — Other Ambulatory Visit: Payer: Self-pay | Admitting: Nurse Practitioner

## 2023-10-16 DIAGNOSIS — I10 Essential (primary) hypertension: Secondary | ICD-10-CM

## 2023-10-22 ENCOUNTER — Encounter: Payer: Self-pay | Admitting: Physical Medicine & Rehabilitation

## 2023-10-22 ENCOUNTER — Encounter: Payer: 59 | Attending: Physical Medicine & Rehabilitation | Admitting: Physical Medicine & Rehabilitation

## 2023-10-22 VITALS — BP 135/77 | HR 83 | Ht 65.0 in | Wt 214.0 lb

## 2023-10-22 DIAGNOSIS — G8929 Other chronic pain: Secondary | ICD-10-CM

## 2023-10-22 DIAGNOSIS — M25561 Pain in right knee: Secondary | ICD-10-CM

## 2023-10-22 DIAGNOSIS — M25562 Pain in left knee: Secondary | ICD-10-CM | POA: Diagnosis not present

## 2023-10-22 DIAGNOSIS — M545 Low back pain, unspecified: Secondary | ICD-10-CM

## 2023-10-22 DIAGNOSIS — Z5181 Encounter for therapeutic drug level monitoring: Secondary | ICD-10-CM | POA: Diagnosis not present

## 2023-10-22 DIAGNOSIS — G894 Chronic pain syndrome: Secondary | ICD-10-CM

## 2023-10-22 DIAGNOSIS — M2550 Pain in unspecified joint: Secondary | ICD-10-CM | POA: Diagnosis not present

## 2023-10-22 MED ORDER — BUPRENORPHINE 5 MCG/HR TD PTWK: 1.0000 | MEDICATED_PATCH | TRANSDERMAL | 0 refills | Status: DC

## 2023-10-22 NOTE — Progress Notes (Signed)
Subjective:    Patient ID: Peggy [name], female    DOB: [birth year], 78 y.o.   MRN: [id number]  HPI  HPI   Peggy [name] is a 78 y.o. year old female  who  has a past medical history of Anxiety, Arthritis, Asthma, Bladder cancer (HCC), Chest pain at rest (02/18/2015), Chronic bronchitis (HCC), Chronic lower back pain, Coronary artery disease, non-occlusive (2005), Daily headache, Deafness in left ear, Depression, DVT (deep venous thrombosis) (HCC) (2009/2010), GERD (gastroesophageal reflux disease), History of kidney stones, Hyperlipidemia with target LDL less than 100, Hypertension, Leg swelling (12/12/2017), Nummular eczema, Sciatica (12/10/2012), and Skin cancer.   They are presenting to PM&R clinic as a new patient for pain management evaluation. They were referred by Arnette Felts for treatment of knee pain.  Patient reports she had had chronic knee pain for many years.  She reports having a history of 3 knee replacements on the left knee and " 2-1/2" knee replacements on her left knee, 1 partial knee and 2 full knee replacements.  She continues to have severe pain in her knees particularly on the left side.  Pain is present all the time but worsened with ambulation.     She also reports arthritis in multiple other areas of her body including her lower back but pain in other locations is overall tolerable.   She will alternate between using a cane, walker, and wheelchair for mobility.   Exacerbating factors:  Red flag symptoms: No red flags for back pain endorsed in Hx or ROS   Medications tried: Topical medications - Voltaren, Bengay-minimal benefit Nsaids Ibuprofen- doesn't help Tylenol minimal benefit Opiates Tramadol- made her stomach upset  Hydrocodone/Oxycodone-resulted in GI upset Tylenol with codeine-she says she uses years ago, does not recall any side effects but has poor recollection of this medication Gabapentin / Lyrica  - Denies  TCAs  - Denies  SNRIs - Denies    Other  treatments: PT- Last tried many years ago  TENs unit - Denies benefit  Injections - No recent injections  Surgery b/l TKA  Prior back surgery years ago   Interval History 09/20/22 Patient is here for follow-up regarding her chronic knee and lower back pain.  Pain overall unchanged from prior visit.  Pain continues make ambulation and activities more difficult.  He has recently been worsened by cold weather.  Interval History 10/22/23 Patient is here for follow-up regarding her chronic knee and low back pain.  She reports her knee pain continues to be very severe low back pain is under control.  Pain continues to be worse with activities and walking/standing.  She tried Tylenol 3 but this does make her fatigued.   Pain Inventory Average Pain 10 Pain Right Now 10 My pain is constant, sharp, burning, and aching  In the last 24 hours, has pain interfered with the following? General activity 9 Relation with others 0 Enjoyment of life 0 What TIME of day is your pain at its worst? morning , daytime, evening, and night Sleep (in general) Fair  Pain is worse with: walking, sitting, standing, and some activites Pain improves with:  nothing Relief from Meds:  na  Family History  Problem Relation Age of Onset   Heart failure Mother    Cancer Mother    Hypertension Mother    Hypertension Father    Heart attack Father        Noted on cardiology intake form as "massive heart attack" at age 59  Heart failure Sister    Heart attack Sister        Not listed on her cardiology intake form   Breast cancer Sister    Heart failure Sister    Heart attack Sister        Not listed on her cardiology intake   Cancer Sister    Breast cancer Sister    Social History   Socioeconomic History   Marital status: Single    Spouse name: Not on file   Number of children: 1   Years of education: Not on file   Highest education level: Not on file  Occupational History   Occupation: Retired     Associate Professor: RETIRED    Comment: Psychiatric nurse   Tobacco Use   Smoking status: Never   Smokeless tobacco: Never  Vaping Use   Vaping status: Never Used  Substance and Sexual Activity   Alcohol use: No   Drug use: No   Sexual activity: Not Currently  Other Topics Concern   Not on file  Social History Narrative   Relocated from [place]   She is currently "'.  She lives with her son (who is autistic).  She herself is disabled, and not working.   She never smoked and never drank alcohol.   She enjoys walking 7 days a week.   Social Drivers of Corporate investment banker Strain: Low Risk  (06/20/2023)   Overall Financial Resource Strain (CARDIA)    Difficulty of Paying Living Expenses: Not hard at all  Food Insecurity: No Food Insecurity (06/20/2023)   Hunger Vital Sign    Worried About Running Out of Food in the Last Year: Never true    Ran Out of Food in the Last Year: Never true  Transportation Needs: No Transportation Needs (06/20/2023)   PRAPARE - Administrator, Civil Service (Medical): No    Lack of Transportation (Non-Medical): No  Physical Activity: Inactive (06/20/2023)   Exercise Vital Sign    Days of Exercise per Week: 0 days    Minutes of Exercise per Session: 0 min  Stress: No Stress Concern Present (06/20/2023)   [employer] of Occupational Health - Occupational Stress Questionnaire    Feeling of Stress : Not at all  Social Connections: Socially Isolated (06/20/2023)   Social Connection and Isolation Panel [NHANES]    Frequency of Communication with Friends and Family: More than three times a week    Frequency of Social Gatherings with Friends and Family: Twice a week    Attends Religious Services: Never    Database administrator or Organizations: No    Attends Engineer, structural: Never    Marital Status: Never married   Past Surgical History:  Procedure Laterality Date   ABDOMINAL HYSTERECTOMY  1980's   APPENDECTOMY Right     BACK SURGERY     x 2, lower   BLADDER TUMOR EXCISION     BREAST BIOPSY Right    BREAST EXCISIONAL BIOPSY Right    CAROTID ARTERY DOPPLERS   10/2017   Carotid Dopplers: 40-59% stenosis on the Right Internal Carotid, < 40% stenosis in the Left.  Both vertebral arteries show normal flow.  Left subclavian artery is normal, but there is some flow disturbance in the right subclavian artery.   CATARACT EXTRACTION Left    2024   COLONOSCOPY WITH PROPOFOL N/A 02/26/2015   Procedure: COLONOSCOPY WITH PROPOFOL;  Surgeon: Jeani Hawking, MD;  Location: [facility];  Service: Endoscopy;  Laterality: N/A;   COLONOSCOPY WITH PROPOFOL N/A 12/06/2017   Procedure: COLONOSCOPY WITH PROPOFOL;  Surgeon: Charna Elizabeth, MD;  Location: [facility];  Service: Endoscopy;  Laterality: N/A;   CORONARY CALCIUM SCORE & CT ANGIOGRAM  11/2017   Coronary calcium score 131 (moderate risk).  Moderate proximal LAD & LCx stenosis --> LOW RISK STUDY.  No evidence to suspect cardiac related chest pain --> correlates with cardiac catheterizations   ENTEROSCOPY N/A 12/14/2017   Procedure: ENTEROSCOPY;  Surgeon: Jeani Hawking, MD;  Location: [facility];  Service: Endoscopy;  Laterality: N/A;   ESOPHAGOGASTRODUODENOSCOPY Left 02/20/2015   Procedure: ESOPHAGOGASTRODUODENOSCOPY (EGD);  Surgeon: Rachael Fee, MD;  Location: [facility];  Service: Endoscopy;  Laterality: Left;   ESOPHAGOGASTRODUODENOSCOPY (EGD) WITH PROPOFOL N/A 12/06/2017   Procedure: ESOPHAGOGASTRODUODENOSCOPY (EGD) WITH PROPOFOL;  Surgeon: Charna Elizabeth, MD;  Location: [facility];  Service: Endoscopy;  Laterality: N/A;   HOT HEMOSTASIS N/A 12/14/2017   Procedure: HOT HEMOSTASIS (ARGON PLASMA COAGULATION/BICAP);  Surgeon: Jeani Hawking, MD;  Location: [facility];  Service: Endoscopy;  Laterality: N/A;   JOINT REPLACEMENT     KNEE ARTHROSCOPY Right ~ 2000   LEFT HEART CATH AND CORONARY ANGIOGRAPHY  11/'05; 2/'12   Angiographically normal coronary arteries    LUMBAR DISC SURGERY  1980's X 1; ~ 2002   MEDIAL PARTIAL KNEE REPLACEMENT Right 2001   NM MYOVIEW LTD  02/2015   EF 65%.  No reversible ischemia.  Possible scar along the cardiac apex and anteroseptal wall.  (Mild severity, medium-sized defect in the apex anteroseptal wall noted on both resting and stress images).  No inducible ischemia.  Read as LOW RISK   NM MYOVIEW LTD  12/09/2020   LOW RISK.  EF 55 to 65%.  No ischemia or infarct.   PARTIAL KNEE ARTHROPLASTY Left 2009   REPLACEMENT TOTAL KNEE BILATERAL Bilateral    REVISION TOTAL KNEE ARTHROPLASTY Left    SKIN CANCER EXCISION Right    "abdomen"   TRANSTHORACIC ECHOCARDIOGRAM  06/'16; 2/'19; 1/'21    A) mod Conc LVH. EF 60-65%.  No RWMA. GR 1 D. Mild Ao Stenosis. Mild LA dilation.;  B) EF 60-65%.  Mild LVH.  No or W MA.  No significant valve disease.;l 01/2020: Normal LV systolic function.  Mild LVH.  GR 1 DD.  EF 60 to 65%.  No R WMA.  Trace AI.  No AS   Past Surgical History:  Procedure Laterality Date   ABDOMINAL HYSTERECTOMY  1980's   APPENDECTOMY Right    BACK SURGERY     x 2, lower   BLADDER TUMOR EXCISION     BREAST BIOPSY Right    BREAST EXCISIONAL BIOPSY Right    CAROTID ARTERY DOPPLERS   10/2017   Carotid Dopplers: 40-59% stenosis on the Right Internal Carotid, < 40% stenosis in the Left.  Both vertebral arteries show normal flow.  Left subclavian artery is normal, but there is some flow disturbance in the right subclavian artery.   CATARACT EXTRACTION Left    2024   COLONOSCOPY WITH PROPOFOL N/A 02/26/2015   Procedure: COLONOSCOPY WITH PROPOFOL;  Surgeon: Jeani Hawking, MD;  Location: [facility];  Service: Endoscopy;  Laterality: N/A;   COLONOSCOPY WITH PROPOFOL N/A 12/06/2017   Procedure: COLONOSCOPY WITH PROPOFOL;  Surgeon: Charna Elizabeth, MD;  Location: [facility];  Service: Endoscopy;  Laterality: N/A;   CORONARY CALCIUM SCORE & CT ANGIOGRAM  11/2017   Coronary calcium score 131 (moderate risk).  Moderate proximal  LAD & LCx stenosis --> LOW RISK STUDY.  No evidence to suspect cardiac related chest pain --> correlates with cardiac catheterizations   ENTEROSCOPY N/A 12/14/2017   Procedure: ENTEROSCOPY;  Surgeon: Jeani Hawking, MD;  Location: [facility];  Service: Endoscopy;  Laterality: N/A;   ESOPHAGOGASTRODUODENOSCOPY Left 02/20/2015   Procedure: ESOPHAGOGASTRODUODENOSCOPY (EGD);  Surgeon: Rachael Fee, MD;  Location: [facility] ENDOSCOPY;  Service: Endoscopy;  Laterality: Left;   ESOPHAGOGASTRODUODENOSCOPY (EGD) WITH PROPOFOL N/A 12/06/2017   Procedure: ESOPHAGOGASTRODUODENOSCOPY (EGD) WITH PROPOFOL;  Surgeon: Charna Elizabeth, MD;  Location: [facility];  Service: Endoscopy;  Laterality: N/A;   HOT HEMOSTASIS N/A 12/14/2017   Procedure: HOT HEMOSTASIS (ARGON PLASMA COAGULATION/BICAP);  Surgeon: Jeani Hawking, MD;  Location: [facility];  Service: Endoscopy;  Laterality: N/A;   JOINT REPLACEMENT     KNEE ARTHROSCOPY Right ~ 2000   LEFT HEART CATH AND CORONARY ANGIOGRAPHY  11/'05; 2/'12   Angiographically normal coronary arteries   LUMBAR DISC SURGERY  1980's X 1; ~ 2002   MEDIAL PARTIAL KNEE REPLACEMENT Right 2001   NM MYOVIEW LTD  02/2015   EF 65%.  No reversible ischemia.  Possible scar along the cardiac apex and anteroseptal wall.  (Mild severity, medium-sized defect in the apex anteroseptal wall noted on both resting and stress images).  No inducible ischemia.  Read as LOW RISK   NM MYOVIEW LTD  12/09/2020   LOW RISK.  EF 55 to 65%.  No ischemia or infarct.   PARTIAL KNEE ARTHROPLASTY Left 2009   REPLACEMENT TOTAL KNEE BILATERAL Bilateral    REVISION TOTAL KNEE ARTHROPLASTY Left    SKIN CANCER EXCISION Right    "abdomen"   TRANSTHORACIC ECHOCARDIOGRAM  06/'16; 2/'19; 1/'21    A) mod Conc LVH. EF 60-65%.  No RWMA. GR 1 D. Mild Ao Stenosis. Mild LA dilation.;  B) EF 60-65%.  Mild LVH.  No or W MA.  No significant valve disease.;l 01/2020: Normal LV systolic function.  Mild LVH.  GR 1 DD.  EF 60 to 65%.   No R WMA.  Trace AI.  No AS   Past Medical History:  Diagnosis Date   Anxiety    Arthritis    "all over"   Asthma    Bladder cancer (HCC)    Chest pain at rest 02/18/2015   Chronic bronchitis [facility])    'get it q yr"   Chronic lower back pain    Coronary artery disease, non-occlusive 2005   Cardiac catheter 2005 and 2012 for "anginal symptoms "showed angiographically normal coronary arteries. -->  Negative Myoview June 2016.  Coronary calcification noted on chest CT -> calcium score 131, moderate proximal LAD and LCx disease on coronary CTA - (2019)   Daily headache    Deafness in left ear    Depression    DVT (deep venous thrombosis) (HCC) 2009/2010   "probably left leg"    GERD (gastroesophageal reflux disease)    History of kidney stones    Hyperlipidemia with target LDL less than 100    Hypertension    Leg swelling 12/12/2017   Nummular eczema    Sciatica 12/10/2012   Skin cancer    "right abdomen"   Ht 5\' 5"  (1.651 m)   Wt 214 lb (97.1 kg)   BMI 35.61 kg/m   Opioid Risk Score:   Fall Risk Score:  `1  Depression screen [facility] 2/9     09/21/2023    9:34 AM 08/10/2023   11:24 AM 06/20/2023  8:28 AM 10/16/2022    9:49 AM 06/01/2022    9:20 AM 04/12/2022    8:45 AM 05/26/2021   10:33 AM  Depression screen PHQ 2/9  Decreased Interest 0 0 0 0 0 0 0  Down, Depressed, Hopeless 0 0 0 0 0 0 0  PHQ - 2 Score 0 0 0 0 0 0 0  Altered sleeping  3 0      Tired, decreased energy  0 0      Change in appetite  2 0      Feeling bad or failure about yourself   0 0      Trouble concentrating  0 0      Moving slowly or fidgety/restless  0 0      Suicidal thoughts   0      PHQ-9 Score  5 0      Difficult doing work/chores   Not difficult at all          Review of Systems  Musculoskeletal:  Positive for gait problem.       Bilateral leg and knee pain  Neurological:  Positive for weakness and numbness.  All other systems reviewed and are negative.      Objective:   Physical  Exam   Physical Exam   Gen: no distress, normal appearing HEENT: oral mucosa pink and moist, NCAT Chest: normal effort, normal rate of breathing Abd: soft, non-distended Ext: no edema Psych: pleasant, normal affect Skin: intact Neuro: Alert and awake, follows commands, cranial nerves II through XII grossly intact, normal speech and language RUE: 5/5 Deltoid, 5/5 Biceps, 5/5 Triceps, 5/5 Wrist Ext, 5/5 Grip LUE: 5/5 Deltoid, 5/5 Biceps, 5/5 Triceps, 5/5 Wrist Ext, 5/5 Grip RLE: HF 4/5, KE 4/5, ADF 4/5, APF 4/5 LLE: HF 4-/5, KE 4-/5, ADF 4/5, APF 4/5 Sensory exam normal for light touch and pain in all 4 limbs.  No abnormal tone noted Musculoskeletal:   B/L knee TTP left greater than right No significant TTP L-spine or C-spine today B/l TKA incision scars- healed, mild swelling around her knees b/l Mild diffuse tenderness throughout b/l UE , hips, ankles        Assessment & Plan:   Assessment 1) B/L knee pain s/p b/l Knee TKA 2) Chronic lower back pain  3) Polyarthralgia    Plan --Continue UDS and pill counts.  Continue PDMP monitoring.  Pain contract completed prior visit. -Discussed bringing pill bottle with any medications even if empty to all appointments -DC tylenol #3 BID PRN-sedation related -Will start Butrans 5 mcg/h patch -Exercises for b/l Knees provided-advised to complete three times a week - She reports she did not have improvement after trying this  -Consider Genicular nerve blocks -she would like to speak with Dr. Wynn Banker before considering, will place consult -Verbal warning today for forgetting to bring pills in

## 2023-11-09 ENCOUNTER — Other Ambulatory Visit: Payer: Self-pay | Admitting: Cardiology

## 2023-11-20 ENCOUNTER — Telehealth: Payer: Self-pay | Admitting: Psychiatry

## 2023-11-20 NOTE — Telephone Encounter (Signed)
 Pt unable to come to appointment due to have the flu. Will call back toi reschedule

## 2023-11-27 ENCOUNTER — Encounter: Payer: Self-pay | Admitting: Physical Medicine & Rehabilitation

## 2023-11-27 ENCOUNTER — Encounter: Payer: 59 | Attending: Physical Medicine & Rehabilitation | Admitting: Physical Medicine & Rehabilitation

## 2023-11-27 ENCOUNTER — Ambulatory Visit: Payer: 59 | Admitting: Neurology

## 2023-11-27 VITALS — BP 153/73 | HR 73 | Ht 65.0 in | Wt 212.8 lb

## 2023-11-27 DIAGNOSIS — M25561 Pain in right knee: Secondary | ICD-10-CM

## 2023-11-27 DIAGNOSIS — G8929 Other chronic pain: Secondary | ICD-10-CM | POA: Diagnosis not present

## 2023-11-27 DIAGNOSIS — G894 Chronic pain syndrome: Secondary | ICD-10-CM | POA: Diagnosis not present

## 2023-11-27 DIAGNOSIS — M25562 Pain in left knee: Secondary | ICD-10-CM | POA: Diagnosis not present

## 2023-11-27 DIAGNOSIS — Z5181 Encounter for therapeutic drug level monitoring: Secondary | ICD-10-CM | POA: Diagnosis not present

## 2023-11-27 DIAGNOSIS — M545 Low back pain, unspecified: Secondary | ICD-10-CM | POA: Diagnosis not present

## 2023-11-27 NOTE — Progress Notes (Signed)
 Subjective:    Patient ID: Peggy [name], female    DOB: 1946-07-11, 78 y.o.   MRN: [id number]  HPI  HPI   Peggy [name] is a 78 y.o. year old female  who  has a past medical history of Anxiety, Arthritis, Asthma, Bladder cancer (HCC), Chest pain at rest (02/18/2015), Chronic bronchitis (HCC), Chronic lower back pain, Coronary artery disease, non-occlusive (2005), Daily headache, Deafness in left ear, Depression, DVT (deep venous thrombosis) (HCC) (2009/2010), GERD (gastroesophageal reflux disease), History of kidney stones, Hyperlipidemia with target LDL less than 100, Hypertension, Leg swelling (12/12/2017), Nummular eczema, Sciatica (12/10/2012), and Skin cancer.   They are presenting to PM&R clinic as a new patient for pain management evaluation. They were referred by Arnette Felts for treatment of knee pain.  Patient reports she had had chronic knee pain for many years.  She reports having a history of 3 knee replacements on the left knee and " 2-1/2" knee replacements on her left knee, 1 partial knee and 2 full knee replacements.  She continues to have severe pain in her knees particularly on the left side.  Pain is present all the time but worsened with ambulation.     She also reports arthritis in multiple other areas of her body including her lower back but pain in other locations is overall tolerable.   She will alternate between using a cane, walker, and wheelchair for mobility.   Exacerbating factors:  Red flag symptoms: No red flags for back pain endorsed in Hx or ROS   Medications tried: Topical medications - Voltaren, Bengay-minimal benefit Nsaids Ibuprofen- doesn't help Tylenol minimal benefit Opiates Tramadol- made her stomach upset  Hydrocodone/Oxycodone-resulted in GI upset Tylenol with codeine-she says she uses years ago, does not recall any side effects but has poor recollection of this medication Gabapentin / Lyrica  - Denies  TCAs  - Denies  SNRIs - Denies    Other  treatments: PT- Last tried many years ago  TENs unit - Denies benefit  Injections - No recent injections  Surgery b/l TKA  Prior back surgery years ago   Interval History 09/20/22 Patient is here for follow-up regarding her chronic knee and lower back pain.  Pain overall unchanged from prior visit.  Pain continues make ambulation and activities more difficult.  He has recently been worsened by cold weather.  Interval History 10/22/23 Patient is here for follow-up regarding her chronic knee and low back pain.  She reports her knee pain continues to be very severe low back pain is under control.  Pain continues to be worse with activities and walking/standing.  She tried Tylenol 3 but this does make her fatigued.  Interval History 11/27/2023 Patient is here for follow-up regarding her chronic knee pain.  She also has pain in her lower back but this has been less severe than her knees.  She did not try Butrans patch, read the warning on the medication and felt like there were too many side effects.  She did not like that it said she should not take long hot showers.  She would still like to speak with Dr. Wynn Banker about possible genicular nerve blocks, appears this was not scheduled after last visit.   Pain Inventory Average Pain 10 Pain Right Now 10 My pain is constant, sharp, burning, and aching  In the last 24 hours, has pain interfered with the following? General activity 9 Relation with others 0 Enjoyment of life 0 What TIME of day is  your pain at its worst? morning , daytime, evening, and night Sleep (in general) Poor  Pain is worse with: walking, sitting, standing, and some activites Pain improves with:  nothing Relief from Meds:  na  Family History  Problem Relation Age of Onset   Heart failure Mother    Cancer Mother    Hypertension Mother    Hypertension Father    Heart attack Father        Noted on cardiology intake form as "massive heart attack" at age 76   Heart  failure Sister    Heart attack Sister        Not listed on her cardiology intake form   Breast cancer Sister    Heart failure Sister    Heart attack Sister        Not listed on her cardiology intake   Cancer Sister    Breast cancer Sister    Social History   Socioeconomic History   Marital status: Single    Spouse name: Not on file   Number of children: 1   Years of education: Not on file   Highest education level: Not on file  Occupational History   Occupation: Retired    Associate Professor: RETIRED    Comment: Psychiatric nurse   Tobacco Use   Smoking status: Never   Smokeless tobacco: Never  Vaping Use   Vaping status: Never Used  Substance and Sexual Activity   Alcohol use: No   Drug use: No   Sexual activity: Not Currently  Other Topics Concern   Not on file  Social History Narrative   Relocated from [place]   She is currently "'.  She lives with her son (who is autistic).  She herself is disabled, and not working.   She never smoked and never drank alcohol.   She enjoys walking 7 days a week.   Social Drivers of Corporate investment banker Strain: Low Risk  (06/20/2023)   Overall Financial Resource Strain (CARDIA)    Difficulty of Paying Living Expenses: Not hard at all  Food Insecurity: No Food Insecurity (06/20/2023)   Hunger Vital Sign    Worried About Running Out of Food in the Last Year: Never true    Ran Out of Food in the Last Year: Never true  Transportation Needs: No Transportation Needs (06/20/2023)   PRAPARE - Administrator, Civil Service (Medical): No    Lack of Transportation (Non-Medical): No  Physical Activity: Inactive (06/20/2023)   Exercise Vital Sign    Days of Exercise per Week: 0 days    Minutes of Exercise per Session: 0 min  Stress: No Stress Concern Present (06/20/2023)   [employer] of Occupational Health - Occupational Stress Questionnaire    Feeling of Stress : Not at all  Social Connections: Socially Isolated  (06/20/2023)   Social Connection and Isolation Panel [NHANES]    Frequency of Communication with Friends and Family: More than three times a week    Frequency of Social Gatherings with Friends and Family: Twice a week    Attends Religious Services: Never    Database administrator or Organizations: No    Attends Engineer, structural: Never    Marital Status: Never married   Past Surgical History:  Procedure Laterality Date   ABDOMINAL HYSTERECTOMY  1980's   APPENDECTOMY Right    BACK SURGERY     x 2, lower   BLADDER TUMOR EXCISION  BREAST BIOPSY Right    BREAST EXCISIONAL BIOPSY Right    CAROTID ARTERY DOPPLERS   10/2017   Carotid Dopplers: 40-59% stenosis on the Right Internal Carotid, < 40% stenosis in the Left.  Both vertebral arteries show normal flow.  Left subclavian artery is normal, but there is some flow disturbance in the right subclavian artery.   CATARACT EXTRACTION Left    2024   COLONOSCOPY WITH PROPOFOL N/A 02/26/2015   Procedure: COLONOSCOPY WITH PROPOFOL;  Surgeon: Jeani Hawking, MD;  Location: [facility];  Service: Endoscopy;  Laterality: N/A;   COLONOSCOPY WITH PROPOFOL N/A 12/06/2017   Procedure: COLONOSCOPY WITH PROPOFOL;  Surgeon: Charna Elizabeth, MD;  Location: [facility];  Service: Endoscopy;  Laterality: N/A;   CORONARY CALCIUM SCORE & CT ANGIOGRAM  11/2017   Coronary calcium score 131 (moderate risk).  Moderate proximal LAD & LCx stenosis --> LOW RISK STUDY.  No evidence to suspect cardiac related chest pain --> correlates with cardiac catheterizations   ENTEROSCOPY N/A 12/14/2017   Procedure: ENTEROSCOPY;  Surgeon: Jeani Hawking, MD;  Location: [facility];  Service: Endoscopy;  Laterality: N/A;   ESOPHAGOGASTRODUODENOSCOPY Left 02/20/2015   Procedure: ESOPHAGOGASTRODUODENOSCOPY (EGD);  Surgeon: Rachael Fee, MD;  Location: [facility] ENDOSCOPY;  Service: Endoscopy;  Laterality: Left;   ESOPHAGOGASTRODUODENOSCOPY (EGD) WITH PROPOFOL N/A 12/06/2017    Procedure: ESOPHAGOGASTRODUODENOSCOPY (EGD) WITH PROPOFOL;  Surgeon: Charna Elizabeth, MD;  Location: [facility];  Service: Endoscopy;  Laterality: N/A;   HOT HEMOSTASIS N/A 12/14/2017   Procedure: HOT HEMOSTASIS (ARGON PLASMA COAGULATION/BICAP);  Surgeon: Jeani Hawking, MD;  Location: [facility];  Service: Endoscopy;  Laterality: N/A;   JOINT REPLACEMENT     KNEE ARTHROSCOPY Right ~ 2000   LEFT HEART CATH AND CORONARY ANGIOGRAPHY  11/'05; 2/'12   Angiographically normal coronary arteries   LUMBAR DISC SURGERY  1980's X 1; ~ 2002   MEDIAL PARTIAL KNEE REPLACEMENT Right 2001   NM MYOVIEW LTD  02/2015   EF 65%.  No reversible ischemia.  Possible scar along the cardiac apex and anteroseptal wall.  (Mild severity, medium-sized defect in the apex anteroseptal wall noted on both resting and stress images).  No inducible ischemia.  Read as LOW RISK   NM MYOVIEW LTD  12/09/2020   LOW RISK.  EF 55 to 65%.  No ischemia or infarct.   PARTIAL KNEE ARTHROPLASTY Left 2009   REPLACEMENT TOTAL KNEE BILATERAL Bilateral    REVISION TOTAL KNEE ARTHROPLASTY Left    SKIN CANCER EXCISION Right    "abdomen"   TRANSTHORACIC ECHOCARDIOGRAM  06/'16; 2/'19; 1/'21    A) mod Conc LVH. EF 60-65%.  No RWMA. GR 1 D. Mild Ao Stenosis. Mild LA dilation.;  B) EF 60-65%.  Mild LVH.  No or W MA.  No significant valve disease.;l 01/2020: Normal LV systolic function.  Mild LVH.  GR 1 DD.  EF 60 to 65%.  No R WMA.  Trace AI.  No AS   Past Surgical History:  Procedure Laterality Date   ABDOMINAL HYSTERECTOMY  1980's   APPENDECTOMY Right    BACK SURGERY     x 2, lower   BLADDER TUMOR EXCISION     BREAST BIOPSY Right    BREAST EXCISIONAL BIOPSY Right    CAROTID ARTERY DOPPLERS   10/2017   Carotid Dopplers: 40-59% stenosis on the Right Internal Carotid, < 40% stenosis in the Left.  Both vertebral arteries show normal flow.  Left subclavian artery is normal, but there is some flow disturbance  in the right subclavian artery.    CATARACT EXTRACTION Left    2024   COLONOSCOPY WITH PROPOFOL N/A 02/26/2015   Procedure: COLONOSCOPY WITH PROPOFOL;  Surgeon: Jeani Hawking, MD;  Location: [facility];  Service: Endoscopy;  Laterality: N/A;   COLONOSCOPY WITH PROPOFOL N/A 12/06/2017   Procedure: COLONOSCOPY WITH PROPOFOL;  Surgeon: Charna Elizabeth, MD;  Location: [facility];  Service: Endoscopy;  Laterality: N/A;   CORONARY CALCIUM SCORE & CT ANGIOGRAM  11/2017   Coronary calcium score 131 (moderate risk).  Moderate proximal LAD & LCx stenosis --> LOW RISK STUDY.  No evidence to suspect cardiac related chest pain --> correlates with cardiac catheterizations   ENTEROSCOPY N/A 12/14/2017   Procedure: ENTEROSCOPY;  Surgeon: Jeani Hawking, MD;  Location: [facility];  Service: Endoscopy;  Laterality: N/A;   ESOPHAGOGASTRODUODENOSCOPY Left 02/20/2015   Procedure: ESOPHAGOGASTRODUODENOSCOPY (EGD);  Surgeon: Rachael Fee, MD;  Location: [facility] ENDOSCOPY;  Service: Endoscopy;  Laterality: Left;   ESOPHAGOGASTRODUODENOSCOPY (EGD) WITH PROPOFOL N/A 12/06/2017   Procedure: ESOPHAGOGASTRODUODENOSCOPY (EGD) WITH PROPOFOL;  Surgeon: Charna Elizabeth, MD;  Location: [facility];  Service: Endoscopy;  Laterality: N/A;   HOT HEMOSTASIS N/A 12/14/2017   Procedure: HOT HEMOSTASIS (ARGON PLASMA COAGULATION/BICAP);  Surgeon: Jeani Hawking, MD;  Location: [facility];  Service: Endoscopy;  Laterality: N/A;   JOINT REPLACEMENT     KNEE ARTHROSCOPY Right ~ 2000   LEFT HEART CATH AND CORONARY ANGIOGRAPHY  11/'05; 2/'12   Angiographically normal coronary arteries   LUMBAR DISC SURGERY  1980's X 1; ~ 2002   MEDIAL PARTIAL KNEE REPLACEMENT Right 2001   NM MYOVIEW LTD  02/2015   EF 65%.  No reversible ischemia.  Possible scar along the cardiac apex and anteroseptal wall.  (Mild severity, medium-sized defect in the apex anteroseptal wall noted on both resting and stress images).  No inducible ischemia.  Read as LOW RISK   NM MYOVIEW LTD  12/09/2020   LOW  RISK.  EF 55 to 65%.  No ischemia or infarct.   PARTIAL KNEE ARTHROPLASTY Left 2009   REPLACEMENT TOTAL KNEE BILATERAL Bilateral    REVISION TOTAL KNEE ARTHROPLASTY Left    SKIN CANCER EXCISION Right    "abdomen"   TRANSTHORACIC ECHOCARDIOGRAM  06/'16; 2/'19; 1/'21    A) mod Conc LVH. EF 60-65%.  No RWMA. GR 1 D. Mild Ao Stenosis. Mild LA dilation.;  B) EF 60-65%.  Mild LVH.  No or W MA.  No significant valve disease.;l 01/2020: Normal LV systolic function.  Mild LVH.  GR 1 DD.  EF 60 to 65%.  No R WMA.  Trace AI.  No AS   Past Medical History:  Diagnosis Date   Anxiety    Arthritis    "all over"   Asthma    Bladder cancer (HCC)    Chest pain at rest 02/18/2015   Chronic bronchitis [facility])    'get it q yr"   Chronic lower back pain    Coronary artery disease, non-occlusive 2005   Cardiac catheter 2005 and 2012 for "anginal symptoms "showed angiographically normal coronary arteries. -->  Negative Myoview June 2016.  Coronary calcification noted on chest CT -> calcium score 131, moderate proximal LAD and LCx disease on coronary CTA - (2019)   Daily headache    Deafness in left ear    Depression    DVT (deep venous thrombosis) (HCC) 2009/2010   "probably left leg"    GERD (gastroesophageal reflux disease)    History of kidney stones  Hyperlipidemia with target LDL less than 100    Hypertension    Leg swelling 12/12/2017   Nummular eczema    Sciatica 12/10/2012   Skin cancer    "right abdomen"   BP (!) 153/73   Pulse 73   Ht 5\' 5"  (1.651 m)   Wt 212 lb 12.8 oz (96.5 kg)   SpO2 97%   BMI 35.41 kg/m   Opioid Risk Score:   Fall Risk Score:  `1  Depression screen [facility] 2/9     09/21/2023    9:34 AM 08/10/2023   11:24 AM 06/20/2023    8:28 AM 10/16/2022    9:49 AM 06/01/2022    9:20 AM 04/12/2022    8:45 AM 05/26/2021   10:33 AM  Depression screen PHQ 2/9  Decreased Interest 0 0 0 0 0 0 0  Down, Depressed, Hopeless 0 0 0 0 0 0 0  PHQ - 2 Score 0 0 0 0 0 0 0  Altered sleeping   3 0      Tired, decreased energy  0 0      Change in appetite  2 0      Feeling bad or failure about yourself   0 0      Trouble concentrating  0 0      Moving slowly or fidgety/restless  0 0      Suicidal thoughts   0      PHQ-9 Score  5 0      Difficult doing work/chores   Not difficult at all          Review of Systems  Musculoskeletal:  Positive for gait problem.       Bilateral leg and knee pain  Neurological:  Positive for weakness and numbness.  All other systems reviewed and are negative.      Objective:   Physical Exam   Physical Exam   Gen: no distress, normal appearing HEENT: oral mucosa pink and moist, NCAT Chest: normal effort, normal rate of breathing Abd: soft, non-distended Ext: no edema Psych: pleasant, normal affect Skin: intact Neuro: Alert and awake, follows commands, cranial nerves II through XII grossly intact, normal speech and language RUE: 5/5 Deltoid, 5/5 Biceps, 5/5 Triceps, 5/5 Wrist Ext, 5/5 Grip LUE: 5/5 Deltoid, 5/5 Biceps, 5/5 Triceps, 5/5 Wrist Ext, 5/5 Grip RLE: HF 4/5, KE 4/5, ADF 4/5, APF 4/5 LLE: HF 4-/5, KE 4-/5, ADF 4/5, APF 4/5 Sensory exam normal for light touch and pain in all 4 limbs.  No abnormal tone noted Musculoskeletal:   B/L knee TTP left greater than right Minimal L-spine TTP B/l TKA incision scars- healed, mild swelling around her knees b/l Mild diffuse tenderness throughout b/l UE , hips, ankles        Assessment & Plan:   Assessment 1) B/L knee pain s/p b/l Knee TKA 2) Chronic lower back pain  3) Polyarthralgia    Plan --Continue UDS and pill counts.  Continue PDMP monitoring.  Pain contract completed prior visit. -Discussed bringing pill bottle with any medications even if empty to all appointments -DC tylenol #3 BID PRN-sedation related -Last visit ordered Butrans 5 mcg/h patch-she did not try this after reading warning label.  She does not want any new medications today. -Exercises for b/l Knees  provided-advised to complete three times a week - She reports she did not have improvement after trying this  -Consider Genicular nerve blocks -she would like to speak with Dr. Wynn Banker before considering, will  ask for her to be scheduled for consultation -Verbal warning prior visit for bring in pills to each visit

## 2023-12-06 ENCOUNTER — Encounter: Payer: Self-pay | Admitting: Physical Medicine & Rehabilitation

## 2023-12-06 ENCOUNTER — Encounter: Attending: Physical Medicine & Rehabilitation | Admitting: Physical Medicine & Rehabilitation

## 2023-12-06 VITALS — BP 133/74 | HR 73 | Ht 65.0 in | Wt 212.0 lb

## 2023-12-06 DIAGNOSIS — G8929 Other chronic pain: Secondary | ICD-10-CM | POA: Insufficient documentation

## 2023-12-06 DIAGNOSIS — M25561 Pain in right knee: Secondary | ICD-10-CM | POA: Diagnosis not present

## 2023-12-06 DIAGNOSIS — M25562 Pain in left knee: Secondary | ICD-10-CM | POA: Insufficient documentation

## 2023-12-06 NOTE — Progress Notes (Signed)
 Subjective:    Patient ID: Peggy [name], female    DOB: Feb 26, 1946, 78 y.o.   MRN: [id number]  HPI 78 year old female with history of chronic low back pain coronary artery disease DVT kidney stones hypertension and bladder cancer who is referred by Dr. Benjie Karvonen to evaluate interventional pain options for left greater than right knee pain.  She has had bilateral TKR's with revision x 2 on each knee.  She has chronic severe pain in the left greater than right knee. She has tried narcotic analgesics resulting in GI upset.  Has not tried TCAs or SNRIs.  Tried physical therapy some years ago.  She did have some knee injections prior to surgery but of course intra-articular knee injections since surgery Her back pain does not seem to radiate into the knees. Pain Inventory Average Pain 10 Pain Right Now 10 My pain is constant, sharp, burning, and aching  In the last 24 hours, has pain interfered with the following? General activity 10 Relation with others 0 Enjoyment of life 0 What TIME of day is your pain at its worst? morning , daytime, evening, and night Sleep (in general) Poor  Pain is worse with: walking, bending, and standing Pain improves with:  nothing Relief from Meds:  .  Family History  Problem Relation Age of Onset   Heart failure Mother    Cancer Mother    Hypertension Mother    Hypertension Father    Heart attack Father        Noted on cardiology intake form as "massive heart attack" at age 67   Heart failure Sister    Heart attack Sister        Not listed on her cardiology intake form   Breast cancer Sister    Heart failure Sister    Heart attack Sister        Not listed on her cardiology intake   Cancer Sister    Breast cancer Sister    Social History   Socioeconomic History   Marital status: Single    Spouse name: Not on file   Number of children: 1   Years of education: Not on file   Highest education level: Not on file  Occupational History   Occupation:  Retired    Associate Professor: RETIRED    Comment: Psychiatric nurse   Tobacco Use   Smoking status: Never   Smokeless tobacco: Never  Vaping Use   Vaping status: Never Used  Substance and Sexual Activity   Alcohol use: No   Drug use: No   Sexual activity: Not Currently  Other Topics Concern   Not on file  Social History Narrative   Relocated from [place]   She is currently "'.  She lives with her son (who is autistic).  She herself is disabled, and not working.   She never smoked and never drank alcohol.   She enjoys walking 7 days a week.   Social Drivers of Corporate investment banker Strain: Low Risk  (06/20/2023)   Overall Financial Resource Strain (CARDIA)    Difficulty of Paying Living Expenses: Not hard at all  Food Insecurity: No Food Insecurity (06/20/2023)   Hunger Vital Sign    Worried About Running Out of Food in the Last Year: Never true    Ran Out of Food in the Last Year: Never true  Transportation Needs: No Transportation Needs (06/20/2023)   PRAPARE - Administrator, Civil Service (Medical): No  Lack of Transportation (Non-Medical): No  Physical Activity: Inactive (06/20/2023)   Exercise Vital Sign    Days of Exercise per Week: 0 days    Minutes of Exercise per Session: 0 min  Stress: No Stress Concern Present (06/20/2023)   [employer] of Occupational Health - Occupational Stress Questionnaire    Feeling of Stress : Not at all  Social Connections: Socially Isolated (06/20/2023)   Social Connection and Isolation Panel [NHANES]    Frequency of Communication with Friends and Family: More than three times a week    Frequency of Social Gatherings with Friends and Family: Twice a week    Attends Religious Services: Never    Database administrator or Organizations: No    Attends Engineer, structural: Never    Marital Status: Never married   Past Surgical History:  Procedure Laterality Date   ABDOMINAL HYSTERECTOMY  1980's   APPENDECTOMY  Right    BACK SURGERY     x 2, lower   BLADDER TUMOR EXCISION     BREAST BIOPSY Right    BREAST EXCISIONAL BIOPSY Right    CAROTID ARTERY DOPPLERS   10/2017   Carotid Dopplers: 40-59% stenosis on the Right Internal Carotid, < 40% stenosis in the Left.  Both vertebral arteries show normal flow.  Left subclavian artery is normal, but there is some flow disturbance in the right subclavian artery.   CATARACT EXTRACTION Left    2024   COLONOSCOPY WITH PROPOFOL N/A 02/26/2015   Procedure: COLONOSCOPY WITH PROPOFOL;  Surgeon: Jeani Hawking, MD;  Location: [facility];  Service: Endoscopy;  Laterality: N/A;   COLONOSCOPY WITH PROPOFOL N/A 12/06/2017   Procedure: COLONOSCOPY WITH PROPOFOL;  Surgeon: Charna Elizabeth, MD;  Location: [facility];  Service: Endoscopy;  Laterality: N/A;   CORONARY CALCIUM SCORE & CT ANGIOGRAM  11/2017   Coronary calcium score 131 (moderate risk).  Moderate proximal LAD & LCx stenosis --> LOW RISK STUDY.  No evidence to suspect cardiac related chest pain --> correlates with cardiac catheterizations   ENTEROSCOPY N/A 12/14/2017   Procedure: ENTEROSCOPY;  Surgeon: Jeani Hawking, MD;  Location: [facility];  Service: Endoscopy;  Laterality: N/A;   ESOPHAGOGASTRODUODENOSCOPY Left 02/20/2015   Procedure: ESOPHAGOGASTRODUODENOSCOPY (EGD);  Surgeon: Rachael Fee, MD;  Location: [facility] ENDOSCOPY;  Service: Endoscopy;  Laterality: Left;   ESOPHAGOGASTRODUODENOSCOPY (EGD) WITH PROPOFOL N/A 12/06/2017   Procedure: ESOPHAGOGASTRODUODENOSCOPY (EGD) WITH PROPOFOL;  Surgeon: Charna Elizabeth, MD;  Location: [facility];  Service: Endoscopy;  Laterality: N/A;   HOT HEMOSTASIS N/A 12/14/2017   Procedure: HOT HEMOSTASIS (ARGON PLASMA COAGULATION/BICAP);  Surgeon: Jeani Hawking, MD;  Location: [facility];  Service: Endoscopy;  Laterality: N/A;   JOINT REPLACEMENT     KNEE ARTHROSCOPY Right ~ 2000   LEFT HEART CATH AND CORONARY ANGIOGRAPHY  11/'05; 2/'12   Angiographically normal coronary  arteries   LUMBAR DISC SURGERY  1980's X 1; ~ 2002   MEDIAL PARTIAL KNEE REPLACEMENT Right 2001   NM MYOVIEW LTD  02/2015   EF 65%.  No reversible ischemia.  Possible scar along the cardiac apex and anteroseptal wall.  (Mild severity, medium-sized defect in the apex anteroseptal wall noted on both resting and stress images).  No inducible ischemia.  Read as LOW RISK   NM MYOVIEW LTD  12/09/2020   LOW RISK.  EF 55 to 65%.  No ischemia or infarct.   PARTIAL KNEE ARTHROPLASTY Left 2009   REPLACEMENT TOTAL KNEE BILATERAL Bilateral    REVISION TOTAL KNEE  ARTHROPLASTY Left    SKIN CANCER EXCISION Right    "abdomen"   TRANSTHORACIC ECHOCARDIOGRAM  06/'16; 2/'19; 1/'21    A) mod Conc LVH. EF 60-65%.  No RWMA. GR 1 D. Mild Ao Stenosis. Mild LA dilation.;  B) EF 60-65%.  Mild LVH.  No or W MA.  No significant valve disease.;l 01/2020: Normal LV systolic function.  Mild LVH.  GR 1 DD.  EF 60 to 65%.  No R WMA.  Trace AI.  No AS   Past Surgical History:  Procedure Laterality Date   ABDOMINAL HYSTERECTOMY  1980's   APPENDECTOMY Right    BACK SURGERY     x 2, lower   BLADDER TUMOR EXCISION     BREAST BIOPSY Right    BREAST EXCISIONAL BIOPSY Right    CAROTID ARTERY DOPPLERS   10/2017   Carotid Dopplers: 40-59% stenosis on the Right Internal Carotid, < 40% stenosis in the Left.  Both vertebral arteries show normal flow.  Left subclavian artery is normal, but there is some flow disturbance in the right subclavian artery.   CATARACT EXTRACTION Left    2024   COLONOSCOPY WITH PROPOFOL N/A 02/26/2015   Procedure: COLONOSCOPY WITH PROPOFOL;  Surgeon: Jeani Hawking, MD;  Location: [facility];  Service: Endoscopy;  Laterality: N/A;   COLONOSCOPY WITH PROPOFOL N/A 12/06/2017   Procedure: COLONOSCOPY WITH PROPOFOL;  Surgeon: Charna Elizabeth, MD;  Location: [facility];  Service: Endoscopy;  Laterality: N/A;   CORONARY CALCIUM SCORE & CT ANGIOGRAM  11/2017   Coronary calcium score 131 (moderate risk).  Moderate  proximal LAD & LCx stenosis --> LOW RISK STUDY.  No evidence to suspect cardiac related chest pain --> correlates with cardiac catheterizations   ENTEROSCOPY N/A 12/14/2017   Procedure: ENTEROSCOPY;  Surgeon: Jeani Hawking, MD;  Location: [facility];  Service: Endoscopy;  Laterality: N/A;   ESOPHAGOGASTRODUODENOSCOPY Left 02/20/2015   Procedure: ESOPHAGOGASTRODUODENOSCOPY (EGD);  Surgeon: Rachael Fee, MD;  Location: [facility] ENDOSCOPY;  Service: Endoscopy;  Laterality: Left;   ESOPHAGOGASTRODUODENOSCOPY (EGD) WITH PROPOFOL N/A 12/06/2017   Procedure: ESOPHAGOGASTRODUODENOSCOPY (EGD) WITH PROPOFOL;  Surgeon: Charna Elizabeth, MD;  Location: [facility];  Service: Endoscopy;  Laterality: N/A;   HOT HEMOSTASIS N/A 12/14/2017   Procedure: HOT HEMOSTASIS (ARGON PLASMA COAGULATION/BICAP);  Surgeon: Jeani Hawking, MD;  Location: [facility];  Service: Endoscopy;  Laterality: N/A;   JOINT REPLACEMENT     KNEE ARTHROSCOPY Right ~ 2000   LEFT HEART CATH AND CORONARY ANGIOGRAPHY  11/'05; 2/'12   Angiographically normal coronary arteries   LUMBAR DISC SURGERY  1980's X 1; ~ 2002   MEDIAL PARTIAL KNEE REPLACEMENT Right 2001   NM MYOVIEW LTD  02/2015   EF 65%.  No reversible ischemia.  Possible scar along the cardiac apex and anteroseptal wall.  (Mild severity, medium-sized defect in the apex anteroseptal wall noted on both resting and stress images).  No inducible ischemia.  Read as LOW RISK   NM MYOVIEW LTD  12/09/2020   LOW RISK.  EF 55 to 65%.  No ischemia or infarct.   PARTIAL KNEE ARTHROPLASTY Left 2009   REPLACEMENT TOTAL KNEE BILATERAL Bilateral    REVISION TOTAL KNEE ARTHROPLASTY Left    SKIN CANCER EXCISION Right    "abdomen"   TRANSTHORACIC ECHOCARDIOGRAM  06/'16; 2/'19; 1/'21    A) mod Conc LVH. EF 60-65%.  No RWMA. GR 1 D. Mild Ao Stenosis. Mild LA dilation.;  B) EF 60-65%.  Mild LVH.  No or W MA.  No significant valve disease.;l 01/2020: Normal LV systolic function.  Mild LVH.  GR 1 DD.  EF 60  to 65%.  No R WMA.  Trace AI.  No AS   Past Medical History:  Diagnosis Date   Anxiety    Arthritis    "all over"   Asthma    Bladder cancer (HCC)    Chest pain at rest 02/18/2015   Chronic bronchitis [facility])    'get it q yr"   Chronic lower back pain    Coronary artery disease, non-occlusive 2005   Cardiac catheter 2005 and 2012 for "anginal symptoms "showed angiographically normal coronary arteries. -->  Negative Myoview June 2016.  Coronary calcification noted on chest CT -> calcium score 131, moderate proximal LAD and LCx disease on coronary CTA - (2019)   Daily headache    Deafness in left ear    Depression    DVT (deep venous thrombosis) (HCC) 2009/2010   "probably left leg"    GERD (gastroesophageal reflux disease)    History of kidney stones    Hyperlipidemia with target LDL less than 100    Hypertension    Leg swelling 12/12/2017   Nummular eczema    Sciatica 12/10/2012   Skin cancer    "right abdomen"   BP 133/74   Pulse 73   Ht 5\' 5"  (1.651 m)   Wt 212 lb (96.2 kg)   SpO2 98%   BMI 35.28 kg/m   Opioid Risk Score:   Fall Risk Score:  `1  Depression screen [facility] 2/9     09/21/2023    9:34 AM 08/10/2023   11:24 AM 06/20/2023    8:28 AM 10/16/2022    9:49 AM 06/01/2022    9:20 AM 04/12/2022    8:45 AM 05/26/2021   10:33 AM  Depression screen PHQ 2/9  Decreased Interest 0 0 0 0 0 0 0  Down, Depressed, Hopeless 0 0 0 0 0 0 0  PHQ - 2 Score 0 0 0 0 0 0 0  Altered sleeping  3 0      Tired, decreased energy  0 0      Change in appetite  2 0      Feeling bad or failure about yourself   0 0      Trouble concentrating  0 0      Moving slowly or fidgety/restless  0 0      Suicidal thoughts   0      PHQ-9 Score  5 0      Difficult doing work/chores   Not difficult at all         Review of Systems  Musculoskeletal:        Bilateral knee pain  All other systems reviewed and are negative.     Objective:   Physical Exam General No acute distress Mood and affect  appropriate Extremities without edema Left knee multiple surgical incisions well-healed there is chronic bony enlargement.  Mild valgus deformity.  No tenderness along the medial or lateral joint line no tenderness along the patella tendon. Right knee multiple surgical incisions well-healed.  No valgus deformity no tenderness along the medial or lateral joint lines no tenderness along the patella tendon.  L Knee -30 deg ext, +75 deg flex  R knee  -10 deg ext, +110 deg flex     Assessment & Plan:  1.  Chronic knee pain postoperative status post bilateral TKR with multiple revision surgeries.  She has  chronic postoperative knee pain which occurs in up to 30% of patients undergoing TKR especially revision procedures.  She has pain that is only partially responsive to medication management.  She has advanced age which puts her at high risk of falls and other side effects of medications that are commonly used to treat pain. I do think she would be a good candidate for genicular nerve blocks.  Would like to get some updated x-rays to see if there is any hardware abnormalities that may explain her pain.

## 2023-12-06 NOTE — Patient Instructions (Signed)
 [address] [employer]

## 2023-12-11 ENCOUNTER — Other Ambulatory Visit: Payer: Self-pay | Admitting: Physical Medicine & Rehabilitation

## 2023-12-11 ENCOUNTER — Ambulatory Visit
Admission: RE | Admit: 2023-12-11 | Discharge: 2023-12-11 | Disposition: A | Discharge status: Home / Self Care | Source: Ambulatory Visit | Attending: Physical Medicine & Rehabilitation | Admitting: Physical Medicine & Rehabilitation

## 2023-12-11 DIAGNOSIS — G8929 Other chronic pain: Secondary | ICD-10-CM

## 2023-12-11 DIAGNOSIS — Z96653 Presence of artificial knee joint, bilateral: Secondary | ICD-10-CM | POA: Diagnosis not present

## 2023-12-11 DIAGNOSIS — T84098A Other mechanical complication of other internal joint prosthesis, initial encounter: Secondary | ICD-10-CM | POA: Diagnosis not present

## 2023-12-24 ENCOUNTER — Encounter: Payer: Self-pay | Admitting: Nurse Practitioner

## 2023-12-24 ENCOUNTER — Ambulatory Visit: Payer: 59 | Admitting: Nurse Practitioner

## 2023-12-24 VITALS — BP 120/60 | HR 73 | Temp 98.5°F | Ht 65.0 in | Wt 214.8 lb

## 2023-12-24 DIAGNOSIS — R238 Other skin changes: Secondary | ICD-10-CM

## 2023-12-24 DIAGNOSIS — Z6835 Body mass index (BMI) 35.0-35.9, adult: Secondary | ICD-10-CM

## 2023-12-24 DIAGNOSIS — J302 Other seasonal allergic rhinitis: Secondary | ICD-10-CM | POA: Diagnosis not present

## 2023-12-24 DIAGNOSIS — I10 Essential (primary) hypertension: Secondary | ICD-10-CM | POA: Diagnosis not present

## 2023-12-24 DIAGNOSIS — E785 Hyperlipidemia, unspecified: Secondary | ICD-10-CM

## 2023-12-24 DIAGNOSIS — E559 Vitamin D deficiency, unspecified: Secondary | ICD-10-CM | POA: Diagnosis not present

## 2023-12-24 DIAGNOSIS — Z2821 Immunization not carried out because of patient refusal: Secondary | ICD-10-CM

## 2023-12-24 DIAGNOSIS — E782 Mixed hyperlipidemia: Secondary | ICD-10-CM | POA: Diagnosis not present

## 2023-12-24 DIAGNOSIS — I700 Atherosclerosis of aorta: Secondary | ICD-10-CM | POA: Diagnosis not present

## 2023-12-24 MED ORDER — HYDROCORTISONE 1 % EX CREA
TOPICAL_CREAM | CUTANEOUS | 1 refills | Status: AC
Start: 2023-12-24 — End: 2024-12-23

## 2023-12-24 MED ORDER — LORATADINE 10 MG PO TABS: 10.0000 mg | ORAL_TABLET | Freq: Every day | ORAL | 1 refills | Status: AC

## 2023-12-24 NOTE — Progress Notes (Signed)
 [place], CMA,acting as a Neurosurgeon for Susanna Epley, FNP.,have documented all relevant documentation on the behalf of Susanna Epley, FNP,as directed by  Susanna Epley, FNP while in the presence of Susanna Epley, FNP.  Subjective:  Patient ID: Peggy [name] , female    DOB: [birth year] , 78 y.o.   MRN: [id number]  Chief Complaint  Patient presents with   Hypertension    HPI  Patient presents today for a bp and cholestrol follow up, Patient reports compliance with medication. Patient denies any chest pain, SOB, or headaches. Patient would like something for allergies. She has seen Dr. Chari Como - she was going to have injections to her left knee but she changed her mind.      Past Medical History:  Diagnosis Date   Anxiety    Arthritis    "all over"   Asthma    Bladder cancer (HCC)    Chest pain at rest 02/18/2015   Chronic bronchitis [facility])    'get it q yr"   Chronic lower back pain    Coronary artery disease, non-occlusive 2005   Cardiac catheter 2005 and 2012 for "anginal symptoms "showed angiographically normal coronary arteries. -->  Negative Myoview  June 2016.  Coronary calcification noted on chest CT -> calcium  score 131, moderate proximal LAD and LCx disease on coronary CTA - (2019)   Daily headache    Deafness in left ear    Depression    DVT (deep venous thrombosis) (HCC) 2009/2010   "probably left leg"    GERD (gastroesophageal reflux disease)    History of kidney stones    Hyperlipidemia with target LDL less than 100    Hypertension    Leg swelling 12/12/2017   Nummular eczema    Sciatica 12/10/2012   Skin cancer    "right abdomen"     Family History  Problem Relation Age of Onset   Heart failure Mother    Cancer Mother    Hypertension Mother    Hypertension Father    Heart attack Father        Noted on cardiology intake form as "massive heart attack" at age 28   Heart failure Sister    Heart attack Sister        Not listed on her cardiology intake form    Breast cancer Sister    Heart failure Sister    Heart attack Sister        Not listed on her cardiology intake   Cancer Sister    Breast cancer Sister      Current Outpatient Medications:    acetaminophen  (TYLENOL ) 500 MG tablet, Take 1,000 mg by mouth 2 (two) times daily as needed for moderate pain or headache. , Disp: , Rfl:    amLODipine  (NORVASC ) 2.5 MG tablet, Take 1 tablet (2.5 mg total) by mouth daily., Disp: 90 tablet, Rfl: 3   diazepam  (VALIUM ) 5 MG tablet, Take 5 mg by mouth every 6 (six) hours as needed., Disp: , Rfl:    diphenhydrAMINE  (BENADRYL ) 25 mg capsule, Take 25 mg by mouth daily as needed for allergies., Disp: , Rfl:    ezetimibe  (ZETIA ) 10 MG tablet, Take 1 tablet (10 mg total) by mouth daily., Disp: 90 tablet, Rfl: 3   FLUoxetine  (PROZAC ) 40 MG capsule, Take 40 mg by mouth daily as needed (depression)., Disp: , Rfl:    furosemide  (LASIX ) 40 MG tablet, Take 1 tablet (40 mg total) by mouth daily., Disp: 90 tablet, Rfl: 3  hydrocortisone  cream 1 %, Apply to affected area 2 times daily, Disp: 30 g, Rfl: 1   loratadine  (CLARITIN ) 10 MG tablet, Take 1 tablet (10 mg total) by mouth daily., Disp: 90 tablet, Rfl: 1   metoprolol  succinate (TOPROL -XL) 25 MG 24 hr tablet, TAKE 1 TABLET(25 MG) BY MOUTH DAILY, Disp: 90 tablet, Rfl: 2   nitroGLYCERIN  (NITROSTAT ) 0.4 MG SL tablet, PLACE 1 TABLET UNDER TONGUE EVERY 5 MINUTES AS NEEDED FOR CHEST PAIN, Disp: 75 tablet, Rfl: 2   simvastatin  (ZOCOR ) 80 MG tablet, TAKE 1 TABLET(80 MG) BY MOUTH AT BEDTIME, Disp: 90 tablet, Rfl: 1   trandolapril  (MAVIK ) 2 MG tablet, TAKE 1 TABLET(2 MG) BY MOUTH DAILY, Disp: 90 tablet, Rfl: 1   valACYclovir  (VALTREX ) 1000 MG tablet, TAKE 1/2 TABLET BY MOUTH THREE TIMES DAILY AS NEEDED FOR OUTBREAK, Disp: 135 tablet, Rfl: 2   Vitamin D , Ergocalciferol , (DRISDOL ) 1.25 MG (50000 UNIT) CAPS capsule, TAKE 1 CAPSULE BY MOUTH 2 TIMES A WEEK, Disp: 5 capsule, Rfl: 3   Allergies  Allergen Reactions   Penicillins  Shortness Of Breath, Itching, Swelling, Rash and Other (See Comments)    Has patient had a PCN reaction causing immediate rash, facial/tongue/throat swelling, SOB or lightheadedness with hypotension: Yes Has patient had a PCN reaction causing severe rash involving mucus membranes or skin necrosis: No Has patient had a PCN reaction that required hospitalization: Yes Has patient had a PCN reaction occurring within the last 10 years: No If all of the above answers are "NO", then may proceed with Cephalosporin use.    Azithromycin Itching, Swelling and Rash   Levaquin [Levofloxacin In D5w] Hives, Itching, Swelling and Rash   Clindamycin/Lincomycin Hives   Erythromycin Base Itching   Keflex [Cephalexin] Itching and Rash   Sulfa Antibiotics Itching and Rash   Tetracyclines & Related Itching and Rash     Review of Systems  Constitutional: Negative.   HENT: Negative.    Eyes: Negative.   Respiratory: Negative.    Cardiovascular: Negative.  Negative for chest pain.  Gastrointestinal: Negative.   Skin:  Positive for rash (bilateral hands and "all over my body").  Neurological: Negative.   Psychiatric/Behavioral: Negative.       Today's Vitals   12/24/23 0947  BP: 120/60  Pulse: 73  Temp: 98.5 F (36.9 C)  TempSrc: Oral  Weight: 214 lb 12.8 oz (97.4 kg)  Height: 5\' 5"  (1.651 m)  PainSc: 10-Worst pain ever  PainLoc: Knee   Body mass index is 35.74 kg/m.  Wt Readings from Last 3 Encounters:  12/24/23 214 lb 12.8 oz (97.4 kg)  12/06/23 212 lb (96.2 kg)  11/27/23 212 lb 12.8 oz (96.5 kg)      Objective:  Physical Exam Vitals and nursing note reviewed.  Constitutional:      General: She is not in acute distress.    Appearance: Normal appearance. She is obese.  Cardiovascular:     Rate and Rhythm: Normal rate and regular rhythm.     Pulses: Normal pulses.     Heart sounds: Normal heart sounds. No murmur heard. Pulmonary:     Effort: Pulmonary effort is normal. No  respiratory distress.     Breath sounds: Normal breath sounds. No wheezing.  Musculoskeletal:     Right lower leg: Edema (trace) present.     Left lower leg: Edema (trace) present.  Skin:    General: Skin is warm and dry.     Capillary Refill: Capillary refill takes less than 2  seconds.     Findings: Rash (dry rash to bilateral palms of hand) present.  Neurological:     General: No focal deficit present.     Mental Status: She is alert and oriented to person, place, and time.     Cranial Nerves: No cranial nerve deficit.     Motor: No weakness.  Psychiatric:        Mood and Affect: Mood normal.        Behavior: Behavior normal.        Thought Content: Thought content normal.        Judgment: Judgment normal.         Assessment And Plan:  Essential hypertension Assessment & Plan: Blood pressure is well controlled, continue current medications.   Orders: -     BMP8+eGFR  Mixed hyperlipidemia Assessment & Plan: Cholesterol levels are stable. Continue statin.   Orders: -     Lipid panel  COVID-19 vaccination declined Assessment & Plan: Declines covid 19 vaccine. Discussed risk of covid 52 and if she changes her mind about the vaccine to call the office. Education has been provided regarding the importance of this vaccine but patient still declined. Advised may receive this vaccine at local pharmacy or Health Dept.or vaccine clinic. Aware to provide a copy of the vaccination record if obtained from local pharmacy or Health Dept.  Encouraged to take multivitamin, vitamin d , vitamin c and zinc to increase immune system. Aware can call office if would like to have vaccine here at office. Verbalized acceptance and understanding.    BMI 35.0-35.9,adult  Atherosclerosis of aorta (HCC) Assessment & Plan: Continue statin, tolerating well   Vitamin D  deficiency Assessment & Plan: Will check vitamin D  level and will send refill for her vitamin d  once get results.      Also  encouraged to spend 15 minutes in the sun daily.    Other skin changes Assessment & Plan: She would like a referral to Dermatology for evaluation. Reports rash on hands. Will check labs as well.   Orders: -     Ambulatory referral to Dermatology -     Hydrocortisone ; Apply to affected area 2 times daily  Dispense: 30 g; Refill: 1 -     T pallidum Screening [place]  Obesity, morbid (HCC) Assessment & Plan: She is encouraged to strive for BMI less than 30 to decrease cardiac risk. Advised to aim for at least 150 minutes of exercise per week.    Seasonal allergies Assessment & Plan: Rx for antihistamine sent to pharmacy   Other orders -     Loratadine ; Take 1 tablet (10 mg total) by mouth daily.  Dispense: 90 tablet; Refill: 1 -     RPR titer (reflex) -     RPR, Quant    Return for keep same next.  Patient was given opportunity to ask questions. Patient verbalized understanding of the plan and was able to repeat key elements of the plan. All questions were answered to their satisfaction.    Inge Mangle, FNP, have reviewed all documentation for this visit. The documentation on 12/24/23 for the exam, diagnosis, procedures, and orders are all accurate and complete.   IF YOU HAVE BEEN REFERRED TO A SPECIALIST, IT MAY TAKE 1-2 WEEKS TO SCHEDULE/PROCESS THE REFERRAL. IF YOU HAVE NOT HEARD FROM US /SPECIALIST IN TWO WEEKS, PLEASE GIVE US  A CALL AT [phone].

## 2023-12-24 NOTE — Assessment & Plan Note (Signed)
 Continue statin, tolerating well

## 2023-12-24 NOTE — Assessment & Plan Note (Addendum)
 Blood pressure is well controlled, continue current medications.

## 2023-12-24 NOTE — Assessment & Plan Note (Signed)
Will check vitamin D level and will send refill for her vitamin d once get results.      Also encouraged to spend 15 minutes in the sun daily.

## 2023-12-24 NOTE — Assessment & Plan Note (Signed)
 Cholesterol levels are stable. Continue statin

## 2023-12-26 LAB — LIPID PANEL
Chol/HDL Ratio: 2.5 ratio (ref 0.0–4.4)
Cholesterol, Total: 158 mg/dL (ref 100–199)
HDL: 64 mg/dL (ref >39)
LDL Chol Calc (NIH): 79 mg/dL (ref 0–99)
Triglycerides: 77 mg/dL (ref 0–149)
VLDL Cholesterol Cal: 15 mg/dL (ref 5–40)

## 2023-12-26 LAB — BMP8+EGFR
BUN/Creatinine Ratio: 17 (ref 12–28)
BUN: 16 mg/dL (ref 8–27)
CO2: 22 mmol/L (ref 20–29)
Calcium: 9.3 mg/dL (ref 8.7–10.3)
Chloride: 106 mmol/L (ref 96–106)
Creatinine, Ser: 0.96 mg/dL (ref 0.57–1.00)
Glucose: 90 mg/dL (ref 70–99)
Potassium: 4.8 mmol/L (ref 3.5–5.2)
Sodium: 143 mmol/L (ref 134–144)
eGFR: 60 mL/min/{1.73_m2} (ref >59)

## 2023-12-26 LAB — T PALLIDUM SCREENING CASCADE: T pallidum Antibodies (TP-PA): REACTIVE — AB

## 2023-12-26 LAB — RPR, QUANT: RPR, Quant: 1:1 {titer} — ABNORMAL HIGH

## 2023-12-26 LAB — RPR TITER (REFLEX): RPR: REACTIVE — AB

## 2023-12-31 DIAGNOSIS — J302 Other seasonal allergic rhinitis: Secondary | ICD-10-CM | POA: Insufficient documentation

## 2023-12-31 DIAGNOSIS — Z6835 Body mass index (BMI) 35.0-35.9, adult: Secondary | ICD-10-CM | POA: Insufficient documentation

## 2023-12-31 DIAGNOSIS — Z2821 Immunization not carried out because of patient refusal: Secondary | ICD-10-CM | POA: Insufficient documentation

## 2023-12-31 NOTE — Assessment & Plan Note (Signed)
 Rx for antihistamine sent to pharmacy

## 2023-12-31 NOTE — Assessment & Plan Note (Signed)

## 2023-12-31 NOTE — Assessment & Plan Note (Signed)
 She would like a referral to Dermatology for evaluation. Reports rash on hands. Will check labs as well.

## 2023-12-31 NOTE — Assessment & Plan Note (Signed)
 She is encouraged to strive for BMI less than 30 to decrease cardiac risk. Advised to aim for at least 150 minutes of exercise per week.

## 2024-01-04 ENCOUNTER — Ambulatory Visit: Admitting: Physical Medicine & Rehabilitation

## 2024-01-22 ENCOUNTER — Telehealth: Payer: Self-pay | Admitting: Nurse Practitioner

## 2024-01-22 ENCOUNTER — Ambulatory Visit: Admitting: Physical Medicine & Rehabilitation

## 2024-01-22 NOTE — Telephone Encounter (Signed)
 Left message for patient to return call to discuss her labs.

## 2024-04-07 ENCOUNTER — Other Ambulatory Visit: Payer: Self-pay | Admitting: Physical Medicine & Rehabilitation

## 2024-04-24 ENCOUNTER — Other Ambulatory Visit: Payer: Self-pay | Admitting: Nurse Practitioner

## 2024-04-30 ENCOUNTER — Emergency Department (HOSPITAL_COMMUNITY)

## 2024-04-30 ENCOUNTER — Other Ambulatory Visit: Payer: Self-pay

## 2024-04-30 ENCOUNTER — Emergency Department (HOSPITAL_COMMUNITY)
Admission: EM | Admit: 2024-04-30 | Discharge: 2024-04-30 | Disposition: A | Discharge status: Home / Self Care | Source: Ambulatory Visit | Attending: Emergency Medicine | Admitting: Emergency Medicine

## 2024-04-30 ENCOUNTER — Encounter (HOSPITAL_COMMUNITY): Payer: Self-pay

## 2024-04-30 DIAGNOSIS — M19041 Primary osteoarthritis, right hand: Secondary | ICD-10-CM | POA: Diagnosis not present

## 2024-04-30 DIAGNOSIS — Z743 Need for continuous supervision: Secondary | ICD-10-CM | POA: Diagnosis not present

## 2024-04-30 DIAGNOSIS — E785 Hyperlipidemia, unspecified: Secondary | ICD-10-CM | POA: Diagnosis not present

## 2024-04-30 DIAGNOSIS — K5900 Constipation, unspecified: Secondary | ICD-10-CM | POA: Diagnosis not present

## 2024-04-30 DIAGNOSIS — Z79899 Other long term (current) drug therapy: Secondary | ICD-10-CM | POA: Diagnosis not present

## 2024-04-30 DIAGNOSIS — I2510 Atherosclerotic heart disease of native coronary artery without angina pectoris: Secondary | ICD-10-CM | POA: Diagnosis not present

## 2024-04-30 DIAGNOSIS — Z1321 Encounter for screening for nutritional disorder: Secondary | ICD-10-CM | POA: Diagnosis not present

## 2024-04-30 DIAGNOSIS — R9431 Abnormal electrocardiogram [ECG] [EKG]: Secondary | ICD-10-CM | POA: Diagnosis not present

## 2024-04-30 DIAGNOSIS — R0789 Other chest pain: Secondary | ICD-10-CM | POA: Insufficient documentation

## 2024-04-30 DIAGNOSIS — Z1159 Encounter for screening for other viral diseases: Secondary | ICD-10-CM | POA: Diagnosis not present

## 2024-04-30 DIAGNOSIS — R600 Localized edema: Secondary | ICD-10-CM | POA: Diagnosis not present

## 2024-04-30 DIAGNOSIS — Z0000 Encounter for general adult medical examination without abnormal findings: Secondary | ICD-10-CM | POA: Diagnosis not present

## 2024-04-30 DIAGNOSIS — I10 Essential (primary) hypertension: Secondary | ICD-10-CM | POA: Insufficient documentation

## 2024-04-30 DIAGNOSIS — K219 Gastro-esophageal reflux disease without esophagitis: Secondary | ICD-10-CM | POA: Diagnosis not present

## 2024-04-30 DIAGNOSIS — R6884 Jaw pain: Secondary | ICD-10-CM | POA: Diagnosis not present

## 2024-04-30 DIAGNOSIS — R079 Chest pain, unspecified: Secondary | ICD-10-CM | POA: Diagnosis not present

## 2024-04-30 DIAGNOSIS — R0602 Shortness of breath: Secondary | ICD-10-CM | POA: Diagnosis not present

## 2024-04-30 DIAGNOSIS — M19042 Primary osteoarthritis, left hand: Secondary | ICD-10-CM | POA: Diagnosis not present

## 2024-04-30 LAB — COMPREHENSIVE METABOLIC PANEL WITH GFR
ALT: 12 U/L (ref 0–44)
AST: 21 U/L (ref 15–41)
Albumin: 3.6 g/dL (ref 3.5–5.0)
Alkaline Phosphatase: 102 U/L (ref 38–126)
Anion gap: 11 (ref 5–15)
BUN: 16 mg/dL (ref 8–23)
CO2: 25 mmol/L (ref 22–32)
Calcium: 8.8 mg/dL — ABNORMAL LOW (ref 8.9–10.3)
Chloride: 104 mmol/L (ref 98–111)
Creatinine, Ser: 1.27 mg/dL — ABNORMAL HIGH (ref 0.44–1.00)
GFR, Estimated: 43 mL/min — ABNORMAL LOW (ref >60)
Glucose, Bld: 83 mg/dL (ref 70–99)
Potassium: 3.4 mmol/L — ABNORMAL LOW (ref 3.5–5.1)
Sodium: 140 mmol/L (ref 135–145)
Total Bilirubin: 0.6 mg/dL (ref 0.0–1.2)
Total Protein: 7.1 g/dL (ref 6.5–8.1)

## 2024-04-30 LAB — CBC
HCT: 34.5 % — ABNORMAL LOW (ref 36.0–46.0)
Hemoglobin: 10.1 g/dL — ABNORMAL LOW (ref 12.0–15.0)
MCH: 24.8 pg — ABNORMAL LOW (ref 26.0–34.0)
MCHC: 29.3 g/dL — ABNORMAL LOW (ref 30.0–36.0)
MCV: 84.6 fL (ref 80.0–100.0)
Platelets: 331 K/uL (ref 150–400)
RBC: 4.08 MIL/uL (ref 3.87–5.11)
RDW: 17.2 % — ABNORMAL HIGH (ref 11.5–15.5)
WBC: 9.1 K/uL (ref 4.0–10.5)
nRBC: 0 % (ref 0.0–0.2)

## 2024-04-30 LAB — TROPONIN I (HIGH SENSITIVITY)
Troponin I (High Sensitivity): 10 ng/L (ref <18)
Troponin I (High Sensitivity): 11 ng/L (ref <18)

## 2024-04-30 NOTE — ED Provider Notes (Signed)
 [place] EMERGENCY DEPARTMENT AT [facility] Provider Note   CSN: [id number] Arrival date & time: 04/30/24  1031     Patient presents with: Chest Pain   Peggy [name] is a 78 y.o. female.   HPI 78 year old female history of CAD, hypertension, hyperlipidemia, SVT, AV malformation of digestive system, obesity, presents today complaining of left-sided chest pain.  She reports that she has been having chest pain coming and going over the past 2 weeks.  She describes as aching in the left side of her chest lasting seconds.  She has had some associated abdominal discomfort with nausea last night.  She was seen today at primary care at [facility] and sent to ED due to type of pain that she is having and risk factors.  She denies chest pain, lightheadedness, fever, chills.  She is followed by Dr. Anner with cardiology.    Prior to Admission medications   Medication Sig Start Date End Date Taking? Authorizing Provider  acetaminophen  (TYLENOL ) 500 MG tablet Take 1,000 mg by mouth 2 (two) times daily as needed for headache or fever (pain).   Yes [provider]  amLODipine  (NORVASC ) 2.5 MG tablet Take 1 tablet (2.5 mg total) by mouth daily. Patient taking differently: Take 2.5 mg by mouth at bedtime. 08/15/23  Yes Anner Alm ORN, MD  diazepam  (VALIUM ) 5 MG tablet Take 5 mg by mouth every 6 (six) hours as needed for anxiety. 04/04/22  Yes [provider]  diphenhydrAMINE  (BENADRYL ) 25 mg capsule Take 25 mg by mouth daily as needed for allergies.   Yes [provider]  ezetimibe  (ZETIA ) 10 MG tablet Take 1 tablet (10 mg total) by mouth daily. 08/15/23  Yes Anner Alm ORN, MD  FLUoxetine  (PROZAC ) 40 MG capsule Take 40 mg by mouth daily as needed (depression).   Yes [provider]  furosemide  (LASIX ) 40 MG tablet Take 1 tablet (40 mg total) by mouth daily. 08/15/23  Yes Anner Alm ORN, MD  hydrocortisone  cream 1 % Apply to affected area 2  times daily Patient taking differently: Apply 1 Application topically 2 (two) times daily as needed (eczema). 12/24/23 12/23/24 Yes Georgina Speaks, FNP  loratadine  (CLARITIN ) 10 MG tablet Take 1 tablet (10 mg total) by mouth daily. Patient taking differently: Take 10 mg by mouth daily as needed for allergies. 12/24/23 12/23/24 Yes Georgina Speaks, FNP  metoprolol  succinate (TOPROL -XL) 25 MG 24 hr tablet TAKE 1 TABLET(25 MG) BY MOUTH DAILY 11/09/23  Yes Anner Alm ORN, MD  nitroGLYCERIN  (NITROSTAT ) 0.4 MG SL tablet PLACE 1 TABLET UNDER TONGUE EVERY 5 MINUTES AS NEEDED FOR CHEST PAIN 09/28/23  Yes Anner Alm ORN, MD  simvastatin  (ZOCOR ) 80 MG tablet TAKE 1 TABLET(80 MG) BY MOUTH AT BEDTIME 09/21/23  Yes Moore, Janece, FNP  trandolapril  (MAVIK ) 2 MG tablet TAKE 1 TABLET(2 MG) BY MOUTH DAILY 10/16/23  Yes Georgina Speaks, FNP  valACYclovir  (VALTREX ) 1000 MG tablet TAKE 1/2 TABLET BY MOUTH THREE TIMES DAILY AS NEEDED FOR OUTBREAK 09/28/23  Yes Georgina Speaks, FNP  Vitamin D , Ergocalciferol , (DRISDOL ) 1.25 MG (50000 UNIT) CAPS capsule TAKE 1 CAPSULE BY MOUTH 2 TIMES A WEEK Patient not taking: Reported on 04/30/2024 04/24/24   Georgina Speaks, FNP    Allergies: Penicillins, Zithromax [azithromycin], Ery-tab [erythromycin], Cephalosporins, Clindamycin/lincomycin, Keflex [cephalexin], Levaquin [levofloxacin], Sulfa antibiotics, and Tetracyclines & related    Review of Systems  Updated Vital Signs BP 125/74 (BP Location: Right Arm)   Pulse 73   Temp 98  F (36.7 C) (Oral)   Resp 18   Ht 1.651 m (5' 5)   Wt 94.8 kg   SpO2 100%   BMI 34.78 kg/m   Physical Exam Vitals reviewed.  HENT:     Head: Normocephalic.  Eyes:     Extraocular Movements: Extraocular movements intact.     Pupils: Pupils are equal, round, and reactive to light.  Cardiovascular:     Rate and Rhythm: Normal rate and regular rhythm.     Heart sounds: Normal heart sounds.  Pulmonary:     Effort: Pulmonary effort is normal.     Breath  sounds: Normal breath sounds.  Abdominal:     General: Bowel sounds are normal.     Palpations: Abdomen is soft.  Musculoskeletal:        General: Normal range of motion.     Cervical back: Normal range of motion.  Skin:    General: Skin is warm.     Capillary Refill: Capillary refill takes less than 2 seconds.  Neurological:     General: No focal deficit present.     Mental Status: She is alert.  Psychiatric:        Mood and Affect: Mood normal.     (all labs ordered are listed, but only abnormal results are displayed) Labs Reviewed  CBC - Abnormal; Notable for the following components:      Result Value   Hemoglobin 10.1 (*)    HCT 34.5 (*)    MCH 24.8 (*)    MCHC 29.3 (*)    RDW 17.2 (*)    All other components within normal limits  COMPREHENSIVE METABOLIC PANEL WITH GFR - Abnormal; Notable for the following components:   Potassium 3.4 (*)    Creatinine, Ser 1.27 (*)    Calcium  8.8 (*)    GFR, Estimated 43 (*)    All other components within normal limits  TROPONIN I (HIGH SENSITIVITY)  TROPONIN I (HIGH SENSITIVITY)    EKG: EKG Interpretation Date/Time:  Wednesday April 30 2024 10:43:31 EDT Ventricular Rate:  79 PR Interval:  167 QRS Duration:  84 QT Interval:  407 QTC Calculation: 467 R Axis:   18  Text Interpretation: Sinus rhythm Low voltage, precordial leads Confirmed by Levander Houston [phone]) on 04/30/2024 10:51:33 AM  Radiology: [facility] Chest Port 1 View Result Date: 04/30/2024 CLINICAL DATA:  Chest pain EXAM: PORTABLE CHEST 1 VIEW COMPARISON:  02/12/2020 FINDINGS: Normal mediastinum and cardiac silhouette. Normal pulmonary vasculature. No evidence of effusion, infiltrate, or pneumothorax. No acute bony abnormality. IMPRESSION: No acute cardiopulmonary process. Electronically Signed   By: Jackquline Boxer M.D.   On: 04/30/2024 12:11     Procedures   Medications Ordered in the ED - No data to display  Clinical Course as of 04/30/24 1534  Wed Apr 30, 2024   1347 Metabolic panel reviewed interpreted significant for mild increased creatinine which has been elevated previously today is 1.27 [DR]  1347 CBC is reviewed interpreted significant for hemoglobin slightly decreased at 10 from baseline of 11 [DR]    Clinical Course User Index [DR] Levander Houston, MD                                 Medical Decision Making Amount and/or Complexity of Data Reviewed Labs: ordered. Radiology: ordered.   Patient seen and evaluated for chest pain.  Differential diagnosis of serious/life threatening causes of chest pain includes  ACS, other diseases of the heart such as myocarditis or pericarditis, lung etiologies such as infection or pneumothorax, diseases of the great vessels such as aortic dissection or AAA, pulmonary embolism, or GI sources such as cholecystitis or other upper abdominal causes. Doubt ACS- heart score documented, EKG reviewed, Given the timing of pain to ER presentation,  delta troponin normal so doubt NSTEMI troponin and repeat troponin obtained and WNL Doubt myocarditis/pericarditis/tamponade based on history, review of ekg and labs Doubt aortic dissection based on history and review of imaging Doubt intrinsic lung causes such as pneumonia or pneumothorax, based on history, physical exam, and studies obtained. Doubt PE based on history, physical exam, and PERC Doubt acute GI etiology requiring intervention based on history, physical exam and labs. Patient appears stable for discharge. Return precautions and need for follow up discussed and patient voices understanding      Final diagnoses:  Chest pain, unspecified type    ED Discharge Orders     None          Levander Houston, MD 04/30/24 1534

## 2024-04-30 NOTE — ED Triage Notes (Signed)
 PT BIB [facility] from [facility] physician appointment this morning. Provider referred PT to ED because PT has had intermittent chest pain for roughly 2 weeks, along with numbness in R jaw and R shoulder pain. SOB w/ exertion. Aox4. Endorses 6/10 pain.   140/82 BP, P 82, RR 18, Spo2 98% on RA.

## 2024-05-07 NOTE — Progress Notes (Deleted)
 Cardiology Office Note:  .   Date:  05/07/2024  ID:  Peggy [name], DOB [birth year], MRN [id number] PCP: Georgina Speaks, FNP  [facility] Providers Cardiologist:  Alm Clay, MD { Click to update primary MD,subspecialty MD or APP then REFRESH:1}   History of Present Illness: .   Peggy [name] is a 78 y.o. female  with history of CADNonocclusive disease by catheterization in 2005 and 2012, Coronary CTA also showed similar moderate disease in 2019. Myoview  and echo in 2022 both look pretty normal., hypertension, hyperlipidemia, SVT, AV malformation of digestive system, obesity,  Patient was in the ED 04/30/24 with left sided chest pain, felt to be low risk ACS and sent home.  ROS: ***  Studies Reviewed: SABRA         Prior CV Studies: {Select studies to display:26339}  NST 12/2020 There was no ST segment deviation noted during stress. Nuclear stress EF: 65%. The left ventricular ejection fraction is normal (55-65%). The study is normal. This is a low risk study.  Risk Assessment/Calculations:   {Does this patient have ATRIAL FIBRILLATION?:[phone]} No BP recorded.  {Refresh Note OR Click here to enter BP  :1}***       Physical Exam:   VS:  There were no vitals taken for this visit.   Orhtostatics: No data found. Wt Readings from Last 3 Encounters:  04/30/24 209 lb (94.8 kg)  12/24/23 214 lb 12.8 oz (97.4 kg)  12/06/23 212 lb (96.2 kg)    GEN: Well nourished, well developed in no acute distress NECK: No JVD; No carotid bruits CARDIAC: ***RRR, no murmurs, rubs, gallops RESPIRATORY:  Clear to auscultation without rales, wheezing or rhonchi  ABDOMEN: Soft, non-tender, non-distended EXTREMITIES:  No edema; No deformity   ASSESSMENT AND PLAN: .    Atherosclerosis of aorta (HCC)     Continue management of blood pressure and lipids as well as other risk factors.       Coronary artery disease, non-occlusive - Primary (Chronic)     Nonocclusive disease by  catheterization in 2005 and 2012, Coronary CTA also showed similar moderate disease in 2019.  Myoview  and echo in 2022 both look pretty normal.  So far everything has been nonischemic.  Was last evaluated for sound like musculoskeletal chest pain => Resolved with topical gel. No exacerbation with movement or exercise. No associated chest pain. -Continue current management. Amlodipine  5 mg along with Toprol  25 mg daily for history trandolapril  2 mg daily for afterload reduction. On Zetia  10 mg along with simvastatin  80 mg daily labs not quite at an LDL less than 70 but reasonable acceptable given her advanced age. I recommended aspirin  in the past, I do not think she is taking it every day.       Essential hypertension (Chronic)     Borderline blood pressure readings. -Continue Amlodipine  5mg , Toprol  25 mg, and trandolapril  2mg  daily. -> Is on standing dose of loop diuretic-furosemide  40 mg daily.       Relevant Orders   EKG 12-Lead (Completed)   Hyperlipidemia with target LDL less than 100 (Chronic)     Previous LDL up and down to 66, most recent LDL is little bit higher on stable dose of simvastatin  and Zetia .LDL slightly elevated at 88, but would not recommend aggressive management, given advanced age and deconditioning. -Continue Zetia  10mg  daily and Simvastatin  80mg  daily. .->  Could potentially switch from simvastatin  to more potent statin such as rosuvastatin  40 mg.  PSVT (paroxysmal supraventricular tachycardia) (HCC) (Chronic)     No prolonged episodes.  Just occasional palpitations lasting for approximately a minute, not associated with dizziness or wooziness. -Continue current management. => Low-dose Toprol  25 mg daily. -Reminded her of vagal maneuvers.         Other   Bilateral lower extremity edema (Chronic)     Again, difficult to say if this is related to diastolic heart failure, probably more related to venous stasis. Noted more in left leg (related to  prior surgery), extending to toes. Managed with daily Furosemide . -Continue Furosemide  40mg  daily. -Consider additional dose of Furosemide  if nocturnal dyspnea occurs.       Health care maintenance     -Continue current medications including Metoprolol  succinate 25mg  daily, Valtrex  1000mg  three times a day for outbreaks, and Vitamin D  1.25mg  twice a week. -If weight increases by more than 3 pounds in a day or 5 pounds in a week, take an extra dose of Lasix  until weight returns to baseline.       Orthopnea (Chronic)     Orthopnea / Paroxysmal nocturnal dyspnea -> Occasional episodes, managed by sitting up and watching TV. Only grade 1 diastolic function noted on echo with normal EF.  I am not sure if this could be related to body habitus but is also associated with some PND -Consider additional dose of Furosemide  if episodes occur.         {Are you ordering a CV Procedure (e.g. stress test, cath, DCCV, TEE, etc)?   Press F2        :[id number]}  Dispo: ***  Signed, Olivia Pavy, PA-C

## 2024-05-12 ENCOUNTER — Encounter: Payer: Self-pay | Admitting: Pharmacist

## 2024-05-12 NOTE — Progress Notes (Addendum)
   05/12/2024  Patient ID: Peggy [name], female   DOB: 1946/03/31, 78 y.o.   MRN: [id number]  Pharmacy Quality Measure Review  This patient is appearing on a report for being at risk of failing the adherence measure for cholesterol (statin) medications this calendar year and Hypertension  Medication: Simvastatin  80 mg  Last fill date: 04/30/24 for 90 day supply  Trandolopril 2 mg filled 04/30/2024  Insurance report was not up to date. No action needed at this time.    Cassius DOROTHA Brought, PharmD, BCACP Clinical Pharmacist [phone]

## 2024-05-20 ENCOUNTER — Ambulatory Visit: Admitting: Physician Assistant

## 2024-06-16 ENCOUNTER — Other Ambulatory Visit: Payer: Self-pay | Admitting: Nurse Practitioner

## 2024-06-16 DIAGNOSIS — I10 Essential (primary) hypertension: Secondary | ICD-10-CM

## 2024-06-17 ENCOUNTER — Other Ambulatory Visit: Payer: Self-pay

## 2024-06-17 MED ORDER — VALACYCLOVIR HCL 1 G PO TABS: ORAL_TABLET | ORAL | 2 refills | Status: AC

## 2024-06-25 ENCOUNTER — Encounter: Payer: 59 | Admitting: Nurse Practitioner

## 2024-06-27 ENCOUNTER — Other Ambulatory Visit: Payer: Self-pay | Admitting: Cardiology

## 2024-07-02 ENCOUNTER — Ambulatory Visit: Payer: 59

## 2024-07-14 ENCOUNTER — Ambulatory Visit: Attending: Cardiology | Admitting: Cardiology

## 2024-07-14 ENCOUNTER — Encounter: Payer: Self-pay | Admitting: Cardiology

## 2024-07-14 VITALS — BP 104/40 | HR 70 | Ht 65.0 in | Wt 209.2 lb

## 2024-07-14 DIAGNOSIS — I471 Supraventricular tachycardia, unspecified: Secondary | ICD-10-CM

## 2024-07-14 DIAGNOSIS — R011 Cardiac murmur, unspecified: Secondary | ICD-10-CM

## 2024-07-14 DIAGNOSIS — R0989 Other specified symptoms and signs involving the circulatory and respiratory systems: Secondary | ICD-10-CM

## 2024-07-14 DIAGNOSIS — R079 Chest pain, unspecified: Secondary | ICD-10-CM

## 2024-07-14 DIAGNOSIS — I700 Atherosclerosis of aorta: Secondary | ICD-10-CM

## 2024-07-14 DIAGNOSIS — R0601 Orthopnea: Secondary | ICD-10-CM

## 2024-07-14 DIAGNOSIS — I10 Essential (primary) hypertension: Secondary | ICD-10-CM

## 2024-07-14 DIAGNOSIS — E785 Hyperlipidemia, unspecified: Secondary | ICD-10-CM

## 2024-07-14 DIAGNOSIS — R0609 Other forms of dyspnea: Secondary | ICD-10-CM

## 2024-07-14 DIAGNOSIS — I492 Junctional premature depolarization: Secondary | ICD-10-CM | POA: Insufficient documentation

## 2024-07-14 DIAGNOSIS — I2510 Atherosclerotic heart disease of native coronary artery without angina pectoris: Secondary | ICD-10-CM | POA: Diagnosis not present

## 2024-07-14 DIAGNOSIS — R600 Localized edema: Secondary | ICD-10-CM

## 2024-07-14 NOTE — Patient Instructions (Addendum)
 Medication Instructions:   No change *If you need a refill on your cardiac medications before your next appointment, please call your pharmacy*   Lab Work: Not needed    Testing/Procedures: A cardiac PET stress test, also known as a myocardial perfusion scan, is a noninvasive procedure that measures blood flow to the heart muscle:  How it works A radioactive tracer is injected into a vein, and a PET scanner creates images of the heart. A medication is used to increase blood flow to the heart during the test.  What it can detect A cardiac PET stress test can help determine if the heart muscle is healthy, if there is damage or scar tissue, or if there is a buildup of abnormal substances. It can also detect disease in small blood vessels that are not visible on an angiogra    Follow-Up: At [facility], you and your health needs are our priority.  As part of our continuing mission to provide you with exceptional heart care, we have created designated Provider Care Teams.  These Care Teams include your primary Cardiologist (physician) and Advanced Practice Providers (APPs -  Physician Assistants and Nurse Practitioners) who all work together to provide you with the care you need, when you need it.     Your next appointment:   4 month(s)  The format for your next appointment:   In Person  Provider:   Alm Clay, MD   Other Instructions     Please report to Radiology at the [facility] Main Entrance 30 minutes early for your test.  [address] [place], [place] [address]              How to Prepare for Your Cardiac PET/CT Stress Test:  Nothing to eat or drink, except water , 3 hours prior to arrival time.  NO caffeine/decaffeinated products, or chocolate 12 hours prior to arrival. (Please note decaffeinated beverages (teas/coffees) still contain caffeine).  If you have caffeine within 12 hours prior, the test will need to be rescheduled.  Medication  instructions: Do not take erectile dysfunction medications for 72 hours prior to test (sildenafil, tadalafil) Do not take nitrates (isosorbide mononitrate, Ranexa) the day before or day of test Do not take tamsulosin the day before or morning of test Hold theophylline containing medications for 12 hours. Hold Dipyridamole 48 hours prior to the test.  Diabetic Preparation: If able to eat breakfast prior to 3 hour fasting, you may take all medications, including your insulin. Do not worry if you miss your breakfast dose of insulin - start at your next meal. If you do not eat prior to 3 hour fast-Hold all diabetes (oral and insulin) medications. Patients who wear a continuous glucose monitor MUST remove the device prior to scanning.  You may take your remaining medications with water .  NO perfume, cologne or lotion on chest or abdomen area. FEMALES - Please avoid wearing dresses to this appointment.  Total time is 1 to 2 hours; you may want to bring reading material for the waiting time.  IF YOU THINK YOU MAY BE PREGNANT, OR ARE NURSING PLEASE INFORM THE TECHNOLOGIST.  In preparation for your appointment, medication and supplies will be purchased.  Appointment availability is limited, so if you need to cancel or reschedule, please call the Radiology Department Scheduler at [phone] 24 hours in advance to avoid a cancellation fee of $100.00  What to Expect When you Arrive:  Once you arrive and check in for your appointment,  you will be taken to a preparation room within the Radiology Department.  A technologist or Nurse will obtain your medical history, verify that you are correctly prepped for the exam, and explain the procedure.  Afterwards, an IV will be started in your arm and electrodes will be placed on your skin for EKG monitoring during the stress portion of the exam. Then you will be escorted to the PET/CT scanner.  There, staff will get you positioned on the scanner and obtain a  blood pressure and EKG.  During the exam, you will continue to be connected to the EKG and blood pressure machines.  A small, safe amount of a radioactive tracer will be injected in your IV to obtain a series of pictures of your heart along with an injection of a stress agent.    After your Exam:  It is recommended that you eat a meal and drink a caffeinated beverage to counter act any effects of the stress agent.  Drink plenty of fluids for the remainder of the day and urinate frequently for the first couple of hours after the exam.  Your doctor will inform you of your test results within 7-10 business days.  For more information and frequently asked questions, please visit our website: [email or web address]  For questions about your test or how to prepare for your test, please call: Cardiac Imaging Nurse Navigators Office: [phone]

## 2024-07-14 NOTE — Assessment & Plan Note (Signed)
 Zio patch has already been ordered by PCP ostensibly for the palpitations but would also like to see if there is any junctional rhythms.  Unfortunate will have the results or the documentation.  We will send off for the study report to review personally.SABRA

## 2024-07-14 NOTE — Progress Notes (Unsigned)
 6 Cardiology Office Note:  .   Date:  07/17/2024  ID:  [name], DOB [birth year], MRN [id number] PCP: Georgina Speaks, FNP  [facility] Providers Cardiologist:  Alm Clay, MD     Chief Complaint  Patient presents with   Follow-up    Notes palpitations, chest pain dyspnea.   Coronary Artery Disease   Palpitations    Patient Profile: .     Peggy [name] is a moderately obese 78 y.o. female  with a PMH noted below who presents here for annual f/u at the request of Candis Bloch (from [facility]).  PMH notable for HTN, nonobstructive CAD (nonobstructive disease By Cath in 2005 in 2012 and Coronary CTA with moderate disease, nonischemic Myoview  April 2022), PSVT, DDD, HLD, MSK related chest pain      Peggy [name] was last seen on June 18, 2023 as a 60-month follow-up.  She still noted some exertional dyspnea with some orthopnea and PND controlled with Lasix .  No chest pain.  Still has some left-sided leg weakness and swelling from her left knee surgery.  She was seen at [facility], ER on 04/30/2024 for atypical chest pain.  Referred from her primary care.  Apparently she was having chest pain coming and going over the last 2 weeks left-sided aching lasting for seconds.  On arrival to the ER she denies chest pain.  Subjective  Discussed the use of AI scribe software for clinical note transcription with the patient, who gave verbal consent to proceed.  History of Present Illness Peggy [name] is an 78 year old female who presents with heart symptoms. She is accompanied by her son. She was referred by [facility] for evaluation of her heart symptoms.  In August, she experienced chest pain and arm pain while at [facility], which led to her being given nitroglycerin  and subsequently sent to the emergency room. The episode lasted about 20-25 minutes. Since then, she has had episodes of heart racing and occasional chest pain, which she manages with  nitroglycerin . The chest pain typically resolves within 5-9 minutes after taking nitroglycerin .  She experiences shortness of breath with activities and sometimes while sitting. Walking exacerbates the shortness of breath, and she often needs to rest. At night, she sleeps with her head elevated due to heart racing when lying flat and occasionally wakes up at night feeling short of breath.  She reports swelling in her left knee, which she attributes to knee issues, and takes furosemide  daily. No new swelling in her legs.  She wore a heart monitor a couple of weeks ago, which was returned to [facility], but she has not yet received the results. Previous cardiac evaluations include a stress test in 2022, an echocardiogram in 2021, and a coronary CT scan in 2019.  Her current medications include amlodipine  2.5 mg at night, Zetia  10 mg at night, Zocor  80 mg, and metoprolol  25 mg in the morning.  Cardiovascular ROS: positive for - chest pain, dyspnea on exertion, orthopnea, palpitations, paroxysmal nocturnal dyspnea, shortness of breath, and these are not consistent symptoms but certainly occur with notable frequency negative for - loss of consciousness, rapid heart rate, or syncope or near syncope TIA or emesis to gas.  Claudication.  Melena, hematochezia, hematuria or epistaxis.  Claudication.  ROS:  Review of Systems - Negative except not noted above    Objective   Cardiovascular Meds  Medication Sig   amLODipine  (NORVASC ) 2.5 MG tablet  Take 1 tablet (2.5 mg total) by mouth daily. (Patient taking differently: Take 2.5 mg by mouth at bedtime.)   ezetimibe  (ZETIA ) 10 MG tablet Take 1 tablet (10 mg total) by mouth daily.   furosemide  (LASIX ) 40 MG tablet TAKE 1 TABLET(40 MG) BY MOUTH DAILY   metoprolol  succinate (TOPROL -XL) 25 MG 24 hr tablet TAKE 1 TABLET(25 MG) BY MOUTH DAILY   nitroGLYCERIN  (NITROSTAT ) 0.4 MG SL tablet PLACE 1 TABLET UNDER TONGUE EVERY 5 MINUTES AS NEEDED FOR CHEST PAIN    simvastatin  (ZOCOR ) 80 MG tablet TAKE 1 TABLET(80 MG) BY MOUTH AT BEDTIME   trandolapril  (MAVIK ) 2 MG tablet TAKE 1 TABLET(2 MG) BY MOUTH DAILY  Non CV Meds  Medication Sig   acetaminophen  (TYLENOL ) 500 MG tablet Take 1,000 mg by mouth 2 (two) times daily as needed for headache or fever (pain).   diazepam  (VALIUM ) 5 MG tablet Take 5 mg by mouth every 6 (six) hours as needed for anxiety.   diphenhydrAMINE  (BENADRYL ) 25 mg capsule Take 25 mg by mouth daily as needed for allergies.   FLUoxetine  (PROZAC ) 40 MG capsule Take 40 mg by mouth daily as needed (depression).   hydrocortisone  cream 1 % Apply to affected area 2 times daily (Patient taking differently: Apply 1 Application topically 2 (two) times daily as needed (eczema).)   loratadine  (CLARITIN ) 10 MG tablet Take 1 tablet (10 mg total) by mouth daily. (Patient taking differently: Take 10 mg by mouth daily as needed for allergies.)   valACYclovir  (VALTREX ) 1000 MG tablet TAKE 1/2 TABLET BY MOUTH THREE TIMES DAILY AS NEEDED FOR OUTBREAK   Studies Reviewed: Peggy [name]   EKG Interpretation Date/Time:  Monday July 14 2024 09:05:09 EST Ventricular Rate:  70 PR Interval:    QRS Duration:  78 QT Interval:  404 QTC Calculation: 436 R Axis:   16  Text Interpretation: Sinus rhythm with a competing junctional pacemaker Low voltage QRS When compared with ECG of 30-Apr-2024 10:43, a competing junctional pacemaker NOW PRESENT Confirmed by Anner Lenis ([id number]) on 07/14/2024 9:14:57 AM   Discussed with Dr. 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  Computer read as A-fib, but segments are normal and not irregular.  Lab Results  Component Value Date   CHOL 158 12/24/2023   HDL 64 12/24/2023   LDLCALC 79 12/24/2023   TRIG 77 12/24/2023   CHOLHDL 2.5 12/24/2023   Lab Results  Component Value Date   NA 140 04/30/2024   K 3.4 (L) 04/30/2024   CREATININE 1.27 (H)  04/30/2024   GFRNONAA 43 (L) 04/30/2024   GLUCOSE 83 04/30/2024   Lab Results  Component Value Date   WBC 9.1 04/30/2024   HGB 10.1 (L) 04/30/2024   HCT 34.5 (L) 04/30/2024   MCV 84.6 04/30/2024   PLT 331 04/30/2024   Lab Results  Component Value Date   HGBA1C 5.5 04/05/2021    Previous Studies ECHO (01/06/2020): Normal LV size and function.  Mild LVH.  EF 60 to 65%-no RWMA.  GR 1 DD.  Essentially normal valves.  Normal RAP and RAP. Myoview  (12/09/2020): Normal LV EF 55 to 65%.  No EKG changes.  LOW RISK/NORMAL STUDY Carotid Dopplers (10/07/2020): 1 - 40% R ICA.  LICA disease.  Normal vertebral and subclavian arteries. Lower Extremity Venous Dopplers: No DVT Cardiac Catheterization (2005 and 2012) showed nonocclusive CAD Coronary CTA (11/12/2017): CAC 131.  Mild proximal LAD stenosis and mild proximal LCx stenosis.  Small  pericardial effusion.  (  Risk Assessment/Calculations:        Physical Exam:   VS:  BP (!) 104/40   Pulse 70   Ht 5' 5 (1.651 m)   Wt 209 lb 3.2 oz (94.9 kg)   SpO2 97%   BMI 34.81 kg/m    Wt Readings from Last 3 Encounters:  07/14/24 209 lb 3.2 oz (94.9 kg)  04/30/24 209 lb (94.8 kg)  12/24/23 214 lb 12.8 oz (97.4 kg)      GEN: Well nourished, well groomed, in no acute distress; otherwise healthy appearing. NECK: No JVD; No carotid bruits CARDIAC: Normal S1, S2; RRR, 1/6 SEM at RUSB.  No additional murmurs, rubs, gallops RESPIRATORY:  Clear to auscultation without rales, wheezing or rhonchi ; nonlabored, good air movement. ABDOMEN: Soft, non-tender, non-distended MSK/EXTR: Trivial ankle edema; No deformity ; costochondral tenderness to palpation bilaterally.     ASSESSMENT AND PLAN: .    Problem List Items Addressed This Visit       Cardiology Problems   Atherosclerosis of aorta   Relevant Orders   EKG 12-Lead (Completed)   Coronary artery calcification seen on CAT scan (Chronic)   Atherosclerotic heart disease of native coronary artery with  intermittent chest discomfort and evaluation for microvascular angina Intermittent chest discomfort relieved by nitroglycerin . Previous evaluations unremarkable. Differential includes microvascular angina.  Will Plan for Cardiac Stress PET test to evaluate for a either micro-or macro-vascular disease  - Order cardiac stress PET test to evaluate for small artery disease. - Continue current medications: Is on amlodipine  and Toprol  at low-dose for antianginal and blood pressure effect along with combination of Zetia  and simvastatin  for lipid management. - With CAC score of less than 300, the consensus was that she would hold off on aspirin  prophylaxis.      Relevant Orders   NM PET CT CARDIAC PERFUSION MULTI W/ABSOLUTE BLOODFLOW   Cardiac Stress Test: Informed Consent Details: Physician/Practitioner Attestation; Transcribe to consent form and obtain patient signature   Coronary artery disease, non-occlusive - Primary (Chronic)   Relevant Orders   EKG 12-Lead (Completed)   NM PET CT CARDIAC PERFUSION MULTI W/ABSOLUTE BLOODFLOW   Cardiac Stress Test: Informed Consent Details: Physician/Practitioner Attestation; Transcribe to consent form and obtain patient signature   Essential hypertension (Chronic)   Essential hypertension, well-controlled on current therapy Continue current antihypertensive regimen: Amlodipine  2.5 mg p.m., metoprolol  succinate 25 mg a.m., trandolapril  2 mg daily; Lasix  40 mg daily      Relevant Orders   EKG 12-Lead (Completed)   Hyperlipidemia with target LDL less than 100 (Chronic)   On combination of Zetia  10 mg daily and simvastatin  80 mg daily. Most recent lipid panel shows an LDL of 79.  Relatively reasonable based on coronary Score.  Would adjust pending results of stress PET.      Junctional escape rhythm (HCC) (Chronic)   Zio patch has already been ordered by PCP ostensibly for the palpitations but would also like to see if there is any junctional  rhythms.  Unfortunate will have the results or the documentation.  We will send off for the study report to review personally.Peggy [name]      PSVT (paroxysmal supraventricular tachycardia) (Chronic)   She has a history of short burst of SVT but does not seem to be noticing them as frequently but also has junctional escape rhythms and PACs. Apparently she had a monitor ordered By Her PCP at [facility] unfortunately do not have the results available  to review.  Will try to get the results.  Symptomatic palpitations with heart racing. Current EKG shows irregularities. Awaiting Holter monitor results for further evaluation. - Will obtain results of Holter monitor from [facility].      Relevant Orders   EKG 12-Lead (Completed)     Other   Bilateral carotid bruits (Chronic)   She had carotid Doppler done February 2022 that were relatively normal.  I think we can hold off unless symptoms change.      Bilateral lower extremity edema (Chronic)   She is on standing dose of furosemide  but is not taking any additional doses.  She does have some PND orthopnea symptoms.  I like to see what her EF is on stress PET, if abnormal would opt to also order 2D echo.      Chest pain with moderate risk for cardiac etiology (Chronic)   Chronic chest pain.  She still has these episodes and we have not evaluated her in a while.  Would like to see if there is any evidence of microvascular disease as well as potential for macrovascular disease. Plan: Order Cardiac Stress PET      Relevant Orders   Cardiac Stress Test: Informed Consent Details: Physician/Practitioner Attestation; Transcribe to consent form and obtain patient signature   Dyspnea on exertion   Shortness of breath and fatigue, possibly related to cardiac issues or musculoskeletal pain. Discussed musculoskeletal pain as a potential contributor. - Evaluating for cardiac etiology with Cardiac Stress PET - Continue to monitor symptoms and  evaluate further if necessary.      Orthopnea (Chronic)   Also not a new finding.  She remains on adequate afterload reduction beta-blocker calcium  blocker as well as diuretic.  Does not seem to be overly limiting.  Reassess EF by stress PET      Systolic ejection murmur (Chronic)   No obvious abnormality noted on echocardiogram back in 2021.  Most likely aortic sclerosis or perhaps some mild LVOT gradient, simply based on location.          Informed Consent   Shared Decision Making/Informed Consent: The risks [chest pain, shortness of breath, cardiac arrhythmias, dizziness, blood pressure fluctuations, myocardial infarction, stroke/transient ischemic attack, nausea, vomiting, allergic reaction, radiation exposure, metallic taste sensation and life-threatening complications (estimated to be 1 in 10,000)], benefits (risk stratification, diagnosing coronary artery disease, treatment guidance) and alternatives of a cardiac PET stress test were discussed in detail with Ms. Dasie and she agrees to proceed.      Follow-Up: Return in about 4 months (around 11/11/2024).  I spent 48 minutes in the care of Peggy [name] today including reviewing labs (1 minute), reviewing studies (reviewed all cardiac studies-5 minutes), reviewing outside studies (N/A), face to face time discussing treatment options (23 minutes), reviewing records from Last visit, PCP note, ER visit (7 minutes), 12 minutes dictating, and documenting in the encounter.      Signed, Alm MICAEL Clay, MD, MS Alm Clay, M.D., M.S. Interventional Cardiologist  [facility] Pager # [phone]

## 2024-07-16 ENCOUNTER — Other Ambulatory Visit: Payer: Self-pay | Admitting: Cardiology

## 2024-07-16 ENCOUNTER — Encounter: Payer: Self-pay | Admitting: Cardiology

## 2024-07-16 DIAGNOSIS — R0609 Other forms of dyspnea: Secondary | ICD-10-CM | POA: Insufficient documentation

## 2024-07-16 NOTE — Assessment & Plan Note (Signed)
 On combination of Zetia  10 mg daily and simvastatin  80 mg daily. Most recent lipid panel shows an LDL of 79.  Relatively reasonable based on coronary Score.  Would adjust pending results of stress PET.

## 2024-07-16 NOTE — Assessment & Plan Note (Signed)
 No obvious abnormality noted on echocardiogram back in 2021.  Most likely aortic sclerosis or perhaps some mild LVOT gradient, simply based on location.

## 2024-07-16 NOTE — Assessment & Plan Note (Signed)
 Essential hypertension, well-controlled on current therapy Continue current antihypertensive regimen: Amlodipine  2.5 mg p.m., metoprolol  succinate 25 mg a.m., trandolapril  2 mg daily; Lasix  40 mg daily

## 2024-07-16 NOTE — Assessment & Plan Note (Signed)
 Atherosclerotic heart disease of native coronary artery with intermittent chest discomfort and evaluation for microvascular angina Intermittent chest discomfort relieved by nitroglycerin . Previous evaluations unremarkable. Differential includes microvascular angina.  Will Plan for Cardiac Stress PET test to evaluate for a either micro-or macro-vascular disease  - Order cardiac stress PET test to evaluate for small artery disease. - Continue current medications: Is on amlodipine  and Toprol  at low-dose for antianginal and blood pressure effect along with combination of Zetia  and simvastatin  for lipid management. - With CAC score of less than 300, the consensus was that she would hold off on aspirin  prophylaxis.

## 2024-07-16 NOTE — Assessment & Plan Note (Signed)
 She is on standing dose of furosemide  but is not taking any additional doses.  She does have some PND orthopnea symptoms.  I like to see what her EF is on stress PET, if abnormal would opt to also order 2D echo.

## 2024-07-16 NOTE — Assessment & Plan Note (Signed)
 Chronic chest pain.  She still has these episodes and we have not evaluated her in a while.  Would like to see if there is any evidence of microvascular disease as well as potential for macrovascular disease. Plan: Order Cardiac Stress PET

## 2024-07-16 NOTE — Assessment & Plan Note (Signed)
 She has a history of short burst of SVT but does not seem to be noticing them as frequently but also has junctional escape rhythms and PACs. Apparently she had a monitor ordered By Her PCP at [facility] unfortunately do not have the results available to review.  Will try to get the results.  Symptomatic palpitations with heart racing. Current EKG shows irregularities. Awaiting Holter monitor results for further evaluation. - Will obtain results of Holter monitor from [facility].

## 2024-07-16 NOTE — Assessment & Plan Note (Signed)
 She had carotid Doppler done February 2022 that were relatively normal.  I think we can hold off unless symptoms change.

## 2024-07-16 NOTE — Assessment & Plan Note (Signed)
 Also not a new finding.  She remains on adequate afterload reduction beta-blocker calcium  blocker as well as diuretic.  Does not seem to be overly limiting.  Reassess EF by stress PET

## 2024-07-17 NOTE — Assessment & Plan Note (Signed)
 Shortness of breath and fatigue, possibly related to cardiac issues or musculoskeletal pain. Discussed musculoskeletal pain as a potential contributor. - Evaluating for cardiac etiology with Cardiac Stress PET - Continue to monitor symptoms and evaluate further if necessary.

## 2024-07-30 ENCOUNTER — Telehealth: Payer: Self-pay | Admitting: Cardiology

## 2024-07-30 MED ORDER — AMLODIPINE BESYLATE 2.5 MG PO TABS: 2.5000 mg | ORAL_TABLET | Freq: Every day | ORAL | 3 refills | Status: AC

## 2024-07-30 MED ORDER — EZETIMIBE 10 MG PO TABS: 10.0000 mg | ORAL_TABLET | Freq: Every day | ORAL | 3 refills | Status: AC

## 2024-07-30 MED ORDER — FUROSEMIDE 40 MG PO TABS: 40.0000 mg | ORAL_TABLET | Freq: Every day | ORAL | 2 refills | Status: AC

## 2024-07-30 NOTE — Telephone Encounter (Signed)
*  STAT* If patient is at the pharmacy, call can be transferred to refill team.   1. Which medications need to be refilled? (please list name of each medication and dose if known)   amLODipine  (NORVASC ) 2.5 MG tablet    ezetimibe  (ZETIA ) 10 MG tablet  furosemide  (LASIX ) 40 MG tablet   4. Which pharmacy/location (including street and city if local pharmacy) is medication to be sent to?  [id number] - [place], [place] - [address]     5. Do they need a 30 day or 90 day supply? 90

## 2024-07-30 NOTE — Telephone Encounter (Signed)
Refills has been sent to pharmacy. 

## 2024-08-18 ENCOUNTER — Ambulatory Visit: Admitting: Physician Assistant

## 2024-08-25 ENCOUNTER — Telehealth (HOSPITAL_COMMUNITY): Payer: Self-pay | Admitting: Emergency Medicine

## 2024-08-25 ENCOUNTER — Telehealth (HOSPITAL_COMMUNITY): Payer: Self-pay | Admitting: *Deleted

## 2024-08-25 NOTE — Telephone Encounter (Signed)
Attempted to call patient regarding upcoming cardiac PET appointment. Left message on voicemail with name and callback number Aidan Moten RN Navigator Cardiac Imaging [facility] [phone] Office [phone] Cell  

## 2024-08-25 NOTE — Telephone Encounter (Signed)
 Patient calling about her upcoming cardiac imaging study; pt verbalizes understanding of appt date/time, parking situation and where to check in, pre-test NPO status, and verified current allergies; name and call back number provided for further questions should they arise  Chantal Requena RN Navigator Cardiac Imaging [facility] [phone] office [phone] cell  Patient aware to avoid caffeine 12 hours prior to her cardiac PET study.

## 2024-08-26 ENCOUNTER — Telehealth: Payer: Self-pay | Admitting: Cardiology

## 2024-08-26 ENCOUNTER — Ambulatory Visit (HOSPITAL_COMMUNITY)
Admission: RE | Admit: 2024-08-26 | Discharge: 2024-08-26 | Disposition: A | Discharge status: Home / Self Care | Source: Ambulatory Visit | Attending: Cardiology | Admitting: Cardiology

## 2024-08-26 ENCOUNTER — Ambulatory Visit: Payer: Self-pay | Admitting: Cardiology

## 2024-08-26 DIAGNOSIS — I2510 Atherosclerotic heart disease of native coronary artery without angina pectoris: Secondary | ICD-10-CM | POA: Diagnosis not present

## 2024-08-26 LAB — NM PET CT CARDIAC PERFUSION MULTI W/ABSOLUTE BLOODFLOW
LV dias vol: 127 mL (ref 46–106)
MBFR: 1.76
Nuc Rest EF: 65 %
Nuc Stress EF: 66 %
Peak HR: 95 {beats}/min
Rest HR: 81 {beats}/min
Rest MBF: 1.01 ml/g/min
Rest Nuclear Isotope Dose: 24.7 mCi
ST Depression (mm): 0 mm
Stress MBF: 1.78 ml/g/min
Stress Nuclear Isotope Dose: 24.7 mCi
TID: 0.96

## 2024-08-26 MED ORDER — REGADENOSON 0.4 MG/5ML IV SOLN
0.4000 mg | Freq: Once | INTRAVENOUS | Status: AC
  Administered 2024-08-26: 0.4 mg via INTRAVENOUS

## 2024-08-26 MED ORDER — RUBIDIUM RB82 GENERATOR (RUBYFILL)
24.7200 | PACK | Freq: Once | INTRAVENOUS | Status: AC
  Administered 2024-08-26: 24.72 via INTRAVENOUS

## 2024-08-26 MED ORDER — RUBIDIUM RB82 GENERATOR (RUBYFILL)
24.6000 | PACK | Freq: Once | INTRAVENOUS | Status: AC
  Administered 2024-08-26: 24.6 via INTRAVENOUS

## 2024-08-26 MED ORDER — REGADENOSON 0.4 MG/5ML IV SOLN
INTRAVENOUS | Status: AC
  Filled 2024-08-26: qty 5

## 2024-08-26 NOTE — Progress Notes (Signed)
 No indication of severe narrowing in major arteries, mild reduced blood flow to heart muscle likely due to small vessel.  Continue current medications.  Keep follow-up with Dr. Anner in March 2026.  Thanks MJP

## 2024-08-26 NOTE — Telephone Encounter (Signed)
 Patient returning call for results

## 2024-08-26 NOTE — Telephone Encounter (Signed)
 Returned patient's call, no answer. LVM per DPR asking patient to call our office to get test results.

## 2024-08-26 NOTE — Progress Notes (Signed)
 Pt. Tolerated lexi scan well.

## 2024-08-27 NOTE — Telephone Encounter (Signed)
 The patient has been notified of the result and verbalized understanding.  All questions (if any) were answered. Aleck LOISE Bill, RN 08/27/2024 9:09 AM

## 2024-08-27 NOTE — Telephone Encounter (Signed)
 Pt called to f/u on results please advise

## 2024-10-15 ENCOUNTER — Ambulatory Visit: Payer: Self-pay

## 2024-11-12 ENCOUNTER — Ambulatory Visit: Admitting: Cardiology

## 2025-04-01 ENCOUNTER — Ambulatory Visit: Admitting: Physician Assistant
